# Patient Record
Sex: Female | Born: 1990 | Race: White | Hispanic: No | Marital: Single | Smoking: Never smoker
Health system: Southern US, Community
[De-identification: ages and names within clinical notes are randomized; demographics above are authoritative.]

## PROBLEM LIST (undated history)

## (undated) DIAGNOSIS — L210 Seborrhea capitis: Secondary | ICD-10-CM

## (undated) DIAGNOSIS — I10 Essential (primary) hypertension: Secondary | ICD-10-CM

## (undated) DIAGNOSIS — E663 Overweight: Secondary | ICD-10-CM

## (undated) DIAGNOSIS — F419 Anxiety disorder, unspecified: Secondary | ICD-10-CM

## (undated) DIAGNOSIS — E119 Type 2 diabetes mellitus without complications: Secondary | ICD-10-CM

## (undated) DIAGNOSIS — R7303 Prediabetes: Secondary | ICD-10-CM

## (undated) DIAGNOSIS — T8859XA Other complications of anesthesia, initial encounter: Secondary | ICD-10-CM

## (undated) DIAGNOSIS — M3313 Other dermatomyositis without myopathy: Secondary | ICD-10-CM

## (undated) DIAGNOSIS — F39 Unspecified mood [affective] disorder: Secondary | ICD-10-CM

## (undated) DIAGNOSIS — E039 Hypothyroidism, unspecified: Secondary | ICD-10-CM

## (undated) DIAGNOSIS — IMO0001 Reserved for inherently not codable concepts without codable children: Secondary | ICD-10-CM

## (undated) DIAGNOSIS — R030 Elevated blood-pressure reading, without diagnosis of hypertension: Secondary | ICD-10-CM

## (undated) DIAGNOSIS — Z309 Encounter for contraceptive management, unspecified: Secondary | ICD-10-CM

## (undated) DIAGNOSIS — F79 Unspecified intellectual disabilities: Secondary | ICD-10-CM

## (undated) DIAGNOSIS — Q211 Atrial septal defect, unspecified: Secondary | ICD-10-CM

## (undated) DIAGNOSIS — Q0700 Arnold-Chiari syndrome without spina bifida or hydrocephalus: Secondary | ICD-10-CM

## (undated) DIAGNOSIS — M3390 Dermatopolymyositis, unspecified, organ involvement unspecified: Secondary | ICD-10-CM

## (undated) DIAGNOSIS — R102 Pelvic and perineal pain unspecified side: Secondary | ICD-10-CM

## (undated) DIAGNOSIS — T4145XA Adverse effect of unspecified anesthetic, initial encounter: Secondary | ICD-10-CM

## (undated) HISTORY — DX: Unspecified intellectual disabilities: F79

## (undated) HISTORY — DX: Anxiety disorder, unspecified: F41.9

## (undated) HISTORY — DX: Seborrhea capitis: L21.0

## (undated) HISTORY — DX: Reserved for inherently not codable concepts without codable children: IMO0001

## (undated) HISTORY — DX: Other dermatomyositis without myopathy: M33.13

## (undated) HISTORY — DX: Atrial septal defect: Q21.1

## (undated) HISTORY — DX: Unspecified mood (affective) disorder: F39

## (undated) HISTORY — DX: Overweight: E66.3

## (undated) HISTORY — DX: Encounter for contraceptive management, unspecified: Z30.9

## (undated) HISTORY — DX: Hypothyroidism, unspecified: E03.9

## (undated) HISTORY — DX: Arnold-Chiari syndrome without spina bifida or hydrocephalus: Q07.00

## (undated) HISTORY — DX: Pelvic and perineal pain: R10.2

## (undated) HISTORY — DX: Essential (primary) hypertension: I10

## (undated) HISTORY — DX: Elevated blood-pressure reading, without diagnosis of hypertension: R03.0

## (undated) HISTORY — DX: Type 2 diabetes mellitus without complications: E11.9

## (undated) HISTORY — DX: Prediabetes: R73.03

## (undated) HISTORY — DX: Dermatopolymyositis, unspecified, organ involvement unspecified: M33.90

## (undated) HISTORY — DX: Atrial septal defect, unspecified: Q21.10

## (undated) HISTORY — DX: Pelvic and perineal pain unspecified side: R10.20

## (undated) HISTORY — PX: ASD REPAIR: SHX258

---

## 2009-04-21 ENCOUNTER — Inpatient Hospital Stay: Payer: Self-pay | Admitting: Psychiatry

## 2009-04-26 ENCOUNTER — Emergency Department: Payer: Self-pay | Admitting: Emergency Medicine

## 2009-05-04 ENCOUNTER — Emergency Department: Payer: Self-pay | Admitting: Emergency Medicine

## 2009-06-07 ENCOUNTER — Emergency Department: Payer: Self-pay | Admitting: Unknown Physician Specialty

## 2010-08-05 ENCOUNTER — Emergency Department: Payer: Self-pay | Admitting: Emergency Medicine

## 2010-08-28 ENCOUNTER — Emergency Department: Payer: Self-pay | Admitting: Emergency Medicine

## 2010-10-25 ENCOUNTER — Emergency Department: Payer: Self-pay | Admitting: Emergency Medicine

## 2010-12-14 ENCOUNTER — Inpatient Hospital Stay: Payer: Self-pay | Admitting: Unknown Physician Specialty

## 2010-12-23 ENCOUNTER — Emergency Department: Payer: Self-pay | Admitting: Emergency Medicine

## 2011-01-12 ENCOUNTER — Ambulatory Visit: Payer: Self-pay | Admitting: Internal Medicine

## 2011-02-07 ENCOUNTER — Emergency Department: Payer: Self-pay | Admitting: Emergency Medicine

## 2011-02-09 ENCOUNTER — Emergency Department: Payer: Self-pay | Admitting: Emergency Medicine

## 2011-02-14 ENCOUNTER — Emergency Department: Payer: Self-pay | Admitting: Emergency Medicine

## 2011-02-19 ENCOUNTER — Emergency Department: Payer: Self-pay | Admitting: *Deleted

## 2011-08-02 ENCOUNTER — Inpatient Hospital Stay: Payer: Self-pay | Admitting: Psychiatry

## 2011-08-02 LAB — DRUG SCREEN, URINE

## 2011-08-02 LAB — COMPREHENSIVE METABOLIC PANEL
Albumin: 3.2 g/dL — ABNORMAL LOW (ref 3.4–5.0)
Alkaline Phosphatase: 116 U/L (ref 50–136)
Anion Gap: 10 (ref 7–16)
BUN: 7 mg/dL (ref 7–18)
Bilirubin,Total: 0.2 mg/dL (ref 0.2–1.0)
Calcium, Total: 8.6 mg/dL (ref 8.5–10.1)
Chloride: 104 mmol/L (ref 98–107)
Co2: 25 mmol/L (ref 21–32)
Creatinine: 0.65 mg/dL (ref 0.60–1.30)
EGFR (African American): >60
EGFR (Non-African Amer.): >60
Glucose: 92 mg/dL (ref 65–99)
Osmolality: 275 (ref 275–301)
Potassium: 3.6 mmol/L (ref 3.5–5.1)
SGOT(AST): 18 U/L (ref 15–37)
SGPT (ALT): 24 U/L
Sodium: 139 mmol/L (ref 136–145)
Total Protein: 7.8 g/dL (ref 6.4–8.2)

## 2011-08-02 LAB — ACETAMINOPHEN LEVEL: Acetaminophen: <2 ug/mL

## 2011-08-02 LAB — CBC
HCT: 36 % (ref 35.0–47.0)
HGB: 11.9 g/dL — ABNORMAL LOW (ref 12.0–16.0)
MCH: 27.8 pg (ref 26.0–34.0)
MCHC: 33.1 g/dL (ref 32.0–36.0)
MCV: 84 fL (ref 80–100)
Platelet: 304 10*3/uL (ref 150–440)
RBC: 4.29 10*6/uL (ref 3.80–5.20)
RDW: 14 % (ref 11.5–14.5)
WBC: 8.9 10*3/uL (ref 3.6–11.0)

## 2011-08-02 LAB — ETHANOL
Ethanol %: <0.003 % (ref 0.000–0.080)
Ethanol: <3 mg/dL

## 2011-08-02 LAB — CARBAMAZEPINE LEVEL, TOTAL: Carbamazepine: 12.4 ug/mL (ref 4.0–12.0)

## 2011-08-02 LAB — SALICYLATE LEVEL: Salicylates, Serum: <1.7 mg/dL

## 2011-08-02 LAB — TSH: Thyroid Stimulating Horm: 0.46 u[IU]/mL

## 2011-08-03 LAB — LIPID PANEL
Cholesterol: 153 mg/dL (ref 0–200)
HDL Cholesterol: 42 mg/dL (ref 40–60)
Ldl Cholesterol, Calc: 89 mg/dL (ref 0–100)
Triglycerides: 108 mg/dL (ref 0–200)
VLDL Cholesterol, Calc: 22 mg/dL (ref 5–40)

## 2011-08-03 LAB — HEMOGLOBIN A1C: Hemoglobin A1C: 6.5 % — ABNORMAL HIGH (ref 4.2–6.3)

## 2011-08-03 LAB — FOLATE: Folic Acid: 17.2 ng/mL (ref 3.1–100.0)

## 2011-08-04 ENCOUNTER — Encounter: Payer: Self-pay | Admitting: Cardiovascular Disease

## 2011-08-04 ENCOUNTER — Encounter: Payer: Self-pay | Admitting: *Deleted

## 2011-08-04 DIAGNOSIS — I369 Nonrheumatic tricuspid valve disorder, unspecified: Secondary | ICD-10-CM

## 2011-08-04 LAB — URINALYSIS, COMPLETE
Bilirubin,UR: NEGATIVE
Glucose,UR: NEGATIVE mg/dL (ref 0–75)
Ketone: NEGATIVE
Nitrite: NEGATIVE
Ph: 7 (ref 4.5–8.0)
Protein: NEGATIVE
RBC,UR: 4 /HPF (ref 0–5)
Specific Gravity: 1.013 (ref 1.003–1.030)
Squamous Epithelial: 3
WBC UR: 12 /HPF (ref 0–5)

## 2011-08-05 LAB — T4, FREE: Free Thyroxine: 1.27 ng/dL (ref 0.76–1.46)

## 2011-08-06 LAB — BASIC METABOLIC PANEL
Anion Gap: 11 (ref 7–16)
BUN: 9 mg/dL (ref 7–18)
Calcium, Total: 8.6 mg/dL (ref 8.5–10.1)
Chloride: 95 mmol/L — ABNORMAL LOW (ref 98–107)
Co2: 27 mmol/L (ref 21–32)
Creatinine: 0.68 mg/dL (ref 0.60–1.30)
EGFR (African American): >60
EGFR (Non-African Amer.): >60
Glucose: 114 mg/dL — ABNORMAL HIGH (ref 65–99)
Osmolality: 266 (ref 275–301)
Potassium: 3.2 mmol/L — ABNORMAL LOW (ref 3.5–5.1)
Sodium: 133 mmol/L — ABNORMAL LOW (ref 136–145)

## 2011-08-07 LAB — PROTEIN, URINE, RANDOM: Protein, Random Urine: 12 mg/dL (ref 0–12)

## 2011-08-07 LAB — SODIUM, URINE, RANDOM: Sodium, Urine Random: 80 mmol/L (ref 20–110)

## 2011-08-08 ENCOUNTER — Ambulatory Visit: Payer: Self-pay | Admitting: Cardiovascular Disease

## 2011-08-16 LAB — DRUG SCREEN, URINE

## 2011-08-16 LAB — COMPREHENSIVE METABOLIC PANEL
Albumin: 3.4 g/dL (ref 3.4–5.0)
Alkaline Phosphatase: 107 U/L (ref 50–136)
Anion Gap: 8 (ref 7–16)
BUN: 10 mg/dL (ref 7–18)
Bilirubin,Total: 0.2 mg/dL (ref 0.2–1.0)
Calcium, Total: 8.8 mg/dL (ref 8.5–10.1)
Chloride: 101 mmol/L (ref 98–107)
Co2: 26 mmol/L (ref 21–32)
Creatinine: 0.89 mg/dL (ref 0.60–1.30)
EGFR (African American): >60
EGFR (Non-African Amer.): >60
Glucose: 115 mg/dL — ABNORMAL HIGH (ref 65–99)
Osmolality: 270 (ref 275–301)
Potassium: 3.7 mmol/L (ref 3.5–5.1)
SGOT(AST): 19 U/L (ref 15–37)
SGPT (ALT): 21 U/L
Sodium: 135 mmol/L — ABNORMAL LOW (ref 136–145)
Total Protein: 7.9 g/dL (ref 6.4–8.2)

## 2011-08-16 LAB — URINALYSIS, COMPLETE
Bilirubin,UR: NEGATIVE
Blood: NEGATIVE
Glucose,UR: NEGATIVE mg/dL (ref 0–75)
Hyaline Cast: 1
Ketone: NEGATIVE
Leukocyte Esterase: NEGATIVE
Nitrite: NEGATIVE
Ph: 6 (ref 4.5–8.0)
Protein: 30
RBC,UR: 8 /HPF (ref 0–5)
Specific Gravity: 1.016 (ref 1.003–1.030)
Squamous Epithelial: 1
WBC UR: 5 /HPF (ref 0–5)

## 2011-08-16 LAB — CBC
HCT: 35.2 % (ref 35.0–47.0)
HGB: 11.1 g/dL — ABNORMAL LOW (ref 12.0–16.0)
MCH: 26.6 pg (ref 26.0–34.0)
MCHC: 31.6 g/dL — ABNORMAL LOW (ref 32.0–36.0)
MCV: 84 fL (ref 80–100)
Platelet: 320 10*3/uL (ref 150–440)
RBC: 4.18 10*6/uL (ref 3.80–5.20)
RDW: 14.5 % (ref 11.5–14.5)
WBC: 10.1 10*3/uL (ref 3.6–11.0)

## 2011-08-16 LAB — SALICYLATE LEVEL: Salicylates, Serum: <1.7 mg/dL

## 2011-08-16 LAB — ETHANOL
Ethanol %: <0.003 % (ref 0.000–0.080)
Ethanol: <3 mg/dL

## 2011-08-16 LAB — ACETAMINOPHEN LEVEL: Acetaminophen: <2 ug/mL

## 2011-08-16 LAB — TSH: Thyroid Stimulating Horm: 4.4 u[IU]/mL

## 2011-08-16 LAB — PREGNANCY, URINE: Pregnancy Test, Urine: NEGATIVE m[IU]/mL

## 2011-08-17 ENCOUNTER — Inpatient Hospital Stay: Payer: Self-pay | Admitting: Psychiatry

## 2011-08-17 ENCOUNTER — Ambulatory Visit: Payer: Self-pay | Admitting: Cardiovascular Disease

## 2011-08-17 LAB — CARBAMAZEPINE LEVEL, TOTAL: Carbamazepine: 7.6 ug/mL (ref 4.0–12.0)

## 2011-09-01 ENCOUNTER — Encounter: Payer: Self-pay | Admitting: Cardiovascular Disease

## 2011-09-08 ENCOUNTER — Encounter: Payer: Self-pay | Admitting: Cardiovascular Disease

## 2011-09-14 ENCOUNTER — Ambulatory Visit: Payer: Medicaid Other | Admitting: Cardiovascular Disease

## 2011-09-19 ENCOUNTER — Ambulatory Visit (INDEPENDENT_AMBULATORY_CARE_PROVIDER_SITE_OTHER): Payer: Medicaid Other | Admitting: Cardiovascular Disease

## 2011-09-19 ENCOUNTER — Encounter: Payer: Self-pay | Admitting: Cardiovascular Disease

## 2011-09-19 VITALS — BP 90/60 | HR 76 | Ht 63.0 in | Wt 304.8 lb

## 2011-09-19 DIAGNOSIS — Q211 Atrial septal defect: Secondary | ICD-10-CM

## 2011-09-19 NOTE — Patient Instructions (Signed)
Your physician has requested that you have an echocardiogram. Echocardiography is a painless test that uses sound waves to create images of your heart. It provides your doctor with information about the size and shape of your heart and how well your heart's chambers and valves are working. This procedure takes approximately one hour. There are no restrictions for this procedure.  Follow up as needed 

## 2011-09-19 NOTE — Assessment & Plan Note (Signed)
The patient had a previous atrial septal defect which was corrected surgically at the age of 33. She currently has no cardiac symptoms. Her physical exam does not reveal any cardiac murmurs or extra sounds. Baseline ECG is within normal limits. I recommend an echocardiogram to evaluate for any other structural abnormalities as well as the integrity of the surgical repair of VSD. If the echocardiogram is unremarkable, no further cardiac testing or followup will be needed. The need for endocarditis prophylaxis is based on whether any foreign material was used to repair the defect. Hopefully, the echocardiogram will help [name] in determining that.

## 2011-09-19 NOTE — Progress Notes (Signed)
HPI  This is a 21 year old female who is referred by Dr. Allena Katz for further cardiac evaluation. She has a history of atrial septal defect which was corrected surgically at the age of 4 at [facility]. No cardiac events since then. 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 She denies any chest pain, dyspnea, orthopnea, palpitations, dizziness or syncope. She has not had any recent echocardiogram. She is not aware of any other congenital heart disease. Her previous cardiac records are not available.  Allergies  Allergen Reactions  . Augmentin (Amoxicillin-Pot Clavulanate)      Current Outpatient Prescriptions on File Prior to Visit  Medication Sig Dispense Refill  . acetaminophen (TYLENOL) 325 MG tablet Take 650 mg by mouth every 6 (six) hours as needed.      . carbamazepine (TEGRETOL) 100 MG chewable tablet Chew 100 mg by mouth 3 (three) times daily.      . fluticasone (FLONASE) 50 MCG/ACT nasal spray Place 2 sprays into the nose daily.      [name] gabapentin (NEURONTIN) 600 MG tablet Take 600 mg by mouth 3 (three) times daily.      . hydrochlorothiazide (HYDRODIURIL) 25 MG tablet Take 25 mg by mouth daily.      [name] lisinopril (PRINIVIL,ZESTRIL) 40 MG tablet Take 40 mg by mouth daily.      . Multiple Vitamin (MULTIVITAMIN) capsule Take 1 capsule by mouth daily.      . propranolol (INDERAL) 40 MG tablet Take 40 mg by mouth 2 (two) times daily.      . sertraline (ZOLOFT) 100 MG tablet Take 1 and 1/2 tablet daily for depression.         Past Medical History  Diagnosis Date  . Atrial septal defect   . Pelvic pain   . Elevated blood pressure   . Constipation   . Dandruff   . Hypothyroidism   . Contraception management   . Overweight   . Prediabetes   . Arnold-Chiari malformation   . Mental retardation   . Mood disorder   . Dermatomyositis     age 57     Past Surgical History  Procedure Date  . Asd repair     at age 65     History reviewed.  No pertinent family history.   History   Social History  . Marital Status: Single    Spouse Name: N/A    Number of Children: N/A  . Years of Education: N/A   Occupational History  . Not on file.   Social History Main Topics  . Smoking status: Never Smoker   . Smokeless tobacco: Not on file  . Alcohol Use: No  . Drug Use: No  . Sexually Active: Not on file   Other Topics Concern  . Not on file   Social History Narrative  . No narrative on file     ROS Constitutional: Negative for fever, chills, diaphoresis, activity change, appetite change and fatigue.  HENT: Negative for hearing loss, nosebleeds, congestion, sore throat, facial swelling, drooling, trouble swallowing, neck pain, voice change, sinus pressure and tinnitus.  Eyes: Negative for photophobia, pain, discharge and visual disturbance.  Respiratory: Negative for apnea, cough, chest tightness, shortness of breath and wheezing.  Cardiovascular: Negative for chest pain, palpitations and leg swelling.  Gastrointestinal: Negative for nausea, vomiting, abdominal pain, diarrhea, constipation, blood in stool and abdominal distention.  Genitourinary: Negative for dysuria, urgency, frequency, hematuria and decreased urine volume.  Musculoskeletal: Negative for myalgias, back pain, joint swelling, arthralgias and gait problem.  Skin: Negative for color change, pallor, rash and wound.  Neurological: Negative for dizziness, tremors, seizures, syncope, speech difficulty, weakness, light-headedness, numbness and headaches.  Psychiatric/Behavioral: Negative for suicidal ideas, hallucinations, behavioral problems and agitation. The patient is not nervous/anxious.     PHYSICAL EXAM   BP 90/60  Pulse 76  Ht 5\' 3"  (1.6 m)  Wt 304 lb 12 oz (138.234 kg)  BMI 53.98 kg/m2 Constitutional: She is oriented to person, place, and time. She appears well-developed and well-nourished. No distress.  HENT: No nasal discharge.  Head:  Normocephalic and atraumatic.  Eyes: Pupils are equal and round. Right eye exhibits no discharge. Left eye exhibits no discharge.  Neck: Normal range of motion. Neck supple. No JVD present. No thyromegaly present.  Cardiovascular: Normal rate, regular rhythm, normal heart sounds. Exam reveals no gallop and no friction rub. No murmur heard.  Pulmonary/Chest: Effort normal and breath sounds normal. No stridor. No respiratory distress. She has no wheezes. She has no rales. She exhibits no tenderness.  Abdominal: Soft. Bowel sounds are normal. She exhibits no distension. There is no tenderness. There is no rebound and no guarding.  Musculoskeletal: Normal range of motion. She exhibits no edema and no tenderness.  Neurological: She is alert and oriented to person, place, and time. Coordination normal.  Skin: Skin is warm and dry. No rash noted. She is not diaphoretic. No erythema. No pallor.  Psychiatric: She has a normal mood and affect. Her behavior is normal. Judgment and thought content normal.     EKG: Normal sinus rhythm with no significant ST or T wave changes. No atrial enlargement. Normal QT interval.   ASSESSMENT AND PLAN

## 2011-10-18 ENCOUNTER — Other Ambulatory Visit: Payer: Medicaid Other

## 2011-10-23 ENCOUNTER — Other Ambulatory Visit: Payer: Medicaid Other

## 2011-11-07 ENCOUNTER — Other Ambulatory Visit (INDEPENDENT_AMBULATORY_CARE_PROVIDER_SITE_OTHER): Payer: Medicaid Other

## 2011-11-07 ENCOUNTER — Other Ambulatory Visit: Payer: Self-pay

## 2011-11-07 DIAGNOSIS — Q211 Atrial septal defect: Secondary | ICD-10-CM

## 2012-04-08 ENCOUNTER — Emergency Department: Payer: Self-pay | Admitting: Emergency Medicine

## 2012-04-08 LAB — URINALYSIS, COMPLETE
Bilirubin,UR: NEGATIVE
Glucose,UR: NEGATIVE mg/dL (ref 0–75)
Ketone: NEGATIVE
Nitrite: NEGATIVE
Ph: 6 (ref 4.5–8.0)
Protein: NEGATIVE
RBC,UR: 8 /HPF (ref 0–5)
Specific Gravity: 1.021 (ref 1.003–1.030)
Squamous Epithelial: 2
WBC UR: 6 /HPF (ref 0–5)

## 2012-04-08 LAB — CBC
HCT: 31.7 % — ABNORMAL LOW (ref 35.0–47.0)
HGB: 11.5 g/dL — ABNORMAL LOW (ref 12.0–16.0)
MCH: 31.3 pg (ref 26.0–34.0)
MCHC: 36.4 g/dL — ABNORMAL HIGH (ref 32.0–36.0)
MCV: 86 fL (ref 80–100)
Platelet: 297 10*3/uL (ref 150–440)
RBC: 3.68 10*6/uL — ABNORMAL LOW (ref 3.80–5.20)
RDW: 14.3 % (ref 11.5–14.5)
WBC: 10.6 10*3/uL (ref 3.6–11.0)

## 2012-04-08 LAB — COMPREHENSIVE METABOLIC PANEL
Albumin: 3.5 g/dL (ref 3.4–5.0)
Alkaline Phosphatase: 121 U/L (ref 50–136)
Anion Gap: 7 (ref 7–16)
BUN: 17 mg/dL (ref 7–18)
Bilirubin,Total: 0.2 mg/dL (ref 0.2–1.0)
Calcium, Total: 9.2 mg/dL (ref 8.5–10.1)
Chloride: 103 mmol/L (ref 98–107)
Co2: 28 mmol/L (ref 21–32)
Creatinine: 1.03 mg/dL (ref 0.60–1.30)
EGFR (African American): >60
EGFR (Non-African Amer.): >60
Glucose: 107 mg/dL — ABNORMAL HIGH (ref 65–99)
Osmolality: 278 (ref 275–301)
Potassium: 3.8 mmol/L (ref 3.5–5.1)
SGOT(AST): 22 U/L (ref 15–37)
SGPT (ALT): 26 U/L (ref 12–78)
Sodium: 138 mmol/L (ref 136–145)
Total Protein: 8.1 g/dL (ref 6.4–8.2)

## 2012-04-08 LAB — DRUG SCREEN, URINE

## 2012-04-08 LAB — ETHANOL
Ethanol %: <0.003 % (ref 0.000–0.080)
Ethanol: <3 mg/dL

## 2012-04-08 LAB — PREGNANCY, URINE: Pregnancy Test, Urine: NEGATIVE m[IU]/mL

## 2012-04-08 LAB — TSH: Thyroid Stimulating Horm: 20.4 u[IU]/mL — ABNORMAL HIGH

## 2012-04-08 LAB — SALICYLATE LEVEL: Salicylates, Serum: <1.7 mg/dL

## 2012-04-08 LAB — ACETAMINOPHEN LEVEL: Acetaminophen: <2 ug/mL

## 2012-06-29 ENCOUNTER — Emergency Department: Payer: Self-pay | Admitting: Emergency Medicine

## 2012-09-06 ENCOUNTER — Emergency Department: Payer: Self-pay | Admitting: Emergency Medicine

## 2012-09-06 LAB — ETHANOL
Ethanol %: <0.003 % (ref 0.000–0.080)
Ethanol: <3 mg/dL

## 2012-09-06 LAB — CBC
HCT: 32.6 % — ABNORMAL LOW (ref 35.0–47.0)
HGB: 11.1 g/dL — ABNORMAL LOW (ref 12.0–16.0)
MCH: 28.9 pg (ref 26.0–34.0)
MCHC: 33.9 g/dL (ref 32.0–36.0)
MCV: 85 fL (ref 80–100)
Platelet: 358 10*3/uL (ref 150–440)
RBC: 3.83 10*6/uL (ref 3.80–5.20)
RDW: 14.1 % (ref 11.5–14.5)
WBC: 9.3 10*3/uL (ref 3.6–11.0)

## 2012-09-06 LAB — COMPREHENSIVE METABOLIC PANEL
Albumin: 3.7 g/dL (ref 3.4–5.0)
Alkaline Phosphatase: 116 U/L (ref 50–136)
Anion Gap: 5 — ABNORMAL LOW (ref 7–16)
BUN: 11 mg/dL (ref 7–18)
Bilirubin,Total: 0.2 mg/dL (ref 0.2–1.0)
Calcium, Total: 9.5 mg/dL (ref 8.5–10.1)
Chloride: 102 mmol/L (ref 98–107)
Co2: 29 mmol/L (ref 21–32)
Creatinine: 0.93 mg/dL (ref 0.60–1.30)
EGFR (African American): >60
EGFR (Non-African Amer.): >60
Glucose: 130 mg/dL — ABNORMAL HIGH (ref 65–99)
Osmolality: 273 (ref 275–301)
Potassium: 3.6 mmol/L (ref 3.5–5.1)
SGOT(AST): 26 U/L (ref 15–37)
SGPT (ALT): 27 U/L (ref 12–78)
Sodium: 136 mmol/L (ref 136–145)
Total Protein: 8.4 g/dL — ABNORMAL HIGH (ref 6.4–8.2)

## 2012-09-06 LAB — URINALYSIS, COMPLETE
Bilirubin,UR: NEGATIVE
Glucose,UR: NEGATIVE mg/dL (ref 0–75)
Ketone: NEGATIVE
Nitrite: NEGATIVE
Ph: 8 (ref 4.5–8.0)
Protein: NEGATIVE
RBC,UR: 7 /HPF (ref 0–5)
Specific Gravity: 1.011 (ref 1.003–1.030)
Squamous Epithelial: 4
WBC UR: 3 /HPF (ref 0–5)

## 2012-09-06 LAB — DRUG SCREEN, URINE

## 2012-09-06 LAB — TSH: Thyroid Stimulating Horm: 7.46 u[IU]/mL — ABNORMAL HIGH

## 2012-09-10 ENCOUNTER — Emergency Department: Payer: Self-pay | Admitting: Emergency Medicine

## 2012-09-10 LAB — COMPREHENSIVE METABOLIC PANEL
Albumin: 3.9 g/dL (ref 3.4–5.0)
Alkaline Phosphatase: 114 U/L (ref 50–136)
Anion Gap: 8 (ref 7–16)
BUN: 13 mg/dL (ref 7–18)
Bilirubin,Total: 0.4 mg/dL (ref 0.2–1.0)
Calcium, Total: 9.6 mg/dL (ref 8.5–10.1)
Chloride: 99 mmol/L (ref 98–107)
Co2: 28 mmol/L (ref 21–32)
Creatinine: 1.01 mg/dL (ref 0.60–1.30)
EGFR (African American): >60
EGFR (Non-African Amer.): >60
Glucose: 135 mg/dL — ABNORMAL HIGH (ref 65–99)
Osmolality: 272 (ref 275–301)
Potassium: 3.8 mmol/L (ref 3.5–5.1)
SGOT(AST): 35 U/L (ref 15–37)
SGPT (ALT): 42 U/L (ref 12–78)
Sodium: 135 mmol/L — ABNORMAL LOW (ref 136–145)
Total Protein: 8.8 g/dL — ABNORMAL HIGH (ref 6.4–8.2)

## 2012-09-10 LAB — CBC
HCT: 35.1 % (ref 35.0–47.0)
HGB: 11.6 g/dL — ABNORMAL LOW (ref 12.0–16.0)
MCH: 28 pg (ref 26.0–34.0)
MCHC: 33 g/dL (ref 32.0–36.0)
MCV: 85 fL (ref 80–100)
Platelet: 343 10*3/uL (ref 150–440)
RBC: 4.15 10*6/uL (ref 3.80–5.20)
RDW: 14 % (ref 11.5–14.5)
WBC: 11.7 10*3/uL — ABNORMAL HIGH (ref 3.6–11.0)

## 2012-09-10 LAB — URINALYSIS, COMPLETE
Bacteria: NONE SEEN
Bilirubin,UR: NEGATIVE
Blood: NEGATIVE
Glucose,UR: NEGATIVE mg/dL (ref 0–75)
Hyaline Cast: 9
Ketone: NEGATIVE
Nitrite: NEGATIVE
Ph: 5 (ref 4.5–8.0)
Protein: NEGATIVE
RBC,UR: 15 /HPF (ref 0–5)
Specific Gravity: 1.024 (ref 1.003–1.030)
Squamous Epithelial: 3
WBC UR: 6 /HPF (ref 0–5)

## 2012-09-10 LAB — DRUG SCREEN, URINE

## 2012-09-10 LAB — ETHANOL
Ethanol %: <0.003 % (ref 0.000–0.080)
Ethanol: <3 mg/dL

## 2012-09-10 LAB — CARBAMAZEPINE LEVEL, TOTAL: Carbamazepine: 7.7 ug/mL (ref 4.0–12.0)

## 2012-09-10 LAB — TSH: Thyroid Stimulating Horm: 17.3 u[IU]/mL — ABNORMAL HIGH

## 2012-09-15 ENCOUNTER — Emergency Department: Payer: Self-pay | Admitting: Emergency Medicine

## 2012-09-15 LAB — MAGNESIUM: Magnesium: 1.6 mg/dL — ABNORMAL LOW

## 2012-09-15 LAB — COMPREHENSIVE METABOLIC PANEL
Albumin: 3.6 g/dL (ref 3.4–5.0)
Alkaline Phosphatase: 106 U/L (ref 50–136)
Anion Gap: 7 (ref 7–16)
BUN: 7 mg/dL (ref 7–18)
Bilirubin,Total: 0.2 mg/dL (ref 0.2–1.0)
Calcium, Total: 9 mg/dL (ref 8.5–10.1)
Chloride: 101 mmol/L (ref 98–107)
Co2: 29 mmol/L (ref 21–32)
Creatinine: 0.97 mg/dL (ref 0.60–1.30)
EGFR (African American): >60
EGFR (Non-African Amer.): >60
Glucose: 176 mg/dL — ABNORMAL HIGH (ref 65–99)
Osmolality: 276 (ref 275–301)
Potassium: 2.9 mmol/L — ABNORMAL LOW (ref 3.5–5.1)
SGOT(AST): 20 U/L (ref 15–37)
SGPT (ALT): 31 U/L (ref 12–78)
Sodium: 137 mmol/L (ref 136–145)
Total Protein: 8 g/dL (ref 6.4–8.2)

## 2012-09-15 LAB — CBC
HCT: 33.2 % — ABNORMAL LOW (ref 35.0–47.0)
HGB: 11.2 g/dL — ABNORMAL LOW (ref 12.0–16.0)
MCH: 28.7 pg (ref 26.0–34.0)
MCHC: 33.9 g/dL (ref 32.0–36.0)
MCV: 85 fL (ref 80–100)
Platelet: 297 10*3/uL (ref 150–440)
RBC: 3.92 10*6/uL (ref 3.80–5.20)
RDW: 14 % (ref 11.5–14.5)
WBC: 7.6 10*3/uL (ref 3.6–11.0)

## 2012-09-15 LAB — ETHANOL
Ethanol %: <0.003 % (ref 0.000–0.080)
Ethanol: <3 mg/dL

## 2012-09-15 LAB — CARBAMAZEPINE LEVEL, TOTAL: Carbamazepine: 8.2 ug/mL (ref 4.0–12.0)

## 2012-09-15 LAB — TSH: Thyroid Stimulating Horm: 5.61 u[IU]/mL — ABNORMAL HIGH

## 2012-09-23 ENCOUNTER — Emergency Department: Payer: Self-pay | Admitting: Emergency Medicine

## 2012-09-23 LAB — COMPREHENSIVE METABOLIC PANEL
Albumin: 3.4 g/dL (ref 3.4–5.0)
Alkaline Phosphatase: 101 U/L (ref 50–136)
Anion Gap: 9 (ref 7–16)
BUN: 10 mg/dL (ref 7–18)
Bilirubin,Total: 0.2 mg/dL (ref 0.2–1.0)
Calcium, Total: 9.2 mg/dL (ref 8.5–10.1)
Chloride: 103 mmol/L (ref 98–107)
Co2: 24 mmol/L (ref 21–32)
Creatinine: 0.84 mg/dL (ref 0.60–1.30)
EGFR (African American): >60
EGFR (Non-African Amer.): >60
Glucose: 143 mg/dL — ABNORMAL HIGH (ref 65–99)
Osmolality: 273 (ref 275–301)
Potassium: 3.2 mmol/L — ABNORMAL LOW (ref 3.5–5.1)
SGOT(AST): 23 U/L (ref 15–37)
SGPT (ALT): 26 U/L (ref 12–78)
Sodium: 136 mmol/L (ref 136–145)
Total Protein: 8 g/dL (ref 6.4–8.2)

## 2012-09-23 LAB — ETHANOL
Ethanol %: <0.003 % (ref 0.000–0.080)
Ethanol: <3 mg/dL

## 2012-09-23 LAB — CBC
HCT: 33.8 % — ABNORMAL LOW (ref 35.0–47.0)
HGB: 11.2 g/dL — ABNORMAL LOW (ref 12.0–16.0)
MCH: 28 pg (ref 26.0–34.0)
MCHC: 33.2 g/dL (ref 32.0–36.0)
MCV: 84 fL (ref 80–100)
Platelet: 310 10*3/uL (ref 150–440)
RBC: 4 10*6/uL (ref 3.80–5.20)
RDW: 14.3 % (ref 11.5–14.5)
WBC: 9.4 10*3/uL (ref 3.6–11.0)

## 2012-09-23 LAB — TSH: Thyroid Stimulating Horm: 6.68 u[IU]/mL — ABNORMAL HIGH

## 2012-09-24 LAB — URINALYSIS, COMPLETE
Bacteria: NONE SEEN
Bilirubin,UR: NEGATIVE
Blood: NEGATIVE
Glucose,UR: NEGATIVE mg/dL (ref 0–75)
Ketone: NEGATIVE
Leukocyte Esterase: NEGATIVE
Nitrite: NEGATIVE
Ph: 6 (ref 4.5–8.0)
Protein: NEGATIVE
RBC,UR: 7 /HPF (ref 0–5)
Specific Gravity: 1.019 (ref 1.003–1.030)
Squamous Epithelial: 1
WBC UR: 2 /HPF (ref 0–5)

## 2012-09-24 LAB — DRUG SCREEN, URINE

## 2012-10-18 ENCOUNTER — Other Ambulatory Visit: Payer: Self-pay | Admitting: Physician Assistant

## 2012-10-18 LAB — CBC WITH DIFFERENTIAL/PLATELET
Basophil #: 0 10*3/uL (ref 0.0–0.1)
Basophil %: 0.3 %
Eosinophil #: 0.1 10*3/uL (ref 0.0–0.7)
Eosinophil %: 0.5 %
HCT: 32.9 % — ABNORMAL LOW (ref 35.0–47.0)
HGB: 11 g/dL — ABNORMAL LOW (ref 12.0–16.0)
Lymphocyte #: 1.5 10*3/uL (ref 1.0–3.6)
Lymphocyte %: 12 %
MCH: 28.1 pg (ref 26.0–34.0)
MCHC: 33.4 g/dL (ref 32.0–36.0)
MCV: 84 fL (ref 80–100)
Monocyte #: 0.7 x10 3/mm (ref 0.2–0.9)
Monocyte %: 5.2 %
Neutrophil #: 10.5 10*3/uL — ABNORMAL HIGH (ref 1.4–6.5)
Neutrophil %: 82 %
Platelet: 319 10*3/uL (ref 150–440)
RBC: 3.91 10*6/uL (ref 3.80–5.20)
RDW: 13.6 % (ref 11.5–14.5)
WBC: 12.8 10*3/uL — ABNORMAL HIGH (ref 3.6–11.0)

## 2012-10-18 LAB — COMPREHENSIVE METABOLIC PANEL
Albumin: 3.5 g/dL (ref 3.4–5.0)
Alkaline Phosphatase: 98 U/L (ref 50–136)
Anion Gap: 5 — ABNORMAL LOW (ref 7–16)
BUN: 14 mg/dL (ref 7–18)
Bilirubin,Total: 0.2 mg/dL (ref 0.2–1.0)
Calcium, Total: 9.4 mg/dL (ref 8.5–10.1)
Chloride: 105 mmol/L (ref 98–107)
Co2: 27 mmol/L (ref 21–32)
Creatinine: 1.33 mg/dL — ABNORMAL HIGH (ref 0.60–1.30)
EGFR (African American): >60
EGFR (Non-African Amer.): 56 — ABNORMAL LOW
Glucose: 133 mg/dL — ABNORMAL HIGH (ref 65–99)
Osmolality: 276 (ref 275–301)
Potassium: 3.9 mmol/L (ref 3.5–5.1)
SGOT(AST): 18 U/L (ref 15–37)
SGPT (ALT): 30 U/L (ref 12–78)
Sodium: 137 mmol/L (ref 136–145)
Total Protein: 8.1 g/dL (ref 6.4–8.2)

## 2012-10-18 LAB — CARBAMAZEPINE LEVEL, TOTAL: Carbamazepine: 7.2 ug/mL (ref 4.0–12.0)

## 2012-10-18 LAB — TSH: Thyroid Stimulating Horm: 1.74 u[IU]/mL

## 2012-10-18 LAB — HEMOGLOBIN A1C: Hemoglobin A1C: 6.5 % — ABNORMAL HIGH (ref 4.2–6.3)

## 2012-10-23 ENCOUNTER — Emergency Department: Payer: Self-pay | Admitting: Internal Medicine

## 2012-11-05 ENCOUNTER — Ambulatory Visit (INDEPENDENT_AMBULATORY_CARE_PROVIDER_SITE_OTHER): Payer: Medicaid Other | Admitting: Cardiovascular Disease

## 2012-11-05 ENCOUNTER — Encounter: Payer: Self-pay | Admitting: Cardiovascular Disease

## 2012-11-05 VITALS — BP 110/72 | HR 81 | Ht 63.0 in | Wt 311.8 lb

## 2012-11-05 DIAGNOSIS — R079 Chest pain, unspecified: Secondary | ICD-10-CM

## 2012-11-05 DIAGNOSIS — R42 Dizziness and giddiness: Secondary | ICD-10-CM

## 2012-11-05 DIAGNOSIS — R0602 Shortness of breath: Secondary | ICD-10-CM

## 2012-11-05 DIAGNOSIS — Q211 Atrial septal defect: Secondary | ICD-10-CM

## 2012-11-05 DIAGNOSIS — Q2111 Secundum atrial septal defect: Secondary | ICD-10-CM

## 2012-11-05 NOTE — Assessment & Plan Note (Signed)
Her chest pain has been continuous for a few weeks and is not exertional. The etiology of this is not entirely clear but I don't think it is cardiac in nature. Her cardiac exam is unremarkable. Recent ECG was reviewed. There was an isolated Q wave in 3 which can be a normal variant. There was nonspecific T wave changes in the inferior leads. Current ECG is normal. She had an echocardiogram done last year which was overall unremarkable. She has no significant risk factors for coronary artery disease. The utility of stress testing would be very low in this situation. Consider pulmonary evaluation. She did have some reproducible chest pain on physical exam and I think it might be reasonable to use short-term NSAIDs.

## 2012-11-05 NOTE — Progress Notes (Signed)
HPI  This is a 22 year old female who is here today for a followup visit and evaluation of chest pain. She has a history of atrial septal defect which was corrected surgically at the age of 4 at [facility]. No cardiac events since then. 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 She was seen last year by me and reported no cardiac symptoms. She underwent an echocardiogram which showed normal LV systolic function, mild mitral regurgitation and evidence of surgically corrected ASD without evidence of shunting. There was no evidence of pulmonary hypertension. She started having substernal chest tightness of her last few weeks which has been continuous since it started. It is happening at rest and does not worsen with physical activities. She had few episodes of dizziness and went to the emergency room for these complaints. EKG showed an isolated Q wave in lead 3 and nonspecific T wave changes in leads 3 and aVF. She denies any heartburn. She does complain of dyspnea. She was given an inhaler with no significant improvement in her symptoms.  Allergies  Allergen Reactions  . Augmentin (Amoxicillin-Pot Clavulanate)      Current Outpatient Prescriptions on File Prior to Visit  Medication Sig Dispense Refill  . acetaminophen (TYLENOL) 325 MG tablet Take 650 mg by mouth every 6 (six) hours as needed.      [name] alum & mag hydroxide-simeth (MYLANTA) 200-200-20 MG/5ML suspension Take by mouth every 4 (four) hours as needed.      . carbamazepine (TEGRETOL) 100 MG chewable tablet Chew 100 mg by mouth 3 (three) times daily.      . fluticasone (FLONASE) 50 MCG/ACT nasal spray Place 2 sprays into the nose daily.      [name] gabapentin (NEURONTIN) 600 MG tablet Take 300 mg by mouth 3 (three) times daily.       . hydrochlorothiazide (HYDRODIURIL) 25 MG tablet Take 25 mg by mouth daily.      [name] lisinopril (PRINIVIL,ZESTRIL) 40 MG tablet Take 40 mg by mouth daily.      [name] LORazepam  (ATIVAN) 2 MG tablet Take 2 mg by mouth every 6 (six) hours as needed.      . magnesium hydroxide (MILK OF MAGNESIA) 400 MG/5ML suspension Take 5 mLs by mouth daily as needed.      . Multiple Vitamin (MULTIVITAMIN) capsule Take 1 capsule by mouth daily.      . paliperidone (INVEGA) 6 MG 24 hr tablet Take 6 mg by mouth at bedtime.      . propranolol (INDERAL) 40 MG tablet Take 40 mg by mouth 2 (two) times daily.      . sertraline (ZOLOFT) 100 MG tablet Take 1 and 1/2 tablet daily for depression.       No current facility-administered medications on file prior to visit.     Past Medical History  Diagnosis Date  . Atrial septal defect   . Pelvic pain   . Elevated blood pressure   . Constipation   . Dandruff   . Hypothyroidism   . Contraception management   . Overweight(278.02)   . Prediabetes   . Arnold-Chiari malformation   . Mental retardation   . Mood disorder   . Dermatomyositis     age 12     Past Surgical History  Procedure Laterality Date  . Asd repair      at age 60     History reviewed. No pertinent family history.   History  Social History  . Marital Status: Single    Spouse Name: N/A    Number of Children: N/A  . Years of Education: N/A   Occupational History  . Not on file.   Social History Main Topics  . Smoking status: Never Smoker   . Smokeless tobacco: Not on file  . Alcohol Use: No  . Drug Use: No  . Sexually Active: Not on file   Other Topics Concern  . Not on file   Social History Narrative  . No narrative on file        PHYSICAL EXAM   BP 110/72  Pulse 81  Ht 5\' 3"  (1.6 m)  Wt 311 lb 12 oz (141.409 kg)  BMI 55.24 kg/m2 Constitutional: She is oriented to person, place, and time. She appears well-developed and well-nourished. No distress.  HENT: No nasal discharge.  Head: Normocephalic and atraumatic.  Eyes: Pupils are equal and round. Right eye exhibits no discharge. Left eye exhibits no discharge.  Neck: Normal range of  motion. Neck supple. No JVD present. No thyromegaly present.  Cardiovascular: Normal rate, regular rhythm, normal heart sounds. Exam reveals no gallop and no friction rub. No murmur heard.  Pulmonary/Chest: Effort normal and breath sounds normal. No stridor. No respiratory distress. She has no wheezes. She has no rales. She exhibits no tenderness.  Abdominal: Soft. Bowel sounds are normal. She exhibits no distension. There is no tenderness. There is no rebound and no guarding.  Musculoskeletal: Normal range of motion. She exhibits no edema and no tenderness.  Neurological: She is alert and oriented to person, place, and time. Coordination normal.  Skin: Skin is warm and dry. No rash noted. She is not diaphoretic. No erythema. No pallor.  Psychiatric: She has a normal mood and affect. Her behavior is normal. Judgment and thought content normal.     EKG: Normal sinus rhythm with no significant ST or T wave changes. No atrial enlargement. Normal QT interval.   ASSESSMENT AND PLAN

## 2012-11-05 NOTE — Patient Instructions (Addendum)
Continue same medications  Follow up in 1 year

## 2012-11-05 NOTE — Assessment & Plan Note (Signed)
Echocardiogram last year showed no residual defect and no significant abnormalities.

## 2013-07-10 ENCOUNTER — Emergency Department: Payer: Self-pay | Admitting: Emergency Medicine

## 2013-07-10 LAB — CBC
HCT: 35.4 % (ref 35.0–47.0)
HGB: 11.4 g/dL — ABNORMAL LOW (ref 12.0–16.0)
MCH: 28.3 pg (ref 26.0–34.0)
MCHC: 32.4 g/dL (ref 32.0–36.0)
MCV: 87 fL (ref 80–100)
Platelet: 279 10*3/uL (ref 150–440)
RBC: 4.05 10*6/uL (ref 3.80–5.20)
RDW: 14.2 % (ref 11.5–14.5)
WBC: 8.5 10*3/uL (ref 3.6–11.0)

## 2013-07-10 LAB — URINALYSIS, COMPLETE
Bacteria: NONE SEEN
Bilirubin,UR: NEGATIVE
Glucose,UR: NEGATIVE mg/dL (ref 0–75)
Ketone: NEGATIVE
Leukocyte Esterase: NEGATIVE
Nitrite: NEGATIVE
Ph: 5 (ref 4.5–8.0)
Protein: NEGATIVE
RBC,UR: 1 /HPF (ref 0–5)
Specific Gravity: 1.013 (ref 1.003–1.030)
Squamous Epithelial: 1
WBC UR: 2 /HPF (ref 0–5)

## 2013-07-10 LAB — COMPREHENSIVE METABOLIC PANEL
Albumin: 3.4 g/dL (ref 3.4–5.0)
Alkaline Phosphatase: 98 U/L
Anion Gap: 6 — ABNORMAL LOW (ref 7–16)
BUN: 8 mg/dL (ref 7–18)
Bilirubin,Total: 0.2 mg/dL (ref 0.2–1.0)
Calcium, Total: 8.9 mg/dL (ref 8.5–10.1)
Chloride: 102 mmol/L (ref 98–107)
Co2: 24 mmol/L (ref 21–32)
Creatinine: 0.65 mg/dL (ref 0.60–1.30)
EGFR (African American): >60
EGFR (Non-African Amer.): >60
Glucose: 102 mg/dL — ABNORMAL HIGH (ref 65–99)
Osmolality: 263 (ref 275–301)
Potassium: 3.6 mmol/L (ref 3.5–5.1)
SGOT(AST): 22 U/L (ref 15–37)
SGPT (ALT): 25 U/L (ref 12–78)
Sodium: 132 mmol/L — ABNORMAL LOW (ref 136–145)
Total Protein: 7.9 g/dL (ref 6.4–8.2)

## 2013-07-10 LAB — PREGNANCY, URINE: Pregnancy Test, Urine: NEGATIVE m[IU]/mL

## 2013-10-25 ENCOUNTER — Emergency Department: Payer: Self-pay | Admitting: Emergency Medicine

## 2013-10-25 LAB — BASIC METABOLIC PANEL
Anion Gap: 8 (ref 7–16)
BUN: 6 mg/dL — ABNORMAL LOW (ref 7–18)
Calcium, Total: 9.2 mg/dL (ref 8.5–10.1)
Chloride: 92 mmol/L — ABNORMAL LOW (ref 98–107)
Co2: 27 mmol/L (ref 21–32)
Creatinine: 0.82 mg/dL (ref 0.60–1.30)
EGFR (African American): >60
EGFR (Non-African Amer.): >60
Glucose: 103 mg/dL — ABNORMAL HIGH (ref 65–99)
Osmolality: 253 (ref 275–301)
Potassium: 3.5 mmol/L (ref 3.5–5.1)
Sodium: 127 mmol/L — ABNORMAL LOW (ref 136–145)

## 2013-10-25 LAB — TROPONIN I: Troponin-I: <0.02 ng/mL

## 2013-10-25 LAB — CBC
HCT: 34.9 % — ABNORMAL LOW (ref 35.0–47.0)
HGB: 11.6 g/dL — ABNORMAL LOW (ref 12.0–16.0)
MCH: 28.8 pg (ref 26.0–34.0)
MCHC: 33.3 g/dL (ref 32.0–36.0)
MCV: 87 fL (ref 80–100)
Platelet: 340 10*3/uL (ref 150–440)
RBC: 4.04 10*6/uL (ref 3.80–5.20)
RDW: 14.7 % — ABNORMAL HIGH (ref 11.5–14.5)
WBC: 9.5 10*3/uL (ref 3.6–11.0)

## 2013-11-23 ENCOUNTER — Emergency Department: Payer: Self-pay | Admitting: Emergency Medicine
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
## 2014-08-21 NOTE — Consult Note (Signed)
PATIENT NAME:  Dawn Lambert, Dawn Lambert MR#:  [id number] DATE OF BIRTH:  09-20-1990  DATE OF CONSULTATION:    REFERRING PHYSICIAN:  Doralee AlbinoLinda M. Ladona Ridgelaylor, MD CONSULTING PHYSICIAN:  Ardeen FillersUzma S. Garnetta BuddyFaheem, MD  REASON FOR CONSULTATION: "I want to go to a new group home."   HISTORY OF PRESENT ILLNESS: The patient is a 24 year old single Caucasian female who presented to the ED by the group home staff named Dawn Lambert. The patient was complaining of putting some shaving cream on her hand and putting her hand inside her rectum. She reported that she also tried to put her hair inside her vagina. She stated that she is trying to put the stuff inside her rectum and her vagina as she is trying to kill herself. She states that she is tired of living at the same group home and wants to go to another group home. Reported that she is bored and there is nothing to do over there.   During my interview, the patient stated that she is still feeling suicidal and she wants to put stuff  inside her rectum and her vagina, as somebody has told her that if she will do things like that, then she would bleed herself to death. The patient was recently evaluated in the ED and at that time, she was calm and was discharged home. Reported that she is not happy at her current group home, and she has been having issues there and she just wants to leave the place. The group home staff stated that Dawn Lambert came to them and told them that she is placing things inside her vagina including shaving cream, hair balls, and she has a UTI. They called the manager and Miss Dawn Lambert came and brought her to the ER. There are only 2 residents in the house, Dawn Lambert and an  older lady, and each lady has her own room. No one is sharing the room, but Dawn Lambert continues to feel frustrated. [facility] was called at 11:50 p.m. and when they evaluated the patient, they decided that she does not meet the criteria for inpatient admission. However, during my interview, the patient remains suicidal and  she stated that she is going to do things to harm herself. She was unable to contract for safety.   PAST PSYCHIATRIC HISTORY: The patient has a history of multiple hospitalizations in the past including at [facility], [facility] and [facility]. She used to go [facility] [place] Currently [facility] is [facility]. She also has history of self-injurious behavior in the past. She has been tried on numerous psychotropic medications in the past including Tegretol, Ativan Zoloft, Invega and Seroquel. Her new psychiatrist has gradually discontinued many of her psychotropic medications.   FAMILY PSYCHIATRIC HISTORY: None.   PAST MEDICAL HISTORY: 1.  Obesity.  2.  Diabetes.  3.  Open heart surgery at the age of 4 for ASD. 4.  Hypothyroidism. 5.  Chiari malformation.   ALLERGIES: No known drug allergies.   CURRENT MEDICATIONS:  1.  Tegretol 100 mg in the morning, 100 mg at noon and 200 mg at night.  2.  Neurontin 300 mg t.i.d.  3.  Hydrochlorothiazide 25 mg daily.  4.  Lisinopril 40 mg daily.  5.  Lorazepam 2 mg at bedtime.  6.  Invega ER 6 mg at bedtime.  7.  Propranolol 40 mg twice daily.  8.  Zyprexa 150 mg at bedtime.  9.  Zoloft 150 mg daily.   SOCIAL HISTORY:  The patient currently lives at the [facility]. Her mother, who is her guardian, lives in [place]. The patient failed numerous placements in the past, but her wish is still to go to another group home where her guardian lives.   REVIEW OF SYSTEMS:  CONSTITUTIONAL: The patient currently denies any fever or chills. No weight changes.  EYES: No double or blurred vision.  EARS, NOSE, THROAT: No hearing loss.  RESPIRATORY: No shortness of breath or cough.  CARDIOVASCULAR: No chest pain or orthopnea.  GASTROINTESTINAL: No abdominal pain, nausea, vomiting or diarrhea.  GENITOURINARY: No incontinence or frequency.  ENDOCRINE: No heat or cold intolerance.  LYMPHATIC: No anemia or  easy bruising.  INTEGUMENTARY: No acne or rash.  MUSCULOSKELETAL: No muscle or joint pain.  NEUROLOGIC: No tingling or weakness.   VITAL SIGNS: Temperature 98.2, pulse 81, respirations 18, blood pressure 121/69.   LABORATORY DATA: Glucose 143, BUN 10, creatinine 0.84, sodium 136, potassium 3.2, chloride 103 bicarbonate 24, anion gap 9, osmolality 273. Blood alcohol less than 3. Protein 8.0, albumin 3.4, bilirubin 0.2, alkaline phosphatase 101, AST 23, ALT 26. TSH 6.68. Urine drug screen is negative. WBC 9.4, RBC 4.0, hemoglobin 11.2, hematocrit 33.8, platelet count 310, MCV 84, MCH 28, MCHC 33.2, RDW 14.3.   MENTAL STATUS EXAMINATION: The patient is a short-statured female who was sitting in the bed. She was alert, oriented to time, place and person. She was pleasant and polite. She was marginally groomed with strong body odor. She maintained fair eye contact. Her speech was low in tone and volume. Mood was depressed. Affect was anxious. Thought process was tangential. Though content: She was unable to contract for safety and reported that she is going to harm herself by putting stuff inside her rectum and vagina. Her cognition and remains intact. She demonstrated poor insight and judgment.   SUICIDE RISK ASSESSMENT: The patient has a long history of depression, anxiety and mood instability, cognitive problems and limited coping skills, who comes to the hospital threatening her  roommate in the context of recent  medication changes. She is unable to contract for safety at this time.   DIAGNOSTIC IMPRESSION: AXIS I: Schizoaffective disorder, bipolar type.  AXIS II: Moderate mental retardation.  AXIS III: Diabetes, hypothyroidism, hypertension, open heart surgery at the age of 4, Chiari malformation.  AXIS IV: Severe mental illness, limited cognitive capacity, inadequate coping skills and family support, and conflict with roommate and legal issues.  AXIS V: Current Global Assessment of Functioning:  25.  TREATMENT PLAN: 1.  The patient will be admitted to the inpatient behavioral health unit for stabilization and safety.  2.  I will continue her medications as prescribed and will titrate the Seroquel to 100 mg at bedtime. She will be monitored by the treatment team and will have her medications adjusted.  Thank you for allowing me to participate in the care of this patient.   ____________________________ Ardeen Fillers. Garnetta Buddy, MD usf:jm D: 09/24/2012 16:38:57 ET T: 09/24/2012 17:23:48 ET JOB#: [id number]  cc: Ardeen Fillers. Garnetta Buddy, MD, <Dictator> Rhunette Croft MD ELECTRONICALLY SIGNED 09/26/2012 14:26

## 2014-08-23 NOTE — Consult Note (Signed)
Assessment/Plan:  Assessment/Plan:   Assessment 24 y/o female with schizoaffective disorder, has uncontrolled HTN, border line DM, and pedal edema, and hypothyroid.    Plan HTN: start HCTZ htpothyroid: TSH 0.46, decrease synthroid and check tsh in $ weeks, check free T4 in am. Border line DM: hgbaic 6.5 , monitor accuchecks and start metformin if needed.   Electronic Signatures: Dayvin Aber, Leana Roeawood S (MD)  (Signed 05-Apr-13 08:41)  Authored: Assessment/Plan   Last Updated: 05-Apr-13 08:41 by Starleen ArmsElgergawy, Sagan Wurzel S (MD)

## 2014-08-23 NOTE — Consult Note (Signed)
PATIENT NAME:  Dawn BlancoFRANCIS, Dawn MR#:  [id number] DATE OF BIRTH:  1990/08/08  DATE OF CONSULTATION:  08/04/2011  REFERRING PHYSICIAN:   CONSULTING PHYSICIAN:  Starleen Armsawood S. Donn Zanetti, MD  REASON FOR CONSULTATION: Hypertension and pedal edema.   HISTORY OF PRESENT ILLNESS: [name] is a 24 year old female from [facility] for the past three years. The patient has history of schizoaffective disorder as well as mild mental retardation, questionable history of Chiari malformation, borderline diabetes, history of open heart surgery at the age of [age], history of dermatomyositis, history of hypothyroidism, and morbid obesity. The patient [facility] and consult was called for uncontrolled blood pressure of the patient running systolic into 180's to 150's as well secondary to mild pedal edema. By reviewing the patient's history, she has never had history of hypertension but she runs hypertensive with initial reading of 185/91; repeat 152/90. The patient wasn't on any hypertension medication before. As well, the patient has hemoglobin A1c level of 6.1. She was diagnosed with borderline diabetes but she was never officially diagnosed with type II diabetes. As well, the patient reports she does have lower extremity edema even though it was minimally observed on the physical exam.   By reviewing the patient's labs as well, she has TSH level of 0.46 which is almost borderline low. The patient had thyroid studies done in 2010 with thyroid scan done which showed increased uptake and ultrasound showing left side goiter. The patient reports her medication has been recently adjusted for her hypothyroidism.   PAST MEDICAL HISTORY: 1. History of schizoaffective disorder. 2. Mild mental retardation. 3. Borderline diabetes. 4. Questionable Chiari malformation. 5. Open heart surgery at the age of 4. 6. Dermatomyositis. 7. Hypothyroidism. 8. Obesity. 9. No history  of any seizures.  FAMILY HISTORY: The patient denies any history of any mental illness or substance abuse in the family.  SOCIAL HISTORY: No smoking. No illicit drug abuse. The patient lives at a group home for the last three years.   CURRENT MEDICATIONS: 1. Tylenol 650 as needed every four hours. 2. Maalox as needed 30 mL every four hours. 3. Fluticasone nasal spray two sprays in nostrils daily. 4. Neurontin 600 mg p.o. t.i.d. 5. Synthroid 0.175 mg oral daily. 6. Ativan 2 mg oral b.i.d. 7. Magnesium oxide 30 mL as needed for constipation. 8. Multivitamin one capsule daily. 9. Sertraline 100 mg oral daily. 10. Seroquel 50 mg oral daily. 11. Sertraline 150 mg oral daily.  ALLERGIES: No known drug allergies.   REVIEW OF SYSTEMS: The patient denies any weight loss, fever or chills. EYES: Denies any blurry vision, eye pain, discharge. EARS, NOSE, AND THROAT: Denies any sore throat, tinnitus, runny nose. RESPIRATORY: Denies any shortness of breath, cough, hemoptysis. CARDIOVASCULAR: Denies any chest pain, paroxysmal nocturnal dyspnea, palpitations. Has complaint of mild pedal edema. GASTROINTESTINAL: Denies any nausea, vomiting, diarrhea, constipation. GENITOURINARY: Denies any hematuria, dysuria. MUSCULOSKELETAL: Denies any arthralgia, myalgia. SKIN: Denies any pruritus or sores. NEUROLOGIC: Denies any migraine, ataxia. ENDOCRINE: Denies any polyuria, polydipsia, heat or cold intolerance. PSYCHIATRIC: Has history of schizoaffective disorder.   PERTINENT LABS: Glucose 92, BUN 7, creatinine 0.65, sodium 139, folic acid 17.2, potassium 3.6, chloride 104, anion gap 10, cholesterol 153, triglycerides 108, hemoglobin A1c 6.5, total protein 7.8, albumin 3.2, bilirubin 0.2, alkaline phosphatase 116, AST 18, ALT 24, thyroid stimulating hormone 0.46; previous level 12.8 on 02/19/2011. White blood cell count 8.9, hemoglobin 11.9, hematocrit 36, platelets 304.    ASSESSMENT  AND PLAN:  1. Hypertension.  The patient is having uncontrolled hypertension. Will start patient on Hydrochlorothiazide. Will check 2-D echo. As well, will change the patient's diet to low sodium diet.  2. Hypothyroidism. The patient has previous study which shows increase in TSH which is indication for hypothyroidism but this is before three years. Level of last TSH is 0.46 which is borderline low so will need to decrease the patient's current dose. Will discontinue her 0.175 mg and will decrease it to 0.088 mg and will check free T4 level in a.m.  3. Borderline diabetes. The patient has hemoglobin A1c of 6.5 which is diagnostic of diabetes. Will start patient on Accu-Cheks before meals and if it is consistently elevated will start her on insulin sliding scale or p.o. hypoglycemic agent. 4. Lower extremity edema. On physical exam it appears to be not significant but will still order 2-D echo secondary to the patient's history of cardiac surgery.  ____________________________ Starleen Arms, MD [id number] D: 08/04/2011 08:34:10 ET T: 08/04/2011 08:49:44 ET JOB#: [id number]  cc: Starleen Arms, MD, <Dictator> Maxxon Schwanke Teena Irani MD ELECTRONICALLY SIGNED 08/12/2011 1:25

## 2014-08-23 NOTE — H&P (Signed)
PATIENT NAME:  Dawn Lambert, Dawn Lambert MR#:  [id number] DATE OF BIRTH:  10-Feb-1991  DATE OF ADMISSION:  08/17/2011  REFERRING PHYSICIAN: Lowella Fairy, MD  ADMITTING PHYSICIAN: Caryn Section, MD  REASON FOR ADMISSION: Homicidal thoughts.   IDENTIFYING INFORMATION: Dawn Lambert is a 24 year old single Caucasian female currently living in [facility] for the past three years. Her mother is her legal guardian. She has a diagnosis of schizoaffective disorder and mild to moderate mental retardation.   HISTORY OF PRESENT ILLNESS: Dawn Lambert is a 24 year old single Caucasian female with the prior diagnosis of schizoaffective disorder and mild to moderate mental retardation who was just discharged from [facility] inpatient psychiatry service on 08/07/2011 after an admission for similar circumstances. She presents again today with homicidal thoughts of wanting to kill her roommate. The patient had endorsed the same thoughts during her prior admission here earlier this month, but says that she had a more positive attitude towards her roommate prior to leaving and going back to the group home. She now says that she was told that her roommate liked her during her prior hospitalization, which she does not believe is true. The patient says she wants to kill her roommate by trying to jerk her down to the floor and then step on her heart and chest. The patient is adamantly refusing to go back to the group home. She is denying any current suicidal thoughts or psychotic symptoms. She is denying any auditory or visual hallucinations. No paranoid thoughts or delusions. The patient did follow up at the [facility] after discharge, on 08/08/2011, but says that there were no medication changes. The patient has had multiple prior admissions to inpatient psychiatry with similar circumstances and behavioral problems which quickly resolve after 2 to 3 days. She is denying feeling more depressed and  denying any suicidal thoughts. She denies any anhedonia, change in energy level, feelings of hopelessness, or frequent crying spells. She does express some desire to be closer to her mom who lives in [place] and is upset that she is down here in [place]. The patient says that she would like to go to a group home that is closer to her mom.   PAST PSYCHIATRIC HISTORY: The patient has had multiple prior inpatient psychiatric hospitalizations at [facility], [facility], and [facility]. She has been followed by the [facility] in [place] for the past several years. The patient does have a long history of self-injurious behavior including inserting objects into her vagina. She is currently on a number of psychotropic medications including Tegretol, Ativan, Zoloft, and Invega, as well as Seroquel. Tegretol level at the time of admission was 7.6, lower than prior admissions in the past, although she does report being compliant with the medication.   FAMILY PSYCHIATRIC HISTORY: The patient denies any history of any mental illness or substance use in the family.   SUBSTANCE ABUSE HISTORY: There is no history of any heavy alcohol use or illicit drug use including cocaine, cannabis, opiate, or stimulant use. Toxicology screen in the ER was negative. No tobacco use.   PAST MEDICAL HISTORY: She has a history of Chiari malformation, history of diabetes with last Hemoglobin A1c of 6.5, open heart surgery at the age of 4 for ASD, history of dermatomyositis, history of hypothyroidism, and obesity. She denies any history of any prior seizures.   OUTPATIENT MEDICATIONS:  1. Zoloft 150 mg p.o. daily.  2. Invega 6 mg extended release at bedtime.  3. Tegretol 300 mg p.o. twice daily. 4. Neurontin 600 mg p.o. three times daily as needed.  5. Seroquel 50 mg once daily at 5:00 p.m.  6. Flonase 50 mcg two sprays nasal once a day. 7. Levothyroxine 88 mcg p.o. daily.  8. Lisinopril 40 mg p.o. daily.   9. Multivitamin 1 tab daily. 10. Propranolol 40 mg p.o. twice a day.  ALLERGIES: No known drug allergies.   SOCIAL HISTORY: The patient currently lives in [facility] for the past of years. Her parents lives in [place] and are both married and living together. Her mother is her legal guardian. The patient has failed a number [place] [facility] [place] in [place]. She has a ninth-grade education and was in special education classes secondary to mental retardation. She is not currently in a relationship and denies being sexually active. Pregnancy test was negative.   LEGAL HISTORY: She denies any history of any arrests or incarcerations.   MENTAL STATUS EXAMINATION: Dawn Lambert is an obese 24 year old Caucasian female who was fully alert and oriented to time, place, and situation. Speech was regular rate and rhythm, fluent and coherent. Eye contact was poor. Mood was described as being "okay", but affect was blunted. She denied any current suicidal thoughts or psychotic symptoms, but did admit to homicidal thoughts with a plan to jerk her roommate to the floor and stomp on her chest. She denied any homicidal thoughts towards anyone else. Judgment and insight are poor. Attention and concentration are poor. The patient could do simple calculations, but could not do any serial sevens. She knew the current president as Obama and prior president as Danae OrleansBush. Abstraction was concrete. Cognition was grossly intact. Attention and concentration were fair. Recall was three out of three initially and two out of three after 5 minutes.   SUICIDE RISK ASSESSMENT: At this time Dawn Lambert is not at an elevated risk of harm to self, but due to impulsive behaviors may harm someone else. The patient's problems however are primarily behavioral. She denies any access to guns.  REVIEW OF SYSTEMS: CONSTITUTIONAL: During prior admission, the patient had reported some recent weight gain, but now  states that she has not gained any weight. She denies any weakness or fatigue. She denies any fever, chills, or night sweats. HEAD: She denies headaches or dizziness. EYES: She denies any diplopia or blurred vision. ENT: She denies any shortness of breath or cough. CARDIOVASCULAR: She denies chest pain or orthopnea. GASTROINTESTINAL: She denies any nausea, vomiting, or abdominal pain. GU: She denies incontinence or problems with frequency of urine. ENDOCRINE: She denies any heat or cold intolerance. LYMPHATIC: She denies any anemia or easy bruising. MUSCULOSKELETAL: She denies any muscle or joint pain. NEUROLOGICAL: She denies any tingling or weakness. PSYCHIATRIC: Please see history of present illness.   PHYSICAL EXAMINATION:   VITAL SIGNS: Blood pressure 157/86, heart rate 119, respirations 22, and temperature 99.6.   HEENT: Normocephalic, atraumatic. Pupils equal, round, and reactive to light and accommodation. Extraocular movements intact. Oral mucosa was moist. No lesions noted.   NECK: Supple. No cervical lymphadenopathy or thyromegaly present.   LUNGS: Clear to auscultation bilaterally. No crackles, rales, or rhonchi.   CARDIAC: S1 and S2 present. Regular rate and rhythm. No murmurs, rubs, or gallops.   ABDOMEN: Soft and moderately obese. Hypoactive bowel sounds present in all four quadrants. No tenderness noted. No masses noted.   EXTREMITIES: +1 pedal pulses bilaterally. No pedal  edema. No rashes or cyanosis.   NEUROLOGIC: Cranial nerves II through XII are grossly intact. Gait was normal and steady. Sensation intact.   DIAGNOSES:   AXIS I: Schizoaffective disorder.   AXIS II: Moderate mental retardation.   AXIS III: Diabetes, history of Chiari malformation, hypothyroidism, history of open heart surgery for ASD defect at age 20.  AXIS IV: Moderate - poor coping skills and long history of self-destructive behaviors.   AXIS V: GAF at present equals 30.   ASSESSMENT AND TREATMENT  RECOMMENDATIONS: Ms. Gaal is a 24 year old single Caucasian female with a history of schizoaffective disorder and behavioral problems that are more likely related to mental retardation. The patient is currently endorsing homicidal thoughts towards her roommate and is requesting a new group home placement. She is denying any suicidal thoughts or psychotic symptoms. 1. Schizoaffective disorder, depressed type: We will plan to increase Seroquel to 50 mg p.o. twice a day at  9:00 a.m. and 5:00 p.m. for mood stabilization and continue Tegretol 300 mg p.o. twice a day, Neurontin 600 mg p.o. three times daily, and Invega 6 mg p.o. nightly. The patient is also on Zoloft 150 mg p.o. daily for depression. Total cholesterol during prior admission this month was 153. Tegretol level was 7.6 in the emergency room, but prior Tegretol level had been 12.4 when the patient was on 300 mg twice daily.  2. Diabetes: Hemoglobin A1c was 6.5 during her last hospitalization. We will place on sliding scale insulin. Blood sugars have been fairly controlled during her most recent hospitalization.  3. Hypertension: The patient will continue on lisinopril 20 mg p.o. daily, hydrochlorothiazide 25 mg p.o. daily, and propranolol 40 mg p.o. twice a day. Cardiac echocardiogram during her last hospitalization did not show any abnormalities.  4. Disposition: WE Will need to confer with the patient's legal guardian, her mother, with regards to placement. The patient does not want to go back to the group home at this time. She will remain under IVC at this time secondary to homicidal thoughts. ____________________________ Doralee Albino. Maryruth Bun, MD akk:[id number] D: 08/17/2011 11:46:47 ET T: 08/17/2011 12:31:53 ET JOB#: [id number]  cc: Goodwin Kamphaus K. Maryruth Bun, MD, <Dictator>  Darliss Ridgel MD ELECTRONICALLY SIGNED 08/20/2011 12:58

## 2014-08-23 NOTE — H&P (Signed)
PATIENT NAME:  MONTZERRAT, BRUNELL MR#:  [id number] DATE OF BIRTH:  12/31/1990  DATE OF ADMISSION:  08/02/2011  REFERRING PHYSICIAN: Gaetano Net, DO  ADMITTING PHYSICIAN: Caryn Section, M.D.   REASON FOR ADMISSION: Homicidal thoughts.   IDENTIFYING INFORMATION: Dawn Lambert is a 24 year old single Caucasian female currently living in [facility] for the past three years. She does have a history of schizoaffective disorder as well as mild mental retardation.  HISTORY OF PRESENT ILLNESS: Dawn Lambert is a 24 year old single Caucasian female with a prior diagnosis of schizoaffective disorder and moderate mental retardation and multiple prior inpatient psychiatric hospitalizations who was brought to the emergency room under an involuntary commitment after she threatened to kill a 11 year old lady, also another resident and roommate of the patient, at the group home. The patient said "I am going to stomp on that lady with dementia to death".  The patient has been angry with her roommate because she says that her roommate is rude to her and calls her names. She also says that she has started to recently have to do chores for her roommate such as cleaning the bathtub. She has been wanting to hurt her over the past one month and says that today it became unbearable and she told the group home staff that she was going to kill her. She does also report some depressive symptoms that she said started when this roommate was unable to do any chores. She does endorse difficulty with some crying spells and feelings of hopelessness, but denies any anhedonia, change in energy level, or problems with insomnia. She has had increased appetite, but the patient is unsure how much weight she has gained. She did try to cut her left upper extremity with her fingernails this past Monday. No other recent self-injurious behaviors, although she does have a long history of self-injurious behaviors including inserting  objects into her vagina. Per collateral information from her mother, the patient did recently go back to her mother's house for the weekend, for her birthday, last week and then had to transition back to the group home, which her mother says is always difficult for her. In addition, today is the anniversary of her grandfather's death, which has always been very upsetting for her as well. She denies any suicidal thoughts or intent to harm herself at this time and denies any current active psychotic symptoms including auditory or visual hallucinations. No paranoid thoughts or delusions.   PAST PSYCHIATRIC HISTORY: The patient has had multiple prior inpatient psychiatric hospitalizations at [facility], [facility], [facility], and [facility]. She is followed by Dr. Angelica Chessman at the [facility] in [place] for the past several years. The patient has a long history of self-injurious behaviors including overdosing as well as inserting objects into her vagina. The patient also has a long history of manipulative behaviors threatening suicide in order to gain attention. She currently on a combination of Tegretol, Ativan, Zoloft, and Invega, as well as Seroquel.   FAMILY PSYCHIATRIC HISTORY: The patient denies any history of any mental illness or substance use in the family.   SUBSTANCE ABUSE HISTORY: The patient denies any history of any heavy alcohol use currently or in the past or any history of any illicit drug use such as cocaine, cannabis, opiate, or stimulant use. Toxicology screen in the emergency room as negative for all substances. Ethanol level was less than 3. No history of any tobacco use.   PAST MEDICAL HISTORY:  1. History of Chiari  malformation. 2. History of borderline diabetes. 3. History of open heart surgery at the age of 4. 4. History of dermatomyositis.  5. History of hypothyroidism.  6.   Obesity 7. No history of any prior seizures.   OUTPATIENT MEDICATIONS:  1. Zoloft 100 mg p.o.  daily. 2. Invega 6 mg p.o. nightly. 3. Tegretol 300 mg p.o. twice a day. 4. Gabapentin 600 mg p.o. three times daily. 5. Synthroid 0.175 mg p.o. daily. 6. Ativan 2 mg p.o. twice a day.  ALLERGIES: No known drug allergies.   SOCIAL HISTORY: The patient was born and raised in the [place] area by both her biological parents who are still married and living in that area. Her mother is her legal guardian. The patient has been living in the [facility] for the past three years. She has failed a number of group home placements in the [place] area in the past. She has a ninth grade education and was in special education classes secondary to mental retardation. The patient is currently on disability. She has never been married and has no children. She is not currently sexually active and denies being in a relationship.   LEGAL HISTORY: She denies any history of any arrests or incarcerations.   MENTAL STATUS EXAM: Dawn Lambert is a 24 year old morbidly obese Caucasian female who is wearing burgundy scrub pants and a lime green shirt. She was fully alert and oriented to time, place, and situation. Speech was regular rate and rhythm, fluent and coherent. Thought processes were linear, logical, and goal directed. Mood was described as being "okay", but affect was flat and the patient had poor eye contact. She was looking down throughout most of the interview. She did endorse some homicidal thoughts towards her roommate with a plan to "stomp on her". She denied any current active or passive suicidal thoughts or psychotic symptoms including auditory or visual hallucinations. Judgment and insight appeared to be poor. Attention and concentration were fair. Recall was three out of three initially and one out of three after 5 minutes. She could name the presidents backwards as Obama and then [place]ush. She could do simple calculations but had difficulty with serial sevens. Abstraction was concrete.   SUICIDE RISK  ASSESSMENT: At this time, Dawn Lambert remains at a low to moderate risk of harm to self and others. She does have a history of poor coping skills and self-destructive behaviors including inserting foreign objects in her body. In addition, she has a history of manipulating situations in order to gain attention. She denies having any access to guns.  REVIEW OF SYSTEMS: CONSTITUTIONAL: She has had some recent weight gain, although the patient does not  know exactly how many pounds she has gained. She denies any weakness or fatigue. She denies any fever, chills, or night sweats. HEAD: She denies any headaches or dizziness. EYES: She denies any diplopia or blurred vision. ENT: She denies any hearing loss or difficulty swallowing. RESPIRATORY: She denies any shortness of breath or cough. CARDIOVASCULAR: She denies chest pain or orthopnea. GASTROINTESTINAL: She denies any nausea, vomiting, or abdominal pain. She denies any change in bowel movements. GENITOURINARY: She denies incontinence or problems with frequency of urine. ENDOCRINE: She denies any heat or cold intolerance. LYMPHATIC: She denies any anemia or easy bruising. MUSCULOSKELETAL: She denies any muscle or joint pain. NEUROLOGICAL: She denies any tingling or weakness. PSYCHIATRIC: Please see history of present illness.   PHYSICAL EXAMINATION:   VITAL SIGNS: Blood pressure 137/90, heart  rate 81, respirations 18, and temperature 99.8.   HEENT: Normocephalic, atraumatic  Pupils equal, round, and reactive to light and accommodation. Extraocular movements intact. Oral mucosa was moist. No lesions noted.   NECK: Supple. No cervical lymphadenopathy or thyromegaly present.   LUNGS: Clear to auscultation bilaterally. No crackles, rales, or rhonchi.   CARDIAC: S1, S2, present. Regular rate and rhythm. No murmurs, rubs, or gallops.   ABDOMEN: Soft. Normoactive bowel sounds present in all four quadrants. No masses noted. No tenderness noted.   EXTREMITIES:  Patient had superficial scar on left  upper extremitiy;+2 pedal pulses bilaterally. No rashes, clubbing, or edema.   NEUROLOGIC: Cranial nerves II through XII are grossly intact. Gait was normal and steady but slow. No hypo or hyperreflexia noted.    LABS/STUDIES: Urinalysis is pending. Urine tox screen is negative for all substances. BMP, TSH, and LFTs are within normal limits. White blood cell count 8.9, hemoglobin 11.9, and platelet count 304. Acetaminophen and salicylate levels were unremarkable.   DIAGNOSES:   AXIS I: Schizoaffective disorder, depressed type.   AXIS II: Moderate mental retardation.  AXIS III: Borderline diabetes, history of Chiari malformation, hypothyroidism, and history of open heart surgery at the age of 49.   AXIS IV: Moderate to severe - poor coping skills and long history of self-destructive behaviors.   AXIS V: GAF at present equals 30.   ASSESSMENT AND TREATMENT RECOMMENDATIONS: Ms. Goatley is a 24 year old single Caucasian female with a history of schizoaffective disorder depressed type who presented to the emergency room after endorsing homicidal thoughts towards her roommate at the group home. She is denying any suicidal thoughts or psychotic symptoms. The patient does have a long history of behavioral problems and manipulates the situation in order to get attention. She has recently had a difficult transition coming back to the group home after spending a weekend with her parents in [place] for her birthday, on 07/28/2012. We will admit to inpatient psychiatry for safety and stabilization, for a brief hospitalization.  1. Schizoaffective disorder, depressed type: We will plan to continue outpatient psychotropic medications as is for now and hold off on any medication changes as the patient primarily struggles with poor coping skills. We will continue Invega 6 mg p.o. nightly, Tegretol 300 mg p.o. twice a day (Tegretol level pending), Zoloft 100 mg p.o. daily, and  Ativan 2 mg p.o. twice a day. We will check B12 and folate level and an EKG to rule out QTc prolongation. We will check lipid panel in the a.m. 2. Morbid obesity: We will place on a low-fat diet.  3. We will continue propranolol 20 mg p.o. twice a day. The does not have prior diagnosis of hypertension listed in prior records, but does have a history of open heart surgery as a child. We will need to check with the group home as to why the patient is on propranolol. It may be being used for akathisia. 4. Rule out urinary tract infection: The patient has a low-grade temperature of 99.8. We will check a urinalysis.  5. Disposition: The patient has a stable living situation at the group home. Her mother and legal guardian was contacted with regards to current admission and was in agreement. She is supportive of the patient returning to the [facility]. Psychotropic medication management follow-up appointment will be with the [facility] in [place]. She will remain under IVC at this time secondary to homicidal thoughts.  TIME SPENT: 80 minutes ____________________________ Doralee Albino. Maryruth Bun,  MD akk:slb D: 08/02/2011 16:32:30 ET T: 08/02/2011 17:02:13 ET JOB#: [id number]  cc: Aarti K. Maryruth Bun, MD, <Dictator> Darliss Ridgel MD ELECTRONICALLY SIGNED 08/02/2011 18:17

## 2016-01-11 ENCOUNTER — Other Ambulatory Visit
Admission: RE | Admit: 2016-01-11 | Discharge: 2016-01-11 | Disposition: A | Discharge status: Home / Self Care | Payer: Medicaid Other | Source: Ambulatory Visit | Attending: Pediatrics | Admitting: Pediatrics

## 2016-01-11 DIAGNOSIS — F3175 Bipolar disorder, in partial remission, most recent episode depressed: Secondary | ICD-10-CM | POA: Diagnosis present

## 2016-01-11 LAB — BASIC METABOLIC PANEL
Anion gap: 10 (ref 5–15)
BUN: 9 mg/dL (ref 6–20)
CO2: 23 mmol/L (ref 22–32)
Calcium: 9.3 mg/dL (ref 8.9–10.3)
Chloride: 101 mmol/L (ref 101–111)
Creatinine, Ser: 0.7 mg/dL (ref 0.44–1.00)
GFR calc Af Amer: >60 mL/min (ref >60)
GFR calc non Af Amer: >60 mL/min (ref >60)
Glucose, Bld: 155 mg/dL — ABNORMAL HIGH (ref 65–99)
Potassium: 3.6 mmol/L (ref 3.5–5.1)
Sodium: 134 mmol/L — ABNORMAL LOW (ref 135–145)

## 2016-01-11 LAB — CBC
HCT: 35 % (ref 35.0–47.0)
Hemoglobin: 12.3 g/dL (ref 12.0–16.0)
MCH: 29.6 pg (ref 26.0–34.0)
MCHC: 35 g/dL (ref 32.0–36.0)
MCV: 84.7 fL (ref 80.0–100.0)
Platelets: 250 10*3/uL (ref 150–440)
RBC: 4.14 MIL/uL (ref 3.80–5.20)
RDW: 13.5 % (ref 11.5–14.5)
WBC: 8.9 10*3/uL (ref 3.6–11.0)

## 2016-01-11 LAB — TSH: TSH: 7.522 u[IU]/mL — ABNORMAL HIGH (ref 0.350–4.500)

## 2016-01-11 LAB — LITHIUM LEVEL: Lithium Lvl: 0.19 mmol/L — ABNORMAL LOW (ref 0.60–1.20)

## 2016-11-07 ENCOUNTER — Ambulatory Visit: Payer: Medicaid Other | Attending: Family Medicine

## 2016-11-07 VITALS — BP 125/67 | HR 80

## 2016-11-07 DIAGNOSIS — M546 Pain in thoracic spine: Secondary | ICD-10-CM | POA: Diagnosis present

## 2016-11-07 DIAGNOSIS — M79671 Pain in right foot: Secondary | ICD-10-CM | POA: Insufficient documentation

## 2016-11-07 DIAGNOSIS — M79672 Pain in left foot: Secondary | ICD-10-CM | POA: Diagnosis present

## 2016-11-07 DIAGNOSIS — M545 Low back pain: Secondary | ICD-10-CM | POA: Insufficient documentation

## 2016-11-07 NOTE — Patient Instructions (Addendum)
  Scapular Retraction: Rowing (Eccentric) - Arms - 45 Degrees (Resistance Band)   Hold end of band in each hand. Pull back until elbows are even with trunk. Hold for 5 seconds. Use ____green____ resistance band. _10__ reps per set, _3__ sets per day. Copyright  VHI. All rights reserved.         Stand up from a chair while keeping your feet, knees and thighs in neutral position resisting the green band  Repeat 10 times   Perform 3 sets daily.       Pt education on proper prone to S/L to sit transfer to promote ability to get into and out of her bed with less back pain. Pt demonstrated and verbalized understanding.

## 2016-11-07 NOTE — Therapy (Signed)
[facility] PH[facility] SPORTS MEDICINE [address] [place], [place], [address] Phone: [phone]   Fax:  [phone]  Physical Therapy Evaluation  Patient Details  Name: Dawn Lambert MRN: [id number] Date of Birth: [birth year] Referring Provider: Tonia GhentAbby Bender, MD  Encounter Date: 11/07/2016      PT End of Session - 11/07/16 0803    Visit Number 1   Number of Visits 1   Date for PT Re-Evaluation 11/07/16   Authorization Type 1   Authorization Time Period of 1 Medicaid   PT Start Time 0805   PT Stop Time 0903   PT Time Calculation (min) 58 min   Activity Tolerance Patient tolerated treatment well   Behavior During Therapy [facility] for tasks assessed/performed      Past Medical History:  Diagnosis Date  . Anxiety   . Arnold-Chiari malformation (HCC)   . Atrial septal defect   . Constipation   . Contraception management   . Dandruff   . Dermatomyositis (HCC)    age 26  . Diabetes mellitus without complication (HCC)   . Elevated blood pressure   . Hypertension   . Hypothyroidism   . Mental retardation   . Mood disorder (HCC)   . Overweight(278.02)   . Pelvic pain   . Prediabetes     Past Surgical History:  Procedure Laterality Date  . ASD REPAIR     at age 26    Vitals:   11/07/16 0816  BP: 125/67  Pulse: 80         Subjective Assessment - 11/07/16 0817    Subjective Back: 2/10 currently (6/10 at worst last week getting up from lying down); R foot 3/10 currently, 7/10 at worst for the past 2 weeks   Pertinent History Pt states having back pain and plantar fasciitis. Per pt Dr. Hessie DienerBender told her that she has a pulled muscle in her back. Pt might have slept the wrong way or did something to her back. Pt was given muscle relaxers which helped at times. Back pain began around April 2018, unknown method of injury.  Has not had imaging for her back or foot.  Pt also states feeling pain in her R foot since around June 2018, unknown  method of injury.    Patient Stated Goals Learn some exercises for back and foot.   Currently in Pain? Yes   Aggravating Factors  foot pain: swimming, walking;   back pain: supine <> sit, sitting, lying down on her back or side.    Pain Relieving Factors Back: muscle relaxers, warm water.  R foot: ankle DF            [facility] PT Assessment - 11/07/16 0827      Assessment   Medical Diagnosis Back pain, thoracic region, R   Referring Provider Tonia GhentAbby Bender, MD   Onset Date/Surgical Date --  07/2016 for back pain; 09/2016 for foot pain   Prior Therapy No PT for current condition.  Had PT for dermatomyositis when she was 26 years old old which helped sometimes.      Precautions   Precaution Comments no known precautions     Restrictions   Other Position/Activity Restrictions no known restrictions     Balance Screen   Has the patient fallen in the past 6 months No   Has the patient had a decrease in activity level because of a fear of falling?  No  pt states fear of falling   Is the  patient reluctant to leave their home because of a fear of falling?  No  pt states fear of falling     Home Environment   Additional Comments Pt lives in a group home ([facility]). Assistted living facitlity. No stairs to enter, no stairs inside.      Prior Function   Vocation Unemployed   Vocation Requirements PLOF: better able to walk, swim, get into and out of her bed without back or foot pain   Leisure swim     Observation/Other Assessments   Observations prone to sit transfer: pt goes performed back extension and went to the quadruped position.   pt states sleeping in prone positon     Posture/Postural Control   Posture Comments protracted neck, backward lean, L shoulder more anterior to R, R trunk rotation, bilaterally pronated feet: Pt sleeping position: prone, facing the L, with L hip flexion, L cervical rotation     AROM   Lumbar Flexion Full with R mid back pain  reproduction of symptoms    Lumbar Extension limited with upper lumbar pain (spinal area; different pain)   Lumbar - Right Side Bend limited with R low back pain  reproduction of symptoms   Lumbar - Left Side Bend limited with upper lumbar pain (spinal area; different pain)   Lumbar - Right Rotation WFL   Lumbar - Left Rotation [facility]     Strength   Right Hip Flexion 3+/5   Right Hip Extension 3/5   Right Hip ABduction 4/5   Left Hip Flexion 3+/5   Left Hip Extension 3/5   Left Hip ABduction 4/5   Right Knee Flexion 4/5   Right Knee Extension 5/5   Left Knee Flexion 4/5   Left Knee Extension 5/5            Objective measurements completed on examination: See above findings.   No PT for current condition. Had PT for dermatomyositis when she was 26 years old which helped sometimes    There-ex  Directed pt with proper prone to R and L S/L to sit transfer 1x each way. Less back pain compared to her normal way of getting out of bed  Pt demonstrated and verbalized understanding on technique.   Standing green T-band rows 10x5 seconds    Sit <> stand from low mat table resting green band around thighs, emphasis on femoral control 10x. Improved bilateral plantar arch height.    Reviewed HEP. Pt demonstrated and verbalized undrstanding.   Improved exercise technique, movement at target joints, use of target muscles after mod verbal, visual, tactile cues.    No complain of increased pain with scapular retraction and sit <> stand exercise. Slight back discomfort with proper prone to S/L to sit transfer but less pain than her normal way of getting out of her bed.           PT Education - 11/07/16 1019    Education provided Yes   Education Details ther-ex, HEP   Person(s) Educated Patient;Caregiver(s)   Methods Explanation;Demonstration;Tactile cues;Verbal cues;Handout   Comprehension Verbalized understanding;Returned demonstration          PT Short Term Goals - 11/07/16 1016      PT SHORT  TERM GOAL #1   Title Pt will be independent with her HEP to promote ability to ambulate, get into and out of her bed, perform functional tasks with less back and foot pain.    Baseline HEP provided, pt demonstrated and verbalized understanding.  Time 1   Period Days   Status Achieved                   Plan - 11/07/16 1002    Clinical Impression Statement Patient is a 26 year old female who came to physical therapy secondary to back and and bilateral foot pain R > L. She also presents poor posture, bilateral hip weakness, decreased femoral control, bilateral foot pronation, improper body mechanics, and difficulty performing functional tasks such as walking and getting into and out of her bed. Patient will benefit from skilled physical therapy to address the aforementioned deficits.  A home exercise program was provided secondary to [facility] insurance only allowing for 1 visit.    History and Personal Factors relevant to plan of care: transportation; Medicaid insurance only allowing for 1 visit   Clinical Presentation Evolving   Clinical Presentation due to: per pt, her symptoms are worse   Clinical Decision Making Low   Rehab Potential Fair   Clinical Impairments Affecting Rehab Potential pain, weakness   PT Frequency One time visit  due to Medicaid limitations   PT Treatment/Interventions Patient/family education;Therapeutic activities;Therapeutic exercise   PT Next Visit Plan Continue with HEP due to Medicaid insurance limitations   Consulted and Agree with Plan of Care Patient;Family member/caregiver   Family Member Lexicographer (caregiver from [facility])      Patient will benefit from skilled therapeutic intervention in order to improve the following deficits and impairments:  Pain, Improper body mechanics, Postural dysfunction, Difficulty walking, Decreased strength  Visit Diagnosis: Pain in thoracic spine - Plan: PT plan of care cert/re-cert  Bilateral low back  pain, unspecified chronicity, with sciatica presence unspecified - Plan: PT plan of care cert/re-cert  Pain in right foot - Plan: PT plan of care cert/re-cert  Pain in left foot - Plan: PT plan of care cert/re-cert     Problem List Patient Active Problem List   Diagnosis Date Noted  . Chest pain 11/05/2012  . Atrial septal defect 09/19/2011   Thank you for your referral.  Loralyn Freshwater PT, DPT   11/07/2016, 6:28 PM  [place] [facility] PHYSICAL AND SPORTS MEDICINE [address], [name], [address] Phone: [phone]   Fax:  [phone]  Name: Dawn Lambert MRN: [id number] Date of Birth: [birth year]

## 2016-11-10 ENCOUNTER — Emergency Department
Admission: EM | Admit: 2016-11-10 | Discharge: 2016-11-10 | Disposition: A | Discharge status: Home / Self Care | Payer: Medicaid Other | Attending: Emergency Medicine | Admitting: Emergency Medicine

## 2016-11-10 ENCOUNTER — Encounter: Payer: Self-pay | Admitting: Emergency Medicine

## 2016-11-10 DIAGNOSIS — E039 Hypothyroidism, unspecified: Secondary | ICD-10-CM | POA: Insufficient documentation

## 2016-11-10 DIAGNOSIS — I10 Essential (primary) hypertension: Secondary | ICD-10-CM | POA: Insufficient documentation

## 2016-11-10 DIAGNOSIS — E119 Type 2 diabetes mellitus without complications: Secondary | ICD-10-CM | POA: Insufficient documentation

## 2016-11-10 DIAGNOSIS — L239 Allergic contact dermatitis, unspecified cause: Secondary | ICD-10-CM | POA: Insufficient documentation

## 2016-11-10 DIAGNOSIS — R0602 Shortness of breath: Secondary | ICD-10-CM | POA: Diagnosis not present

## 2016-11-10 DIAGNOSIS — Z79899 Other long term (current) drug therapy: Secondary | ICD-10-CM | POA: Diagnosis not present

## 2016-11-10 DIAGNOSIS — F79 Unspecified intellectual disabilities: Secondary | ICD-10-CM | POA: Diagnosis not present

## 2016-11-10 DIAGNOSIS — Z7984 Long term (current) use of oral hypoglycemic drugs: Secondary | ICD-10-CM | POA: Insufficient documentation

## 2016-11-10 DIAGNOSIS — R21 Rash and other nonspecific skin eruption: Secondary | ICD-10-CM | POA: Diagnosis present

## 2016-11-10 MED ORDER — DESONIDE 0.05 % EX CREA: TOPICAL_CREAM | CUTANEOUS | 1 refills | Status: AC

## 2016-11-10 MED ORDER — DESONIDE 0.05 % EX CREA: TOPICAL_CREAM | CUTANEOUS | 1 refills | Status: DC

## 2016-11-10 MED ORDER — SULFAMETHOXAZOLE-TRIMETHOPRIM 800-160 MG PO TABS: 1.0000 | ORAL_TABLET | Freq: Two times a day (BID) | ORAL | 1 refills | Status: DC

## 2016-11-10 MED ORDER — DIPHENHYDRAMINE HCL 50 MG/ML IJ SOLN
50.0000 mg | Freq: Once | INTRAMUSCULAR | Status: AC
  Administered 2016-11-10: 50 mg via INTRAMUSCULAR
  Filled 2016-11-10: qty 1

## 2016-11-10 NOTE — ED Notes (Signed)
Attempt was made to contact the legal guardian with new number [phone].

## 2016-11-10 NOTE — ED Notes (Signed)
Attempt made to contact legal guardian and voicemail was left.

## 2016-11-10 NOTE — ED Triage Notes (Signed)
Pt to ed with c/o difficulty breathing last night and today,  Pt states today sob worse with deep breath, denies chest pain, denies sore throat, denies fever.  Pt reports she tried to call PMD at [facility] but they did not have an appt today.  Pt skin warm and dry, speaks in clear dull sentences, appears in no acute distress, no respiratory distress noted.  Pt alert and oriented.  No cough noted.  VSS.

## 2016-11-10 NOTE — ED Notes (Signed)
Pt here with caregiver and does have a legal guardian in which this RN attempted to notify but unable to reach at this time.

## 2016-11-10 NOTE — ED Provider Notes (Signed)
[facility] Emergency Department Provider Note   ____________________________________________   I have reviewed the triage vital signs and the nursing notes.   HISTORY  Chief Complaint Shortness of Breath    HPI Dawn Lambert is a 26 y.o. female presents to emergency department with erythematous and your care Rash along bilateral upper extremities that began yesterday. Sometime after the rash developed patient noted onset of shortness of breath that worsened with exertion. Patient denies changes in lotions, soap, detergent, new medication, new vitamins or interaction with chemicals to her knowledge. Patient lives in a group home and as a part of her day she interacts with other housemates. She feels she may have come into contact with something when interacting with other housemates. Patient denies fever, chills, headache, vision changes, chest pain, chest tightness, abdominal pain, nausea and vomiting.  Past Medical History:  Diagnosis Date  . Anxiety   . Arnold-Chiari malformation (HCC)   . Atrial septal defect   . Constipation   . Contraception management   . Dandruff   . Dermatomyositis (HCC)    age 89  . Diabetes mellitus without complication (HCC)   . Elevated blood pressure   . Hypertension   . Hypothyroidism   . Mental retardation   . Mood disorder (HCC)   . Overweight(278.02)   . Pelvic pain   . Prediabetes     Patient Active Problem List   Diagnosis Date Noted  . Chest pain 11/05/2012  . Atrial septal defect 09/19/2011    Past Surgical History:  Procedure Laterality Date  . ASD REPAIR     at age 54    Prior to Admission medications   Medication Sig Start Date End Date Taking? Authorizing Provider  acetaminophen (TYLENOL) 325 MG tablet Take 650 mg by mouth every 6 (six) hours as needed.    [provider]  albuterol (PROAIR HFA) 108 (90 BASE) MCG/ACT inhaler Inhale 2 puffs into the lungs 4 (four) times daily.     [provider]  alum & mag hydroxide-simeth (MYLANTA) 200-200-20 MG/5ML suspension Take by mouth every 4 (four) hours as needed.    [provider]  atorvastatin (LIPITOR) 10 MG tablet Take 10 mg by mouth daily.    [provider]  benzonatate (TESSALON) 200 MG capsule Take 200 mg by mouth 3 (three) times daily as needed for cough.    [provider]  carbamazepine (TEGRETOL) 100 MG chewable tablet Chew 100 mg by mouth 3 (three) times daily.    [provider]  Cholecalciferol (VITAMIN D PO) Take by mouth.    [provider]  clonazePAM (KLONOPIN) 1 MG tablet Take 1 mg by mouth 2 (two) times daily.    [provider]  cyclobenzaprine (FLEXERIL) 10 MG tablet Take 10 mg by mouth.    [provider]  desonide (DESOWEN) 0.05 % cream Apply to affected area 2 times daily as needed. 11/10/16 11/17/16  Mayeli Bornhorst M, PA-C  fluconazole (DIFLUCAN) 150 MG tablet Take 150 mg by mouth daily.    [provider]  fluticasone (FLONASE) 50 MCG/ACT nasal spray Place 2 sprays into the nose daily.    [provider]  gabapentin (NEURONTIN) 600 MG tablet Take 300 mg by mouth 3 (three) times daily.     [provider]  hydrochlorothiazide (HYDRODIURIL) 25 MG tablet Take 25 mg by mouth daily.    [provider]  hydrocortisone 2.5 % cream Apply 1 application topically as needed.  [provider]  ibuprofen (ADVIL,MOTRIN) 600 MG tablet Take 600 mg by mouth 3 (three) times daily.    [provider]  levothyroxine (SYNTHROID, LEVOTHROID) 137 MCG tablet Take 137 mcg by mouth daily before breakfast.    [provider]  lisinopril (PRINIVIL,ZESTRIL) 40 MG tablet Take 40 mg by mouth daily.    [provider]  LITHIUM CARBONATE PO Take by mouth.    [provider]  LORazepam (ATIVAN) 2 MG tablet Take 2 mg by mouth every 6 (six) hours as needed.    [provider]    magnesium hydroxide (MILK OF MAGNESIA) 400 MG/5ML suspension Take 5 mLs by mouth daily as needed.    [provider]  METFORMIN HCL PO Take by mouth.    [provider]  mometasone (ELOCON) 0.1 % cream Apply 1 application topically daily.    [provider]  Multiple Vitamin (MULTIVITAMIN) capsule Take 1 capsule by mouth daily.    [provider]  mupirocin ointment (BACTROBAN) 2 % Place 1 application into the nose 2 (two) times daily.    [provider]  nystatin cream (MYCOSTATIN) Apply 1 application topically as needed for dry skin.    [provider]  ondansetron (ZOFRAN) 4 MG tablet Take 4 mg by mouth every 8 (eight) hours as needed for nausea or vomiting.    [provider]  paliperidone (INVEGA) 6 MG 24 hr tablet Take 6 mg by mouth at bedtime.    [provider]  Paliperidone Palmitate (INVEGA TRINZA) 819 MG/2.625ML SUSP Inject into the muscle.    [provider]  propranolol (INDERAL) 40 MG tablet Take 40 mg by mouth 2 (two) times daily.    [provider]  pseudoephedrine (SUDOGEST) 30 MG tablet Take 30 mg by mouth every 4 (four) hours as needed for congestion.    [provider]  Pseudoephedrine HCl (SUDOGEST PO) Take by mouth.    [provider]  QUEtiapine (SEROQUEL) 200 MG tablet Take 200 mg by mouth at bedtime.    [provider]  sertraline (ZOLOFT) 100 MG tablet Take 1 and 1/2 tablet daily for depression.    [provider]  sulfamethoxazole-trimethoprim (BACTRIM DS,SEPTRA DS) 800-160 MG tablet Take 1 tablet by mouth 2 (two) times daily. 11/10/16   Tiajuana Leppanen M, PA-C    Allergies Augmentin [amoxicillin-pot clavulanate]  History reviewed. No pertinent family history.  Social History Social History  Substance Use Topics  . Smoking status: Never Smoker  . Smokeless tobacco: Never Used  . Alcohol use No    Review of Systems Constitutional:  Negative for fever/chills Eyes: No visual changes. ENT:  Negative for sore throat and for difficulty swallowing Cardiovascular: Denies chest pain. Respiratory: Positive shortness of breath. Gastrointestinal: No abdominal pain.  No nausea, vomiting, diarrhea. Musculoskeletal: Negative for generalized body aches. Skin: Positive for rash along bilateral upper extremities. Neurological: Negative for headaches.  Negative focal weakness or numbness. ____________________________________________   PHYSICAL EXAM:  VITAL SIGNS: ED Triage Vitals  Enc Vitals Group     BP 11/10/16 1022 136/89     Pulse Rate 11/10/16 1022 71     Resp 11/10/16 1022 16     Temp 11/10/16 1022 97.7 F (36.5 C)     Temp Source 11/10/16 1022 Oral     SpO2 11/10/16 1022 100 %     Weight 11/10/16 1022 (!) 311 lb (141.1 kg)     Height --  Head Circumference --      Peak Flow --      Pain Score 11/10/16 1021 0     Pain Loc --      Pain Edu? --      Excl. in GC? --     Constitutional: Alert and oriented. Well appearing and in no acute distress.  Head: Normocephalic and atraumatic. Eyes: Conjunctivae are normal. PERRL. Nose: No congestion/rhinorrhea Mouth/Throat: Mucous membranes are moist. Oropharynx clear, non-erythematous nonedematous. Neck: Supple.  Hematological/Lymphatic/Immunological: No cervical lymphadenopathy. Cardiovascular: Normal rate, regular rhythm. Normal distal pulses. Respiratory: Normal respiratory effort. No wheezes/rales/rhonchi. Lungs CTAB with no W/R/R. Gastrointestinal: Soft and nontender. Musculoskeletal: Nontender with normal range of motion in all extremities. Neurologic: Normal speech and language, at patient's neurological baseline..  Skin:  Skin is warm, dry and intact. Circumscribed, raised lesions that are erythematous with urticaria along bilateral upper extremities majority noted along the posterior and axillary region. No drainage, non-vesicular in nature. Psychiatric: Mood  and affect are normal.  ____________________________________________   LABS (all labs ordered are listed, but only abnormal results are displayed)  Labs Reviewed - No data to display ____________________________________________  EKG None ____________________________________________  RADIOLOGY None ____________________________________________   PROCEDURES  Procedure(s) performed: no    Critical Care performed: no ____________________________________________   INITIAL IMPRESSION / ASSESSMENT AND PLAN / ED COURSE  Pertinent labs & imaging results that were available during my care of the patient were reviewed by me and considered in my medical decision making (see chart for details).  Patient presents to emergency department with urticarial lesions along bilateral upper extremities with associated shortness of breath that began 1 day ago.[name] History and physical exam findings are consistent with likely contact dermatitis from an unknown substance. Patient has history of skin infection and cellulitis with treatment for MRSA in the past. Current rash/lesion does not appear infectious however will empirically treat based on history and current living environment, group home. Patient noted minor relief of itching following diphenhydramine 50 mg IM. Patient will be prescribed a course of Bactrim DS for 10 days, 2 times per day, desonide 0.05% cream to be applied as needed until symptoms and she will continue over-the-counter Benadryl as needed.. Patient advised to follow up with PCP as needed or return to the emergency department if symptoms return or worsen. Patient informed of clinical course, understand medical decision-making process, and agree with plan.     ____________________________________________   FINAL CLINICAL IMPRESSION(S) / ED DIAGNOSES  Final diagnoses:  Allergic contact dermatitis, unspecified trigger  Shortness of breath       NEW MEDICATIONS STARTED DURING  THIS VISIT:  Discharge Medication List as of 11/10/2016 12:21 PM    START taking these medications   Details  sulfamethoxazole-trimethoprim (BACTRIM DS,SEPTRA DS) 800-160 MG tablet Take 1 tablet by mouth 2 (two) times daily., Starting Fri 11/10/2016, Print         Note:  This document was prepared using Dragon voice recognition software and may include unintentional dictation errors.    Clois Comber, PA-C 11/10/16 1446    Phineas Semen, MD 11/10/16 [phone]

## 2016-11-10 NOTE — ED Notes (Signed)
Patient's triage information entered was not entered by this RN. Entered by Inetta Fermoina, RN under this sign in by mistake.

## 2016-11-10 NOTE — Discharge Instructions (Signed)
Take Benadryl (diphenhydramine) as needed, as directed until symptoms resolve.

## 2016-11-10 NOTE — ED Notes (Signed)
Unsuccessful attempt was made to contact Dawn Lambert from group home at [phone] and [phone]. Patient's legal guardian, Dawn Lambert, called back and gave permission to see and treat the patient to this Clinical research associatewriter and 2nd RN [facility].

## 2017-07-19 ENCOUNTER — Encounter: Payer: Self-pay | Admitting: *Deleted

## 2017-08-14 ENCOUNTER — Encounter: Payer: Self-pay | Admitting: General Surgery

## 2017-08-14 ENCOUNTER — Telehealth: Payer: Self-pay

## 2017-08-14 ENCOUNTER — Ambulatory Visit (INDEPENDENT_AMBULATORY_CARE_PROVIDER_SITE_OTHER): Payer: Medicaid Other | Admitting: General Surgery

## 2017-08-14 VITALS — BP 130/74 | HR 70 | Resp 16 | Ht 60.0 in | Wt 276.0 lb

## 2017-08-14 DIAGNOSIS — K429 Umbilical hernia without obstruction or gangrene: Secondary | ICD-10-CM | POA: Diagnosis not present

## 2017-08-14 NOTE — Telephone Encounter (Signed)
Message left for the patient's mother, Dawn Lambert to call back about scheduling her daughter's surgery.

## 2017-08-14 NOTE — Progress Notes (Signed)
Patient ID: Dawn Lambert, female   DOB: Feb 17, 1991, 27 y.o.   MRN: [id number]  Chief Complaint  Patient presents with  . Other    HPI Dawn Lambert is a 27 y.o. female here today for a evalaution of a umbilical hernia. Patient noticed this area about a month. No change in size some pain with activity.  Moves bowels daily. Patient noticed some drainage for umbilical area.  Caregiver is present at visit.  HPI  Past Medical History:  Diagnosis Date  . Anxiety   . Arnold-Chiari malformation (HCC)   . Atrial septal defect   . Constipation   . Contraception management   . Dandruff   . Dermatomyositis (HCC)    age 24  . Diabetes mellitus without complication (HCC)   . Elevated blood pressure   . Hypertension   . Hypothyroidism   . Mental retardation   . Mood disorder (HCC)   . Overweight(278.02)   . Pelvic pain   . Prediabetes     Past Surgical History:  Procedure Laterality Date  . ASD REPAIR     at age 52    History reviewed. No pertinent family history.  Social History Social History   Tobacco Use  . Smoking status: Never Smoker  . Smokeless tobacco: Never Used  Substance Use Topics  . Alcohol use: No  . Drug use: No    Allergies  Allergen Reactions  . Augmentin [Amoxicillin-Pot Clavulanate]     Current Outpatient Medications  Medication Sig Dispense Refill  . acetaminophen (TYLENOL) 325 MG tablet Take 650 mg by mouth every 6 (six) hours as needed.    [name] albuterol (PROAIR HFA) 108 (90 BASE) MCG/ACT inhaler Inhale 2 puffs into the lungs 4 (four) times daily.    [name] alum & mag hydroxide-simeth (MYLANTA) 200-200-20 MG/5ML suspension Take by mouth every 4 (four) hours as needed.    [name] atorvastatin (LIPITOR) 10 MG tablet Take 10 mg by mouth daily.    . benzonatate (TESSALON) 200 MG capsule Take 200 mg by mouth 3 (three) times daily as needed for cough.    . Cholecalciferol (VITAMIN D PO) Take by mouth.    . clonazePAM (KLONOPIN) 1 MG tablet Take 1 mg by mouth 2  (two) times daily.    . fluconazole (DIFLUCAN) 150 MG tablet Take 150 mg by mouth daily.    . fluticasone (FLONASE) 50 MCG/ACT nasal spray Place 2 sprays into the nose daily.    . hydrochlorothiazide (HYDRODIURIL) 25 MG tablet Take 25 mg by mouth daily.    [name] ibuprofen (ADVIL,MOTRIN) 600 MG tablet Take 600 mg by mouth 3 (three) times daily.    [name] levothyroxine (SYNTHROID, LEVOTHROID) 137 MCG tablet Take 137 mcg by mouth daily before breakfast.    . lisinopril (PRINIVIL,ZESTRIL) 40 MG tablet Take 40 mg by mouth daily.    [name] LITHIUM CARBONATE PO Take by mouth.    [name] LORazepam (ATIVAN) 2 MG tablet Take 2 mg by mouth every 6 (six) hours as needed.    . magnesium hydroxide (MILK OF MAGNESIA) 400 MG/5ML suspension Take 5 mLs by mouth daily as needed.    [name] METFORMIN HCL PO Take 500 mg by mouth.     . mometasone (ELOCON) 0.1 % cream Apply 1 application topically daily.    . Multiple Vitamin (MULTIVITAMIN) capsule Take 1 capsule by mouth daily.    . mupirocin ointment (BACTROBAN) 2 % Place 1 application into the nose 2 (two) times daily.    [name] nystatin  cream (MYCOSTATIN) Apply 1 application topically as needed for dry skin.    . paliperidone (INVEGA) 6 MG 24 hr tablet Take 6 mg by mouth at bedtime.    . propranolol (INDERAL) 40 MG tablet Take 40 mg by mouth 2 (two) times daily.    . Pseudoephedrine HCl (SUDOGEST PO) Take by mouth.    . QUEtiapine (SEROQUEL) 200 MG tablet Take 200 mg by mouth at bedtime.    . sertraline (ZOLOFT) 100 MG tablet Take 1 and 1/2 tablet daily for depression.     No current facility-administered medications for this visit.     Review of Systems Review of Systems  Constitutional: Negative.   Respiratory: Negative.   Cardiovascular: Negative.     Blood pressure 130/74, pulse 70, resp. rate 16, height 5' (1.524 m), weight 276 lb (125.2 kg).  Physical Exam Physical Exam  Constitutional: She is oriented to person, place, and time. She appears well-developed and  well-nourished.  Eyes: No scleral icterus.  Neck: Neck supple.  Cardiovascular: Normal rate and regular rhythm.  Pulmonary/Chest: Effort normal and breath sounds normal.  Abdominal: Soft. Normal appearance and bowel sounds are normal. There is no tenderness. A hernia is present. Ventral: small umbilical hernia.    Lymphadenopathy:    She has no cervical adenopathy.  Neurological: She is alert and oriented to person, place, and time.  Skin: Skin is warm and dry.    Data Reviewed PCP notes of July 17, 2017. No recent laboratory studies.  Assessment    Symptomatic umbilical hernia with skin ulceration likely secondary to cutaneous trauma.    Plan  Hernia precautions and incarceration were discussed with the patient. If they develop symptoms of an incarcerated hernia, they were encouraged to seek prompt medical attention.  I have recommended repair of the hernia on an outpatient basis in the near future. The risk of infection was reviewed.  Unlikely role for mesh repair based on clinical exam.  No swimming for two weeks.  HPI, Physical Exam, Assessment and Plan have been scribed under the direction and in the presence of Donnalee CurryJeffrey Chitara Clonch, MD.  Ples SpecterJessica Qualls, CMA  I have completed the exam and reviewed the above documentation for accuracy and completeness.  I agree with the above.  Museum/gallery conserv[employer] has been used and any errors in dictation or transcription are unintentional.  Donnalee CurryJeffrey Fedra Lanter, M.D., F.A.C.S.  Merrily PewJeffrey W Kemp Gomes 08/15/2017, 3:42 PM

## 2017-08-14 NOTE — Telephone Encounter (Signed)
Spoke with patient's mother, Cristela BlueRenee Derksen, about scheduling her surgery. The patient is scheduled for [facility]17[facility]. She will pre admit at the hospital. I have informed her mother and the Group home staff where she resides of the surgery date and instructions. I will call back with information on the pre admit date and time once this is scheduled.

## 2017-08-14 NOTE — Patient Instructions (Signed)
Umbilical Hernia, Adult A hernia is a bulge of tissue that pushes through an opening between muscles. An umbilical hernia happens in the abdomen, near the belly button (umbilicus). The hernia may contain tissues from the small intestine, large intestine, or fatty tissue covering the intestines (omentum). Umbilical hernias in adults tend to get worse over time, and they require surgical treatment. There are several types of umbilical hernias. You may have:  A hernia located just above or below the umbilicus (indirect hernia). This is the most common type of umbilical hernia in adults.  A hernia that forms through an opening formed by the umbilicus (direct hernia).  A hernia that comes and goes (reducible hernia). A reducible hernia may be visible only when you strain, lift something heavy, or cough. This type of hernia can be pushed back into the abdomen (reduced).  A hernia that traps abdominal tissue inside the hernia (incarcerated hernia). This type of hernia cannot be reduced.  A hernia that cuts off blood flow to the tissues inside the hernia (strangulated hernia). The tissues can start to die if this happens. This type of hernia requires emergency treatment.  What are the causes? An umbilical hernia happens when tissue inside the abdomen presses on a weak area of the abdominal muscles. What increases the risk? You may have a greater risk of this condition if you:  Are obese.  Have had several pregnancies.  Have a buildup of fluid inside your abdomen (ascites).  Have had surgery that weakens the abdominal muscles.  What are the signs or symptoms? The main symptom of this condition is a painless bulge at or near the belly button. A reducible hernia may be visible only when you strain, lift something heavy, or cough. Other symptoms may include:  Dull pain.  A feeling of pressure.  Symptoms of a strangulated hernia may include:  Pain that gets increasingly worse.  Nausea and  vomiting.  Pain when pressing on the hernia.  Skin over the hernia becoming red or purple.  Constipation.  Blood in the stool.  How is this diagnosed? This condition may be diagnosed based on:  A physical exam. You may be asked to cough or strain while standing. These actions increase the pressure inside your abdomen and force the hernia through the opening in your muscles. Your health care provider may try to reduce the hernia by pressing on it.  Your symptoms and medical history.  How is this treated? Surgery is the only treatment for an umbilical hernia. Surgery for a strangulated hernia is done as soon as possible. If you have a small hernia that is not incarcerated, you may need to lose weight before having surgery. Follow these instructions at home:  Lose weight, if told by your health care provider.  Do not try to push the hernia back in.  Watch your hernia for any changes in color or size. Tell your health care provider if any changes occur.  You may need to avoid activities that increase pressure on your hernia.  Do not lift anything that is heavier than 10 lb (4.5 kg) until your health care provider says that this is safe.  Take over-the-counter and prescription medicines only as told by your health care provider.  Keep all follow-up visits as told by your health care provider. This is important. Contact a health care provider if:  Your hernia gets larger.  Your hernia becomes painful. Get help right away if:  You develop sudden, severe pain near the   area of your hernia.  You have pain as well as nausea or vomiting.  You have pain and the skin over your hernia changes color.  You develop a fever. This information is not intended to replace advice given to you by your health care provider. Make sure you discuss any questions you have with your health care provider. Document Released: 09/17/2015 Document Revised: 12/19/2015 Document Reviewed:  09/17/2015 Elsevier Interactive Patient Education  2018 [employer]vier Inc.  

## 2017-08-15 DIAGNOSIS — K429 Umbilical hernia without obstruction or gangrene: Secondary | ICD-10-CM | POA: Insufficient documentation

## 2017-08-20 ENCOUNTER — Telehealth: Payer: Self-pay | Admitting: *Deleted

## 2017-08-20 NOTE — Telephone Encounter (Signed)
Patient's mom, Cristela BlueRenee Barba and Claris CheMargaret at the Group Home have been notified of patient's Pre-Admission appointment date and time.   They are aware to call the office back if they have further questions.

## 2017-09-10 ENCOUNTER — Other Ambulatory Visit: Payer: Self-pay

## 2017-09-10 ENCOUNTER — Encounter
Admission: RE | Admit: 2017-09-10 | Discharge: 2017-09-10 | Disposition: A | Discharge status: Home / Self Care | Payer: Medicaid Other | Source: Ambulatory Visit | Attending: General Surgery | Admitting: General Surgery

## 2017-09-10 DIAGNOSIS — Z01810 Encounter for preprocedural cardiovascular examination: Secondary | ICD-10-CM | POA: Diagnosis not present

## 2017-09-10 DIAGNOSIS — E119 Type 2 diabetes mellitus without complications: Secondary | ICD-10-CM | POA: Insufficient documentation

## 2017-09-10 DIAGNOSIS — I10 Essential (primary) hypertension: Secondary | ICD-10-CM | POA: Insufficient documentation

## 2017-09-10 DIAGNOSIS — R9431 Abnormal electrocardiogram [ECG] [EKG]: Secondary | ICD-10-CM | POA: Diagnosis not present

## 2017-09-10 DIAGNOSIS — Z01812 Encounter for preprocedural laboratory examination: Secondary | ICD-10-CM | POA: Insufficient documentation

## 2017-09-10 HISTORY — DX: Other complications of anesthesia, initial encounter: T88.59XA

## 2017-09-10 HISTORY — DX: Adverse effect of unspecified anesthetic, initial encounter: T41.45XA

## 2017-09-10 LAB — CBC
HCT: 37.3 % (ref 35.0–47.0)
Hemoglobin: 12.5 g/dL (ref 12.0–16.0)
MCH: 28.4 pg (ref 26.0–34.0)
MCHC: 33.6 g/dL (ref 32.0–36.0)
MCV: 84.7 fL (ref 80.0–100.0)
Platelets: 293 10*3/uL (ref 150–440)
RBC: 4.4 MIL/uL (ref 3.80–5.20)
RDW: 14 % (ref 11.5–14.5)
WBC: 9.3 10*3/uL (ref 3.6–11.0)

## 2017-09-10 LAB — BASIC METABOLIC PANEL
Anion gap: 8 (ref 5–15)
BUN: 10 mg/dL (ref 6–20)
CO2: 25 mmol/L (ref 22–32)
Calcium: 9.3 mg/dL (ref 8.9–10.3)
Chloride: 103 mmol/L (ref 101–111)
Creatinine, Ser: 0.64 mg/dL (ref 0.44–1.00)
GFR calc Af Amer: >60 mL/min (ref >60)
GFR calc non Af Amer: >60 mL/min (ref >60)
Glucose, Bld: 186 mg/dL — ABNORMAL HIGH (ref 65–99)
Potassium: 3.8 mmol/L (ref 3.5–5.1)
Sodium: 136 mmol/L (ref 135–145)

## 2017-09-10 NOTE — Patient Instructions (Signed)
  Your procedure is scheduled on: Monday Sep 17, 2017 Report to Same Day Surgery 2nd floor medical mall (Medical Mall Entrance-take elevator on left to 2nd floor.  Check in with surgery information desk.) To find out your arrival time please call [phone] between 1PM - 3PM on Friday Sep 14, 2017  Remember: Instructions that are not followed completely may result in serious medical risk, up to and including death, or upon the discretion of your surgeon and anesthesiologist your surgery may need to be rescheduled.    _x___ 1. Do not eat food after midnight the night before your procedure. You may drink clear liquids up to 2 hours before you are scheduled to arrive at the hospital for your procedure.  Do not drink clear liquids within 2 hours of your scheduled arrival to the hospital.  Clear liquids include  --Water or Apple juice without pulp  --Clear carbohydrate beverage such as Gatorade  --Black Coffee or Clear Tea (No milk, no creamers, do not add anything to the coffee or tea   No gum chewing or hard candies.      __x__ 2. No Alcohol for 24 hours before or after surgery.   __x__3. No Smoking or e-cigarettes for 24 prior to surgery.  Do not use any chewable tobacco products for at least 6 hour prior to surgery   ____  4. Bring all medications with you on the day of surgery if instructed.    __x__ 5. Notify your doctor if there is any change in your medical condition     (cold, fever, infections).   __x__6. On the morning of surgery brush your teeth with toothpaste and water.  You may rinse your mouth with mouth wash if you wish.  Do not swallow any toothpaste or mouthwash.   Do not wear jewelry, make-up, hairpins, clips or nail polish.  Do not wear lotions, powders, deodorant, or perfumes.   Do not shave 48 hours prior to surgery.   Do not bring valuables to the hospital.    [facility] is not responsible for any belongings or valuables.               Contacts, dentures or  bridgework may not be worn into surgery.  Leave your suitcase in the car. After surgery it may be brought to your room.  For patients admitted to the hospital, discharge time is determined by your treatment team.  Please read over the following fact sheets that you were given:   [facility] Preparing for Surgery and or MRSA Information   _x___ Take anti-hypertensive listed below, cardiac, seizure, asthma, anti-reflux and psychiatric medicines. These include:  1. Sertraline/Zoloft  2. Levothyroxine/Synthroid  3. Tegretol  4. Propranolol/Inderal  5. Clonazepam/Klonopin  6. Gabapentin/Neurontin  _x___ Use CHG Soap or sage wipes as directed on instruction sheet   _x___ Stop Metformin 2 days prior to surgery.    _x___ Follow recommendations from Cardiologist, Pulmonologist or PCP regarding stopping Aspirin, Coumadin, Plavix ,Eliquis, Effient, or Pradaxa, and Pletal.  _x___Stop Anti-inflammatories such as Advil, Aleve, Ibuprofen, Motrin, Naproxen, Naprosyn, Goodies powders or aspirin products. OK to take Tylenol and Celebrex.   _x___ Stop supplements until after surgery.  But may continue Vitamin D, Vitamin B, and multivitamin.

## 2017-09-17 ENCOUNTER — Ambulatory Visit
Admission: RE | Admit: 2017-09-17 | Discharge: 2017-09-17 | Disposition: A | Discharge status: Home / Self Care | Payer: Medicaid Other | Source: Ambulatory Visit | Attending: General Surgery | Admitting: General Surgery

## 2017-09-17 ENCOUNTER — Encounter
Admission: RE | Disposition: A | Discharge status: Home / Self Care | Payer: Self-pay | Source: Ambulatory Visit | Attending: General Surgery

## 2017-09-17 ENCOUNTER — Ambulatory Visit: Payer: Medicaid Other | Admitting: Anesthesiology

## 2017-09-17 ENCOUNTER — Encounter: Payer: Self-pay | Admitting: *Deleted

## 2017-09-17 ENCOUNTER — Telehealth: Payer: Self-pay

## 2017-09-17 DIAGNOSIS — Z6841 Body Mass Index (BMI) 40.0 and over, adult: Secondary | ICD-10-CM | POA: Diagnosis not present

## 2017-09-17 DIAGNOSIS — Z79899 Other long term (current) drug therapy: Secondary | ICD-10-CM | POA: Insufficient documentation

## 2017-09-17 DIAGNOSIS — Z7984 Long term (current) use of oral hypoglycemic drugs: Secondary | ICD-10-CM | POA: Diagnosis not present

## 2017-09-17 DIAGNOSIS — E119 Type 2 diabetes mellitus without complications: Secondary | ICD-10-CM | POA: Diagnosis not present

## 2017-09-17 DIAGNOSIS — Z880 Allergy status to penicillin: Secondary | ICD-10-CM | POA: Diagnosis not present

## 2017-09-17 DIAGNOSIS — Z79890 Hormone replacement therapy (postmenopausal): Secondary | ICD-10-CM | POA: Diagnosis not present

## 2017-09-17 DIAGNOSIS — K439 Ventral hernia without obstruction or gangrene: Secondary | ICD-10-CM

## 2017-09-17 DIAGNOSIS — E669 Obesity, unspecified: Secondary | ICD-10-CM | POA: Diagnosis not present

## 2017-09-17 DIAGNOSIS — Z793 Long term (current) use of hormonal contraceptives: Secondary | ICD-10-CM | POA: Diagnosis not present

## 2017-09-17 DIAGNOSIS — K429 Umbilical hernia without obstruction or gangrene: Secondary | ICD-10-CM

## 2017-09-17 DIAGNOSIS — F419 Anxiety disorder, unspecified: Secondary | ICD-10-CM | POA: Diagnosis not present

## 2017-09-17 DIAGNOSIS — E039 Hypothyroidism, unspecified: Secondary | ICD-10-CM | POA: Diagnosis not present

## 2017-09-17 DIAGNOSIS — I10 Essential (primary) hypertension: Secondary | ICD-10-CM | POA: Insufficient documentation

## 2017-09-17 DIAGNOSIS — Z7951 Long term (current) use of inhaled steroids: Secondary | ICD-10-CM | POA: Insufficient documentation

## 2017-09-17 HISTORY — PX: UMBILICAL HERNIA REPAIR: SHX196

## 2017-09-17 LAB — GLUCOSE, CAPILLARY
Glucose-Capillary: 144 mg/dL — ABNORMAL HIGH (ref 65–99)
Glucose-Capillary: 146 mg/dL — ABNORMAL HIGH (ref 65–99)

## 2017-09-17 LAB — POCT PREGNANCY, URINE: Preg Test, Ur: NEGATIVE

## 2017-09-17 SURGERY — REPAIR, HERNIA, UMBILICAL, ADULT
Anesthesia: General | Wound class: Clean

## 2017-09-17 MED ORDER — ROCURONIUM BROMIDE 50 MG/5ML IV SOLN
INTRAVENOUS | Status: AC
  Filled 2017-09-17: qty 1

## 2017-09-17 MED ORDER — DEXAMETHASONE SODIUM PHOSPHATE 10 MG/ML IJ SOLN
INTRAMUSCULAR | Status: DC | PRN
  Administered 2017-09-17: 10 mg via INTRAVENOUS

## 2017-09-17 MED ORDER — FENTANYL CITRATE (PF) 250 MCG/5ML IJ SOLN
INTRAMUSCULAR | Status: AC
  Filled 2017-09-17: qty 5

## 2017-09-17 MED ORDER — ACETAMINOPHEN 10 MG/ML IV SOLN
INTRAVENOUS | Status: AC
  Filled 2017-09-17: qty 100

## 2017-09-17 MED ORDER — HYDROMORPHONE HCL 1 MG/ML IJ SOLN
INTRAMUSCULAR | Status: AC
  Filled 2017-09-17: qty 1

## 2017-09-17 MED ORDER — FENTANYL CITRATE (PF) 100 MCG/2ML IJ SOLN
INTRAMUSCULAR | Status: DC | PRN
  Administered 2017-09-17: 100 ug via INTRAVENOUS

## 2017-09-17 MED ORDER — PROPOFOL 10 MG/ML IV BOLUS
INTRAVENOUS | Status: AC
  Filled 2017-09-17: qty 20

## 2017-09-17 MED ORDER — ONDANSETRON HCL 4 MG/2ML IJ SOLN
INTRAMUSCULAR | Status: DC | PRN
  Administered 2017-09-17: 4 mg via INTRAVENOUS

## 2017-09-17 MED ORDER — FAMOTIDINE 20 MG PO TABS
20.0000 mg | ORAL_TABLET | Freq: Once | ORAL | Status: AC
  Administered 2017-09-17: 20 mg via ORAL

## 2017-09-17 MED ORDER — PHENYLEPHRINE HCL 10 MG/ML IJ SOLN
INTRAMUSCULAR | Status: DC | PRN
  Administered 2017-09-17 (×4): 100 ug via INTRAVENOUS

## 2017-09-17 MED ORDER — FAMOTIDINE 20 MG PO TABS
ORAL_TABLET | ORAL | Status: AC
  Administered 2017-09-17: 20 mg via ORAL
  Filled 2017-09-17: qty 1

## 2017-09-17 MED ORDER — MIDAZOLAM HCL 2 MG/2ML IJ SOLN
INTRAMUSCULAR | Status: AC
  Filled 2017-09-17: qty 2

## 2017-09-17 MED ORDER — ONDANSETRON HCL 4 MG/2ML IJ SOLN
INTRAMUSCULAR | Status: AC
  Filled 2017-09-17: qty 2

## 2017-09-17 MED ORDER — FENTANYL CITRATE (PF) 100 MCG/2ML IJ SOLN: 25.0000 ug | INTRAMUSCULAR | Status: DC | PRN

## 2017-09-17 MED ORDER — HYDROMORPHONE HCL 1 MG/ML IJ SOLN
INTRAMUSCULAR | Status: DC | PRN
  Administered 2017-09-17: .2 mg via INTRAVENOUS

## 2017-09-17 MED ORDER — LIDOCAINE HCL (PF) 2 % IJ SOLN
INTRAMUSCULAR | Status: AC
  Filled 2017-09-17: qty 10

## 2017-09-17 MED ORDER — ONDANSETRON HCL 4 MG/2ML IJ SOLN
4.0000 mg | Freq: Once | INTRAMUSCULAR | Status: DC | PRN
Start: 2017-09-17 — End: 2017-09-17

## 2017-09-17 MED ORDER — DEXTROSE 5 % IV SOLN
3.0000 g | INTRAVENOUS | Status: DC
  Filled 2017-09-17: qty 3000

## 2017-09-17 MED ORDER — ACETAMINOPHEN 10 MG/ML IV SOLN
INTRAVENOUS | Status: DC | PRN
  Administered 2017-09-17: 1000 mg via INTRAVENOUS

## 2017-09-17 MED ORDER — ROCURONIUM BROMIDE 100 MG/10ML IV SOLN
INTRAVENOUS | Status: DC | PRN
  Administered 2017-09-17: 5 mg via INTRAVENOUS
  Administered 2017-09-17: 20 mg via INTRAVENOUS
  Administered 2017-09-17: 45 mg via INTRAVENOUS

## 2017-09-17 MED ORDER — HYDROCODONE-ACETAMINOPHEN 5-325 MG PO TABS: 1.0000 | ORAL_TABLET | ORAL | 0 refills | Status: DC | PRN

## 2017-09-17 MED ORDER — DEXAMETHASONE SODIUM PHOSPHATE 10 MG/ML IJ SOLN
INTRAMUSCULAR | Status: AC
  Filled 2017-09-17: qty 1

## 2017-09-17 MED ORDER — SODIUM CHLORIDE 0.9 % IV SOLN
INTRAVENOUS | Status: DC
  Administered 2017-09-17: 07:00:00 via INTRAVENOUS

## 2017-09-17 MED ORDER — SUCCINYLCHOLINE CHLORIDE 20 MG/ML IJ SOLN
INTRAMUSCULAR | Status: AC
  Filled 2017-09-17: qty 1

## 2017-09-17 MED ORDER — PROPOFOL 10 MG/ML IV BOLUS
INTRAVENOUS | Status: DC | PRN
  Administered 2017-09-17: 200 mg via INTRAVENOUS

## 2017-09-17 MED ORDER — SUGAMMADEX SODIUM 500 MG/5ML IV SOLN
INTRAVENOUS | Status: DC | PRN
  Administered 2017-09-17: 200 mg via INTRAVENOUS

## 2017-09-17 MED ORDER — MIDAZOLAM HCL 2 MG/2ML IJ SOLN
INTRAMUSCULAR | Status: DC | PRN
  Administered 2017-09-17 (×2): 1 mg via INTRAVENOUS

## 2017-09-17 MED ORDER — KETOROLAC TROMETHAMINE 30 MG/ML IJ SOLN
INTRAMUSCULAR | Status: DC | PRN
  Administered 2017-09-17: 30 mg via INTRAVENOUS

## 2017-09-17 MED ORDER — LACTATED RINGERS IV SOLN
INTRAVENOUS | Status: DC | PRN
  Administered 2017-09-17: 08:00:00 via INTRAVENOUS

## 2017-09-17 MED ORDER — BUPIVACAINE HCL (PF) 0.5 % IJ SOLN
INTRAMUSCULAR | Status: AC
Start: 2017-09-17 — End: ?
  Filled 2017-09-17: qty 30

## 2017-09-17 MED ORDER — LIDOCAINE HCL (CARDIAC) PF 100 MG/5ML IV SOSY
PREFILLED_SYRINGE | INTRAVENOUS | Status: DC | PRN
  Administered 2017-09-17: 100 mg via INTRAVENOUS

## 2017-09-17 MED ORDER — DEXMEDETOMIDINE HCL IN NACL 80 MCG/20ML IV SOLN
INTRAVENOUS | Status: AC
  Filled 2017-09-17: qty 20

## 2017-09-17 MED ORDER — ROCURONIUM BROMIDE 50 MG/5ML IV SOLN
INTRAVENOUS | Status: AC
Start: 2017-09-17 — End: ?
  Filled 2017-09-17: qty 1

## 2017-09-17 MED ORDER — BUPIVACAINE HCL 0.5 % IJ SOLN
INTRAMUSCULAR | Status: DC | PRN
  Administered 2017-09-17: 30 mL

## 2017-09-17 SURGICAL SUPPLY — 34 items
BLADE SURG 15 STRL SS SAFETY (BLADE) ×3 IMPLANT
CANISTER SUCT 1200ML W/VALVE (MISCELLANEOUS) ×3 IMPLANT
CHLORAPREP W/TINT 26ML (MISCELLANEOUS) ×3 IMPLANT
CLOSURE WOUND 1/2 X4 (GAUZE/BANDAGES/DRESSINGS) ×1
DRAPE LAPAROTOMY 100X77 ABD (DRAPES) ×3 IMPLANT
DRSG OPSITE POSTOP 4X6 (GAUZE/BANDAGES/DRESSINGS) ×3 IMPLANT
DRSG TEGADERM 4X4.75 (GAUZE/BANDAGES/DRESSINGS) ×3 IMPLANT
DRSG TELFA 4X3 1S NADH ST (GAUZE/BANDAGES/DRESSINGS) ×3 IMPLANT
ELECT REM PT RETURN 9FT ADLT (ELECTROSURGICAL) ×3
ELECTRODE REM PT RTRN 9FT ADLT (ELECTROSURGICAL) ×1 IMPLANT
GLOVE BIO SURGEON STRL SZ7.5 (GLOVE) ×3 IMPLANT
GLOVE INDICATOR 8.0 STRL GRN (GLOVE) ×3 IMPLANT
GOWN STRL REUS W/ TWL LRG LVL3 (GOWN DISPOSABLE) ×2 IMPLANT
GOWN STRL REUS W/TWL LRG LVL3 (GOWN DISPOSABLE) ×4
KIT TURNOVER KIT A (KITS) ×3 IMPLANT
LABEL OR SOLS (LABEL) ×3 IMPLANT
MESH VENTRALEX ST 8CM LRG (Mesh General) ×3 IMPLANT
NEEDLE HYPO 22GX1.5 SAFETY (NEEDLE) ×6 IMPLANT
NEEDLE HYPO 25X1 1.5 SAFETY (NEEDLE) ×3 IMPLANT
NS IRRIG 500ML POUR BTL (IV SOLUTION) ×3 IMPLANT
PACK BASIN MINOR ARMC (MISCELLANEOUS) ×3 IMPLANT
RETRACTOR RING XSMALL (MISCELLANEOUS) ×1 IMPLANT
RTRCTR WOUND ALEXIS 13CM XS SH (MISCELLANEOUS) ×3
STRIP CLOSURE SKIN 1/2X4 (GAUZE/BANDAGES/DRESSINGS) ×2 IMPLANT
SUT PROLENE 0 CT 1 30 (SUTURE) ×3 IMPLANT
SUT SURGILON 0 BLK (SUTURE) ×6 IMPLANT
SUT VIC AB 3-0 54X BRD REEL (SUTURE) ×2 IMPLANT
SUT VIC AB 3-0 BRD 54 (SUTURE) ×4
SUT VIC AB 3-0 SH 27 (SUTURE) ×2
SUT VIC AB 3-0 SH 27X BRD (SUTURE) ×1 IMPLANT
SUT VIC AB 4-0 FS2 27 (SUTURE) ×3 IMPLANT
SWABSTK COMLB BENZOIN TINCTURE (MISCELLANEOUS) ×3 IMPLANT
SYR 10ML LL (SYRINGE) ×3 IMPLANT
SYR 3ML LL SCALE MARK (SYRINGE) ×3 IMPLANT

## 2017-09-17 NOTE — Anesthesia Procedure Notes (Signed)
Procedure Name: Intubation Date/Time: 09/17/2017 7:43 AM Performed by: Justus Memory, CRNA Pre-anesthesia Checklist: Patient identified, Patient being monitored, Timeout performed, Emergency Drugs available and Suction available Patient Re-evaluated:Patient Re-evaluated prior to induction Oxygen Delivery Method: Circle system utilized Preoxygenation: Pre-oxygenation with 100% oxygen Induction Type: IV induction Ventilation: Mask ventilation without difficulty Laryngoscope Size: Mac and 3 Grade View: Grade I Tube type: Oral Tube size: 7.0 mm Number of attempts: 1 Airway Equipment and Method: Stylet Placement Confirmation: ETT inserted through vocal cords under direct vision,  positive ETCO2 and breath sounds checked- equal and bilateral Secured at: 21 cm Tube secured with: Tape Dental Injury: Teeth and Oropharynx as per pre-operative assessment

## 2017-09-17 NOTE — H&P (Signed)
Dawn Lambert [id number] [birth year]     HPI:  27 y.o woman with an umbilical hernia. For elective repair.   Medications Prior to Admission  Medication Sig Dispense Refill Last Dose  . acetaminophen (TYLENOL) 325 MG tablet Take 650 mg by mouth every 6 (six) hours as needed for moderate pain.    Past Month at Unknown time  . Asenapine Maleate (SAPHRIS) 10 MG SUBL Place 10 mg under the tongue 2 (two) times daily.   09/16/2017 at Unknown time  . atorvastatin (LIPITOR) 10 MG tablet Take 10 mg by mouth daily at 6 PM.    09/16/2017 at Unknown time  . carbamazepine (TEGRETOL) 200 MG tablet Take 400 mg by mouth 2 (two) times daily.   09/17/2017 at Unknown time  . cholecalciferol (VITAMIN D) 1000 units tablet Take 1,000 Units by mouth daily.    Taking  . clonazePAM (KLONOPIN) 1 MG tablet Take 1 mg by mouth 2 (two) times daily.   09/17/2017 at Unknown time  . clotrimazole (LOTRIMIN) 1 % cream Apply 1 application topically 2 (two) times daily.   Past Month at Unknown time  . Desoximetasone (TOPICORT) 0.25 % LIQD Apply 1 spray topically See admin instructions. Apply 1 squirt to affected area on skin daily until clear, the as needed for flares. Avoid using on face, groin and axilia.   Past Month at Unknown time  . fluconazole (DIFLUCAN) 150 MG tablet Take 150 mg by mouth See admin instructions. Take  by mouth once, take  again if yeast symptoms return after taking prescribed clindamycin antibiotic   Taking  . Fluocinolone Acetonide Scalp (DERMA-SMOOTHE/FS SCALP) 0.01 % OIL Apply 1-5 application topically See admin instructions. Apply to scalp every week for psoriasis but may increase up to 5 days per week as needed.   Past Week at Unknown time  . fluticasone (FLONASE) 50 MCG/ACT nasal spray Place 2 sprays into the nose daily as needed for allergies.    Past Month at Unknown time  . gabapentin (NEURONTIN) 600 MG tablet Take 600 mg by mouth 3 (three) times daily.   09/17/2017 at Unknown time  .  levothyroxine (SYNTHROID, LEVOTHROID) 175 MCG tablet Take 175 mcg by mouth daily before breakfast.   09/17/2017 at Unknown time  . lisinopril (PRINIVIL,ZESTRIL) 20 MG tablet Take 20 mg by mouth daily.    09/16/2017 at Unknown time  . lithium carbonate 300 MG capsule Take 600 mg by mouth at bedtime.    09/16/2017 at Unknown time  . LORazepam (ATIVAN) 2 MG tablet Take 1 mg by mouth daily as needed for anxiety.    Past Month at Unknown time  . magnesium hydroxide (MILK OF MAGNESIA) 400 MG/5ML suspension Take 30 mLs by mouth at bedtime as needed for moderate constipation.    Past Month at Unknown time  . medroxyPROGESTERone (DEPO-PROVERA) 150 MG/ML injection Inject 150 mg into the muscle See admin instructions. "Every 12 weeks"     . metFORMIN (GLUCOPHAGE) 500 MG tablet Take 500 mg by mouth daily.    09/15/2017  . mometasone (ELOCON) 0.1 % cream Apply 1 application topically daily as needed (Itching of the ears).    Past Week at Unknown time  . Multiple Vitamin (THEREMS PO) Take 1 tablet by mouth daily.   09/16/2017 at Unknown time  . propranolol (INDERAL) 40 MG tablet Take 40 mg by mouth 2 (two) times daily.   09/17/2017 at Unknown time  . QUEtiapine (SEROQUEL XR) 300 MG 24 hr tablet Take 300  mg by mouth at bedtime.   09/16/2017 at Unknown time  . sertraline (ZOLOFT) 100 MG tablet Take 100 mg by mouth daily.    09/17/2017 at Unknown time  . Paliperidone Palmitate ER (INVEGA TRINZA) 819 MG/2.625ML SUSY Inject 819 mg into the muscle every 3 (three) months.      Allergies  Allergen Reactions  . Augmentin [Amoxicillin-Pot Clavulanate] Other (See Comments)    Has patient had a PCN reaction causing immediate rash, facial/tongue/throat swelling, SOB or lightheadedness with hypotension: ______ Has patient had a PCN reaction causing severe rash involving mucus membranes or skin necrosis: _______ Has patient had a PCN reaction that required hospitalization: _______ Has patient had a PCN reaction occurring within the  last 10 years:_______ If all of the above answers are "NO", then may proceed with Cephalosporin use.    Past Medical History:  Diagnosis Date  . Anxiety   . Arnold-Chiari malformation (HCC)   . Atrial septal defect   . Complication of anesthesia    when pt was 11, woke up during surgery (muscle biopsy)  . Constipation   . Contraception management   . Dandruff   . Dermatomyositis (HCC)    age 84  . Diabetes mellitus without complication (HCC)   . Elevated blood pressure   . Hypertension   . Hypothyroidism   . Mental retardation   . Mood disorder (HCC)   . Overweight(278.02)   . Pelvic pain   . Prediabetes    Past Surgical History:  Procedure Laterality Date  . ASD REPAIR     at age 28   Social History   Socioeconomic History  . Marital status: Single    Spouse name: Not on file  . Number of children: Not on file  . Years of education: Not on file  . Highest education level: Not on file  Occupational History  . Not on file  Social Needs  . Financial resource strain: Not on file  . Food insecurity:    Worry: Not on file    Inability: Not on file  . Transportation needs:    Medical: Not on file    Non-medical: Not on file  Tobacco Use  . Smoking status: Never Smoker  . Smokeless tobacco: Never Used  Substance and Sexual Activity  . Alcohol use: No  . Drug use: No  . Sexual activity: Not on file  Lifestyle  . Physical activity:    Days per week: Not on file    Minutes per session: Not on file  . Stress: Not on file  Relationships  . Social connections:    Talks on phone: Not on file    Gets together: Not on file    Attends religious service: Not on file    Active member of club or organization: Not on file    Attends meetings of clubs or organizations: Not on file    Relationship status: Not on file  . Intimate partner violence:    Fear of current or ex partner: Not on file    Emotionally abused: Not on file    Physically abused: Not on file    Forced  sexual activity: Not on file  Other Topics Concern  . Not on file  Social History Narrative  . Not on file   Social History   Social History Narrative  . Not on file     ROS: Negative.     PE: HEENT: Negative. Lungs: Clear. Cardio: RR.  Assessment/Plan:  Proceed with  planned umbilcal hernia repair.   Merrily Pew [facility] 09/17/2017

## 2017-09-17 NOTE — Progress Notes (Signed)
Dr. Byrnett into see 

## 2017-09-17 NOTE — Anesthesia Post-op Follow-up Note (Signed)
Anesthesia QCDR form completed.        

## 2017-09-17 NOTE — Op Note (Signed)
Preoperative diagnosis: Umbilical hernia.  Postoperative diagnosis: Multiple ventral defect.  Operative procedure: Repair of ventral hernia with VentralLex ST 8cm mesh.  Operating Surgeon: Donnalee Curry, MD.  Anesthesia: General endotracheal, Marcaine 0.5%, plain, 30 cc.  Estimated blood loss: Less than 5 cc.  Clinical note: This 27 year old woman has developed a symptomatic umbilical hernia.  Clinically there is a 4-5 cm bulge with a suggested 1 cm fascial defect at rest.  She is admitted for elective repair.  She received Kefzol prior to the procedure.  SCD stockings for DVT prevention.  Operative note: After the induction of general endotracheal anesthesia the abdomen was cleansed with ChloraPrep and draped.  Marcaine was infiltrated for postoperative analgesia.  An infraumbilical incision was made carried down through skin subtendinous tissue with hemostasis achieved by electrocautery.  The umbilical skin was elevated off the underlying hernia sac.  This did measure about 5 cm in diameter coming down to a near 3 cm size fascial defect, significantly larger than noted on an office exam.  The sac was transected at the fascial level.  The undersurface of the fascia was cleared and multiple additional small 3-4 mm defects were noted superiorly.  These were all cleared in the undersurface of the fascia exposed with no additional epigastric defects appreciated.  An 8 cm Ventralex was placed intraperitoneally and transfixed with trans-fascial sutures of 0 Surgilon at the 12, 3, 6 and 9:00 positions.  The mesh was smoothly abutting the overlying fascia.  The midline defect was then closed transversely with interrupted 0 Surgilon sutures that included a bite of the mesh to minimize migration.  These were placed under direct vision and then tied sequentially at the end.  The generous layer of adipose tissue was closed in 2 layers with a running 3-0 Vicryl suture with the first layer adherent to the  underlying fascia.  The skin was closed with a running 4-0 Vicryl subcuticular suture.  A small ulceration, significantly improved from that noted at the time of office visit, was treated with bacitracin ointment followed by Telfa.  An overlying honeycomb dressing was placed.  The patient tolerated the procedure well and was taken to the recovery room in stable condition.

## 2017-09-17 NOTE — Transfer of Care (Signed)
Immediate Anesthesia Transfer of Care Note  Patient: Dawn Lambert  Procedure(s) Performed: HERNIA REPAIR UMBILICAL ADULT (N/A )  Patient Location: PACU  Anesthesia Type:General  Level of Consciousness: sedated  Airway & Oxygen Therapy: Patient Spontanous Breathing and Patient connected to face mask oxygen  Post-op Assessment: Report given to RN and Post -op Vital signs reviewed and stable  Post vital signs: Reviewed and stable  Last Vitals:  Vitals Value Taken Time  BP 106/58 09/17/2017  8:54 AM  Temp    Pulse 67 09/17/2017  9:07 AM  Resp 24 09/17/2017  9:07 AM  SpO2 100 % 09/17/2017  9:07 AM  Vitals shown include unvalidated device data.  Last Pain:  Vitals:   09/17/17 0906  PainSc: 0-No pain         Complications: No apparent anesthesia complications

## 2017-09-17 NOTE — Telephone Encounter (Signed)
Margaret with the patient's group home called answering service to report that the prescription for pain medication prescribed for the patient post surgery today did not go through to the pharmacy. I let her know that someone would need to come by the office today once Dr Lemar Livings is in to pick up a paper prescription. She is amendable to this.

## 2017-09-17 NOTE — Anesthesia Postprocedure Evaluation (Signed)
Anesthesia Post Note  Patient: Dawn Lambert  Procedure(s) Performed: HERNIA REPAIR UMBILICAL ADULT (N/A )  Patient location during evaluation: PACU Anesthesia Type: General Level of consciousness: awake and alert Pain management: pain level controlled Vital Signs Assessment: post-procedure vital signs reviewed and stable Respiratory status: spontaneous breathing and respiratory function stable Cardiovascular status: stable Anesthetic complications: no     Last Vitals:  Vitals:   09/17/17 0924 09/17/17 0936  BP: (!) 110/57 113/68  Pulse: 70 69  Resp: 19 16  Temp:  (!) 35.9 C  SpO2: 95% 98%    Last Pain:  Vitals:   09/17/17 0936  TempSrc: Temporal  PainSc: 0-No pain                 KEPHART,WILLIAM K

## 2017-09-17 NOTE — Anesthesia Postprocedure Evaluation (Signed)
Anesthesia Post Note  Patient: Dawn Lambert  Procedure(s) Performed: HERNIA REPAIR UMBILICAL ADULT (N/A )  Anesthesia Type: General     Last Vitals:  Vitals:   09/17/17 0854 09/17/17 0906  BP: (!) 106/58   Pulse: 71 65  Resp: 16 20  Temp: 36.5 C   SpO2: 98% 100%    Last Pain:  Vitals:   09/17/17 0906  PainSc: 0-No pain                 Arnette Norris

## 2017-09-17 NOTE — Anesthesia Preprocedure Evaluation (Signed)
Anesthesia Evaluation  Patient identified by MRN, date of birth, ID band Patient awake    Reviewed: NPO status , Patient's Chart, lab work & pertinent test results  History of Anesthesia Complications Negative for: history of anesthetic complications  Airway Mallampati: III       Dental   Pulmonary neg sleep apnea, neg COPD,           Cardiovascular hypertension, Pt. on medications (-) Past MI and (-) CHF (-) dysrhythmias (-) Valvular Problems/Murmurs  S/p ASD repair at 27 yo   Neuro/Psych neg Seizures Anxiety    GI/Hepatic Neg liver ROS, neg GERD  ,  Endo/Other  diabetes, Type 2, Oral Hypoglycemic AgentsHypothyroidism   Renal/GU negative Renal ROS     Musculoskeletal   Abdominal   Peds  Hematology   Anesthesia Other Findings   Reproductive/Obstetrics                             Anesthesia Physical Anesthesia Plan  ASA: III  Anesthesia Plan: General   Post-op Pain Management:    Induction: Intravenous  PONV Risk Score and Plan: 3 and Ondansetron, Dexamethasone and Midazolam  Airway Management Planned: Oral ETT  Additional Equipment:   Intra-op Plan:   Post-operative Plan:   Informed Consent: I have reviewed the patients History and Physical, chart, labs and discussed the procedure including the risks, benefits and alternatives for the proposed anesthesia with the patient or authorized representative who has indicated his/her understanding and acceptance.     Plan Discussed with:   Anesthesia Plan Comments:         Anesthesia Quick Evaluation

## 2017-09-20 ENCOUNTER — Encounter: Payer: Self-pay | Admitting: General Surgery

## 2017-09-23 ENCOUNTER — Emergency Department: Payer: Medicaid Other

## 2017-09-23 ENCOUNTER — Emergency Department
Admission: EM | Admit: 2017-09-23 | Discharge: 2017-09-23 | Disposition: A | Discharge status: Home / Self Care | Payer: Medicaid Other | Attending: Emergency Medicine | Admitting: Emergency Medicine

## 2017-09-23 ENCOUNTER — Encounter: Payer: Self-pay | Admitting: Emergency Medicine

## 2017-09-23 ENCOUNTER — Other Ambulatory Visit: Payer: Self-pay

## 2017-09-23 DIAGNOSIS — E119 Type 2 diabetes mellitus without complications: Secondary | ICD-10-CM | POA: Insufficient documentation

## 2017-09-23 DIAGNOSIS — G8918 Other acute postprocedural pain: Secondary | ICD-10-CM | POA: Diagnosis present

## 2017-09-23 DIAGNOSIS — F79 Unspecified intellectual disabilities: Secondary | ICD-10-CM | POA: Diagnosis not present

## 2017-09-23 DIAGNOSIS — E039 Hypothyroidism, unspecified: Secondary | ICD-10-CM | POA: Diagnosis not present

## 2017-09-23 DIAGNOSIS — I10 Essential (primary) hypertension: Secondary | ICD-10-CM | POA: Insufficient documentation

## 2017-09-23 LAB — CBC
HCT: 34.1 % — ABNORMAL LOW (ref 35.0–47.0)
Hemoglobin: 11.5 g/dL — ABNORMAL LOW (ref 12.0–16.0)
MCH: 28.9 pg (ref 26.0–34.0)
MCHC: 33.8 g/dL (ref 32.0–36.0)
MCV: 85.5 fL (ref 80.0–100.0)
Platelets: 294 10*3/uL (ref 150–440)
RBC: 3.99 MIL/uL (ref 3.80–5.20)
RDW: 14.2 % (ref 11.5–14.5)
WBC: 9.7 10*3/uL (ref 3.6–11.0)

## 2017-09-23 LAB — COMPREHENSIVE METABOLIC PANEL
ALT: 17 U/L (ref 14–54)
AST: 20 U/L (ref 15–41)
Albumin: 3.6 g/dL (ref 3.5–5.0)
Alkaline Phosphatase: 81 U/L (ref 38–126)
Anion gap: 6 (ref 5–15)
BUN: 8 mg/dL (ref 6–20)
CO2: 25 mmol/L (ref 22–32)
Calcium: 8.8 mg/dL — ABNORMAL LOW (ref 8.9–10.3)
Chloride: 106 mmol/L (ref 101–111)
Creatinine, Ser: 0.54 mg/dL (ref 0.44–1.00)
GFR calc Af Amer: >60 mL/min (ref >60)
GFR calc non Af Amer: >60 mL/min (ref >60)
Glucose, Bld: 120 mg/dL — ABNORMAL HIGH (ref 65–99)
Potassium: 3.9 mmol/L (ref 3.5–5.1)
Sodium: 137 mmol/L (ref 135–145)
Total Bilirubin: 0.3 mg/dL (ref 0.3–1.2)
Total Protein: 7.4 g/dL (ref 6.5–8.1)

## 2017-09-23 LAB — POC URINE PREG, ED: Preg Test, Ur: NEGATIVE

## 2017-09-23 MED ORDER — OXYCODONE-ACETAMINOPHEN 5-325 MG PO TABS
2.0000 | ORAL_TABLET | Freq: Once | ORAL | Status: AC
  Administered 2017-09-23: 2 via ORAL
  Filled 2017-09-23: qty 2

## 2017-09-23 NOTE — ED Triage Notes (Signed)
Pt reports she had hernia surgery this past Monday reports pain to surgical area increases when she walks, reports has been taking Tylenol. Pt lives in a group.

## 2017-09-23 NOTE — ED Notes (Signed)
Pt states had hernia surgery on Monday, since then the group home has been making her lift heavy jugs and walking. Pt also c/o pain with walking. Pt with well healing surgical incision distal to her umbilicus. Pt states has been picking at site due to pain. Pt is alert and oriented at baseline.

## 2017-09-23 NOTE — ED Notes (Signed)
First Nurse Note: Pt states that she had surgery Monday for hernia repair. Pt states that she was picking at incision and messing with it, also states that she has been doing more than she should. Pt feels like the incision is swollen and irritated.

## 2017-09-23 NOTE — ED Notes (Signed)
Discussed with Dr. Fanny Bien pt's presentation verbal order received and acknowledged

## 2017-09-23 NOTE — ED Provider Notes (Signed)
[facility] Emergency Department Provider Note       Time seen: ----------------------------------------- 10:37 AM on 09/23/2017 -----------------------------------------   I have reviewed the triage vital signs and the nursing notes.  HISTORY   Chief Complaint Post-op Problem    HPI Dawn Lambert is a 27 y.o. female with a history of anxiety, ASD, dermatomyositis, diabetes, hypertension, mental retardation who presents to the ED for abdominal pain.  Patient reports she had a surgery on Monday for hernia repair, this was a periumbilical hernia.  Patient states she was picking at the incision more than she should have, she was also forced at her group home to do some heavy lifting despite being advised not to do any heavy lifting.  Pain she states increases when she walks.  Past Medical History:  Diagnosis Date  . Anxiety   . Arnold-Chiari malformation (HCC)   . Atrial septal defect   . Complication of anesthesia    when pt was 11, woke up during surgery (muscle biopsy)  . Constipation   . Contraception management   . Dandruff   . Dermatomyositis (HCC)    age 30  . Diabetes mellitus without complication (HCC)   . Elevated blood pressure   . Hypertension   . Hypothyroidism   . Mental retardation   . Mood disorder (HCC)   . Overweight(278.02)   . Pelvic pain   . Prediabetes     Patient Active Problem List   Diagnosis Date Noted  . Umbilical hernia without obstruction and without gangrene 08/15/2017  . Chest pain 11/05/2012  . Atrial septal defect 09/19/2011    Past Surgical History:  Procedure Laterality Date  . ASD REPAIR     at age 84  . UMBILICAL HERNIA REPAIR N/A 09/17/2017   Procedure: HERNIA REPAIR UMBILICAL ADULT;  Surgeon: Earline Mayotte, MD;  Location: [facility] ORS;  Service: General;  Laterality: N/A;    Allergies Augmentin [amoxicillin-pot clavulanate]  Social History Social History   Tobacco Use  . Smoking status:  Never Smoker  . Smokeless tobacco: Never Used  Substance Use Topics  . Alcohol use: No  . Drug use: No   Review of Systems Constitutional: Negative for fever. Cardiovascular: Negative for chest pain. Respiratory: Negative for shortness of breath. Gastrointestinal: Positive for abdominal pain Musculoskeletal: Negative for back pain. Skin: Positive for skin incision around the umbilicus Neurological: Negative for headaches, focal weakness or numbness.  All systems negative/normal/unremarkable except as stated in the HPI  ____________________________________________   PHYSICAL EXAM:  VITAL SIGNS: ED Triage Vitals  Enc Vitals Group     BP 09/23/17 1017 99/83     Pulse Rate 09/23/17 1017 77     Resp 09/23/17 1017 16     Temp 09/23/17 1017 98.8 F (37.1 C)     Temp Source 09/23/17 1017 Oral     SpO2 09/23/17 1017 98 %     Weight 09/23/17 1025 275 lb (124.7 kg)     Height 09/23/17 1025  (1.575 m)     Head Circumference --      Peak Flow --      Pain Score 09/23/17 1024 9     Pain Loc --      Pain Edu? --      Excl. in GC? --    Constitutional: Alert and oriented. Well appearing and in no distress. Eyes: Conjunctivae are normal. Normal extraocular movements. Cardiovascular: Normal rate, regular rhythm. No murmurs, rubs, or gallops. Respiratory: Normal  respiratory effort without tachypnea nor retractions. Breath sounds are clear and equal bilaterally. No wheezes/rales/rhonchi. Gastrointestinal: Nonfocal tenderness, postoperative changes with a crescent-shaped incision made over the inferior aspect of the umbilicus.  There is some ecchymosis on the abdominal wall, no abscess or cellulitis formation.  No severe or focal tenderness is noted Musculoskeletal: Nontender with normal range of motion in extremities. No lower extremity tenderness nor edema. Neurologic:  Normal speech and language. No gross focal neurologic deficits are appreciated.  Skin:  Skin is warm, dry and  intact. No rash noted. Psychiatric: Mood and affect are normal. Speech and behavior are normal.  ____________________________________________  ED COURSE:  As part of my medical decision making, I reviewed the following data within the electronic MEDICAL RECORD NUMBER History obtained from family if available, nursing notes, old chart and ekg, as well as notes from prior ED visits. Patient presented for abdominal pain, we will assess with labs and imaging as indicated at this time.   Procedures ____________________________________________   LABS (pertinent positives/negatives)  Labs Reviewed  CBC - Abnormal; Notable for the following components:      Result Value   Hemoglobin 11.5 (*)    HCT 34.1 (*)    All other components within normal limits  COMPREHENSIVE METABOLIC PANEL - Abnormal; Notable for the following components:   Glucose, Bld 120 (*)    Calcium 8.8 (*)    All other components within normal limits  POC URINE PREG, ED    RADIOLOGY  Abdomen 2 view Is unremarkable ____________________________________________  DIFFERENTIAL DIAGNOSIS   Postoperative pain, adhesions, hematoma, postoperative infection  FINAL ASSESSMENT AND PLAN  Abdominal pain, postoperative pain   Plan: The patient had presented for abdominal pain with a recent hernia repair. Patient's labs are unremarkable. Patient's imaging are also unremarkable.  She is advised to have no heavy eating.  She is cleared for outpatient follow-up.   Ulice Dash, MD   Note: This note was generated in part or whole with voice recognition software. Voice recognition is usually quite accurate but there are transcription errors that can and very often do occur. I apologize for any typographical errors that were not detected and corrected.     Emily Filbert, MD 09/23/17 [phone]

## 2017-09-23 NOTE — ED Notes (Signed)
RN able to contact group home. They report they will be here in 15 minutes to take pt home.

## 2017-09-23 NOTE — ED Notes (Signed)
Group home member arrived to take pt home. Pt ambulatory and in NAD. Discharge papers and home care explained.

## 2017-09-23 NOTE — ED Notes (Signed)
RN called group home to find group home leaders that are no longer at bedside. No one located in the lobby and no one in the room. No answer on number that pt gave to RN and a mailbox was not set up. RN will call mother of pt for further guidance.

## 2017-09-25 ENCOUNTER — Encounter: Payer: Self-pay | Admitting: General Surgery

## 2017-09-25 ENCOUNTER — Ambulatory Visit (INDEPENDENT_AMBULATORY_CARE_PROVIDER_SITE_OTHER): Payer: Medicaid Other | Admitting: General Surgery

## 2017-09-25 VITALS — BP 108/70 | HR 77 | Resp 14 | Ht 60.0 in | Wt 282.0 lb

## 2017-09-25 DIAGNOSIS — K429 Umbilical hernia without obstruction or gangrene: Secondary | ICD-10-CM

## 2017-09-25 NOTE — Patient Instructions (Addendum)
No heavy lifting for the next two weeks No more than 10 pounds. Return in three weeks.  Patient has been instructed to make use of Neosporin ointment to the area after daily showers.

## 2017-09-25 NOTE — Progress Notes (Signed)
Patient ID: Dawn Lambert, female   DOB: [birth year], 27 y.o.   MRN: [id number]  Chief Complaint  Patient presents with  . Routine Post Op    HPI Dawn Lambert is a 27 y.o. female here today for her post op umbilical hernia repair done on 09/17/2017. Patient states she the area is very tender. Patient was seen in the ER on 09/23/2017 for pain. She states the picked up a gallon of milk.  HPI  Past Medical History:  Diagnosis Date  . Anxiety   . Arnold-Chiari malformation (HCC)   . Atrial septal defect   . Complication of anesthesia    when pt was 11, woke up during surgery (muscle biopsy)  . Constipation   . Contraception management   . Dandruff   . Dermatomyositis (HCC)    age 13  . Diabetes mellitus without complication (HCC)   . Elevated blood pressure   . Hypertension   . Hypothyroidism   . Mental retardation   . Mood disorder (HCC)   . Overweight(278.02)   . Pelvic pain   . Prediabetes     Past Surgical History:  Procedure Laterality Date  . ASD REPAIR     at age 52  . UMBILICAL HERNIA REPAIR N/A 09/17/2017   Procedure: HERNIA REPAIR UMBILICAL ADULT;  Surgeon: Earline Mayotte, MD;  Location: [facility] ORS;  Service: General;  Laterality: N/A;    No family history on file.  Social History Social History   Tobacco Use  . Smoking status: Never Smoker  . Smokeless tobacco: Never Used  Substance Use Topics  . Alcohol use: No  . Drug use: No    Allergies  Allergen Reactions  . Augmentin [Amoxicillin-Pot Clavulanate] Other (See Comments)    Has patient had a PCN reaction causing immediate rash, facial/tongue/throat swelling, SOB or lightheadedness with hypotension: ______ Has patient had a PCN reaction causing severe rash involving mucus membranes or skin necrosis: _______ Has patient had a PCN reaction that required hospitalization: _______ Has patient had a PCN reaction occurring within the last 10 years:_______ If all of the above answers are "NO", then may  proceed with Cephalosporin use.     Current Outpatient Medications  Medication Sig Dispense Refill  . acetaminophen (TYLENOL) 325 MG tablet Take 650 mg by mouth every 6 (six) hours as needed for moderate pain.     . Asenapine Maleate (SAPHRIS) 10 MG SUBL Place 10 mg under the tongue 2 (two) times daily.    [name] atorvastatin (LIPITOR) 10 MG tablet Take 10 mg by mouth daily at 6 PM.     . carbamazepine (TEGRETOL) 200 MG tablet Take 400 mg by mouth 2 (two) times daily.    . cholecalciferol (VITAMIN D) 1000 units tablet Take 1,000 Units by mouth daily.     . clonazePAM (KLONOPIN) 1 MG tablet Take 1 mg by mouth 2 (two) times daily.    . clotrimazole (LOTRIMIN) 1 % cream Apply 1 application topically 2 (two) times daily.    . fluconazole (DIFLUCAN) 150 MG tablet Take 150 mg by mouth See admin instructions. Take  by mouth once, take  again if yeast symptoms return after taking prescribed clindamycin antibiotic    . Fluocinolone Acetonide Scalp (DERMA-SMOOTHE/FS SCALP) 0.01 % OIL Apply 1-5 application topically See admin instructions. Apply to scalp every week for psoriasis but may increase up to 5 days per week as needed.    . fluticasone (FLONASE) 50 MCG/ACT nasal spray Place 2 sprays into  the nose daily as needed for allergies.     [name] gabapentin (NEURONTIN) 600 MG tablet Take 600 mg by mouth 3 (three) times daily.    [name] HYDROcodone-acetaminophen (NORCO/VICODIN) 5-325 MG tablet Take 1 tablet by mouth every 4 (four) hours as needed for moderate pain. 15 tablet 0  . levothyroxine (SYNTHROID, LEVOTHROID) 175 MCG tablet Take 175 mcg by mouth daily before breakfast.    . lisinopril (PRINIVIL,ZESTRIL) 20 MG tablet Take 20 mg by mouth daily.     [name] lithium carbonate 300 MG capsule Take 600 mg by mouth at bedtime.     [name] LORazepam (ATIVAN) 1 MG tablet Take 1 mg by mouth daily as needed for anxiety.     . magnesium hydroxide (MILK OF MAGNESIA) 400 MG/5ML suspension Take 30 mLs by mouth at bedtime as needed  for moderate constipation.     . medroxyPROGESTERone (DEPO-PROVERA) 150 MG/ML injection Inject 150 mg into the muscle See admin instructions. "Every 12 weeks"    . metFORMIN (GLUCOPHAGE) 500 MG tablet Take 500 mg by mouth daily.     . mometasone (ELOCON) 0.1 % cream Apply 1 application topically daily as needed (Itching of the ears).     . Multiple Vitamin (THEREMS PO) Take 1 tablet by mouth daily.    . Paliperidone Palmitate ER (INVEGA TRINZA) 819 MG/2.625ML SUSY Inject 819 mg into the muscle every 3 (three) months.    . propranolol (INDERAL) 40 MG tablet Take 40 mg by mouth 2 (two) times daily.    . QUEtiapine (SEROQUEL XR) 300 MG 24 hr tablet Take 300 mg by mouth at bedtime.    . sertraline (ZOLOFT) 100 MG tablet Take 100 mg by mouth daily.      No current facility-administered medications for this visit.     Review of Systems Review of Systems  Constitutional: Negative.   Respiratory: Negative.   Cardiovascular: Negative.     Blood pressure 108/70, pulse 77, resp. rate 14, height 5' (1.524 m), weight 282 lb (127.9 kg).  Physical Exam Physical Exam  Constitutional: She is oriented to person, place, and time. She appears well-developed and well-nourished.  Abdominal:    Neurological: She is alert and oriented to person, place, and time.  Skin: Skin is warm and dry.    Data Reviewed Emergency department record.  Assessment    Umbilical hernia with mesh repair.  Superficial trauma by patient manipulation.    Plan  No heavy lifting for the next two weeks. Return in three weeks.  Patient has been instructed to make use of Neosporin ointment to the area after daily showers.  HPI, Physical Exam, Assessment and Plan have been scribed under the direction and in the presence of Donnalee Curry, MD.  Ples Specter, CMA   I have completed the exam and reviewed the above documentation for accuracy and completeness.  I agree with the above.  Museum/gallery conservator has been used  and any errors in dictation or transcription are unintentional.  Donnalee Curry, M.D., F.A.C.S.  Dawn Lambert 09/25/2017, 10:01 AM

## 2017-10-05 ENCOUNTER — Encounter: Payer: Self-pay | Admitting: Emergency Medicine

## 2017-10-05 ENCOUNTER — Other Ambulatory Visit: Payer: Self-pay

## 2017-10-05 ENCOUNTER — Emergency Department
Admission: EM | Admit: 2017-10-05 | Discharge: 2017-10-07 | Disposition: A | Discharge status: Home / Self Care | Payer: Medicaid Other | Attending: Emergency Medicine | Admitting: Emergency Medicine

## 2017-10-05 DIAGNOSIS — E119 Type 2 diabetes mellitus without complications: Secondary | ICD-10-CM | POA: Diagnosis not present

## 2017-10-05 DIAGNOSIS — E039 Hypothyroidism, unspecified: Secondary | ICD-10-CM | POA: Diagnosis not present

## 2017-10-05 DIAGNOSIS — Z79899 Other long term (current) drug therapy: Secondary | ICD-10-CM | POA: Diagnosis not present

## 2017-10-05 DIAGNOSIS — F329 Major depressive disorder, single episode, unspecified: Secondary | ICD-10-CM

## 2017-10-05 DIAGNOSIS — I10 Essential (primary) hypertension: Secondary | ICD-10-CM | POA: Insufficient documentation

## 2017-10-05 DIAGNOSIS — F79 Unspecified intellectual disabilities: Secondary | ICD-10-CM | POA: Diagnosis not present

## 2017-10-05 DIAGNOSIS — F32A Depression, unspecified: Secondary | ICD-10-CM

## 2017-10-05 DIAGNOSIS — Z7984 Long term (current) use of oral hypoglycemic drugs: Secondary | ICD-10-CM | POA: Insufficient documentation

## 2017-10-05 LAB — COMPREHENSIVE METABOLIC PANEL
ALT: 19 U/L (ref 14–54)
AST: 19 U/L (ref 15–41)
Albumin: 4 g/dL (ref 3.5–5.0)
Alkaline Phosphatase: 88 U/L (ref 38–126)
Anion gap: 11 (ref 5–15)
BUN: 10 mg/dL (ref 6–20)
CO2: 26 mmol/L (ref 22–32)
Calcium: 9.1 mg/dL (ref 8.9–10.3)
Chloride: 100 mmol/L — ABNORMAL LOW (ref 101–111)
Creatinine, Ser: 0.66 mg/dL (ref 0.44–1.00)
GFR calc Af Amer: >60 mL/min (ref >60)
GFR calc non Af Amer: >60 mL/min (ref >60)
Glucose, Bld: 137 mg/dL — ABNORMAL HIGH (ref 65–99)
Potassium: 3.9 mmol/L (ref 3.5–5.1)
Sodium: 137 mmol/L (ref 135–145)
Total Bilirubin: 0.2 mg/dL — ABNORMAL LOW (ref 0.3–1.2)
Total Protein: 7.9 g/dL (ref 6.5–8.1)

## 2017-10-05 LAB — CBC
HCT: 37.3 % (ref 35.0–47.0)
Hemoglobin: 12.4 g/dL (ref 12.0–16.0)
MCH: 28.5 pg (ref 26.0–34.0)
MCHC: 33.2 g/dL (ref 32.0–36.0)
MCV: 86 fL (ref 80.0–100.0)
Platelets: 309 10*3/uL (ref 150–440)
RBC: 4.33 MIL/uL (ref 3.80–5.20)
RDW: 14.5 % (ref 11.5–14.5)
WBC: 11.7 10*3/uL — ABNORMAL HIGH (ref 3.6–11.0)

## 2017-10-05 LAB — POCT PREGNANCY, URINE
Preg Test, Ur: NEGATIVE
Preg Test, Ur: NEGATIVE

## 2017-10-05 LAB — SALICYLATE LEVEL: Salicylate Lvl: <7 mg/dL (ref 2.8–30.0)

## 2017-10-05 LAB — ETHANOL: Alcohol, Ethyl (B): <10 mg/dL (ref <10)

## 2017-10-05 LAB — ACETAMINOPHEN LEVEL: Acetaminophen (Tylenol), Serum: <10 ug/mL — ABNORMAL LOW (ref 10–30)

## 2017-10-05 LAB — URINE DRUG SCREEN, QUALITATIVE (ARMC ONLY)
Amphetamines, Ur Screen: NOT DETECTED
Barbiturates, Ur Screen: NOT DETECTED
Benzodiazepine, Ur Scrn: POSITIVE — AB
Cannabinoid 50 Ng, Ur ~~LOC~~: NOT DETECTED
Cocaine Metabolite,Ur ~~LOC~~: NOT DETECTED
MDMA (Ecstasy)Ur Screen: NOT DETECTED
Methadone Scn, Ur: NOT DETECTED
Opiate, Ur Screen: NOT DETECTED
Phencyclidine (PCP) Ur S: NOT DETECTED
Tricyclic, Ur Screen: POSITIVE — AB

## 2017-10-05 NOTE — ED Notes (Signed)
[facility]  [facility]

## 2017-10-05 NOTE — ED Notes (Signed)
Patient received PM snack. 

## 2017-10-05 NOTE — ED Notes (Addendum)
Pt. States "I don't to go back to group home".  Pt. Got upset today and broke some property at group home, patient also went outside and exposed herself.  Pt. Calm and cooperative at this time.  Pt. States she has been at group home for 7 years.  Pt. States "I am not able to do the things I was before and they talk back at me".  Pt. States "I am suicidal if I have to go back to same group home."

## 2017-10-05 NOTE — ED Notes (Signed)
Pt's mother Dawn Lambert called and notified of patient's arrival to ED and that patient is currently under IVC and patient's password [id number].

## 2017-10-05 NOTE — ED Triage Notes (Signed)
Pt presents to ED via [facility] under IVC. Pt states "I'm going to do everything I can to leave my group home, I don't care if it end me up in a state hospital at this point". Pt admits to "destroying the house and going to the side of the road and pulling [her] pants down so everyone can see it, [her] private parts". Pt also admits plan to hurt herself by "inserting things into [her] bottom and pulling open [her] stitches from surgery". Pt had hernia repair 5/20, was seen on 5/26, pt with well healing surgical site to umbilical area. Pt is from [facility] group home, pt states she wants to leave her group home because they "aren't good to [her], they ignore [her], and when they do they raise their voice".

## 2017-10-05 NOTE — ED Notes (Signed)
1 pair white slippers, 1 black shirt, 1 white sports bra, 1 pair black underwear, 1 pair black pants, 1 pink fitbit, 1 juice candy,  1 yellow metal ring with light purple stone, 1 yellow metal ring with dark purple stone surrounded by clear stones, 1 yellow metal ring with dark purple stone, 1 yellow metal ring with red stone surrounded by clear stones.

## 2017-10-05 NOTE — ED Notes (Signed)
[facility]. Finished [facility], [facility]

## 2017-10-05 NOTE — ED Provider Notes (Addendum)
[facility] Emergency Department Provider Note  ____________________________________________   I have reviewed the triage vital signs and the nursing notes. Where available I have reviewed prior notes and, if possible and indicated, outside hospital notes.    HISTORY  Chief Complaint Psychiatric Evaluation    HPI Dawn Lambert is a 27 y.o. female  Who presents here under IVC paperwork for acting strangely at the group home.  Has a history of mental retardation, obesity, mood disorder.  Patient's making some unusual threats.  She does not like her living arrangements because she feels people are nicer there and so she has elected to expose the genitalia at them and also threatening but not actually follow through on the plan to place items in her vagina.  She also will pick at her sutures until they open she states.  She states she has not actually put anything in her vagina.  She is just dissatisfied with the place she stays and is making threats to harm herself if she goes back.     Past Medical History:  Diagnosis Date  . Anxiety   . Arnold-Chiari malformation (HCC)   . Atrial septal defect   . Complication of anesthesia    when pt was 11, woke up during surgery (muscle biopsy)  . Constipation   . Contraception management   . Dandruff   . Dermatomyositis (HCC)    age 3  . Diabetes mellitus without complication (HCC)   . Elevated blood pressure   . Hypertension   . Hypothyroidism   . Mental retardation   . Mood disorder (HCC)   . Overweight(278.02)   . Pelvic pain   . Prediabetes     Patient Active Problem List   Diagnosis Date Noted  . Umbilical hernia without obstruction and without gangrene 08/15/2017  . Chest pain 11/05/2012  . Atrial septal defect 09/19/2011    Past Surgical History:  Procedure Laterality Date  . ASD REPAIR     at age 31  . UMBILICAL HERNIA REPAIR N/A 09/17/2017   Procedure: HERNIA REPAIR UMBILICAL ADULT;   Surgeon: Earline Mayotte, MD;  Location: [facility] ORS;  Service: General;  Laterality: N/A;    Prior to Admission medications   Medication Sig Start Date End Date Taking? Authorizing Provider  acetaminophen (TYLENOL) 325 MG tablet Take 650 mg by mouth every 6 (six) hours as needed for moderate pain.     [provider]  Asenapine Maleate (SAPHRIS) 10 MG SUBL Place 10 mg under the tongue 2 (two) times daily.    [provider]  atorvastatin (LIPITOR) 10 MG tablet Take 10 mg by mouth daily at 6 PM.     [provider]  carbamazepine (TEGRETOL) 200 MG tablet Take 400 mg by mouth 2 (two) times daily.    [provider]  cholecalciferol (VITAMIN D) 1000 units tablet Take 1,000 Units by mouth daily.     [provider]  clonazePAM (KLONOPIN) 1 MG tablet Take 1 mg by mouth 2 (two) times daily.    [provider]  clotrimazole (LOTRIMIN) 1 % cream Apply 1 application topically 2 (two) times daily.    [provider]  fluconazole (DIFLUCAN) 150 MG tablet Take 150 mg by mouth See admin instructions. Take 150mg  by mouth once, take 150mg  again if yeast symptoms return after taking prescribed clindamycin antibiotic    [provider]  Fluocinolone Acetonide Scalp (DERMA-SMOOTHE/FS SCALP) 0.01 % OIL Apply 1-5 application topically See admin instructions.  Apply to scalp every week for psoriasis but may increase up to 5 days per week as needed.    [provider]  fluticasone (FLONASE) 50 MCG/ACT nasal spray Place 2 sprays into the nose daily as needed for allergies.     [provider]  gabapentin (NEURONTIN) 600 MG tablet Take 600 mg by mouth 3 (three) times daily.    [provider]  HYDROcodone-acetaminophen (NORCO/VICODIN) 5-325 MG tablet Take 1 tablet by mouth every 4 (four) hours as needed for moderate pain. 09/17/17 09/17/18  [name], [name], MD  levothyroxine (SYNTHROID, LEVOTHROID) 175 MCG tablet Take 175  mcg by mouth daily before breakfast.    [provider]  lisinopril (PRINIVIL,ZESTRIL) 20 MG tablet Take 20 mg by mouth daily.     [provider]  lithium carbonate 300 MG capsule Take 600 mg by mouth at bedtime.     [provider]  LORazepam (ATIVAN) 1 MG tablet Take 1 mg by mouth daily as needed for anxiety.     [provider]  magnesium hydroxide (MILK OF MAGNESIA) 400 MG/5ML suspension Take 30 mLs by mouth at bedtime as needed for moderate constipation.     [provider]  medroxyPROGESTERone (DEPO-PROVERA) 150 MG/ML injection Inject 150 mg into the muscle See admin instructions. "Every 12 weeks"    [provider]  metFORMIN (GLUCOPHAGE) 500 MG tablet Take 500 mg by mouth daily.     [provider]  mometasone (ELOCON) 0.1 % cream Apply 1 application topically daily as needed (Itching of the ears).     [provider]  Multiple Vitamin (THEREMS PO) Take 1 tablet by mouth daily.    [provider]  Paliperidone Palmitate ER (INVEGA TRINZA) 819 MG/2.625ML SUSY Inject 819 mg into the muscle every 3 (three) months.    [provider]  propranolol (INDERAL) 40 MG tablet Take 40 mg by mouth 2 (two) times daily.    [provider]  QUEtiapine (SEROQUEL XR) 300 MG 24 hr tablet Take 300 mg by mouth at bedtime.    [provider]  sertraline (ZOLOFT) 100 MG tablet Take 100 mg by mouth daily.     [provider]    Allergies Augmentin [amoxicillin-pot clavulanate]  History reviewed. No pertinent family history.  Social History Social History   Tobacco Use  . Smoking status: Never Smoker  . Smokeless tobacco: Never Used  Substance Use Topics  . Alcohol use: No  . Drug use: No    Review of Systems Constitutional: No fever/chills Eyes: No visual changes. ENT: No sore throat. No stiff neck no neck pain Cardiovascular: Denies chest pain. Respiratory: Denies shortness of  breath. Gastrointestinal:   no vomiting.  No diarrhea.  No constipation. Genitourinary: Negative for dysuria. Musculoskeletal: Negative lower extremity swelling Skin: Negative for rash. Neurological: Negative for severe headaches, focal weakness or numbness.   ____________________________________________   PHYSICAL EXAM:  VITAL SIGNS: ED Triage Vitals [10/05/17 1847]  Enc Vitals Group     BP      Pulse      Resp      Temp      Temp src      SpO2      Weight 278 lb (126.1 kg)     Height 5\' 2"  (1.575 m)     Head Circumference      Peak Flow      Pain Score 0     Pain Loc  Pain Edu?      Excl. in GC?     Constitutional: Alert and oriented. Well appearing and in no acute distress. Eyes: Conjunctivae are normal Head: Atraumatic HEENT: No congestion/rhinnorhea. Mucous membranes are moist.  Oropharynx non-erythematous Neck:   Nontender with no meningismus, no masses, no stridor Cardiovascular: Normal rate, regular rhythm. Grossly normal heart sounds.  Good peripheral circulation. Respiratory: Normal respiratory effort.  No retractions. Lungs CTAB. Abdominal: Soft and nontender. No distention. No guarding no rebound surgical site is clean dry and intact and nontender with good healing Back:  There is no focal tenderness or step off.  there is no midline tenderness there are no lesions noted. there is no CVA tenderness  Musculoskeletal: No lower extremity tenderness, no upper extremity tenderness. No joint effusions, no DVT signs strong distal pulses no edema Neurologic:  Normal speech and language. No gross focal neurologic deficits are appreciated.  Skin:  Skin is warm, dry and intact. No rash noted. Psychiatric: Mood and affect are little bit bizarre with strange threats to do strange things to herself ____________________________________________   LABS (all labs ordered are listed, but only abnormal results are displayed)  Labs Reviewed  COMPREHENSIVE METABOLIC  PANEL - Abnormal; Notable for the following components:      Result Value   Chloride 100 (*)    Glucose, Bld 137 (*)    Total Bilirubin 0.2 (*)    All other components within normal limits  ACETAMINOPHEN LEVEL - Abnormal; Notable for the following components:   Acetaminophen (Tylenol), Serum <10 (*)    All other components within normal limits  CBC - Abnormal; Notable for the following components:   WBC 11.7 (*)    All other components within normal limits  URINE DRUG SCREEN, QUALITATIVE ([facility] ONLY) - Abnormal; Notable for the following components:   Tricyclic, Ur Screen POSITIVE (*)    Benzodiazepine, Ur Scrn POSITIVE (*)    All other components within normal limits  ETHANOL  SALICYLATE LEVEL  POC URINE PREG, ED  POCT PREGNANCY, URINE    Pertinent labs  results that were available during my care of the patient were reviewed by me and considered in my medical decision making (see chart for details). ____________________________________________  EKG  I personally interpreted any EKGs ordered by me or triage ____________________________________________  RADIOLOGY  Pertinent labs & imaging results that were available during my care of the patient were reviewed by me and considered in my medical decision making (see chart for details). If possible, patient and/or family made aware of any abnormal findings.  No results found. ____________________________________________    PROCEDURES  Procedure(s) performed: None  Procedures  Critical Care performed: None  ____________________________________________   INITIAL IMPRESSION / ASSESSMENT AND PLAN / ED COURSE  Pertinent labs & imaging results that were available during my care of the patient were reviewed by me and considered in my medical decision making (see chart for details).  Patient here essentially holding herself on assistance to harm herself if she does not get the living arrangement but she prefers.  Patient  comes under IVC for bizarre behavior, she has threatened to put things in her vagina but she is adamant that she has not actually done so and she does not want me to look.  She does like me in the alliance where that she has not actually placed anything in her vagina.  Given this and the fact that she refuses pelvic exam at this time, we will take her word  for it.  Patient does not have any medical complaints.  We will have psychiatry see her.  ----------------------------------------- 10:45 PM on 10/05/2017 -----------------------------------------  Signed out at the end of my shift    ____________________________________________   FINAL CLINICAL IMPRESSION(S) / ED DIAGNOSES  Final diagnoses:  None      This chart was dictated using voice recognition software.  Despite best efforts to proofread,  errors can occur which can change meaning.      Jeanmarie Plant, MD 10/05/17 2004    Jeanmarie Plant, MD 10/05/17 2245

## 2017-10-06 MED ORDER — MAGNESIUM HYDROXIDE 400 MG/5ML PO SUSP
30.0000 mL | Freq: Every evening | ORAL | Status: DC | PRN
Start: 2017-10-06 — End: 2017-10-07
  Filled 2017-10-06: qty 30

## 2017-10-06 MED ORDER — QUETIAPINE FUMARATE ER 300 MG PO TB24
300.0000 mg | ORAL_TABLET | Freq: Every day | ORAL | Status: DC
  Administered 2017-10-06: 300 mg via ORAL
  Filled 2017-10-06 (×2): qty 1

## 2017-10-06 MED ORDER — ACETAMINOPHEN 325 MG PO TABS
650.0000 mg | ORAL_TABLET | Freq: Four times a day (QID) | ORAL | Status: DC | PRN
  Administered 2017-10-06: 650 mg via ORAL
  Filled 2017-10-06: qty 2

## 2017-10-06 MED ORDER — LISINOPRIL 20 MG PO TABS
20.0000 mg | ORAL_TABLET | Freq: Every day | ORAL | Status: DC
  Administered 2017-10-06 – 2017-10-07 (×2): 20 mg via ORAL
  Filled 2017-10-06: qty 2
  Filled 2017-10-06: qty 1

## 2017-10-06 MED ORDER — LITHIUM CARBONATE 300 MG PO CAPS
600.0000 mg | ORAL_CAPSULE | Freq: Every day | ORAL | Status: DC
  Administered 2017-10-06: 600 mg via ORAL
  Filled 2017-10-06 (×2): qty 2

## 2017-10-06 MED ORDER — CLONAZEPAM 1 MG PO TABS
1.0000 mg | ORAL_TABLET | Freq: Two times a day (BID) | ORAL | Status: DC
  Administered 2017-10-06 – 2017-10-07 (×3): 1 mg via ORAL
  Filled 2017-10-06: qty 2
  Filled 2017-10-06: qty 1
  Filled 2017-10-06: qty 2

## 2017-10-06 MED ORDER — HYDROCODONE-ACETAMINOPHEN 5-325 MG PO TABS: 1.0000 | ORAL_TABLET | ORAL | Status: DC | PRN

## 2017-10-06 MED ORDER — ASENAPINE MALEATE 5 MG SL SUBL
10.0000 mg | SUBLINGUAL_TABLET | Freq: Two times a day (BID) | SUBLINGUAL | Status: DC
  Administered 2017-10-06 (×2): 10 mg via SUBLINGUAL
  Filled 2017-10-06 (×3): qty 2

## 2017-10-06 MED ORDER — METFORMIN HCL 500 MG PO TABS
500.0000 mg | ORAL_TABLET | Freq: Every day | ORAL | Status: DC
  Administered 2017-10-06 – 2017-10-07 (×2): 500 mg via ORAL
  Filled 2017-10-06 (×2): qty 1

## 2017-10-06 MED ORDER — VITAMIN D 1000 UNITS PO TABS
1000.0000 [IU] | ORAL_TABLET | Freq: Every day | ORAL | Status: DC
  Administered 2017-10-06 – 2017-10-07 (×2): 1000 [IU] via ORAL
  Filled 2017-10-06 (×2): qty 1

## 2017-10-06 MED ORDER — CARBAMAZEPINE 200 MG PO TABS
400.0000 mg | ORAL_TABLET | Freq: Two times a day (BID) | ORAL | Status: DC
  Administered 2017-10-06 – 2017-10-07 (×3): 400 mg via ORAL
  Filled 2017-10-06 (×3): qty 2

## 2017-10-06 MED ORDER — FLUTICASONE PROPIONATE 50 MCG/ACT NA SUSP
2.0000 | Freq: Every day | NASAL | Status: DC | PRN
Start: 2017-10-06 — End: 2017-10-07
  Filled 2017-10-06: qty 16

## 2017-10-06 MED ORDER — ATORVASTATIN CALCIUM 10 MG PO TABS
10.0000 mg | ORAL_TABLET | Freq: Every day | ORAL | Status: DC
  Administered 2017-10-06: 10 mg via ORAL
  Filled 2017-10-06: qty 1

## 2017-10-06 MED ORDER — PROPRANOLOL HCL 10 MG PO TABS
40.0000 mg | ORAL_TABLET | Freq: Two times a day (BID) | ORAL | Status: DC
  Administered 2017-10-06 – 2017-10-07 (×3): 40 mg via ORAL
  Filled 2017-10-06: qty 4
  Filled 2017-10-06 (×2): qty 2

## 2017-10-06 MED ORDER — IBUPROFEN 600 MG PO TABS: 600.0000 mg | ORAL_TABLET | Freq: Once | ORAL | Status: DC

## 2017-10-06 MED ORDER — LEVOTHYROXINE SODIUM 75 MCG PO TABS
175.0000 ug | ORAL_TABLET | Freq: Every day | ORAL | Status: DC
  Administered 2017-10-06 – 2017-10-07 (×2): 175 ug via ORAL
  Filled 2017-10-06: qty 1
  Filled 2017-10-06: qty 4
  Filled 2017-10-06: qty 1

## 2017-10-06 MED ORDER — SERTRALINE HCL 100 MG PO TABS
100.0000 mg | ORAL_TABLET | Freq: Every day | ORAL | Status: DC
  Administered 2017-10-06 – 2017-10-07 (×2): 100 mg via ORAL
  Filled 2017-10-06: qty 1
  Filled 2017-10-06: qty 2

## 2017-10-06 MED ORDER — LORAZEPAM 1 MG PO TABS
1.0000 mg | ORAL_TABLET | Freq: Every day | ORAL | Status: DC | PRN
Start: 2017-10-06 — End: 2017-10-07

## 2017-10-06 NOTE — BH Assessment (Signed)
Pt referral information for psychiatric admission faxed to:  [facility]    [address] [place] [address] Phone: [phone] Fax: [phone] [facility]   [address] [place] [address] Phone: [phone] Fax: [phone] [facility]    [address] [place] [address] Phone: [phone] Fax: [phone] [facility]    [address] [place] [address] Phone: [phone] Fax: [phone] [facility] [address] [place] [address] Phone: [phone] Fax: [phone] [facility] [address] [place], [place] [address] Phone: [phone] Fax: [phone]

## 2017-10-06 NOTE — ED Notes (Signed)
ivc 

## 2017-10-06 NOTE — ED Notes (Signed)
Patient resting quietly in room. No noted distress or abnormal behaviors noted. Will continue 15 minute checks and observation by security camera for safety. 

## 2017-10-06 NOTE — ED Notes (Signed)
Pt. Up using bathroom, pt. Returned to bed with steady gait. 

## 2017-10-06 NOTE — ED Notes (Signed)
Pt stated she would like to return to her group home. "I could use my big cup and eat whatever I wanted." Maintained on 15 minute checks and observation by security camera for safety.

## 2017-10-06 NOTE — ED Provider Notes (Signed)
-----------------------------------------   7:07 AM on 10/06/2017 -----------------------------------------   Blood pressure 120/67, pulse 83, height 1.575 m (5\' 2" ), weight 126.1 kg (278 lb).  The patient had no acute events since last update.  Calm and cooperative at this time.  Disposition is pending Psychiatry/Behavioral Medicine team recommendations.     Loleta RoseForbach, Lauralie Blacksher, MD 10/06/17 [phone]

## 2017-10-06 NOTE — ED Notes (Signed)
Pt. Resting in 20 hallway bed, pt. Is calm and cooperative at this time.

## 2017-10-06 NOTE — ED Notes (Signed)
Voluntary 
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
## 2017-10-07 NOTE — ED Notes (Signed)
Pt discharged back to [facility]. Discharge paperwork reviewed with group home staff member. VS stable. Morning medications administered. All belongings returned to patient. Pt denies SI.

## 2017-10-07 NOTE — ED Notes (Signed)
Patient is speaking with [facility]. MD  Dr. Clabe SealPurnell.

## 2017-10-07 NOTE — ED Provider Notes (Signed)
-----------------------------------------   4:48 AM on 10/07/2017 -----------------------------------------   Blood pressure (!) 148/77, pulse 79, height 5\' 2"  (1.575 m), weight 126.1 kg (278 lb), SpO2 100 %.  The patient had no acute events since last update.  Calm and cooperative at this time.  We are repeating a telemetry psychiatry consultation is patient now states she wishes to go back to her group home.     Minna AntisPaduchowski, Jaire Pinkham, MD 10/07/17 [phone]

## 2017-10-07 NOTE — ED Notes (Signed)
RN spoke with patient's guardian, Cristela BlueRenee Beil (mother).  Guardian accepting of discharging patient back to group home.

## 2017-10-07 NOTE — ED Notes (Signed)
ED BHU PLACEMENT JUSTIFICATION Is the patient under IVC or is there intent for IVC: Yes.   Is the patient medically cleared: Yes.   Is there vacancy in the ED BHU: Yes.   Is the population mix appropriate for patient: Yes.   Is the patient awaiting placement in inpatient or outpatient setting: Yes.   Has the patient had a psychiatric consult: Yes.   Survey of unit performed for contraband, proper placement and condition of furniture, tampering with fixtures in bathroom, shower, and each patient room: Yes.   APPEARANCE/BEHAVIOR cooperative and adequate rapport can be established NEURO ASSESSMENT Orientation: time, place and person Hallucinations: Yes.  Auditory Hallucinations Speech: Normal Gait: normal RESPIRATORY ASSESSMENT Normal expansion.  Clear to auscultation.  No rales, rhonchi, or wheezing. CARDIOVASCULAR ASSESSMENT regular rate and rhythm, S1, S2 normal, no murmur, click, rub or gallop GASTROINTESTINAL ASSESSMENT soft, nontender, BS WNL, no r/g EXTREMITIES normal strength, tone, and muscle mass PLAN OF CARE Provide calm/safe environment. Vital signs assessed twice daily. ED BHU Assessment once each 12-hour shift. Collaborate with intake RN daily or as condition indicates. Assure the ED provider has rounded once each shift. Provide and encourage hygiene. Provide redirection as needed. Assess for escalating behavior; address immediately and inform ED provider.  Assess family dynamic and appropriateness for visitation as needed: Yes.   Educate the patient/family about BHU procedures/visitation: Yes.

## 2017-10-07 NOTE — Discharge Instructions (Addendum)
You have been seen in the emergency department for a  psychiatric concern. You have been evaluated both medically as well as psychiatrically. Please follow-up with your outpatient resources provided. Return to the emergency department for any worsening symptoms, or any thoughts of hurting yourself or anyone else so that we may attempt to help you. 

## 2017-10-07 NOTE — ED Notes (Signed)
[facility] called stated they would be arriving shortly to pick up patient.

## 2017-10-12 ENCOUNTER — Other Ambulatory Visit: Payer: Self-pay

## 2017-10-12 ENCOUNTER — Emergency Department
Admission: EM | Admit: 2017-10-12 | Discharge: 2017-10-12 | Disposition: A | Discharge status: Home / Self Care | Payer: Medicaid Other | Attending: Student in an Organized Health Care Education/Training Program | Admitting: Student in an Organized Health Care Education/Training Program

## 2017-10-12 ENCOUNTER — Encounter: Payer: Self-pay | Admitting: Emergency Medicine

## 2017-10-12 DIAGNOSIS — F329 Major depressive disorder, single episode, unspecified: Secondary | ICD-10-CM | POA: Diagnosis not present

## 2017-10-12 DIAGNOSIS — F4325 Adjustment disorder with mixed disturbance of emotions and conduct: Secondary | ICD-10-CM

## 2017-10-12 DIAGNOSIS — R45851 Suicidal ideations: Secondary | ICD-10-CM | POA: Diagnosis not present

## 2017-10-12 DIAGNOSIS — E039 Hypothyroidism, unspecified: Secondary | ICD-10-CM | POA: Diagnosis not present

## 2017-10-12 DIAGNOSIS — Z008 Encounter for other general examination: Secondary | ICD-10-CM | POA: Insufficient documentation

## 2017-10-12 DIAGNOSIS — E119 Type 2 diabetes mellitus without complications: Secondary | ICD-10-CM | POA: Diagnosis not present

## 2017-10-12 DIAGNOSIS — I10 Essential (primary) hypertension: Secondary | ICD-10-CM | POA: Insufficient documentation

## 2017-10-12 DIAGNOSIS — R454 Irritability and anger: Secondary | ICD-10-CM | POA: Diagnosis present

## 2017-10-12 DIAGNOSIS — F79 Unspecified intellectual disabilities: Secondary | ICD-10-CM

## 2017-10-12 LAB — CBC
HCT: 36.5 % (ref 35.0–47.0)
Hemoglobin: 12.2 g/dL (ref 12.0–16.0)
MCH: 28.8 pg (ref 26.0–34.0)
MCHC: 33.3 g/dL (ref 32.0–36.0)
MCV: 86.4 fL (ref 80.0–100.0)
Platelets: 257 10*3/uL (ref 150–440)
RBC: 4.23 MIL/uL (ref 3.80–5.20)
RDW: 14.3 % (ref 11.5–14.5)
WBC: 10.5 10*3/uL (ref 3.6–11.0)

## 2017-10-12 LAB — COMPREHENSIVE METABOLIC PANEL
ALT: 23 U/L (ref 14–54)
AST: 25 U/L (ref 15–41)
Albumin: 3.8 g/dL (ref 3.5–5.0)
Alkaline Phosphatase: 77 U/L (ref 38–126)
Anion gap: 8 (ref 5–15)
BUN: 9 mg/dL (ref 6–20)
CO2: 24 mmol/L (ref 22–32)
Calcium: 9 mg/dL (ref 8.9–10.3)
Chloride: 107 mmol/L (ref 101–111)
Creatinine, Ser: 0.77 mg/dL (ref 0.44–1.00)
GFR calc Af Amer: >60 mL/min (ref >60)
GFR calc non Af Amer: >60 mL/min (ref >60)
Glucose, Bld: 140 mg/dL — ABNORMAL HIGH (ref 65–99)
Potassium: 3.7 mmol/L (ref 3.5–5.1)
Sodium: 139 mmol/L (ref 135–145)
Total Bilirubin: 0.3 mg/dL (ref 0.3–1.2)
Total Protein: 7.6 g/dL (ref 6.5–8.1)

## 2017-10-12 LAB — ACETAMINOPHEN LEVEL: Acetaminophen (Tylenol), Serum: <10 ug/mL — ABNORMAL LOW (ref 10–30)

## 2017-10-12 LAB — SALICYLATE LEVEL: Salicylate Lvl: <7 mg/dL (ref 2.8–30.0)

## 2017-10-12 LAB — ETHANOL: Alcohol, Ethyl (B): <10 mg/dL (ref <10)

## 2017-10-12 NOTE — ED Notes (Signed)
Group home rep "Mac" arrived to transport pt back to group home, pt calm cooperative, no aggression noted, no distress.

## 2017-10-12 NOTE — ED Notes (Signed)
Pt refused VS. Pt stated she will not return to the group home. Maintained on 15 minute checks and observation by security camera for safety.

## 2017-10-12 NOTE — Consult Note (Signed)
[facility] Consult   Reason for Consult: Consult for 27 year old woman with a history of intellectual disability Referring Physician: Quentin Cornwall Patient Identification: Dawn Lambert MRN:  [id number] Principal Diagnosis: Adjustment disorder with mixed disturbance of emotions and conduct Diagnosis:   Patient Active Problem List   Diagnosis Date Noted  . Adjustment disorder with mixed disturbance of emotions and conduct [F43.25] 10/12/2017  . Intellectual disability [F79] 10/12/2017  . Umbilical hernia without obstruction and without gangrene [K42.9] 08/15/2017  . Chest pain [R07.9] 11/05/2012  . Atrial septal defect [Q21.1] 09/19/2011    Total Time spent with patient: 1 hour  Subjective:   Dawn Lambert is a 27 y.o. female patient admitted with "I had a disagreement at my group home".  HPI: Patient interviewed.  Chart reviewed.  27 year old woman with intellectual disability.  She tells me that she had a disagreement at her group home.  Normally she likes to go around to local restaurants in businesses and collect coupons from them and then distribute them to other living facilities.  Apparently her group home told her that she was not allowed to do this for the next 60 days as part of some behavioral plan.  Patient admits that she had been aware of this.  She is very frustrated about it however and wants me to write a note telling the group home that they are not to do that.  Patient denies being depressed.  Denies suicidal or homicidal thoughts.  Denies psychotic symptoms.  Denies any recent changes to any of her medical issues.  When we came to talking about how she did not need hospitalizations the patient said that if we did send her back to her group home that she would either insert something into her rectum or cut herself on the chest.  I asked if she had ever done such things in the past and she admitted that she had not.  Patient has no wish to actually die or to  hurt her self.  As she herself says "I am famous for getting my way"  Medical history: Obese.  Intellectual disability.  Multiple other congenital problems.  Substance abuse history: No alcohol or drug abuse  Social history: Has been living in the same group home for many years.  Admits that she gets along fine with them most of the time.   Past Psychiatric History: No previous psychiatric hospitalization.  No previous suicide attempts no history of violence.  Patient clearly has intellectual disability.  She says that she has been manipulative in the past to try to get her way and that that is what she is doing this time as well.  Risk to Self: Suicidal Ideation: Yes-Currently Present Suicidal Intent: No Is patient at risk for suicide?: No Suicidal Plan?: No Access to Means: No Specify Access to Suicidal Means: None reported What has been your use of drugs/alcohol within the last 12 months?: None reported How many times?: 0 Other Self Harm Risks: None reported Triggers for Past Attempts: Other (Comment) Intentional Self Injurious Behavior: None Comment - Self Injurious Behavior: None reported Risk to Others: Homicidal Ideation: No Thoughts of Harm to Others: No Current Homicidal Intent: No Current Homicidal Plan: No Access to Homicidal Means: No Identified Victim: None reported History of harm to others?: No Assessment of Violence: None Noted Violent Behavior Description: None reported Does patient have access to weapons?: No Criminal Charges Pending?: No Does patient have a court date: No Prior Inpatient Therapy: Prior Inpatient Therapy:  Yes Prior Therapy Dates: Various Prior Therapy [facility]Provider(s): [facility] Reason for Treatment: Depression Prior Outpatient Therapy: Prior Outpatient Therapy: Yes Prior Therapy Dates: current Prior Therapy Facilty/Provider(s): [facility] Reason for Treatment: Depression Does patient have an ACCT team?: No Does  patient have Intensive In-House Services?  : No Does patient have [facility] services? : No Does patient have P4CC services?: No  Past Medical History:  Past Medical History:  Diagnosis Date  . Anxiety   . Arnold-Chiari malformation ([place])   . Atrial septal defect   . Complication of anesthesia    when pt was 11, woke up during surgery (muscle biopsy)  . Constipation   . Contraception management   . Dandruff   . 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 Diabetes mellitus without complication ([place])   . Elevated blood pressure   . Hypertension   . Hypothyroidism   . Mental retardation   . Mood disorder ([place])   . Overweight(278.02)   . Pelvic pain   . Prediabetes     Past Surgical History:  Procedure Laterality Date  . ASD REPAIR     at age 72  . UMBILICAL HERNIA REPAIR N/A 09/17/2017   Procedure: HERNIA REPAIR UMBILICAL ADULT;  Surgeon: Robert Bellow, MD;  Location: [facility] ORS;  Service: General;  Laterality: N/A;   Family History: No family history on file. Family Psychiatric  History: Unknown Social History:  Social History   Substance and Sexual Activity  Alcohol Use No     Social History   Substance and Sexual Activity  Drug Use No    Social History   Socioeconomic History  . Marital status: Single    Spouse name: Not on file  . Number of children: Not on file  . Years of education: Not on file  . Highest education level: Not on file  Occupational History  . Not on file  Social Needs  . Financial resource strain: Not on file  . Food insecurity:    Worry: Not on file    Inability: Not on file  . Transportation needs:    Medical: Not on file    Non-medical: Not on file  Tobacco Use  . Smoking status: Never Smoker  . Smokeless tobacco: Never Used  Substance and Sexual Activity  . Alcohol use: No  . Drug use: No  . Sexual activity: Not on file  Lifestyle  . Physical activity:    Days per week: Not on file    Minutes per session: Not on file  . Stress:  Not on file  Relationships  . Social connections:    Talks on phone: Not on file    Gets together: Not on file    Attends religious service: Not on file    Active member of club or organization: Not on file    Attends meetings of clubs or organizations: Not on file    Relationship status: Not on file  Other Topics Concern  . Not on file  Social History Narrative  . Not on file   Additional Social History:    Allergies:   Allergies  Allergen Reactions  . Augmentin [Amoxicillin-Pot Clavulanate] Other (See Comments)    Has patient had a PCN reaction causing immediate rash, facial/tongue/throat swelling, SOB or lightheadedness with hypotension: ______ Has patient had a PCN reaction causing severe rash involving mucus membranes or skin necrosis: _______ Has patient had a PCN reaction that required hospitalization: _______ Has patient had a PCN  reaction occurring within the last 10 years:_______ If all of the above answers are "NO", then may proceed with Cephalosporin use.     Labs:  Results for orders placed or performed during the hospital encounter of 10/12/17 (from the past 48 hour(s))  Comprehensive metabolic panel     Status: Abnormal   Collection Time: 10/12/17  4:18 PM  Result Value Ref Range   Sodium 139 135 - 145 mmol/L   Potassium 3.7 3.5 - 5.1 mmol/L   Chloride 107 101 - 111 mmol/L   CO2 24 22 - 32 mmol/L   Glucose, Bld 140 (H) 65 - 99 mg/dL   BUN 9 6 - 20 mg/dL   Creatinine, Ser 0.77 0.44 - 1.00 mg/dL   Calcium 9.0 8.9 - 10.3 mg/dL   Total Protein 7.6 6.5 - 8.1 g/dL   Albumin 3.8 3.5 - 5.0 g/dL   AST 25 15 - 41 U/L   ALT 23 14 - 54 U/L   Alkaline Phosphatase 77 38 - 126 U/L   Total Bilirubin 0.3 0.3 - 1.2 mg/dL   GFR calc non Af Amer >60 >60 mL/min   GFR calc Af Amer >60 >60 mL/min    Comment: (NOTE) The eGFR has been calculated using the CKD EPI equation. This calculation has not been validated in all clinical situations. eGFR's persistently <60 mL/min  signify possible Chronic Kidney Disease.    Anion gap 8 5 - 15    Comment: Performed at [facility], [address] [place], [place] [address]  Ethanol     Status: None   Collection Time: 10/12/17  4:18 PM  Result Value Ref Range   Alcohol, Ethyl (B) <10 <10 mg/dL    Comment: (NOTE) Lowest detectable limit for serum alcohol is 10 mg/dL. For medical purposes only. Performed at [facility], [address] [place], [place] [address]   Salicylate level     Status: None   Collection Time: 10/12/17  4:18 PM  Result Value Ref Range   Salicylate Lvl <8.8 2.8 - 30.0 mg/dL    Comment: Performed at [facility], [address] [place], [place] [address]  Acetaminophen level     Status: Abnormal   Collection Time: 10/12/17  4:18 PM  Result Value Ref Range   Acetaminophen (Tylenol), Serum <10 (L) 10 - 30 ug/mL    Comment: (NOTE) Therapeutic concentrations vary significantly. A range of 10-30 ug/mL  may be an effective concentration for many patients. However, some  are best treated at concentrations outside of this range. Acetaminophen concentrations >150 ug/mL at 4 hours after ingestion  and >50 ug/mL at 12 hours after ingestion are often associated with  toxic reactions. Performed at [facility], [address] [place], [place] [address]   cbc     Status: None   Collection Time: 10/12/17  4:18 PM  Result Value Ref Range   WBC 10.5 3.6 - 11.0 K/uL   RBC 4.23 3.80 - 5.20 MIL/uL   Hemoglobin 12.2 12.0 - 16.0 g/dL   HCT 36.5 35.0 - 47.0 %   MCV 86.4 80.0 - 100.0 fL   MCH 28.8 26.0 - 34.0 pg   MCHC 33.3 32.0 - 36.0 g/dL   RDW 14.3 11.5 - 14.5 %   Platelets 257 150 - 440 K/uL    Comment: Performed at [facility], [address] [place], [place] [address]    No current facility-administered medications for this encounter.    Current  Outpatient Medications  Medication Sig Dispense Refill  . acetaminophen (TYLENOL) 325 MG  tablet Take 650 mg by mouth every 6 (six) hours as needed for moderate pain.     . Asenapine Maleate (SAPHRIS) 10 MG SUBL Place 10 mg under the tongue 2 (two) times daily.    [name] atorvastatin (LIPITOR) 10 MG tablet Take 10 mg by mouth daily at 6 PM.     . carbamazepine (TEGRETOL) 200 MG tablet Take 400 mg by mouth 2 (two) times daily.    . cholecalciferol (VITAMIN D) 1000 units tablet Take 1,000 Units by mouth daily.     . clonazePAM (KLONOPIN) 1 MG tablet Take 1 mg by mouth 2 (two) times daily.    . clotrimazole (LOTRIMIN) 1 % cream Apply 1 application topically 2 (two) times daily.    . fluconazole (DIFLUCAN) 150 MG tablet Take 150 mg by mouth See admin instructions. Take 123m by mouth once, take 1586magain if yeast symptoms return after taking prescribed clindamycin antibiotic    . Fluocinolone Acetonide Scalp (DERMA-SMOOTHE/FS SCALP) 0.01 % OIL Apply 1-5 application topically See admin instructions. Apply to scalp every week for psoriasis but may increase up to 5 days per week as needed.    . fluticasone (FLONASE) 50 MCG/ACT nasal spray Place 2 sprays into the nose daily as needed for allergies.     . Marland Kitchenabapentin (NEURONTIN) 600 MG tablet Take 600 mg by mouth 3 (three) times daily.    . Marland KitchenYDROcodone-acetaminophen (NORCO/VICODIN) 5-325 MG tablet Take 1 tablet by mouth every 4 (four) hours as needed for moderate pain. 15 tablet 0  . levothyroxine (SYNTHROID, LEVOTHROID) 175 MCG tablet Take 175 mcg by mouth daily before breakfast.    . lisinopril (PRINIVIL,ZESTRIL) 20 MG tablet Take 20 mg by mouth daily.     . Marland Kitchenithium carbonate 300 MG capsule Take 600 mg by mouth at bedtime.     . Marland KitchenORazepam (ATIVAN) 1 MG tablet Take 1 mg by mouth daily as needed for anxiety.     . magnesium hydroxide (MILK OF MAGNESIA) 400 MG/5ML suspension Take 30 mLs by mouth at bedtime as needed for moderate constipation.     . medroxyPROGESTERone (DEPO-PROVERA) 150 MG/ML injection Inject 150 mg into the muscle See admin  instructions. "Every 12 weeks"    . metFORMIN (GLUCOPHAGE) 500 MG tablet Take 500 mg by mouth daily.     . mometasone (ELOCON) 0.1 % cream Apply 1 application topically daily as needed (Itching of the ears).     . Multiple Vitamin (THEREMS PO) Take 1 tablet by mouth daily.    . Paliperidone Palmitate ER (INVEGA TRINZA) 819 MG/2.625ML SUSY Inject 819 mg into the muscle every 3 (three) months.    . propranolol (INDERAL) 40 MG tablet Take 40 mg by mouth 2 (two) times daily.    . QUEtiapine (SEROQUEL XR) 300 MG 24 hr tablet Take 300 mg by mouth at bedtime.    . sertraline (ZOLOFT) 100 MG tablet Take 100 mg by mouth daily.       Musculoskeletal: Strength & Muscle Tone: within normal limits Gait & Station: normal Patient leans: N/A  Psychiatric Specialty Exam: Physical Exam  Nursing note and vitals reviewed. Constitutional: She appears well-developed and well-nourished.  HENT:  Head: Normocephalic and atraumatic.  Eyes: Pupils are equal, round, and reactive to light. Conjunctivae are normal.  Neck: Normal range of motion.  Cardiovascular: Normal heart sounds.  Respiratory: Effort normal. No respiratory distress.  GI: Soft.  Musculoskeletal: Normal  range of motion.  Neurological: She is alert.  Skin: Skin is warm and dry.  Psychiatric: Her affect is blunt. Her speech is delayed. She is slowed. Thought content is not paranoid. Cognition and memory are impaired. She expresses impulsivity. She expresses no homicidal and no suicidal ideation.    Review of Systems  Constitutional: Negative.   HENT: Negative.   Eyes: Negative.   Respiratory: Negative.   Cardiovascular: Negative.   Gastrointestinal: Negative.   Musculoskeletal: Negative.   Skin: Negative.   Neurological: Negative.   Psychiatric/Behavioral: Negative.     Blood pressure (!) 152/87, pulse 76, temperature 99.4 F (37.4 C), temperature source Oral, resp. rate 16, weight 126.1 kg (278 lb), SpO2 98 %.Body mass index is 50.85  kg/m.  General Appearance: Casual  Eye Contact:  Fair  Speech:  Clear and Coherent  Volume:  Decreased  Mood:  Euthymic  Affect:  Constricted  Thought Process:  Goal Directed  Orientation:  Full (Time, Place, and Person)  Thought Content:  Logical  Suicidal Thoughts:  No  Homicidal Thoughts:  No  Memory:  Immediate;   Fair Recent;   Fair Remote;   Fair  Judgement:  Impaired  Insight:  Shallow  Psychomotor Activity:  Decreased  Concentration:  Concentration: Fair  Recall:  [employer] of Knowledge:  Fair  Language:  Fair  Akathisia:  No  Handed:  Right  AIMS (if indicated):     Assets:  Desire for Improvement Housing Resilience  ADL's:  Intact  Cognition:  Impaired,  Mild  Sleep:        Treatment Plan Summary: Plan The patient herself makes it clear that all of her threats and behaviors are attempts to manipulate the group home into letting her get her way.  She has no symptoms of major depression.  She is not psychotic.  She has no wish to die.  Patient does not meet commitment criteria and does not require inpatient hospitalization.  I strongly encourage the patient not to follow through on any self-harm behavior as it is not going to get her what she wants and is just going to make her feel worse.  She understands this.  Case reviewed with emergency room physician and TTS.  Patient can be discharged back to her group home no new prescriptions.  Disposition: No evidence of imminent risk to self or others at present.   Patient does not meet criteria for psychiatric inpatient admission. Supportive therapy provided about ongoing stressors.  Alethia Berthold, MD 10/12/2017 6:13 PM

## 2017-10-12 NOTE — ED Triage Notes (Signed)
Pt to ED via [facility] from group home. Pt is currently voluntary at this time. Pt states that "if I have to go back to my group home, I will hurt myself, and I have a plan of how I would do it". Pt states that "if they send me back to [facility] that I am going to insert things into my bottom and if that does not kill me then I am going to cut myself with a knife or something sharp". Pt contracts for safety in triage, stating that she will not harm herself or others while she is here and that "the only way I will hurt myself is if I go back to [address]  Pt is calm and cooperative in triage.

## 2017-10-12 NOTE — BH Assessment (Addendum)
Assessment Note  Dawn Lambert is an 27 y.o. female. 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 Patient reports she really doesn't want to harm herself she just said it because she was upset with group home staff. Patient reports previous inpatient hospitalizatios at [facility] and [facility] due to to depression. Patient denies any previous suicide attempts. Patient denies HI, AVH. Patient states she receives medication management at [facility] with Dr. Romeo Apple. Patient denies any illicit drug and alcohol use.   Patient doesn't currently have any involvement in the legal system.    Patient reports he legal guardian is her mother Dawn Leriche [phone])  Patient presented oriented x 4, cooperative with a pleasant affect during assessment.  Per group home staff member Dawn Lambert at [facility] in [place], [name] states patient became angry at the doctor of the group home because her phone privileges were taken away and wandered off, then the police were call. Dawn Lambert stated patient acts out if she doesn't get way.    Diagnosis: Depression  Past Medical History:  Past Medical History:  Diagnosis Date  . Anxiety   . Arnold-Chiari malformation (HCC)   . Atrial septal defect   . Complication of anesthesia    when pt was 11, woke up during surgery (muscle biopsy)  . Constipation   . Contraception management   . Dandruff   . Dermatomyositis (HCC)    age 31  . Diabetes mellitus without complication (HCC)   . Elevated blood pressure   . Hypertension   . Hypothyroidism   . Mental retardation   . Mood disorder (HCC)   . Overweight(278.02)   . Pelvic pain   . Prediabetes     Past Surgical History:  Procedure Laterality Date  . ASD REPAIR     at age 33  . UMBILICAL HERNIA REPAIR N/A  09/17/2017   Procedure: HERNIA REPAIR UMBILICAL ADULT;  Surgeon: Earline Mayotte, MD;  Location: [facility] ORS;  Service: General;  Laterality: N/A;    Family History: No family history on file.  Social History:  reports that she has never smoked. She has never used smokeless tobacco. She reports that she does not drink alcohol or use drugs.  Additional Social History:  Alcohol / Drug Use Pain Medications: SEE PTA  Prescriptions: SEE PTA  Over the Counter: SEE PTA  History of alcohol / drug use?: No history of alcohol / drug abuse Longest period of sobriety (when/how long): None reported  CIWA: CIWA-Ar BP: (!) 152/87 Pulse Rate: 76 COWS:    Allergies:  Allergies  Allergen Reactions  . Augmentin [Amoxicillin-Pot Clavulanate] Other (See Comments)    Has patient had a PCN reaction causing immediate rash, facial/tongue/throat swelling, SOB or lightheadedness with hypotension: ______ Has patient had a PCN reaction causing severe rash involving mucus membranes or skin necrosis: _______ Has patient had a PCN reaction that required hospitalization: _______ Has patient had a PCN reaction occurring within the last 10 years:_______ If all of the above answers are "NO", then may proceed with Cephalosporin use.     Home Medications:  (Not in a hospital admission)  OB/GYN Status:  No LMP recorded. Patient has had an injection.  General Assessment Data Assessment unable to be completed: (Assessment completed) Location of Assessment: [facility]  Assessment: In system Is this a Tele or Face-to-Face Assessment?: Face-to-Face Is this an Initial Assessment or a Re-assessment for this encounter?: Initial Assessment Marital status: Single Maiden name: N/A Is patient pregnant?: No Pregnancy Status: No Living Arrangements: Group Home([facility]) Can pt return to current living arrangement?: Yes Admission Status: Voluntary Is patient capable of signing voluntary admission?: Yes Referral  Source: Self/Family/Friend Insurance type: Medicaid  Medical Screening Exam [facility] ONLY) Medical Exam completed: Yes  Crisis Care Plan Living Arrangements: Group Home([facility]) Legal Guardian: Mother(Dawn [name] [phone])) Name of Psychiatrist: [facility]. Romeo Lambert ([facility] Name of Therapist: None reported  Education Status Is patient currently in school?: No Current Grade: N/A Highest grade of school patient has completed: 9th grade Name of school: N/A Contact person: N/A IEP information if applicable: N/A Is the patient employed, unemployed or receiving disability?: Receiving disability income  Risk to self with the past 6 months Suicidal Ideation: Yes-Currently Present Has patient been a risk to self within the past 6 months prior to admission? : Yes Suicidal Intent: No Has patient had any suicidal intent within the past 6 months prior to admission? : No Is patient at risk for suicide?: No Suicidal Plan?: No Has patient had any suicidal plan within the past 6 months prior to admission? : No Access to Means: No Specify Access to Suicidal Means: None reported What has been your use of drugs/alcohol within the last 12 months?: None reported Previous Attempts/Gestures: No How many times?: 0 Other Self Harm Risks: None reported Triggers for Past Attempts: Other (Comment) Intentional Self Injurious Behavior: None Comment - Self Injurious Behavior: None reported Family Suicide History: No Recent stressful life event(s): Conflict (Comment)(conflict with group home staff) Persecutory voices/beliefs?: No Depression: Yes Depression Symptoms: Feeling angry/irritable Substance abuse history and/or treatment for substance abuse?: No Suicide prevention information given to non-admitted patients: Not applicable  Risk to Others within the past 6 months Homicidal Ideation: No Does patient have any lifetime risk of violence toward others beyond the six months  prior to admission? : No Thoughts of Harm to Others: No Current Homicidal Intent: No Current Homicidal Plan: No Access to Homicidal Means: No Identified Victim: None reported History of harm to others?: No Assessment of Violence: None Noted Violent Behavior Description: None reported Does patient have access to weapons?: No Criminal Charges Pending?: No Does patient have a court date: No Is patient on probation?: No  Psychosis Hallucinations: None noted Delusions: None noted  Mental Status Report Appearance/Hygiene: Unremarkable Eye Contact: Good Motor Activity: Unremarkable Speech: Logical/coherent Level of Consciousness: Alert Mood: Sullen Affect: Sullen Anxiety Level: Minimal Thought Processes: Circumstantial Judgement: Impaired Orientation: Person, Place, Time, Situation, Appropriate for developmental age Obsessive Compulsive Thoughts/Behaviors: None  Cognitive Functioning Concentration: Normal Memory: Recent Intact, Remote Intact Is patient IDD: Yes Level of Function: Moderate Is patient DD?: Yes I IQ score available?: No Insight: Fair Impulse Control: Fair Appetite: Good Have you had any weight changes? : Loss Amount of the weight change? (lbs): 42 lbs Sleep: Increased Total Hours of Sleep: 10 Vegetative Symptoms: None  ADLScreening [facility]) Patient's cognitive ability adequate to safely complete daily activities?: Yes Patient able to express need for assistance with ADLs?: Yes Independently performs ADLs?: Yes (appropriate for developmental age)  Prior Inpatient Therapy Prior Inpatient Therapy: Yes Prior Therapy Dates: Various Prior Therapy [facility]ilty[facility]Provider[facility] Reason for Treatment: Depression  Prior Outpatient Therapy Prior Outpatient Therapy: Yes Prior Therapy Dates: current  Prior Therapy Facilty/Provider(s): [facility] Reason for Treatment: Depression Does patient have an ACCT team?: No Does  patient have Intensive In-House Services?  : No Does patient have [facility] services? : No Does patient have P4CC services?: No  ADL Screening (condition at time of admission) Patient's cognitive ability adequate to safely complete daily activities?: Yes Is the patient deaf or have difficulty hearing?: No Does the patient have difficulty seeing, even when wearing glasses/contacts?: No Does the patient have difficulty concentrating, remembering, or making decisions?: Yes Patient able to express need for assistance with ADLs?: Yes Does the patient have difficulty dressing or bathing?: No Independently performs ADLs?: Yes (appropriate for developmental age) Does the patient have difficulty walking or climbing stairs?: No Weakness of Legs: None  Home Assistive Devices/Equipment Home Assistive Devices/Equipment: None  Therapy Consults (therapy consults require a physician order) PT Evaluation Needed: No OT Evalulation Needed: No SLP Evaluation Needed: No Abuse/Neglect Assessment (Assessment to be complete while patient is alone) Abuse/Neglect Assessment Can Be Completed: Yes Physical Abuse: Denies Verbal Abuse: Yes, past (Comment) Sexual Abuse: Denies Exploitation of patient/patient's resources: Denies Self-Neglect: Denies Values / Beliefs Cultural Requests During Hospitalization: None Spiritual Requests During Hospitalization: None Consults Spiritual Care Consult Needed: No Social Work Consult Needed: No            Disposition:  Disposition Initial Assessment Completed for this Encounter: Yes Patient referred to: Other (Comment)(Outpatient Provider- [facility])  On Site Evaluation by:   Reviewed with Physician:    Galen Manila, LPC, LCAS-A 10/12/2017 5:38 PM

## 2017-10-12 NOTE — ED Notes (Signed)
Pt dressed out by this RN and Erskine SquibbJane, Charity fundraiserN. Pt belongings listed below  1 pair of black flip flops with pink straps, 1 pair of grey underwear 1 multicolor dress 1 black bra 4 rings- 1 gold with red stone and clear stones, 1 gold with purple stone, 1 gold with purple stone and clear stones, 1 light pink with clear stones 1 fitness watch with purple band.   All items placed in pt belongings bag and labeled with pt sticker.

## 2017-10-12 NOTE — ED Notes (Signed)
Pt aware of need for urine specimen at this time. Understands we need specimen.  Given second warm blanket.

## 2017-10-12 NOTE — ED Provider Notes (Signed)
[facility] Emergency Department Provider Note    First MD Initiated Contact with Patient 10/12/17 1630     (approximate)  I have reviewed the triage vital signs and the nursing notes.   HISTORY  Chief Complaint Psychiatric Evaluation    HPI Dawn Lambert is a 27 y.o. female with history of mood disorder and MR living in a group home presents to the ER stating she got upset with her group home today because she likes to go around with a "tax ID number asking for gifts and kind of ".  She was told by the group home director that she was no longer allowed to do so she got very upset saying that she wanted to hurt herself.  States that she has to go back to the group home she will hurt herself.  States that she would insert things in her bottom and if that does not more than she would use a knife or something sharp to cut her stomach.  States that she does not have any plan to hurt herself now as she is no longer at the group home.    Past Medical History:  Diagnosis Date  . Anxiety   . Arnold-Chiari malformation (HCC)   . Atrial septal defect   . Complication of anesthesia    when pt was 11, woke up during surgery (muscle biopsy)  . Constipation   . Contraception management   . Dandruff   . Dermatomyositis (HCC)    age 89  . Diabetes mellitus without complication (HCC)   . Elevated blood pressure   . Hypertension   . Hypothyroidism   . Mental retardation   . Mood disorder (HCC)   . Overweight(278.02)   . Pelvic pain   . Prediabetes    No family history on file. Past Surgical History:  Procedure Laterality Date  . ASD REPAIR     at age 89  . UMBILICAL HERNIA REPAIR N/A 09/17/2017   Procedure: HERNIA REPAIR UMBILICAL ADULT;  Surgeon: [name], [name], MD;  Location: [facility] ORS;  Service: General;  Laterality: N/A;   Patient Active Problem List   Diagnosis Date Noted  . Umbilical hernia without obstruction and without gangrene 08/15/2017  .  Chest pain 11/05/2012  . Atrial septal defect 09/19/2011      Prior to Admission medications   Medication Sig Start Date End Date Taking? Authorizing Provider  acetaminophen (TYLENOL) 325 MG tablet Take 650 mg by mouth every 6 (six) hours as needed for moderate pain.     [provider]  Asenapine Maleate (SAPHRIS) 10 MG SUBL Place 10 mg under the tongue 2 (two) times daily.    [provider]  atorvastatin (LIPITOR) 10 MG tablet Take 10 mg by mouth daily at 6 PM.     [provider]  carbamazepine (TEGRETOL) 200 MG tablet Take 400 mg by mouth 2 (two) times daily.    [provider]  cholecalciferol (VITAMIN D) 1000 units tablet Take 1,000 Units by mouth daily.     [provider]  clonazePAM (KLONOPIN) 1 MG tablet Take 1 mg by mouth 2 (two) times daily.    [provider]  clotrimazole (LOTRIMIN) 1 % cream Apply 1 application topically 2 (two) times daily.    [provider]  fluconazole (DIFLUCAN) 150 MG tablet Take 150 mg by mouth See admin instructions. Take 150mg  by mouth once, take 150mg  again if yeast symptoms return after taking prescribed clindamycin antibiotic  [provider]  Fluocinolone Acetonide Scalp (DERMA-SMOOTHE/FS SCALP) 0.01 % OIL Apply 1-5 application topically See admin instructions. Apply to scalp every week for psoriasis but may increase up to 5 days per week as needed.    [provider]  fluticasone (FLONASE) 50 MCG/ACT nasal spray Place 2 sprays into the nose daily as needed for allergies.     [provider]  gabapentin (NEURONTIN) 600 MG tablet Take 600 mg by mouth 3 (three) times daily.    [provider]  HYDROcodone-acetaminophen (NORCO/VICODIN) 5-325 MG tablet Take 1 tablet by mouth every 4 (four) hours as needed for moderate pain. 09/17/17 09/17/18  Earline Mayotte, MD  levothyroxine (SYNTHROID, LEVOTHROID) 175 MCG tablet Take 175 mcg by mouth daily before  breakfast.    [provider]  lisinopril (PRINIVIL,ZESTRIL) 20 MG tablet Take 20 mg by mouth daily.     [provider]  lithium carbonate 300 MG capsule Take 600 mg by mouth at bedtime.     [provider]  LORazepam (ATIVAN) 1 MG tablet Take 1 mg by mouth daily as needed for anxiety.     [provider]  magnesium hydroxide (MILK OF MAGNESIA) 400 MG/5ML suspension Take 30 mLs by mouth at bedtime as needed for moderate constipation.     [provider]  medroxyPROGESTERone (DEPO-PROVERA) 150 MG/ML injection Inject 150 mg into the muscle See admin instructions. "Every 12 weeks"    [provider]  metFORMIN (GLUCOPHAGE) 500 MG tablet Take 500 mg by mouth daily.     [provider]  mometasone (ELOCON) 0.1 % cream Apply 1 application topically daily as needed (Itching of the ears).     [provider]  Multiple Vitamin (THEREMS PO) Take 1 tablet by mouth daily.    [provider]  Paliperidone Palmitate ER (INVEGA TRINZA) 819 MG/2.625ML SUSY Inject 819 mg into the muscle every 3 (three) months.    [provider]  propranolol (INDERAL) 40 MG tablet Take 40 mg by mouth 2 (two) times daily.    [provider]  QUEtiapine (SEROQUEL XR) 300 MG 24 hr tablet Take 300 mg by mouth at bedtime.    [provider]  sertraline (ZOLOFT) 100 MG tablet Take 100 mg by mouth daily.     [provider]    Allergies Augmentin [amoxicillin-pot clavulanate]    Social History Social History   Tobacco Use  . Smoking status: Never Smoker  . Smokeless tobacco: Never Used  Substance Use Topics  . Alcohol use: No  . Drug use: No    Review of Systems Patient denies headaches, rhinorrhea, blurry vision, numbness, shortness of breath, chest pain, edema, cough, abdominal pain, nausea, vomiting, diarrhea, dysuria, fevers, rashes or hallucinations unless otherwise stated above in  HPI. ____________________________________________   PHYSICAL EXAM:  VITAL SIGNS: Vitals:   10/12/17 1604  BP: (!) 152/87  Pulse: 76  Resp: 16  Temp: 99.4 F (37.4 C)  SpO2: 98%    Constitutional: Alert and oriented.  Eyes: Conjunctivae are normal.  Head: Atraumatic. Nose: No congestion/rhinnorhea. Mouth/Throat: Mucous membranes are moist.   Neck: No stridor. Painless ROM.  Cardiovascular: Normal rate, regular rhythm. Grossly normal heart sounds.  Good peripheral circulation. Respiratory: Normal respiratory effort.  No retractions. Lungs CTAB. Gastrointestinal: Soft and nontender. No distention. No abdominal bruits. No CVA tenderness. Genitourinary:  Musculoskeletal: No lower extremity tenderness nor edema.  No joint effusions. Neurologic:  Normal speech and language. No gross focal neurologic  deficits are appreciated. No facial droop Skin:  Skin is warm, dry and intact. No rash noted. Psychiatric: MR, appropriate speech  ____________________________________________   LABS (all labs ordered are listed, but only abnormal results are displayed)  Results for orders placed or performed during the hospital encounter of 10/12/17 (from the past 24 hour(s))  cbc     Status: None   Collection Time: 10/12/17  4:18 PM  Result Value Ref Range   WBC 10.5 3.6 - 11.0 K/uL   RBC 4.23 3.80 - 5.20 MIL/uL   Hemoglobin 12.2 12.0 - 16.0 g/dL   HCT 81.1 91.4 - 78.2 %   MCV 86.4 80.0 - 100.0 fL   MCH 28.8 26.0 - 34.0 pg   MCHC 33.3 32.0 - 36.0 g/dL   RDW 95.6 21.3 - 08.6 %   Platelets 257 150 - 440 K/uL   ______________________________________________________________  RADIOLOGY   ____________________________________________   PROCEDURES  Procedure(s) performed:  Procedures    Critical Care performed: no ____________________________________________   INITIAL IMPRESSION / ASSESSMENT AND PLAN / ED COURSE  Pertinent labs & imaging results that were available during my care  of the patient were reviewed by me and considered in my medical decision making (see chart for details).   DDX: Psychosis, delirium, medication effect, noncompliance, polysubstance abuse, Si, Hi, depression   Khadijatou DEVANIE GALANTI is a 27 y.o. who presents to the ED with for evaluation of circumstantial SI.  Patient has psych history of MR and mood disorder.  Laboratory testing was ordered to evaluation for underlying electrolyte derangement or signs of underlying organic pathology to explain today's presentation.  Based on history and physical and laboratory evaluation, it appears that the patient's presentation is 2/2 underlying psychiatric disorder and will require further evaluation by psychiatry.  This however seems primarily behavioral and circumstantial.   Disposition pending psychiatric evaluation.       As part of my medical decision making, I reviewed the following data within the electronic MEDICAL RECORD NUMBER Nursing notes reviewed and incorporated, Labs reviewed, notes from prior ED visits.  ____________________________________________   FINAL CLINICAL IMPRESSION(S) / ED DIAGNOSES  Final diagnoses:  Suicidal ideation      NEW MEDICATIONS STARTED DURING THIS VISIT:  New Prescriptions   No medications on file     Note:  This document was prepared using Dragon voice recognition software and may include unintentional dictation errors.    [facility]y, MD 10/12/17 [phone]

## 2017-10-16 ENCOUNTER — Ambulatory Visit (INDEPENDENT_AMBULATORY_CARE_PROVIDER_SITE_OTHER): Payer: Medicaid Other | Admitting: General Surgery

## 2017-10-16 ENCOUNTER — Encounter: Payer: Self-pay | Admitting: General Surgery

## 2017-10-16 VITALS — BP 116/72 | HR 62 | Resp 12 | Ht 62.0 in | Wt 283.0 lb

## 2017-10-16 DIAGNOSIS — K429 Umbilical hernia without obstruction or gangrene: Secondary | ICD-10-CM

## 2017-10-16 NOTE — Progress Notes (Signed)
Patient ID: Dawn Lambert, female   DOB: December 15, 1990, 27 y.o.   MRN: [id number]  Chief Complaint  Patient presents with  . Routine Post Op    HPI Dawn Lambert is a 27 y.o. female.  Here for postoperative visit, umbilical hernia repair on 09-17-17. She states she is doing well. She has not used the neosporin she has quit picking the area, which has helped. She is here with the group home caregiver, Ms [name].  She is having leg and feet pain seeing a neurologist for possible neuropathy. The group home had a over night sudden death of a staff member.  HPI  Past Medical History:  Diagnosis Date  . Anxiety   . Arnold-Chiari malformation (HCC)   . Atrial septal defect   . Complication of anesthesia    when pt was 11, woke up during surgery (muscle biopsy)  . Constipation   . Contraception management   . Dandruff   . Dermatomyositis (HCC)    age 16  . Diabetes mellitus without complication (HCC)   . Elevated blood pressure   . Hypertension   . Hypothyroidism   . Mental retardation   . Mood disorder (HCC)   . Overweight(278.02)   . Pelvic pain   . Prediabetes     Past Surgical History:  Procedure Laterality Date  . ASD REPAIR     at age 53  . UMBILICAL HERNIA REPAIR N/A 09/17/2017   Procedure: HERNIA REPAIR UMBILICAL ADULT;  Surgeon: Earline Mayotte, MD;  Location: [facility] ORS;  Service: General;  Laterality: N/A;    No family history on file.  Social History Social History   Tobacco Use  . Smoking status: Never Smoker  . Smokeless tobacco: Never Used  Substance Use Topics  . Alcohol use: No  . Drug use: No    Allergies  Allergen Reactions  . Augmentin [Amoxicillin-Pot Clavulanate] Other (See Comments)    Has patient had a PCN reaction causing immediate rash, facial/tongue/throat swelling, SOB or lightheadedness with hypotension: ______ Has patient had a PCN reaction causing severe rash involving mucus membranes or skin necrosis: _______ Has patient had a PCN  reaction that required hospitalization: _______ Has patient had a PCN reaction occurring within the last 10 years:_______ If all of the above answers are "NO", then may proceed with Cephalosporin use.     Current Outpatient Medications  Medication Sig Dispense Refill  . acetaminophen (TYLENOL) 325 MG tablet Take 650 mg by mouth every 6 (six) hours as needed for moderate pain.     . Asenapine Maleate (SAPHRIS) 10 MG SUBL Place 10 mg under the tongue 2 (two) times daily.    [name] atorvastatin (LIPITOR) 10 MG tablet Take 10 mg by mouth daily at 6 PM.     . carbamazepine (TEGRETOL) 200 MG tablet Take 400 mg by mouth 2 (two) times daily.    . cholecalciferol (VITAMIN D) 1000 units tablet Take 1,000 Units by mouth daily.     . clonazePAM (KLONOPIN) 1 MG tablet Take 1 mg by mouth 2 (two) times daily.    . clotrimazole (LOTRIMIN) 1 % cream Apply 1 application topically 2 (two) times daily.    . fluconazole (DIFLUCAN) 150 MG tablet Take 150 mg by mouth See admin instructions. Take 150mg  by mouth once, take 150mg  again if yeast symptoms return after taking prescribed clindamycin antibiotic    . Fluocinolone Acetonide Scalp (DERMA-SMOOTHE/FS SCALP) 0.01 % OIL Apply 1-5 application topically See admin instructions. Apply to scalp  every week for psoriasis but may increase up to 5 days per week as needed.    . fluticasone (FLONASE) 50 MCG/ACT nasal spray Place 2 sprays into the nose daily as needed for allergies.     [name]. gabapentin (NEURONTIN) 600 MG tablet Take 600 mg by mouth 3 (three) times daily.    [name]. levothyroxine (SYNTHROID, LEVOTHROID) 175 MCG tablet Take 175 mcg by mouth daily before breakfast.    . lisinopril (PRINIVIL,ZESTRIL) 20 MG tablet Take 20 mg by mouth daily.     [name]. lithium carbonate 300 MG capsule Take 600 mg by mouth at bedtime.     [name]. LORazepam (ATIVAN) 1 MG tablet Take 1 mg by mouth daily as needed for anxiety.     . magnesium hydroxide (MILK OF MAGNESIA) 400 MG/5ML suspension Take 30 mLs by  mouth at bedtime as needed for moderate constipation.     . medroxyPROGESTERone (DEPO-PROVERA) 150 MG/ML injection Inject 150 mg into the muscle See admin instructions. "Every 12 weeks"    . metFORMIN (GLUCOPHAGE) 500 MG tablet Take 500 mg by mouth daily.     . mometasone (ELOCON) 0.1 % cream Apply 1 application topically daily as needed (Itching of the ears).     . Multiple Vitamin (THEREMS PO) Take 1 tablet by mouth daily.    . Paliperidone Palmitate ER (INVEGA TRINZA) 819 MG/2.625ML SUSY Inject 819 mg into the muscle every 3 (three) months.    . propranolol (INDERAL) 40 MG tablet Take 40 mg by mouth 2 (two) times daily.    . QUEtiapine (SEROQUEL XR) 300 MG 24 hr tablet Take 300 mg by mouth at bedtime.    . sertraline (ZOLOFT) 100 MG tablet Take 100 mg by mouth daily.      No current facility-administered medications for this visit.     Review of Systems Review of Systems  Constitutional: Negative.   Respiratory: Negative.   Cardiovascular: Negative.     Blood pressure 116/72, pulse 62, resp. rate 12, height 5\' 2"  (1.575 m), weight 283 lb (128.4 kg), SpO2 99 %.  Physical Exam Physical Exam  Constitutional: She is oriented to person, place, and time. She appears well-developed and well-nourished.  Abdominal: Normal appearance. No hernia.    Neurological: She is alert and oriented to person, place, and time.  Skin: Skin is warm and dry.  Psychiatric: Her behavior is normal.      Assessment    Doing well after umbilical hernia repair with prosthetic mesh.    Plan    Proper lifting techniques reviewed. May resume normal activities as tolerated. Follow up as needed.     HPI, Physical Exam, Assessment and Plan have been scribed under the direction and in the presence of Earline MayotteJeffrey W. Tam Delisle, MD. Dorathy DaftMarsha Hatch, RN  I have completed the exam and reviewed the above documentation for accuracy and completeness.  I agree with the above.  [employer]useum[employer]gallery [employer] has been used and any  errors in dictation or transcription are unintentional.  Dawn CurryJeffrey Chea Malan, M.D., F.A.C.S.  Merrily PewJeffrey W Zamire Whitehurst 10/16/2017, 9:44 AM

## 2017-10-16 NOTE — Patient Instructions (Signed)
The patient is aware to call back for any questions or concerns.  

## 2017-11-03 ENCOUNTER — Emergency Department
Admission: EM | Admit: 2017-11-03 | Discharge: 2017-11-04 | Disposition: A | Discharge status: Home / Self Care | Payer: Medicaid Other | Attending: Emergency Medicine | Admitting: Emergency Medicine

## 2017-11-03 ENCOUNTER — Encounter: Payer: Self-pay | Admitting: Emergency Medicine

## 2017-11-03 ENCOUNTER — Other Ambulatory Visit: Payer: Self-pay

## 2017-11-03 DIAGNOSIS — E039 Hypothyroidism, unspecified: Secondary | ICD-10-CM | POA: Insufficient documentation

## 2017-11-03 DIAGNOSIS — I10 Essential (primary) hypertension: Secondary | ICD-10-CM | POA: Insufficient documentation

## 2017-11-03 DIAGNOSIS — F39 Unspecified mood [affective] disorder: Secondary | ICD-10-CM | POA: Diagnosis not present

## 2017-11-03 DIAGNOSIS — F29 Unspecified psychosis not due to a substance or known physiological condition: Secondary | ICD-10-CM | POA: Diagnosis present

## 2017-11-03 DIAGNOSIS — E119 Type 2 diabetes mellitus without complications: Secondary | ICD-10-CM | POA: Diagnosis not present

## 2017-11-03 DIAGNOSIS — R45851 Suicidal ideations: Secondary | ICD-10-CM | POA: Diagnosis not present

## 2017-11-03 LAB — COMPREHENSIVE METABOLIC PANEL
ALT: 26 U/L (ref 0–44)
AST: 26 U/L (ref 15–41)
Albumin: 3.8 g/dL (ref 3.5–5.0)
Alkaline Phosphatase: 81 U/L (ref 38–126)
Anion gap: 10 (ref 5–15)
BUN: 9 mg/dL (ref 6–20)
CO2: 27 mmol/L (ref 22–32)
Calcium: 9.3 mg/dL (ref 8.9–10.3)
Chloride: 98 mmol/L (ref 98–111)
Creatinine, Ser: 0.61 mg/dL (ref 0.44–1.00)
GFR calc Af Amer: >60 mL/min (ref >60)
GFR calc non Af Amer: >60 mL/min (ref >60)
Glucose, Bld: 157 mg/dL — ABNORMAL HIGH (ref 70–99)
Potassium: 3.7 mmol/L (ref 3.5–5.1)
Sodium: 135 mmol/L (ref 135–145)
Total Bilirubin: 0.7 mg/dL (ref 0.3–1.2)
Total Protein: 7.8 g/dL (ref 6.5–8.1)

## 2017-11-03 LAB — CBC
HCT: 34.4 % — ABNORMAL LOW (ref 35.0–47.0)
Hemoglobin: 11.7 g/dL — ABNORMAL LOW (ref 12.0–16.0)
MCH: 29.1 pg (ref 26.0–34.0)
MCHC: 33.9 g/dL (ref 32.0–36.0)
MCV: 85.8 fL (ref 80.0–100.0)
Platelets: 298 10*3/uL (ref 150–440)
RBC: 4.01 MIL/uL (ref 3.80–5.20)
RDW: 14.2 % (ref 11.5–14.5)
WBC: 12 10*3/uL — ABNORMAL HIGH (ref 3.6–11.0)

## 2017-11-03 LAB — URINE DRUG SCREEN, QUALITATIVE (ARMC ONLY)
Amphetamines, Ur Screen: NOT DETECTED
Benzodiazepine, Ur Scrn: NOT DETECTED
Cannabinoid 50 Ng, Ur ~~LOC~~: NOT DETECTED
Cocaine Metabolite,Ur ~~LOC~~: NOT DETECTED
MDMA (Ecstasy)Ur Screen: NOT DETECTED
Methadone Scn, Ur: NOT DETECTED
Opiate, Ur Screen: NOT DETECTED
Phencyclidine (PCP) Ur S: NOT DETECTED
Tricyclic, Ur Screen: NOT DETECTED

## 2017-11-03 LAB — ACETAMINOPHEN LEVEL: Acetaminophen (Tylenol), Serum: <10 ug/mL — ABNORMAL LOW (ref 10–30)

## 2017-11-03 LAB — ETHANOL: Alcohol, Ethyl (B): <10 mg/dL (ref <10)

## 2017-11-03 LAB — SALICYLATE LEVEL: Salicylate Lvl: <7 mg/dL (ref 2.8–30.0)

## 2017-11-03 LAB — POCT PREGNANCY, URINE: Preg Test, Ur: NEGATIVE

## 2017-11-03 MED ORDER — GABAPENTIN 600 MG PO TABS
600.0000 mg | ORAL_TABLET | Freq: Three times a day (TID) | ORAL | Status: DC
  Administered 2017-11-03 – 2017-11-04 (×2): 600 mg via ORAL
  Filled 2017-11-03 (×2): qty 1

## 2017-11-03 MED ORDER — LEVOTHYROXINE SODIUM 75 MCG PO TABS
175.0000 ug | ORAL_TABLET | Freq: Every day | ORAL | Status: DC
  Administered 2017-11-04: 175 ug via ORAL
  Filled 2017-11-03: qty 1

## 2017-11-03 MED ORDER — CARBAMAZEPINE 200 MG PO TABS
400.0000 mg | ORAL_TABLET | Freq: Two times a day (BID) | ORAL | Status: DC
  Administered 2017-11-03 – 2017-11-04 (×2): 400 mg via ORAL
  Filled 2017-11-03 (×2): qty 2

## 2017-11-03 MED ORDER — LORAZEPAM 2 MG PO TABS: 2.0000 mg | ORAL_TABLET | Freq: Once | ORAL | Status: DC

## 2017-11-03 MED ORDER — ASENAPINE MALEATE 5 MG SL SUBL
10.0000 mg | SUBLINGUAL_TABLET | Freq: Two times a day (BID) | SUBLINGUAL | Status: DC
  Administered 2017-11-03 – 2017-11-04 (×2): 10 mg via SUBLINGUAL
  Filled 2017-11-03 (×5): qty 2

## 2017-11-03 MED ORDER — ATORVASTATIN CALCIUM 10 MG PO TABS: 10.0000 mg | ORAL_TABLET | Freq: Every day | ORAL | Status: DC

## 2017-11-03 MED ORDER — QUETIAPINE FUMARATE ER 300 MG PO TB24
300.0000 mg | ORAL_TABLET | Freq: Every day | ORAL | Status: DC
  Administered 2017-11-03: 300 mg via ORAL
  Filled 2017-11-03 (×2): qty 1

## 2017-11-03 MED ORDER — ACETAMINOPHEN 325 MG PO TABS: 650.0000 mg | ORAL_TABLET | Freq: Four times a day (QID) | ORAL | Status: DC | PRN

## 2017-11-03 MED ORDER — SERTRALINE HCL 100 MG PO TABS
100.0000 mg | ORAL_TABLET | Freq: Every day | ORAL | Status: DC
  Administered 2017-11-04: 100 mg via ORAL
  Filled 2017-11-03: qty 1

## 2017-11-03 MED ORDER — LORAZEPAM 2 MG/ML IJ SOLN: 2.0000 mg | Freq: Once | INTRAMUSCULAR | Status: DC

## 2017-11-03 MED ORDER — METFORMIN HCL 500 MG PO TABS
500.0000 mg | ORAL_TABLET | Freq: Every day | ORAL | Status: DC
  Administered 2017-11-04: 500 mg via ORAL
  Filled 2017-11-03: qty 1

## 2017-11-03 MED ORDER — LISINOPRIL 20 MG PO TABS
20.0000 mg | ORAL_TABLET | Freq: Every day | ORAL | Status: DC
  Administered 2017-11-04: 20 mg via ORAL
  Filled 2017-11-03: qty 1

## 2017-11-03 MED ORDER — MAGNESIUM HYDROXIDE 400 MG/5ML PO SUSP
30.0000 mL | Freq: Every evening | ORAL | Status: DC | PRN
  Filled 2017-11-03: qty 30

## 2017-11-03 MED ORDER — LITHIUM CARBONATE 300 MG PO CAPS
600.0000 mg | ORAL_CAPSULE | Freq: Every day | ORAL | Status: DC
  Administered 2017-11-03: 600 mg via ORAL
  Filled 2017-11-03: qty 2

## 2017-11-03 MED ORDER — LORAZEPAM 2 MG/ML IJ SOLN
INTRAMUSCULAR | Status: AC
  Filled 2017-11-03: qty 1

## 2017-11-03 MED ORDER — CLONAZEPAM 1 MG PO TABS
1.0000 mg | ORAL_TABLET | Freq: Two times a day (BID) | ORAL | Status: DC
  Administered 2017-11-03 – 2017-11-04 (×2): 1 mg via ORAL
  Filled 2017-11-03: qty 1
  Filled 2017-11-03: qty 2

## 2017-11-03 MED ORDER — PROPRANOLOL HCL 10 MG PO TABS
40.0000 mg | ORAL_TABLET | Freq: Two times a day (BID) | ORAL | Status: DC
  Administered 2017-11-03 – 2017-11-04 (×2): 40 mg via ORAL
  Filled 2017-11-03: qty 4
  Filled 2017-11-03: qty 2

## 2017-11-03 MED ORDER — LORAZEPAM 2 MG/ML IJ SOLN
2.0000 mg | Freq: Once | INTRAMUSCULAR | Status: AC
  Administered 2017-11-03: 2 mg via INTRAMUSCULAR

## 2017-11-03 MED ORDER — LORAZEPAM 1 MG PO TABS: 1.0000 mg | ORAL_TABLET | Freq: Every day | ORAL | Status: DC | PRN

## 2017-11-03 MED ORDER — LORAZEPAM 1 MG PO TABS
ORAL_TABLET | ORAL | Status: AC
  Filled 2017-11-03: qty 2

## 2017-11-03 NOTE — ED Notes (Signed)
Called [facility] @ [phone] Left message on machine.

## 2017-11-03 NOTE — ED Provider Notes (Signed)
[facility] Emergency Department Provider Note       Time seen: ----------------------------------------- 6:46 PM on 11/03/2017 -----------------------------------------   I have reviewed the triage vital signs and the nursing notes.  HISTORY   Chief Complaint Aggressive Behavior    HPI Dawn Lambert is a 27 y.o. female with a history of anxiety, diabetes, hypertension, hypothyroidism, mental retardation, mood disorder who presents to the ED for wanting to hurt herself.  Patient states if she does not go to a new group home she is going to kill herself.  She states she is been there for 7 years, she had a plan to take a knife and cut herself.  She denies any current suicidal or homicidal ideation but feels angry when she is at the group home.  IVC paperwork states she was standing on the street corner naked stating she would harm herself if she had to go back to the group home.  Past Medical History:  Diagnosis Date  . Anxiety   . Arnold-Chiari malformation (HCC)   . Atrial septal defect   . Complication of anesthesia    when pt was 11, woke up during surgery (muscle biopsy)  . Constipation   . Contraception management   . Dandruff   . Dermatomyositis (HCC)    age 5  . Diabetes mellitus without complication (HCC)   . Elevated blood pressure   . Hypertension   . Hypothyroidism   . Mental retardation   . Mood disorder (HCC)   . Overweight(278.02)   . Pelvic pain   . Prediabetes     Patient Active Problem List   Diagnosis Date Noted  . 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 Intellectual disability 10/12/2017  . Umbilical hernia without obstruction and without gangrene 08/15/2017  . Chest pain 11/05/2012  . Atrial septal defect 09/19/2011    Past Surgical History:  Procedure Laterality Date  . ASD REPAIR     at age 81  . UMBILICAL HERNIA REPAIR N/A 09/17/2017   Procedure: HERNIA REPAIR UMBILICAL  ADULT;  Surgeon: Earline Mayotte, MD;  Location: [facility] ORS;  Service: General;  Laterality: N/A;    Allergies Augmentin [amoxicillin-pot clavulanate]  Social History Social History   Tobacco Use  . Smoking status: Never Smoker  . Smokeless tobacco: Never Used  Substance Use Topics  . Alcohol use: No  . Drug use: No   Review of Systems Constitutional: Negative for fever. Cardiovascular: Negative for chest pain. Respiratory: Negative for shortness of breath. Gastrointestinal: Negative for abdominal pain, vomiting and diarrhea. Musculoskeletal: Negative for back pain. Skin: Negative for rash. Neurological: Negative for headaches, focal weakness or numbness. Psychiatric: Positive for suicidal ideation   All systems negative/normal/unremarkable except as stated in the HPI  ____________________________________________   PHYSICAL EXAM:  VITAL SIGNS: ED Triage Vitals  Enc Vitals Group     BP 11/03/17 1830 (!) 144/88     Pulse Rate 11/03/17 1830 84     Resp 11/03/17 1830 14     Temp 11/03/17 1830 98.1 F (36.7 C)     Temp Source 11/03/17 1830 Oral     SpO2 11/03/17 1830 99 %     Weight 11/03/17 1827 283 lb (128.4 kg)     Height 11/03/17 1827 5\' 2"  (1.575 m)     Head Circumference --      Peak Flow --      Pain Score 11/03/17 1827 0     Pain  Loc --      Pain Edu? --      Excl. in GC? --    Constitutional: Alert and oriented.  No distress Eyes: Conjunctivae are normal. Normal extraocular movements. ENT   Head: Normocephalic and atraumatic.   Nose: No congestion/rhinnorhea.   Mouth/Throat: Mucous membranes are moist.   Neck: No stridor. Cardiovascular: Normal rate, regular rhythm. No murmurs, rubs, or gallops. Respiratory: Normal respiratory effort without tachypnea nor retractions. Breath sounds are clear and equal bilaterally. No wheezes/rales/rhonchi. Gastrointestinal: Soft and nontender. Normal bowel sounds Musculoskeletal: Nontender with normal  range of motion in extremities. No lower extremity tenderness nor edema. Neurologic:  Normal speech and language. No gross focal neurologic deficits are appreciated.  Skin:  Skin is warm, dry and intact. No rash noted. Psychiatric: Mood and affect are normal.  ____________________________________________  ED COURSE:  As part of my medical decision making, I reviewed the following data within the electronic MEDICAL RECORD NUMBER History obtained from family if available, nursing notes, old chart and ekg, as well as notes from prior ED visits. Patient presented for possible suicidal ideation although she states if she could go to a new group home she would not be suicidal anymore.  This does not appear to meet commitment criteria.   Procedures ____________________________________________   LABS (pertinent positives/negatives)  Labs Reviewed  COMPREHENSIVE METABOLIC PANEL - Abnormal; Notable for the following components:      Result Value   Glucose, Bld 157 (*)    All other components within normal limits  CBC - Abnormal; Notable for the following components:   WBC 12.0 (*)    Hemoglobin 11.7 (*)    HCT 34.4 (*)    All other components within normal limits  ETHANOL  SALICYLATE LEVEL  ACETAMINOPHEN LEVEL  URINE DRUG SCREEN, QUALITATIVE ([facility] ONLY)  POC URINE PREG, ED  POCT PREGNANCY, URINE  ____________________________________________  DIFFERENTIAL DIAGNOSIS   Mental retardation, mood disorder, medication noncompliance  FINAL ASSESSMENT AND PLAN  Mood disorder, mental retardation  Plan: The patient had presented for making threats to harm herself because she does not like her living environment. Patient's labs are unremarkable.  Does not meet commitment criteria.  She is cleared for outpatient follow-up with her act team   Ulice DashJohnathan E Williams, MD   Note: This note was generated in part or whole with voice recognition software. Voice recognition is usually quite accurate but  there are transcription errors that can and very often do occur. I apologize for any typographical errors that were not detected and corrected.     Emily FilbertWilliams, [name], MD 11/03/17 Nicholos [id number]

## 2017-11-03 NOTE — ED Notes (Signed)
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
## 2017-11-03 NOTE — ED Notes (Signed)
Called Rennee Croll(legal guardian and mother) @ [phone] Left message on machine to call hospital.

## 2017-11-03 NOTE — ED Notes (Signed)
Called Dawn Lambert(Legal guardian and mother of patient) @ [phone].  Stated she was 2 hours away but would contact [facility] to arrange pick-up of patient.

## 2017-11-03 NOTE — ED Notes (Signed)
She verbalizes that she has a [employer] coordinator from [employer]  - her name is Arnoldo HookerMarilyn Moore

## 2017-11-03 NOTE — ED Notes (Signed)
Pt. Leaving with group home staff, Guardian made aware.

## 2017-11-03 NOTE — ED Triage Notes (Signed)
Arrives with [employer] for evaluation.  Patient states she had a disagreement with one her morning staff, who is back tonight. Patient states "I made a statement that I would hurt myself or kill myself, or hurt them or kill them - unless I was moved to a different group home, the hospital, or jail".  Patient denies current SI/ HI but states feels angry when at the group home.  [facility] is where patient lives.  Patient is calm and cooperative at this time.

## 2017-11-03 NOTE — ED Notes (Signed)
Called Dawn Lambert(mother and guardian of patient) let her know patient did not want to go back to group home.

## 2017-11-04 NOTE — ED Provider Notes (Signed)
Patient has been cleared to return to her group home.   Dawn Lambert, [name], MD 11/04/17 [phone]

## 2017-11-04 NOTE — ED Notes (Addendum)
Pt will be discharged back to group home today. Dr. Elio ForgetJames Graham left his personal cell phone number if any issues arise [phone].   Maintained on 15 minute checks and observation by security camera for safety.

## 2017-11-04 NOTE — Clinical Social Work Note (Signed)
Clinical Social Work Assessment  Patient Details  Name: Dawn Lambert MRN: [id number] Date of Birth: Dec 02, 1990  Date of referral:  11/04/17               Reason for consult:  Discharge Planning                Permission sought to share information with:  Family Supports, Guardian, Magazine features editoracility Contact Representative Permission granted to share information::  Yes, Verbal Permission Granted  Name::     Dawn Lambert [phone] Dr Elio ForgetJames Graham   Agency::  [facility] [id number]  Relationship::     Contact Information:     Housing/Transportation Living arrangements for the past 2 months:  Group Home Source of Information:  Medical Team, Patient, Parent Patient Interpreter Needed:  None Criminal Activity/Legal Involvement Pertinent to Current Situation/Hospitalization:  No - Comment as needed Significant Relationships:  Merchandiser, retailCommunity Support, Mental Health Provider, Friend, Parents Lives with:  Facility Resident Do you feel safe going back to the place where you live?  Yes Need for family participation in patient care:  Yes (Comment)  Care giving concerns: Mother/guardian wants her to return to Group Home ASAP   Social Worker assessment / plan: LCSW introduced myself to patient guardian Dawn Lambert and explained I would like to do an assessment and Fl2 on patient as she is having placement issues at her group home. The guardian stated that SHE is to only return to current group home and would call the Director of [facility] to make this happen. Patients guardian reported that patient will act out when she does not get her way but reports she has never seriously hurt anyone but does verbalize it often.  Guardian reports there is a diagnosis of MR below 70 ODD Bipolar and other diagnoses but couldn't recall them all. It was explained that patient appears to be fine in hospital and does not meet inpatient criteria and is discharged back to group home. Guardian agrees and will  contact group home to ensure her daughter returns there. [facility] Cheree DittoGraham and he is going to meet with patient and advocate to make positive changes to accommodate patient. She will go to Crisis unit Group home for 14 days.  Employment status:  Disabled (Comment on whether or not currently receiving Disability)(MR below 70) Insurance information:  [place] Recommend[place]:    Information / Referral to community resources:     Patient/Family's Response to care: Daughter to return to [facility]  Patient/Family's Understanding of and Emotional Response to Diagnosis, Current Treatment, and Prognosis: Guardian agrees that her daughter is being behavioral not pyschotic and should return to group home.  Emotional Assessment Appearance:  Appears stated age Attitude/Demeanor/Rapport:  Attention Seeking, Engaged Affect (typically observed):  Apprehensive Orientation:  Oriented to Self, Oriented to Place, Oriented to  Time Alcohol / Substance use:  Not Applicable Psych involvement (Current and /or in the community):  Yes (Comment)(UNknown)  Discharge Needs  Concerns to be addressed:  No discharge needs identified Readmission within the last 30 days:  No Current discharge risk:  None Barriers to Discharge:  No Barriers Identified   Cheron SchaumannBandi, Anson Peddie M, LCSW 11/04/2017, 9:42 AM

## 2017-11-04 NOTE — Progress Notes (Signed)
LCSW met with Dr Phillip Heal who facilitated completing and new ISP with his resident who was agreeable to return to group home. In the goal setting patient agreed not to act out with staff.  LCSW observed this interaction and Dr Phillip Heal will arrange for patient to be picked up and returned to group home.  No further SW needs.  [employer] LCSW [phone]

## 2017-11-04 NOTE — ED Notes (Signed)
Patient discharged back to group home (guardian consent given) via group home director.  Pt denies SI/HI. VS stable. All belongings returned to patient.

## 2017-11-04 NOTE — ED Notes (Signed)
Pt up to use restroom with no assistance needed.

## 2017-11-04 NOTE — Progress Notes (Signed)
LCSW consulted with EDP and ED RN and informed them the Group Home director Dr Dawn Lambert will be meeting with patient at 11:30 and will negotiate a contract with patient and she will return to group home today. This worker will be notified and partake in his intervention with patient. He is hopefull with advocacy and behavioral modification strategies he will have a positive outcome with patient. He indicated that patient will return to a crisis group home where she will be housed for up to 14 days and supported. Guardian Dawn Lambert is in agreeable with this plan.  Meeting scheduled for 11:30-12:30   Delta Air LinesClaudine Tyriq Moragne LCSW [phone]

## 2017-11-04 NOTE — Progress Notes (Signed)
LCSW called and spoke to patients Guardian Dawn Lambert [phone] and she did not want to participate in assessment or Fl2 as her daughter is to return to Group Home.This worker informed guardian as per discussion with Dr [name] the patient does not meet the criteria to remain in hospital.  Guardian endorsed that her daughter is behavioral and will go to extreme lengths to get what she would like ( learned behavior) she reports that she will not carry out threats of harm but she would verbalize them. Guardian will contact group home and they are to pick her up and return her to the group home. Patient has in community supports and her medications can be monitored by Dr Iris PertHariot Burns.  LCSW will complete assessment and is awaiting a call back from Group [facility] [phone]

## 2017-11-04 NOTE — ED Notes (Addendum)
The group home director, Dr. Jolyn NapGraham Jones, along with LCSW are speaking with the patient at this time.   Maintained on 15 minute checks and observation by security camera for safety.
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
## 2017-11-04 NOTE — ED Notes (Signed)
Pt calm and cooperative. Pt is requesting to speak with LCSW. RN explained the SW would be here to speak with her later today. Pt accepting.   Maintained on 15 minute checks and observation by security camera for safety.

## 2017-11-15 ENCOUNTER — Ambulatory Visit
Admission: RE | Admit: 2017-11-15 | Discharge: 2017-11-15 | Disposition: A | Discharge status: Home / Self Care | Payer: Medicaid Other | Source: Ambulatory Visit | Attending: Internal Medicine | Admitting: Internal Medicine

## 2017-11-15 ENCOUNTER — Other Ambulatory Visit: Payer: Self-pay | Admitting: Internal Medicine

## 2017-11-15 DIAGNOSIS — M5144 Schmorl's nodes, thoracic region: Secondary | ICD-10-CM | POA: Diagnosis not present

## 2017-11-15 DIAGNOSIS — M545 Low back pain: Secondary | ICD-10-CM

## 2017-12-05 ENCOUNTER — Ambulatory Visit: Payer: Medicaid Other | Attending: Internal Medicine | Admitting: Physical Therapy

## 2017-12-05 ENCOUNTER — Encounter: Payer: Self-pay | Admitting: Physical Therapy

## 2017-12-05 DIAGNOSIS — M5440 Lumbago with sciatica, unspecified side: Secondary | ICD-10-CM | POA: Insufficient documentation

## 2017-12-05 DIAGNOSIS — R2689 Other abnormalities of gait and mobility: Secondary | ICD-10-CM | POA: Insufficient documentation

## 2017-12-05 DIAGNOSIS — M6281 Muscle weakness (generalized): Secondary | ICD-10-CM | POA: Insufficient documentation

## 2017-12-05 DIAGNOSIS — M62830 Muscle spasm of back: Secondary | ICD-10-CM | POA: Insufficient documentation

## 2017-12-05 DIAGNOSIS — M546 Pain in thoracic spine: Secondary | ICD-10-CM | POA: Insufficient documentation

## 2017-12-05 DIAGNOSIS — M545 Low back pain: Secondary | ICD-10-CM | POA: Insufficient documentation

## 2017-12-05 DIAGNOSIS — M79672 Pain in left foot: Secondary | ICD-10-CM | POA: Diagnosis not present

## 2017-12-05 DIAGNOSIS — M79671 Pain in right foot: Secondary | ICD-10-CM | POA: Diagnosis not present

## 2017-12-05 NOTE — Therapy (Signed)
[facility]ICINE [address] [name], [address] Phone: [phone]   Fax:  [phone]  Physical Therapy Evaluation  Patient Details  Name: Dawn Lambert MRN: [id number] Date of Birth: 05/11/1990 Referring Provider: Fulton Mole MD   Encounter Date: 12/05/2017  PT End of Session - 12/05/17 1018    Visit Number  1    Number of Visits  10    Date for PT Re-Evaluation  01/30/18    Authorization Type  Mcaid    PT Start Time  1000    PT Stop Time  1058    PT Time Calculation (min)  58 min    Activity Tolerance  Patient tolerated treatment well    Behavior During Therapy  [facility] for tasks assessed/performed       Past Medical History:  Diagnosis Date  . Anxiety   . Arnold-Chiari malformation (HCC)   . Atrial septal defect   . Complication of anesthesia    when pt was 11, woke up during surgery (muscle biopsy)  . Constipation   . Contraception management   . Dandruff   . Dermatomyositis (HCC)    age 36  . Diabetes mellitus without complication (HCC)   . Elevated blood pressure   . Hypertension   . Hypothyroidism   . Mental retardation   . Mood disorder (HCC)   . Overweight(278.02)   . Pelvic pain   . Prediabetes     Past Surgical History:  Procedure Laterality Date  . ASD REPAIR     at age 27  . UMBILICAL HERNIA REPAIR N/A 09/17/2017   Procedure: HERNIA REPAIR UMBILICAL ADULT;  Surgeon: Earline Mayotte, MD;  Location: [facility] ORS;  Service: General;  Laterality: N/A;    There were no vitals filed for this visit.   Subjective Assessment - 12/05/17 1008    Subjective  Patient reports that her pain today is in L sided mid back pain. She states that b/l legs and hips bother her on and off as well.     Patient is accompained by:  -- caregiver    Pertinent History  Patient complains of generalized pain. States her main complaint is her mid back and b/l legs. Reports her pain radiates to b/l  posterior calves in multiple positions, unable to identify what activities are the most aggravating. Case worker states that the patient complains a lot with prolonged walking, including leg cramps. States her back started in June 2019, reports a hernia repair 09/17/17. Since onset her pain has not worsened.  Medication (Tylenol) allievates symptoms. Patient also reports a decrease in physical activity since onset of pain.     Limitations  Walking;Sitting;Standing    Patient Stated Goals  Patient would like to return to previous activities (walking, swimming)    Currently in Pain?  Yes    Pain Score  4  worst: 6 best: 0 with rest     Pain Location  Generalized lower back, left back today    Pain Orientation  Left;Posterior;Lower;Right    Pain Descriptors / Indicators  Other (Comment) "hurts"    Pain Type  Chronic pain    Pain Radiating Towards  b/l posterior legs    Pain Onset  More than a month ago    Pain Frequency  Intermittent    Aggravating Factors   depends on day and activity; reports all positions will aggravate symptoms    Pain Relieving Factors  medication, rest  [facility] PT Assessment - 12/05/17 1107      Assessment   Medical Diagnosis  low back pain    Referring Provider  Fulton Mole MD    Onset Date/Surgical Date  09/29/17    Prior Therapy  yes 2018 for back pain      Precautions   Precautions  None      Home Environment   Additional Comments  Pt lives in a group home ([facility]). Assistted living facitlity. No stairs to enter, no stairs inside.       Prior Function   Vocation  On disability    Leisure  music, walking, exercise, swimming      Cognition   Overall Cognitive Status  Within Functional Limits for tasks assessed      Posture/Postural Control   Posture Comments  slouched posture sittting and standing, increased lumbar lordosis      ROM / Strength   AROM / PROM / Strength  AROM;Strength       Patient reports aggravation of pain with  lumbar flexion and extension, worse with extension. Lateral flexion did not change patient's pain.  AROM: Limited lumbar flexion (80%), extension (25%) Lateral flexion WFLs without increased symptoms   Flexibility:  HS tight b/l (mild) complaints of back pain Hip flexor and IT band on L: WFLs, complaints of R hip pain with flexion Hip flexor and IT band on R: WFLs Patient reports pain with PA mobilizations to lumbar spine, PSIS Palpation: patient TTP of lumbar/thoracic paraspinals with mild tightness around L4-L5  Special tests:  Slump test: mildly mildly b/l SLR b/l: mildly positive on R Crossed straight leg raise: negative bilaterally FABER ROM is good, reports of hip pain in hip ER  Patient reports no pain when lying in prone  Strength: Hip flexion: pain with L hip flexion; unable to assess either hip accurately due to patient not following command consistently Knee extension: bilateral WFL no pain reported Knee flexion: bilateral WFL no pain reported Ankle DF: WFL bilateral  Hip abduction: Right grossly WFL; Left LE 3-/5  Hip extension: bilateral grossly WFL with reported pain with right hip   Therapeutic Exercises: sidelying clamshells b/l 1x10 with verbal/tactile cues to address hip ER weakness  Instructed in prone lying 2-3x a day with verbal cues,  and then static prone on elbows x 2 min for pain management & improved lumbar mobility.   Sitting isometric hip adduction with ball squeezes 1x10 of b/l adductors and glutes to address muscle weakness and to encourage core activation/strengthening.  Patient educated about proper body mechanics with handout, demonstrate and verbal explanation, patient verbalized understanding.    Objective measurements completed on examination: See above findings.         PT Education - 12/05/17 1115    Education provided  Yes    Education Details  POC: body mechanics; HEP    Person(s) Educated  Patient;Caregiver(s)    Methods   Explanation;Demonstration;Verbal cues;Handout    Comprehension  Verbalized understanding;Returned demonstration;Verbal cues required;Need further instruction       PT Short Term Goals - 12/05/17 1330      PT SHORT TERM GOAL #1   Title  Patient will demonstrate proper posture, body mechanics for bed mobility, daily functional tasks to assist with pain control in back and LE's    Baseline  Patient requires guidance, instruction and has limited knowledge of appropriate posture, body mechanics for daily activities    Status  New    Target  Date  12/26/17      PT SHORT TERM GOAL #2   Title  Patient will demonstrate improved self perceived impairment with daily tasks with decreased pain in back and LE's as indicated by MODI improvement by 12% or more    Baseline  to be assessed next session    Status  New    Target Date  01/02/18        PT Long Term Goals - 12/05/17 1337      PT LONG TERM GOAL #1   Title  Patient will demonstrate improved self perceived impairment with daily tasks with decreased pain in back and LE's as indicated by MODI improvement to less than 20%    Baseline  to be assessed next session    Status  New    Target Date  01/30/18      PT LONG TERM GOAL #2   Title  Patient will report max pain level of 3/10 in back and LE's with aggravating activities to allow improved function with daily tasks     Baseline  max pain level 6/10 with aggravating activities    Status  New    Target Date  01/30/18      PT LONG TERM GOAL #3   Title  Patient will be independent with home program for proper body mechanics, exercises for strength and flexibility to allow transition to self management when discharged from pysical therpay    Baseline  patient has limited to no knowledge of appropriate exercises or progression without cuing, instruction     Status  New    Target Date  01/30/18             Plan - 12/05/17 1127    Clinical Impression Statement  Patient is 27 yo female  that has chief concern of generalized pain, specifically low back pain, bilateral LE pain and bilateral hip pain. Patient reports that her back pain started after a hernia repair in May 2019. The patient presents with postural abnormalities, decreased and painful lumbar ROM, decreased LE strength and endurance as well as difficulties with functional activities such as walking, sitting, and physical activities. The patient would benefit from further skilled physical therapy intervention to address these deficits including ability to perform functional tasks.      History and Personal Factors relevant to plan of care:  Patient complains of generalized pain. States her main complaint is her mid back and b/l legs. Reports her pain radiates to b/l posterior calves in multiple positions, unable to identify what activities are the most aggravating. Case worker states that the patient complains a lot with prolonged walking, including leg cramps. States her back started in June 2019, reports a hernia repair 09/17/17. Since onset her pain has not worsened.  Medication (Tylenol) allievates symptoms. Patient also reports a decrease in physical activity since onset of pain.     Clinical Presentation  Stable    Clinical Presentation due to:  no change in symptoms from initial onset    Clinical Decision Making  Low    Rehab Potential  Good    Clinical Impairments Affecting Rehab Potential  HTN; DM; previous history of back pain; multiple co morbidities    PT Frequency  Other (comment) 1-2x a week    PT Duration  8 weeks    PT Treatment/Interventions  Patient/family education;Therapeutic activities;Therapeutic exercise;Aquatic Therapy;Cryotherapy;Electrical Stimulation;Iontophoresis 4mg /ml Dexamethasone;Moist Heat;Traction;Ultrasound;Balance training;Functional mobility training;Gait training;Neuromuscular re-education;Dry needling;Passive range of motion;Manual techniques;Energy conservation    PT Next Visit  Plan  FOTO,  MODI,  progress exercises, stretches     PT Home Exercise Plan  body mechanics, clamshells, adduction/glute isometrics with ball, prone lying, prone on elbows    Consulted and Agree with Plan of Care  Patient;Family member/caregiver    Family Member Consulted  Caregiver       Patient will benefit from skilled therapeutic intervention in order to improve the following deficits and impairments:  Pain, Improper body mechanics, Postural dysfunction, Difficulty walking, Decreased strength, Hypomobility, Decreased endurance, Decreased range of motion, Impaired flexibility, Impaired perceived functional ability, Decreased activity tolerance  Visit Diagnosis: Low back pain with sciatica, sciatica laterality unspecified, unspecified back pain laterality, unspecified chronicity - Plan: PT plan of care cert/re-cert  Muscle weakness (generalized) - Plan: PT plan of care cert/re-cert  Other abnormalities of gait and mobility - Plan: PT plan of care cert/re-cert     Problem List Patient Active Problem List   Diagnosis Date Noted  . 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 Intellectual disability 10/12/2017  . Umbilical hernia without obstruction and without gangrene 08/15/2017  . Chest pain 11/05/2012  . Atrial septal defect 09/19/2011    Beacher May PT 12/05/2017, 1:49 PM  [facility] [facility] PHYSICAL AND SPORTS MEDICINE [address] [name], [address] Phone: [phone]   Fax:  [phone]  Name: Dawn Lambert MRN: [id number] Date of Birth: Mar 31, 1991

## 2017-12-12 ENCOUNTER — Encounter: Payer: Self-pay | Admitting: Physical Therapy

## 2017-12-12 ENCOUNTER — Ambulatory Visit: Payer: Medicaid Other | Admitting: Physical Therapy

## 2017-12-12 DIAGNOSIS — M6281 Muscle weakness (generalized): Secondary | ICD-10-CM

## 2017-12-12 DIAGNOSIS — M62830 Muscle spasm of back: Secondary | ICD-10-CM

## 2017-12-12 DIAGNOSIS — R2689 Other abnormalities of gait and mobility: Secondary | ICD-10-CM

## 2017-12-12 DIAGNOSIS — M5440 Lumbago with sciatica, unspecified side: Secondary | ICD-10-CM

## 2017-12-12 NOTE — Therapy (Signed)
[facility] [place] [facility]IC[facility] [address] [place], [place], [address] Phone: [phone]   Fax:  [phone]  Physical Therapy Treatment  Patient Details  Name: Dawn Lambert MRN: [id number] Date of Birth: 1990/11/09 Referring Provider: Fulton MoleBurns, Harriett MD   Encounter Date: 12/12/2017  PT End of Session - 12/12/17 1338    Visit Number  2    Number of Visits  10    Date for PT Re-Evaluation  01/30/18    Authorization Type  1/3 [employer] (8/14 -9/13)    PT Start Time  1147    PT Stop Time  1230    PT Time Calculation (min)  43 min    Activity Tolerance  Patient tolerated treatment well    Behavior During Therapy  [facility] for tasks assessed/performed       Past Medical History:  Diagnosis Date  . Anxiety   . Arnold-Chiari malformation (HCC)   . Atrial septal defect   . Complication of anesthesia    when pt was 11, woke up during surgery (muscle biopsy)  . Constipation   . Contraception management   . Dandruff   . Dermatomyositis (HCC)    age 89  . Diabetes mellitus without complication (HCC)   . Elevated blood pressure   . Hypertension   . Hypothyroidism   . Mental retardation   . Mood disorder (HCC)   . Overweight(278.02)   . Pelvic pain   . Prediabetes     Past Surgical History:  Procedure Laterality Date  . ASD REPAIR     at age 89  . UMBILICAL HERNIA REPAIR N/A 09/17/2017   Procedure: HERNIA REPAIR UMBILICAL ADULT;  Surgeon: [name], [name], MD;  Location: [facility] ORS;  Service: General;  Laterality: N/A;    Subjective Assessment - 12/12/17 1149    Subjective  Patient reports that she has been doing her exercises at home, except for the hip adduction exercise since she does not have a ball. States her pain is 2/10 at the start of session.     Pertinent History  Patient complains of generalized pain. States her main complaint is her mid back and b/l legs. Reports her pain radiates to b/l posterior calves in multiple  positions, unable to identify what activities are the most aggravating. Case worker states that the patient complains a lot with prolonged walking, including leg cramps. States her back started in June 2019, reports a hernia repair 09/17/17. Since onset her pain has not worsened.  Medication (Tylenol) allievates symptoms. Patient also reports a decrease in physical activity since onset of pain.     Limitations  Walking;Sitting;Standing    Patient Stated Goals  Patient would like to return to previous activities (walking, swimming)    Currently in Pain?  Yes    Pain Score  2     Pain Location  Generalized   back, posterior thighs   Pain Orientation  Lower;Posterior;Right;Left    Pain Descriptors / Indicators  Other (Comment)   "hurts"   Pain Type  Chronic pain    Pain Radiating Towards  b/l posterior legs    Pain Onset  More than a month ago    Pain Frequency  Intermittent       Objective:  Palpation: mild spasms lumbar spine paraspinal muscles b/l with hypersensitivity   Treatment:  Manual Therapy:  Prone: STM to b/l posterior hip/back musculature (glutes, lumbar fascia, lumbar paraspinals) to improve soft tissue integrity x 10mins  Therapeutic  Exercises:  prone lying with cues for breathing/relaxation, no change in leg symptoms noted. Prop on elbows 2 reps x1 min ea Goal: centralization of symptoms side lying hip flexor stretch with PT x 20 seconds, 3 reps  supine: hamstring stretch right/left LE 3 x 20 seconds with PT bridging 2 x 10 with VC & tactile cues for TrA contraction and proper technique  side lying clamshells 2 x 10 each LE with decreased strength noted on left LE side lying right performed left hip abduction isometric with assistance of therapist for correct position/alignment 5 second hold x 5 reps  sitting at edge of treatment table hip adduction with pillow/towel between knees 2 x 10 reps with VC for correct technique  instructed in proper log rolliing for  transfers in/out of bed; patient performed 3x with VC for proper sequencing  Patient response to treatment: patient demonstrated improved technique with exercises with moderate tactile and VC for correct alignment. Patient with decreased pain from   2/10 to  1/10. Patient with decreased spasms by 25% following STM   PT Education - 12/12/17 1338    Education provided  Yes    Education Details  body mechanics, HEP exercises    Person(s) Educated  Patient;Caregiver(s)    Methods  Explanation;Demonstration;Verbal cues    Comprehension  Verbalized understanding;Returned demonstration;Verbal cues required;Tactile cues required       PT Short Term Goals - 12/05/17 1330      PT SHORT TERM GOAL #1   Title  Patient will demonstrate proper posture, body mechanics for bed mobility, daily functional tasks to assist with pain control in back and LE's    Baseline  Patient requires guidance, instruction and has limited knowledge of appropriate posture, body mechanics for daily activities    Status  New    Target Date  12/26/17      PT SHORT TERM GOAL #2   Title  Patient will demonstrate improved self perceived impairment with daily tasks with decreased pain in back and LE's as indicated by MODI improvement by 12% or more    Baseline  to be assessed next session    Status  New    Target Date  01/02/18        PT Long Term Goals - 12/05/17 1337      PT LONG TERM GOAL #1   Title  Patient will demonstrate improved self perceived impairment with daily tasks with decreased pain in back and LE's as indicated by MODI improvement to less than 20%    Baseline  to be assessed next session    Status  New    Target Date  01/30/18      PT LONG TERM GOAL #2   Title  Patient will report max pain level of 3/10 in back and LE's with aggravating activities to allow improved function with daily tasks     Baseline  max pain level 6/10 with aggravating activities    Status  New    Target Date  01/30/18      PT  LONG TERM GOAL #3   Title  Patient will be independent with home program for proper body mechanics, exercises for strength and flexibility to allow transition to self management when discharged from pysical therpay    Baseline  patient has limited to no knowledge of appropriate exercises or progression without cuing, instruction     Status  New    Target Date  01/30/18  Plan - 12/12/17 1340    Clinical Impression Statement  Patient demonstrates limitations in tissue elasticity/integrity of low back and b/ hips, improvement noted after STM and stretching with PT. Patient reports that her pain improved to 1/10 post session. PT and patient spent time reviewing body mechanics as well as HEP to ensure proper form, verbal/tactile cues needed throughout session. The patient would benefit from further skilled PT intervention to continue to progress towards goals.     Rehab Potential  Good    Clinical Impairments Affecting Rehab Potential  HTN; DM; previous history of back pain; multiple co morbidities    PT Frequency  Other (comment)   1-2x a week   PT Duration  8 weeks    PT Treatment/Interventions  Patient/family education;Therapeutic activities;Therapeutic exercise;Aquatic Therapy;Cryotherapy;Electrical Stimulation;Iontophoresis 4mg /ml Dexamethasone;Moist Heat;Traction;Ultrasound;Balance training;Functional mobility training;Gait training;Neuromuscular re-education;Dry needling;Passive range of motion;Manual techniques;Energy conservation    PT Next Visit Plan  FOTO, MODI,  progress exercises, stretches     PT Home Exercise Plan  body mechanics, clamshells, adduction/glute isometrics with ball, prone lying, prone on elbows    Consulted and Agree with Plan of Care  Patient;Family member/caregiver    Family Member Consulted  Caregiver       Patient will benefit from skilled therapeutic intervention in order to improve the following deficits and impairments:  Pain, Improper body  mechanics, Postural dysfunction, Difficulty walking, Decreased strength, Hypomobility, Decreased endurance, Decreased range of motion, Impaired flexibility, Impaired perceived functional ability, Decreased activity tolerance  Visit Diagnosis: Low back pain with sciatica, sciatica laterality unspecified, unspecified back pain laterality, unspecified chronicity  Muscle weakness (generalized)  Other abnormalities of gait and mobility  Muscle spasm of back     Problem List Patient Active Problem List   Diagnosis Date Noted  . 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 Intellectual disability 10/12/2017  . Umbilical hernia without obstruction and without gangrene 08/15/2017  . Chest pain 11/05/2012  . Atrial septal defect 09/19/2011    Beacher MayBrooks, Marie PT 12/12/2017, 3:09 PM  [place] [facility] [address] [place], [place], [address] Phone: [phone]   Fax:  [phone]  Name: Dawn Lambert MRN: [id number] Date of Birth: [birth year]

## 2017-12-17 ENCOUNTER — Ambulatory Visit: Payer: Medicaid Other

## 2017-12-19 ENCOUNTER — Ambulatory Visit: Payer: Medicaid Other

## 2017-12-23 ENCOUNTER — Emergency Department
Admission: EM | Admit: 2017-12-23 | Discharge: 2017-12-23 | Disposition: A | Discharge status: Home / Self Care | Payer: Medicaid Other | Attending: Emergency Medicine | Admitting: Emergency Medicine

## 2017-12-23 ENCOUNTER — Encounter: Payer: Self-pay | Admitting: Emergency Medicine

## 2017-12-23 ENCOUNTER — Other Ambulatory Visit: Payer: Self-pay

## 2017-12-23 DIAGNOSIS — R45850 Homicidal ideations: Secondary | ICD-10-CM | POA: Diagnosis not present

## 2017-12-23 DIAGNOSIS — Z7984 Long term (current) use of oral hypoglycemic drugs: Secondary | ICD-10-CM | POA: Insufficient documentation

## 2017-12-23 DIAGNOSIS — Z79899 Other long term (current) drug therapy: Secondary | ICD-10-CM | POA: Insufficient documentation

## 2017-12-23 DIAGNOSIS — I10 Essential (primary) hypertension: Secondary | ICD-10-CM | POA: Diagnosis not present

## 2017-12-23 DIAGNOSIS — Z9189 Other specified personal risk factors, not elsewhere classified: Secondary | ICD-10-CM

## 2017-12-23 DIAGNOSIS — F39 Unspecified mood [affective] disorder: Secondary | ICD-10-CM | POA: Diagnosis not present

## 2017-12-23 DIAGNOSIS — E119 Type 2 diabetes mellitus without complications: Secondary | ICD-10-CM | POA: Diagnosis not present

## 2017-12-23 DIAGNOSIS — E039 Hypothyroidism, unspecified: Secondary | ICD-10-CM | POA: Insufficient documentation

## 2017-12-23 DIAGNOSIS — Z046 Encounter for general psychiatric examination, requested by authority: Secondary | ICD-10-CM | POA: Diagnosis present

## 2017-12-23 LAB — COMPREHENSIVE METABOLIC PANEL
ALT: 21 U/L (ref 0–44)
AST: 26 U/L (ref 15–41)
Albumin: 3.9 g/dL (ref 3.5–5.0)
Alkaline Phosphatase: 79 U/L (ref 38–126)
Anion gap: 8 (ref 5–15)
BUN: 10 mg/dL (ref 6–20)
CO2: 24 mmol/L (ref 22–32)
Calcium: 8.9 mg/dL (ref 8.9–10.3)
Chloride: 107 mmol/L (ref 98–111)
Creatinine, Ser: 0.66 mg/dL (ref 0.44–1.00)
GFR calc Af Amer: >60 mL/min (ref >60)
GFR calc non Af Amer: >60 mL/min (ref >60)
Glucose, Bld: 161 mg/dL — ABNORMAL HIGH (ref 70–99)
Potassium: 3.7 mmol/L (ref 3.5–5.1)
Sodium: 139 mmol/L (ref 135–145)
Total Bilirubin: 0.1 mg/dL — ABNORMAL LOW (ref 0.3–1.2)
Total Protein: 7.8 g/dL (ref 6.5–8.1)

## 2017-12-23 LAB — SALICYLATE LEVEL: Salicylate Lvl: <7 mg/dL (ref 2.8–30.0)

## 2017-12-23 LAB — ETHANOL: Alcohol, Ethyl (B): <10 mg/dL (ref <10)

## 2017-12-23 LAB — CBC
HCT: 37.1 % (ref 35.0–47.0)
Hemoglobin: 12.3 g/dL (ref 12.0–16.0)
MCH: 28.7 pg (ref 26.0–34.0)
MCHC: 33.2 g/dL (ref 32.0–36.0)
MCV: 86.6 fL (ref 80.0–100.0)
Platelets: 337 10*3/uL (ref 150–440)
RBC: 4.28 MIL/uL (ref 3.80–5.20)
RDW: 13.7 % (ref 11.5–14.5)
WBC: 13.2 10*3/uL — ABNORMAL HIGH (ref 3.6–11.0)

## 2017-12-23 LAB — ACETAMINOPHEN LEVEL: Acetaminophen (Tylenol), Serum: <10 ug/mL — ABNORMAL LOW (ref 10–30)

## 2017-12-23 MED ORDER — ACETAMINOPHEN 325 MG PO TABS: 650.0000 mg | ORAL_TABLET | Freq: Four times a day (QID) | ORAL | 0 refills | Status: AC | PRN

## 2017-12-23 NOTE — ED Notes (Signed)
Attempted to get in contact with [facility] to inform that patient has been discharged. No answer, and no way to leave a voice message, mailbox full.
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
## 2017-12-23 NOTE — ED Notes (Signed)
Patient talking to SOC 

## 2017-12-23 NOTE — ED Provider Notes (Signed)
[facility].  Patient is clinically stable.  Not felt to be a danger to self or others.  No SI or Hi.  No indication for inpatient psychiatric admission at this time.  Appropriate for continued outpatient therapy.    Willy Eddyobinson, Mamoudou Mulvehill, MD 12/23/17 [phone]

## 2017-12-23 NOTE — Discharge Instructions (Addendum)
FOLLOW UP WITH pcp

## 2017-12-23 NOTE — ED Notes (Signed)
1 pair of tennis shoes, 1 dress, 1 pair underwear, 1 bra.

## 2017-12-23 NOTE — ED Notes (Signed)
Spoke with patient mother legal guardian Cristela BlueRenee Butrum and let her know patient will be discharged to group home
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
## 2017-12-23 NOTE — ED Notes (Signed)
Spoke with Dawn Lambert from [facility] and she stated that staff is on their way to get her

## 2017-12-24 ENCOUNTER — Ambulatory Visit: Payer: Medicaid Other

## 2017-12-24 DIAGNOSIS — M5440 Lumbago with sciatica, unspecified side: Secondary | ICD-10-CM | POA: Diagnosis not present

## 2017-12-24 DIAGNOSIS — M62830 Muscle spasm of back: Secondary | ICD-10-CM

## 2017-12-24 DIAGNOSIS — M6281 Muscle weakness (generalized): Secondary | ICD-10-CM

## 2017-12-24 NOTE — Patient Instructions (Signed)
Access Code: [email or web address]  URL: [email or web address]  Date: 12/24/2017  Prepared by: Olga Coasteriana Jiayi Lengacher   Exercises  . Supine Lower Trunk Rotation - 10 reps - 3 sets - 2x daily - 7x weekly  . Prone Press Up - 10 reps - 2 sets - 2x daily - 7x weekly  . Lying Prone - 2x daily - 7x weekly  . Supine Bridge - 10 reps - 2 sets - 1x daily - 7x weekly  . Clamshell - 10 reps - 2 sets - 1x daily - 7x weekly  . Hooklying Clamshell with Resistance - 10 reps - 2 sets - 1x daily - 7x weekly  . Hooklying Transversus Abdominis Palpation - 10 reps - 2 sets - 1x daily - 7x weekly  . Sit to Stand - 10 reps - 2 sets - 1x daily - 7x weekly

## 2017-12-24 NOTE — Therapy (Signed)
[facility] PHYSICAL AND SPORTS MEDICINE [address] [place], [place], [address] Phone: [phone]   Fax:  [phone]  Physical Therapy Treatment  Patient Details  Name: Dawn Lambert MRN: [id number] Date of Birth: [birth year] Referring Provider: Fulton MoleBurns, Harriett MD   Encounter Date: 12/24/2017  PT End of Session - 12/24/17 0919    Visit Number  3    Number of Visits  10    Date for PT Re-Evaluation  01/30/18    Authorization Type  2/3 [employer] (8/14 -9/13)    PT Start Time  0800    PT Stop Time  0845    PT Time Calculation (min)  45 min    Activity Tolerance  Patient tolerated treatment well    Behavior During Therapy  [facility] for tasks assessed/performed       Past Medical History:  Diagnosis Date  . Anxiety   . Arnold-Chiari malformation (HCC)   . Atrial septal defect   . Complication of anesthesia    when pt was 11, woke up during surgery (muscle biopsy)  . Constipation   . Contraception management   . Dandruff   . Dermatomyositis (HCC)    age 89  . Diabetes mellitus without complication (HCC)   . Elevated blood pressure   . Hypertension   . Hypothyroidism   . Mental retardation   . Mood disorder (HCC)   . Overweight(278.02)   . Pelvic pain   . Prediabetes     Past Surgical History:  Procedure Laterality Date  . ASD REPAIR     at age 89  . UMBILICAL HERNIA REPAIR N/A 09/17/2017   Procedure: HERNIA REPAIR UMBILICAL ADULT;  Surgeon: [name], [name], MD;  Location: [facility] ORS;  Service: General;  Laterality: N/A;    There were no vitals filed for this visit.  Subjective Assessment - 12/24/17 0802    Subjective  Patient reports that she's been laying around the last couple of days. 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     Patient is accompained by:  --   caregiver   Pertinent History  --    Limitations  Walking;Sitting;Standing    Patient Stated Goals  Patient would  like to return to previous activities (walking, swimming)    Pain Score  5     Pain Location  Back    Pain Orientation  Lower    Pain Descriptors / Indicators  Aching;Tender    Pain Type  Chronic pain    Pain Radiating Towards  b/l    Pain Onset  More than a month ago       Objective:  Patient able to demonstrate proper body mechanics with supine to sit transfer, and sit to stand transfer Palpation: mild spasms lumbar spine paraspinal muscles b/l with hypersensitivity    Treatment:  Manual Therapy: 15mins Prone: STM to b/l posterior hip/back musculature (glutes, lumbar fascia, lumbar paraspinals) to improve soft tissue integrity x 15mins   Therapeutic Exercises: Performed with PT multimodal cues, goal; strengthen core/hip muscles, improve mobility  Lower trunk rotation 7x5sec holds with tactile cues throughout exercises for tissue elasticity/relaxation Supine hip abduction with RTB 2x10 with pause at end range, tactile/verbal cues for proper technique TRA activation in supine for core strengthening x15 multimodal cues for proper core activation Walking program added to HEP, instructed to perform daily 10-2915mins Sit to stand with Tra activation with demonstration from PT  2x10, intermittent verbal cues for core activation Seated HS stretch with demonstration 2x20sec, visual cues Standing hip flexor stretch 2x20sec with demonstration, tactile cues, 1 UE supported Bridges with verbal cues 2x10 clamshells with verbal cues 2x10 b/l  Patient response to treatment: patient demonstrated improved technique with exercises with significant tactile and VC for correct alignment. Patient with decreased pain at end of session.     PT Education - 12/24/17 0916    Education provided  Yes    Education Details  HEP , body mechanics    Person(s) Educated  Patient;Caregiver(s)    Methods  Explanation;Demonstration;Handout;Verbal cues;Tactile cues    Comprehension  Verbalized understanding;Returned  demonstration       PT Short Term Goals - 12/05/17 1330      PT SHORT TERM GOAL #1   Title  Patient will demonstrate proper posture, body mechanics for bed mobility, daily functional tasks to assist with pain control in back and LE's    Baseline  Patient requires guidance, instruction and has limited knowledge of appropriate posture, body mechanics for daily activities    Status  New    Target Date  12/26/17      PT SHORT TERM GOAL #2   Title  Patient will demonstrate improved self perceived impairment with daily tasks with decreased pain in back and LE's as indicated by MODI improvement by 12% or more    Baseline  to be assessed next session    Status  New    Target Date  01/02/18        PT Long Term Goals - 12/05/17 1337      PT LONG TERM GOAL #1   Title  Patient will demonstrate improved self perceived impairment with daily tasks with decreased pain in back and LE's as indicated by MODI improvement to less than 20%    Baseline  to be assessed next session    Status  New    Target Date  01/30/18      PT LONG TERM GOAL #2   Title  Patient will report max pain level of 3/10 in back and LE's with aggravating activities to allow improved function with daily tasks     Baseline  max pain level 6/10 with aggravating activities    Status  New    Target Date  01/30/18      PT LONG TERM GOAL #3   Title  Patient will be independent with home program for proper body mechanics, exercises for strength and flexibility to allow transition to self management when discharged from pysical therpay    Baseline  patient has limited to no knowledge of appropriate exercises or progression without cuing, instruction     Status  New    Target Date  01/30/18            Plan - 12/24/17 0916    Clinical Impression Statement  Patient reported decrease in pain at end of session. Importance of HEP compliance discussed, and HEP updated. Patient needed multimodal cues for new exercises to perform  properly.  Patient still demonstrates limitations in soft tissue and decrease core/hip strength and would benefit from further skilled PT to continue to address deficits.     Rehab Potential  Good    Clinical Impairments Affecting Rehab Potential  HTN; DM; previous history of back pain; multiple co morbidities    PT Frequency  --   1-2x a week   PT Duration  8 weeks    PT Treatment/Interventions  Patient/family education;Therapeutic activities;Therapeutic exercise;Aquatic Therapy;Cryotherapy;Electrical Stimulation;Iontophoresis 4mg /ml Dexamethasone;Moist Heat;Traction;Ultrasound;Balance training;Functional mobility training;Gait training;Neuromuscular re-education;Dry needling;Passive range of motion;Manual techniques;Energy conservation    PT Next Visit Plan  FOTO, MODI,  progress exercises, stretches     PT Home Exercise Plan  body mechanics, clamshells, adduction/glute isometrics with ball, prone lying, prone on elbows    Consulted and Agree with Plan of Care  Patient;Family member/caregiver    Family Member Consulted  Caregiver       Patient will benefit from skilled therapeutic intervention in order to improve the following deficits and impairments:  Pain, Improper body mechanics, Postural dysfunction, Difficulty walking, Decreased strength, Hypomobility, Decreased endurance, Decreased range of motion, Impaired flexibility, Impaired perceived functional ability, Decreased activity tolerance  Visit Diagnosis: Low back pain with sciatica, sciatica laterality unspecified, unspecified back pain laterality, unspecified chronicity  Muscle weakness (generalized)  Muscle spasm of back     Problem List Patient Active Problem List   Diagnosis Date Noted  . 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 Intellectual disability 10/12/2017  . Umbilical hernia without obstruction and without gangrene 08/15/2017  . Chest pain 11/05/2012  . Atrial septal defect  09/19/2011    Olga Coaster PT, DPT 9:51 AM,12/24/17 [phone]  [facility] PHYSICAL AND SPORTS MEDICINE [address] [name], [address] Phone: [phone]   Fax:  [phone]  Name: Dawn Lambert MRN: [id number] Date of Birth: [birth year]

## 2017-12-26 ENCOUNTER — Ambulatory Visit: Payer: Medicaid Other

## 2017-12-26 DIAGNOSIS — M5440 Lumbago with sciatica, unspecified side: Secondary | ICD-10-CM | POA: Diagnosis not present

## 2017-12-26 DIAGNOSIS — M6281 Muscle weakness (generalized): Secondary | ICD-10-CM

## 2017-12-26 DIAGNOSIS — M545 Low back pain: Secondary | ICD-10-CM

## 2017-12-26 DIAGNOSIS — R2689 Other abnormalities of gait and mobility: Secondary | ICD-10-CM

## 2017-12-26 NOTE — Therapy (Signed)
[facility] [address] [place], [place], [address] Phone: [phone]   Fax:  [phone]  Physical Therapy Treatment  Patient Details  Name: Dawn Lambert MRN: [id number] Date of Birth: 01/08/1991 Referring Provider: Fulton MoleBurns, Harriett MD   Encounter Date: 12/26/2017  PT End of Session - 12/26/17 0946    Visit Number  4    Number of Visits  10    Date for PT Re-Evaluation  01/30/18    Authorization Type  3/3 mcaid    PT Start Time  0930    PT Stop Time  1015    PT Time Calculation (min)  45 min    Activity Tolerance  Patient tolerated treatment well    Behavior During Therapy  [facility] for tasks assessed/performed       Past Medical History:  Diagnosis Date  . Anxiety   . Arnold-Chiari malformation (HCC)   . Atrial septal defect   . Complication of anesthesia    when pt was 11, woke up during surgery (muscle biopsy)  . Constipation   . Contraception management   . Dandruff   . 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 Diabetes mellitus without complication (HCC)   . Elevated blood pressure   . Hypertension   . Hypothyroidism   . Mental retardation   . Mood disorder (HCC)   . Overweight(278.02)   . Pelvic pain   . Prediabetes     Past Surgical History:  Procedure Laterality Date  . ASD REPAIR     at age 89  . UMBILICAL HERNIA REPAIR N/A 09/17/2017   Procedure: HERNIA REPAIR UMBILICAL ADULT;  Surgeon: [name], [name], MD;  Location: [facility] ORS;  Service: General;  Laterality: N/A;    There were no vitals filed for this visit.  Subjective Assessment - 12/26/17 0935    Subjective  Patient reports that she has been walking more since her last visit. Complains of increased pain after PT last session the next day. States she was only able to dance with her arms and not with her legs.     Patient is accompained by:  --   caregiver   Pertinent History  Patient complains of generalized pain. States her main  complaint is her mid back and b/l legs. Reports her pain radiates to b/l posterior calves in multiple positions, unable to identify what activities are the most aggravating. Case worker states that the patient complains a lot with prolonged walking, including leg cramps. States her back started in June 2019, reports a hernia repair 09/17/17. Since onset her pain has not worsened.  Medication (Tylenol) allievates symptoms. Patient also reports a decrease in physical activity since onset of pain.     Limitations  Walking;Sitting;Standing    Patient Stated Goals  Patient would like to return to previous activities (walking, swimming)    Currently in Pain?  Yes    Pain Score  4    reports 6-7 with walking   Pain Location  Generalized   back, legs   Pain Orientation  Lower    Pain Descriptors / Indicators  Aching    Pain Radiating Towards  b/l     Pain Onset  More than a month ago    Aggravating Factors   depends on day and activity; reports all positions will aggravate symptoms    Pain Relieving Factors  medication, rest    Effect of Pain on Daily Activities  impedes patient's  ability to perform activities       Objective: Lumbar flexion: 80% Extension: 50% SB: 70% b/l Rotation: 70% b/l. Complaints of pain with extension.  MMT of LE (limited due to patient unable to follow commands consistently) Hip flexion  R:4-/5 L:3+/5 Hip abduction: 3/5 on LLE, 4/5 on RLE Knee extension: 4-/5 B/L Knee flexion R:4/5 L:4-/5   Therapeutic exercise: Patient performed with instruction, verbal cues, tactile cues of therapist: goal: HS strech b/l seated with verbal cues/demo from PT x20sec ea Standing hip flexor stretch with verbal/demo from PT x20sec ea Standing calf stretch with verbal cues/demo from PT x20sec ea Supine LTR with cues to stay in painfree range 5x5sec hold with verbal cues TrA activation in supine with tactile/verbal cues 10x3" Supine hip abduction with RTB with verbal cues x10  Patient  response to treatment: patient demonstrated improved technique with exercises with repeated demonstration and VC. Importance of HEP compliance and proper form reiterated to patient to avoid overworking muscles.        PT Education - 12/26/17 1152    Education provided  Yes    Education Details  HEP, exercises    Person(s) Educated  Patient    Methods  Explanation;Demonstration;Tactile cues;Verbal cues;Handout    Comprehension  Verbalized understanding;Returned demonstration       PT Short Term Goals - 12/26/17 0941      PT SHORT TERM GOAL #1   Title  Patient will demonstrate proper posture, body mechanics for bed mobility, daily functional tasks to assist with pain control in back and LE's    Baseline  Patient needs further guidance, instruction and has limited knowledge of appropriate posture, body mechanics for daily activities    Time  2    Period  Weeks    Status  On-going    Target Date  01/09/18      PT SHORT TERM GOAL #2   Title  Patient will demonstrate improved self perceived impairment with daily tasks with decreased pain in back and LE's as indicated by MODI improvement by 12% or more    Baseline  patient reports that it is getting worse, her pain is worsening with walking. MODI 30%    Time  4    Period  Weeks    Status  On-going    Target Date  01/23/18        PT Long Term Goals - 12/26/17 0943      PT LONG TERM GOAL #1   Title  Patient will demonstrate improved self perceived impairment with daily tasks with decreased pain in back and LE's as indicated by MODI improvement to less than 20%    Baseline  MODI 30%    Time  5    Period  Weeks    Status  On-going    Target Date  01/30/18      PT LONG TERM GOAL #2   Title  Patient will report max pain level of 3/10 in back and LE's with aggravating activities to allow improved function with daily tasks     Baseline  max pain level 6-7/10 with aggravating activities    Time  5    Period  Weeks    Status   On-going    Target Date  01/30/18      PT LONG TERM GOAL #3   Title  Patient will be independent with home program for proper body mechanics, exercises for strength and flexibility to allow transition to self management when discharged from  pysical therpay    Baseline  Final HEP not completed, patient needs further education    Time  5    Period  Weeks    Status  On-going    Target Date  01/30/18      PT LONG TERM GOAL #4   Title  Patient will demonstrate proper body mechanics with mobility and lifting to prevent reinjury.    Time  4    Period  Weeks    Status  New    Target Date  01/23/18            Plan - 12/26/17 0945    Clinical Impression Statement  Patient reports that overall she feels her pain has increased since starting therapy. Does admit that it may be due to muscle soreness, and has attempted some exercises besides HEP. Upon assessment patient demonstrates little change in overall objective measures such as lumbar ROM, LE strength, ability to perform functional activities like standing, walking. Patient has only had 3 treatment PT visits. The patient would benefit from further skilled PT address these limitations to improve functional abilities, return to PLOF. as well as pain relief. Current HEP reviewed, demonstrated, and updated to address patients complaints of increased pain, and educated about importance of HEP compliance to avoid overworking.    History and Personal Factors relevant to plan of care:  Patient complains of generalized pain. States her main complaint is her mid back and b/l legs. Reports hre pain radiates to b/l posterior calves in multiple positions. Unable to identify what activities are the most aggravating. Case worker states that the patient complains a lot with prolonged walking, including leg cramps. States her back started in June 2019, reports a hernia repair 09/17/17. Since onset her pain has not worsened. Medication (Tylenol) allievates symptoms.  Patient also reports a decrease physical activity since onset of pain.     Clinical Presentation  Stable    Clinical Presentation due to:  no change in symptoms from initial onset    Clinical Decision Making  Low    Rehab Potential  Good    Clinical Impairments Affecting Rehab Potential  HTN; DM; previous history of back pain; multiple co morbidities    PT Frequency  2x / week    PT Duration  4 weeks    PT Treatment/Interventions  Patient/family education;Therapeutic activities;Therapeutic exercise;Aquatic Therapy;Cryotherapy;Electrical Stimulation;Iontophoresis 4mg /ml Dexamethasone;Moist Heat;Traction;Ultrasound;Balance training;Functional mobility training;Gait training;Neuromuscular re-education;Dry needling;Passive range of motion;Manual techniques;Energy conservation    PT Next Visit Plan  FOTO, MODI,  progress exercises, stretches     PT Home Exercise Plan  body mechanics, clamshells, adduction/glute isometrics with ball, prone lying, prone on elbows    Consulted and Agree with Plan of Care  Patient;Family member/caregiver    Family Member Consulted  Caregiver       Patient will benefit from skilled therapeutic intervention in order to improve the following deficits and impairments:  Pain, Improper body mechanics, Postural dysfunction, Difficulty walking, Decreased strength, Hypomobility, Decreased endurance, Decreased range of motion, Impaired flexibility, Impaired perceived functional ability, Decreased activity tolerance  Visit Diagnosis: Low back pain with sciatica, sciatica laterality unspecified, unspecified back pain laterality, unspecified chronicity  Muscle weakness (generalized)  Other abnormalities of gait and mobility  Bilateral low back pain, unspecified chronicity, with sciatica presence unspecified     Problem List Patient Active Problem List   Diagnosis Date Noted  . 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 Intellectual  disability 10/12/2017  .  Umbilical hernia without obstruction and without gangrene 08/15/2017  . Chest pain 11/05/2012  . Atrial septal defect 09/19/2011    Olga Coaster PT, DPT 12:14 PM,12/26/17 [phone]  [facility] PHYSICAL AND SPORTS MEDICINE [address] [name], [address] Phone: [phone]   Fax:  [phone]  Name: Dawn Lambert MRN: [id number] Date of Birth: 17-Oct-1990

## 2017-12-26 NOTE — Patient Instructions (Signed)
HEP updated, including hip flexor stretch, HS stretch, calf stretch b/l, removal of bridges and clamshells due to increased pain reported.

## 2018-01-15 ENCOUNTER — Ambulatory Visit: Payer: Medicaid Other | Attending: Internal Medicine

## 2018-01-15 DIAGNOSIS — M6281 Muscle weakness (generalized): Secondary | ICD-10-CM | POA: Insufficient documentation

## 2018-01-15 DIAGNOSIS — M62830 Muscle spasm of back: Secondary | ICD-10-CM | POA: Insufficient documentation

## 2018-01-15 DIAGNOSIS — M5440 Lumbago with sciatica, unspecified side: Secondary | ICD-10-CM | POA: Insufficient documentation

## 2018-01-15 DIAGNOSIS — M545 Low back pain: Secondary | ICD-10-CM | POA: Insufficient documentation

## 2018-01-15 DIAGNOSIS — R2689 Other abnormalities of gait and mobility: Secondary | ICD-10-CM | POA: Insufficient documentation

## 2018-01-21 ENCOUNTER — Telehealth: Payer: Self-pay

## 2018-01-21 ENCOUNTER — Ambulatory Visit: Payer: Medicaid Other

## 2018-01-21 NOTE — Telephone Encounter (Signed)
PT left a message for patient about two no show appointments. Informed that if the patient did not attend the next appointment she would be discharged from physical therapy.

## 2018-01-23 ENCOUNTER — Ambulatory Visit: Payer: Medicaid Other

## 2018-01-23 DIAGNOSIS — R2689 Other abnormalities of gait and mobility: Secondary | ICD-10-CM | POA: Diagnosis present

## 2018-01-23 DIAGNOSIS — M5440 Lumbago with sciatica, unspecified side: Secondary | ICD-10-CM | POA: Diagnosis present

## 2018-01-23 DIAGNOSIS — M545 Low back pain: Secondary | ICD-10-CM | POA: Diagnosis present

## 2018-01-23 DIAGNOSIS — M62830 Muscle spasm of back: Secondary | ICD-10-CM | POA: Diagnosis present

## 2018-01-23 DIAGNOSIS — M6281 Muscle weakness (generalized): Secondary | ICD-10-CM

## 2018-01-23 NOTE — Therapy (Signed)
[place] [facility] [address], [name], [address] Phone: [phone]   Fax:  [phone]  Physical Therapy Treatment  Patient Details  Name: Dawn Lambert MRN: [id number] Date of Birth: 07-18-1990 Referring Provider: Fulton Mole   Encounter Date: 01/23/2018  PT End of Session - 01/23/18 1025    Visit Number  5    Number of Visits  10    Date for PT Re-Evaluation  02/11/18    PT Start Time  1027    PT Stop Time  1112    PT Time Calculation (min)  45 min    Activity Tolerance  Patient tolerated treatment well    Behavior During Therapy  [facility] for tasks assessed/performed       Past Medical History:  Diagnosis Date  . Anxiety   . Arnold-Chiari malformation (HCC)   . Atrial septal defect   . Complication of anesthesia    when pt was 11, woke up during surgery (muscle biopsy)  . Constipation   . Contraception management   . Dandruff   . Dermatomyositis (HCC)    age 27  . Diabetes mellitus without complication (HCC)   . Elevated blood pressure   . Hypertension   . Hypothyroidism   . Mental retardation   . Mood disorder (HCC)   . Overweight(278.02)   . Pelvic pain   . Prediabetes     Past Surgical History:  Procedure Laterality Date  . ASD REPAIR     at age 1  . UMBILICAL HERNIA REPAIR N/A 09/17/2017   Procedure: HERNIA REPAIR UMBILICAL ADULT;  Surgeon: Earline Mayotte, MD;  Location: [facility] ORS;  Service: General;  Laterality: N/A;    There were no vitals filed for this visit.  Subjective Assessment - 01/23/18 1119    Subjective  Patient reports that her pain is high today and is located more in the central of her back. States her very low back does not hurt.    Currently in Pain?  Yes    Pain Score  7     Pain Location  Back    Pain Orientation  Mid;Medial    Pain Descriptors / Indicators  Tightness    Pain Onset  More than a month ago       Objective: hypomobility of T8-L2,  hypertonic thoracic/lumbar paraspinals and TTP.  Therapeutic exercise:Patient performed with instruction, verbal cues, tactile cues of therapist: goal: improve ROM, mobility, decrease muscle tension  Supine Lower Trunk Rotation - 10 reps x5s  Prone Press Up - 10 reps Lying Prone x63mins during STM Hooklying Transversus Abdominis Palpation - 15 x 3s Sidelying Thoracic Rotation with Open Book - 6 reps - 1 sets - 5 hold b/l Seated Hamstring Stretch with Chair - 3 reps - 1 sets - 30 hold  Supine Piriformis Stretch with Foot on Ground - 10 reps - 3 sets - 30 hold  Seated thoracic extension x15 with demo from PT  PT and patient reviewed body mechanics for proper lifting and bending x to prevent reinjury. Further instruction needed. HEP updated and reviewed with patient as well. Patient taught about self mobilization of soft tissue at home with tennis ball.  Manual therapy: STM to b/l thoracic and lumbar parspinals with deep techniques and myofascial release, CPA grade III/IV to T8-L2 30sec bouts x 2 each level, improvement in mobility noted as well as improvement reported by patient.   Patient response to treatment: patient demonstrated  improved technique with exercises with repeated demonstration and VC. Patient reports decrease in pain at end of session from start.    PT Education - 01/23/18 1119    Education provided  Yes    Education Details  body mechanics, exercise technique/form, HEP    Person(s) Educated  Patient    Methods  Explanation    Comprehension  Verbalized understanding;Returned demonstration;Need further instruction       PT Short Term Goals - 01/23/18 1120      PT SHORT TERM GOAL #1   Title  Patient will demonstrate proper posture, body mechanics for bed mobility, daily functional tasks to assist with pain control in back and LE's    Baseline  Patient needs further guidance, instruction and has limited knowledge of appropriate posture, body mechanics for  daily activities    Time  2    Period  Weeks    Status  On-going    Target Date  02/07/18      PT SHORT TERM GOAL #2   Title  Patient will demonstrate improved self perceived impairment with daily tasks with decreased pain in back and LE's as indicated by MODI improvement by 12% or more    Baseline  patient reports that it is getting worse, her pain is worsening with walking. MODI 30%    Time  2    Period  Weeks    Status  On-going    Target Date  02/07/18        PT Long Term Goals - 01/23/18 1122      PT LONG TERM GOAL #1   Title  Patient will demonstrate improved self perceived impairment with daily tasks with decreased pain in back and LE's as indicated by MODI improvement to less than 20%    Baseline  MODI 30%. Patient has not attended several PT visits, goals extended    Time  2    Period  Weeks    Status  On-going    Target Date  02/07/18      PT LONG TERM GOAL #2   Title  Patient will report max pain level of 3/10 in back and LE's with aggravating activities to allow improved function with daily tasks     Baseline  max pain level 6-7/10 with aggravating activities : Patient has not attended several PT visits, goals extended    Time  2    Period  Weeks    Status  On-going    Target Date  02/07/18      PT LONG TERM GOAL #3   Title  Patient will be independent with home program for proper body mechanics, exercises for strength and flexibility to allow transition to self management when discharged from pysical therpay    Baseline  Final HEP not completed, patient needs further education    Time  2    Period  Weeks    Status  On-going    Target Date  02/07/18      PT LONG TERM GOAL #4   Title  Patient will demonstrate proper body mechanics with mobility and lifting to prevent reinjury.    Baseline  Patient needs further instruction and practice to ensure carry over into daily activities.    Time  2    Period  Weeks    Status  On-going    Target Date  02/07/18             Plan - 01/23/18 1124    Clinical Impression  Statement  Patient reported decrease in pain after manual therapy, and had min to no complaints with body mechanics education. Patient needs extensive cueing for pacing of activities and proper form for body mechanics and would benefit from further education to ensure carry over to daily activities.     PT Treatment/Interventions  Patient/family education;Therapeutic activities;Therapeutic exercise;Aquatic Therapy;Cryotherapy;Electrical Stimulation;Iontophoresis 4mg /ml Dexamethasone;Moist Heat;Traction;Ultrasound;Balance training;Functional mobility training;Gait training;Neuromuscular re-education;Dry needling;Passive range of motion;Manual techniques;Energy conservation    PT Home Exercise Plan  body mechanics, LE strength, thoracic/lumbar mobility    Consulted and Agree with Plan of Care  Patient       Patient will benefit from skilled therapeutic intervention in order to improve the following deficits and impairments:  Pain, Improper body mechanics, Postural dysfunction, Difficulty walking, Decreased strength, Hypomobility, Decreased endurance, Decreased range of motion, Impaired flexibility, Impaired perceived functional ability, Decreased activity tolerance  Visit Diagnosis: Low back pain with sciatica, sciatica laterality unspecified, unspecified back pain laterality, unspecified chronicity  Muscle weakness (generalized)  Other abnormalities of gait and mobility  Bilateral low back pain, unspecified chronicity, with sciatica presence unspecified  Muscle spasm of back     Problem List Patient Active Problem List   Diagnosis Date Noted  . 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 Intellectual disability 10/12/2017  . Umbilical hernia without obstruction and without gangrene 08/15/2017  . Chest pain 11/05/2012  . Atrial septal defect 09/19/2011   Olga Coaster PT, DPT 11:27  AM,01/23/18 [phone]  [place] [facility] PH[facility] AND SPORTS MEDICINE [address] [name], [address] Phone: [phone]   Fax:  [phone]  Name: Dawn Lambert MRN: [id number] Date of Birth: 23-Oct-1990

## 2018-01-23 NOTE — Patient Instructions (Signed)
Access Code: [email or web address]  URL: [email or web address]  Date: 01/23/2018  Prepared by: Olga Coaster   Exercises  Supine Lower Trunk Rotation - 10 reps - 3 sets - 2x daily - 7x weekly  Prone Press Up - 10 reps - 2 sets - 2x daily - 7x weekly  Lying Prone - 1x daily - 7x weekly  Hooklying Transversus Abdominis Palpation - 10 reps - 2 sets - 1x daily - 7x weekly  Sidelying Thoracic Rotation with Open Book - 5 reps - 1 sets - 5 hold - 1x daily - 7x weekly  Seated Hamstring Stretch with Chair - 3 reps - 1 sets - 30 hold - 1x daily - 7x weekly  Supine Piriformis Stretch with Foot on Ground - 10 reps - 3 sets - 30 hold - 1x daily - 7x weekly  Supine Bridge - 10 reps - 1 sets - 1x daily - 3x weekly  Hooklying Clamshell with Resistance - 10 reps - 1 sets - 1x daily - 3x weekly

## 2018-01-28 ENCOUNTER — Ambulatory Visit: Payer: Medicaid Other

## 2018-01-28 DIAGNOSIS — M545 Low back pain: Secondary | ICD-10-CM

## 2018-01-28 DIAGNOSIS — M5440 Lumbago with sciatica, unspecified side: Secondary | ICD-10-CM | POA: Diagnosis not present

## 2018-01-28 DIAGNOSIS — M6281 Muscle weakness (generalized): Secondary | ICD-10-CM

## 2018-01-28 DIAGNOSIS — R2689 Other abnormalities of gait and mobility: Secondary | ICD-10-CM

## 2018-01-28 DIAGNOSIS — M62830 Muscle spasm of back: Secondary | ICD-10-CM

## 2018-01-28 NOTE — Therapy (Signed)
[facility] [address] [name], [address] Phone: [phone]   Fax:  [phone]  Physical Therapy Treatment  Patient Details  Name: Dawn Lambert MRN: [id number] Date of Birth: April 24, 1991 Referring Provider (PT): Fulton Mole   Encounter Date: 01/28/2018  PT End of Session - 01/28/18 1028    Visit Number  6    Number of Visits  10    Date for PT Re-Evaluation  02/11/18    Authorization Type  2/8 medicaid    PT Start Time  1031    PT Stop Time  1107    PT Time Calculation (min)  36 min    Activity Tolerance  Patient tolerated treatment well    Behavior During Therapy  [facility] for tasks assessed/performed       Past Medical History:  Diagnosis Date  . Anxiety   . Arnold-Chiari malformation (HCC)   . Atrial septal defect   . Complication of anesthesia    when pt was 11, woke up during surgery (muscle biopsy)  . Constipation   . Contraception management   . Dandruff   . Dermatomyositis (HCC)    age 27  . Diabetes mellitus without complication (HCC)   . Elevated blood pressure   . Hypertension   . Hypothyroidism   . Mental retardation   . Mood disorder (HCC)   . Overweight(278.02)   . Pelvic pain   . Prediabetes     Past Surgical History:  Procedure Laterality Date  . ASD REPAIR     at age 27  . UMBILICAL HERNIA REPAIR N/A 09/17/2017   Procedure: HERNIA REPAIR UMBILICAL ADULT;  Surgeon: Earline Mayotte, MD;  Location: [facility] ORS;  Service: General;  Laterality: N/A;    There were no vitals filed for this visit.  Subjective Assessment - 01/28/18 1056    Subjective  Patient reports that she does not have any back pain today, just a HA. Has not had any back pain since last PT session. Intermittent b/l LE pain over the weekend reported by patient.     Pertinent History  Patient complains of generalized pain. States her main complaint is her mid back and b/l legs. Reports her pain radiates  to b/l posterior calves in multiple positions, unable to identify what activities are the most aggravating. Case worker states that the patient complains a lot with prolonged walking, including leg cramps. States her back started in June 2019, reports a hernia repair 09/17/17. Since onset her pain has not worsened.  Medication (Tylenol) allievates symptoms. Patient also reports a decrease in physical activity since onset of pain.     Currently in Pain?  No/denies        Therapeutic exercise:Patient performed with instruction, verbal cues, tactile cues of therapist: goal: improve ROM, mobility, decrease muscle tension  Supine Lower Trunk Rotation - 10 reps x5s  Prone Press Up - 2 x 10 reps Hooklying Transversus Abdominis Palpation - 15 x 3s Sidelying Thoracic Rotation with Open Book - 10 reps - 1 sets - 3 hold b/l Seated Hamstring Stretch with Chair - 3 reps - 1 sets - 30 hold  Supine Piriformis Stretch with Foot on Ground - 10 reps - 3 sets - 30 hold  Seated thoracic extension x15 with demo from PT Bridges x10 verbal cues Clamshells x10 b/l verbal/tactile cues  PT and patient reviewed body mechanics for proper lifting and bending x to prevent reinjury. Lifted small yellow  cone, empty box, box with 5# all x10, lifted box with 10# x5 all from floor level. No complaints of pain during activity.  Manual therapy: x3mins STM to L glute, deep and superficial techniques utilized. improvement in tenderness and tissue integrity by 50%   Patient response to treatment:patient demonstrated improved technique with exercises with repeated demonstration and VC. Less cues needed for proper body mechanics while bending/lifting, though patient would still benefit for continued instruction.      PT Education - 01/28/18 1027    Education provided  Yes    Education Details  Estate manager/land agent, exercise technique/form    Person(s) Educated  Patient    Methods  Explanation    Comprehension   Verbalized understanding;Returned demonstration       PT Short Term Goals - 01/23/18 1120      PT SHORT TERM GOAL #1   Title  Patient will demonstrate proper posture, body mechanics for bed mobility, daily functional tasks to assist with pain control in back and LE's    Baseline  Patient needs further guidance, instruction and has limited knowledge of appropriate posture, body mechanics for daily activities    Time  2    Period  Weeks    Status  On-going    Target Date  02/07/18      PT SHORT TERM GOAL #2   Title  Patient will demonstrate improved self perceived impairment with daily tasks with decreased pain in back and LE's as indicated by MODI improvement by 12% or more    Baseline  patient reports that it is getting worse, her pain is worsening with walking. MODI 30%    Time  2    Period  Weeks    Status  On-going    Target Date  02/07/18        PT Long Term Goals - 01/23/18 1122      PT LONG TERM GOAL #1   Title  Patient will demonstrate improved self perceived impairment with daily tasks with decreased pain in back and LE's as indicated by MODI improvement to less than 20%    Baseline  MODI 30%. Patient has not attended several PT visits, goals extended    Time  2    Period  Weeks    Status  On-going    Target Date  02/07/18      PT LONG TERM GOAL #2   Title  Patient will report max pain level of 3/10 in back and LE's with aggravating activities to allow improved function with daily tasks     Baseline  max pain level 6-7/10 with aggravating activities : Patient has not attended several PT visits, goals extended    Time  2    Period  Weeks    Status  On-going    Target Date  02/07/18      PT LONG TERM GOAL #3   Title  Patient will be independent with home program for proper body mechanics, exercises for strength and flexibility to allow transition to self management when discharged from pysical therpay    Baseline  Final HEP not completed, patient needs further  education    Time  2    Period  Weeks    Status  On-going    Target Date  02/07/18      PT LONG TERM GOAL #4   Title  Patient will demonstrate proper body mechanics with mobility and lifting to prevent reinjury.    Baseline  Patient needs  further instruction and practice to ensure carry over into daily activities.    Time  2    Period  Weeks    Status  On-going    Target Date  02/07/18            Plan - 01/28/18 1056    Clinical Impression Statement  Patient had no complaints of pain during session with stretches of strengthening exercises. Body mechanics/technique for bending/lifting improved from last session, less verbal cues needed, though would benefit from further instruction.    PT Frequency  2x / week    PT Duration  4 weeks    PT Treatment/Interventions  Patient/family education;Therapeutic activities;Therapeutic exercise;Aquatic Therapy;Cryotherapy;Electrical Stimulation;Iontophoresis 4mg /ml Dexamethasone;Moist Heat;Traction;Ultrasound;Balance training;Functional mobility training;Gait training;Neuromuscular re-education;Dry needling;Passive range of motion;Manual techniques;Energy conservation    PT Home Exercise Plan  body mechanics, LE strength, thoracic/lumbar mobility    Consulted and Agree with Plan of Care  Patient       Patient will benefit from skilled therapeutic intervention in order to improve the following deficits and impairments:  Pain, Improper body mechanics, Postural dysfunction, Difficulty walking, Decreased strength, Hypomobility, Decreased endurance, Decreased range of motion, Impaired flexibility, Impaired perceived functional ability, Decreased activity tolerance  Visit Diagnosis: Low back pain with sciatica, sciatica laterality unspecified, unspecified back pain laterality, unspecified chronicity  Muscle weakness (generalized)  Other abnormalities of gait and mobility  Bilateral low back pain, unspecified chronicity, with sciatica presence  unspecified  Muscle spasm of back     Problem List Patient Active Problem List   Diagnosis Date Noted  . 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 Intellectual disability 10/12/2017  . Umbilical hernia without obstruction and without gangrene 08/15/2017  . Chest pain 11/05/2012  . Atrial septal defect 09/19/2011    Olga Coaster PT, DPT 11:10 AM,01/28/18 [phone]  [facility] PHYSICAL AND SPORTS MEDICINE [address], [name], [address] Phone: [phone]   Fax:  [phone]  Name: Dawn Lambert MRN: [id number] Date of Birth: 03-16-1991

## 2018-01-30 ENCOUNTER — Ambulatory Visit: Payer: Medicaid Other | Attending: Internal Medicine

## 2018-01-30 DIAGNOSIS — R2689 Other abnormalities of gait and mobility: Secondary | ICD-10-CM | POA: Diagnosis present

## 2018-01-30 DIAGNOSIS — M6281 Muscle weakness (generalized): Secondary | ICD-10-CM | POA: Insufficient documentation

## 2018-01-30 DIAGNOSIS — M546 Pain in thoracic spine: Secondary | ICD-10-CM | POA: Diagnosis present

## 2018-01-30 DIAGNOSIS — M62830 Muscle spasm of back: Secondary | ICD-10-CM | POA: Diagnosis present

## 2018-01-30 DIAGNOSIS — M5440 Lumbago with sciatica, unspecified side: Secondary | ICD-10-CM | POA: Insufficient documentation

## 2018-01-30 NOTE — Therapy (Signed)
[place] [facility] [address], [name], [address] Phone: [phone]   Fax:  [phone]  Physical Therapy Treatment  Patient Details  Name: Dawn Lambert MRN: [id number] Date of Birth: 1991/04/22 Referring Provider (PT): Fulton Mole   Encounter Date: 01/30/2018  PT End of Session - 01/30/18 1111    Visit Number  7    Number of Visits  10    Date for PT Re-Evaluation  02/11/18    Authorization Type  3/8 medicaid    PT Start Time  1105    PT Stop Time  1145    PT Time Calculation (min)  40 min    Activity Tolerance  Patient tolerated treatment well    Behavior During Therapy  [facility] for tasks assessed/performed       Past Medical History:  Diagnosis Date  . Anxiety   . Arnold-Chiari malformation (HCC)   . Atrial septal defect   . Complication of anesthesia    when pt was 11, woke up during surgery (muscle biopsy)  . Constipation   . Contraception management   . Dandruff   . Dermatomyositis (HCC)    age 27  . Diabetes mellitus without complication (HCC)   . Elevated blood pressure   . Hypertension   . Hypothyroidism   . Mental retardation   . Mood disorder (HCC)   . Overweight(278.02)   . Pelvic pain   . Prediabetes     Past Surgical History:  Procedure Laterality Date  . ASD REPAIR     at age 5  . UMBILICAL HERNIA REPAIR N/A 09/17/2017   Procedure: HERNIA REPAIR UMBILICAL ADULT;  Surgeon: Earline Mayotte, MD;  Location: [facility] ORS;  Service: General;  Laterality: N/A;    There were no vitals filed for this visit.  Subjective Assessment - 01/30/18 1105    Subjective  Patient reports that she is a little sore today. She rates her back pain as a 5/10. She denies a headache. She continues to experience bilateral LE pain and pain with walking    Pertinent History  Patient complains of generalized pain. States her main complaint is her mid back and b/l legs. Reports her pain radiates to b/l  posterior calves in multiple positions, unable to identify what activities are the most aggravating. Case worker states that the patient complains a lot with prolonged walking, including leg cramps. States her back started in June 2019, reports a hernia repair 09/17/17. Since onset her pain has not worsened.  Medication (Tylenol) allievates symptoms. Patient also reports a decrease in physical activity since onset of pain.     Currently in Pain?  Yes    Pain Score  5     Pain Location  Back    Pain Orientation  Mid    Pain Descriptors / Indicators  Sore    Pain Type  Chronic pain    Pain Onset  More than a month ago    Pain Frequency  Intermittent          TREATMENT  Ther-ex  NuStep L2 x 5 minutes for warm-up during history; Supine Lower Trunk Rotation 2 x 10; Attempted bridges but discontinued secondaryt o increase in back pain Hooklying marches x 15 each; Hooklying isometric clams with belt 3s hold 2 x 15; Hooklying isometric adductor ball squeeze 3s hold 2 x 15; Supine Piriformis Stretch with Foot on Ground 30s hold x 2 each; L HS stretch x 30s; Sidelying  Thoracic Rotation with Open Book x 10 bilateral; Sidelying SLR hip abduction 2 x 15 bilateral;   Pt educated throughout session about proper posture and technique with exercises. Improved exercise technique, movement at target joints, use of target muscles after min to mod verbal, visual, tactile cues.     Pt continues to progress with strengthening during this session. No reported increase in pain with exercises. Pt encouraged to continue HEP. She will benefit from continued therapy to improve pain and function.                       PT Short Term Goals - 01/23/18 1120      PT SHORT TERM GOAL #1   Title  Patient will demonstrate proper posture, body mechanics for bed mobility, daily functional tasks to assist with pain control in back and LE's    Baseline  Patient needs further guidance, instruction and  has limited knowledge of appropriate posture, body mechanics for daily activities    Time  2    Period  Weeks    Status  On-going    Target Date  02/07/18      PT SHORT TERM GOAL #2   Title  Patient will demonstrate improved self perceived impairment with daily tasks with decreased pain in back and LE's as indicated by MODI improvement by 12% or more    Baseline  patient reports that it is getting worse, her pain is worsening with walking. MODI 30%    Time  2    Period  Weeks    Status  On-going    Target Date  02/07/18        PT Long Term Goals - 01/23/18 1122      PT LONG TERM GOAL #1   Title  Patient will demonstrate improved self perceived impairment with daily tasks with decreased pain in back and LE's as indicated by MODI improvement to less than 20%    Baseline  MODI 30%. Patient has not attended several PT visits, goals extended    Time  2    Period  Weeks    Status  On-going    Target Date  02/07/18      PT LONG TERM GOAL #2   Title  Patient will report max pain level of 3/10 in back and LE's with aggravating activities to allow improved function with daily tasks     Baseline  max pain level 6-7/10 with aggravating activities : Patient has not attended several PT visits, goals extended    Time  2    Period  Weeks    Status  On-going    Target Date  02/07/18      PT LONG TERM GOAL #3   Title  Patient will be independent with home program for proper body mechanics, exercises for strength and flexibility to allow transition to self management when discharged from pysical therpay    Baseline  Final HEP not completed, patient needs further education    Time  2    Period  Weeks    Status  On-going    Target Date  02/07/18      PT LONG TERM GOAL #4   Title  Patient will demonstrate proper body mechanics with mobility and lifting to prevent reinjury.    Baseline  Patient needs further instruction and practice to ensure carry over into daily activities.    Time  2     Period  Weeks  Status  On-going    Target Date  02/07/18            Plan - 01/30/18 1112    Clinical Impression Statement   Pt continues to progress with strengthening during this session. No reported increase in pain with exercises. Pt encouraged to continue HEP. She will benefit from continued therapy to improve pain and function.     PT Frequency  2x / week    PT Duration  4 weeks    PT Treatment/Interventions  Patient/family education;Therapeutic activities;Therapeutic exercise;Aquatic Therapy;Cryotherapy;Electrical Stimulation;Iontophoresis 4mg /ml Dexamethasone;Moist Heat;Traction;Ultrasound;Balance training;Functional mobility training;Gait training;Neuromuscular re-education;Dry needling;Passive range of motion;Manual techniques;Energy conservation    PT Home Exercise Plan  body mechanics, LE strength, thoracic/lumbar mobility    Consulted and Agree with Plan of Care  Patient       Patient will benefit from skilled therapeutic intervention in order to improve the following deficits and impairments:  Pain, Improper body mechanics, Postural dysfunction, Difficulty walking, Decreased strength, Hypomobility, Decreased endurance, Decreased range of motion, Impaired flexibility, Impaired perceived functional ability, Decreased activity tolerance  Visit Diagnosis: Low back pain with sciatica, sciatica laterality unspecified, unspecified back pain laterality, unspecified chronicity  Muscle weakness (generalized)     Problem List Patient Active Problem List   Diagnosis Date Noted  . 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 Intellectual disability 10/12/2017  . Umbilical hernia without obstruction and without gangrene 08/15/2017  . Chest pain 11/05/2012  . Atrial septal defect 09/19/2011   Sharalyn Ink Huprich PT, DPT, GCS  Huprich,Jason 01/31/2018, 11:45 AM  [place] [facility] PHYSICAL AND SPORTS MEDICINE [address] [name], [address] Phone: [phone]   Fax:  [phone]  Name: Dawn Lambert MRN: [id number] Date of Birth: [birth year]

## 2018-02-05 ENCOUNTER — Ambulatory Visit: Payer: Medicaid Other

## 2018-02-05 DIAGNOSIS — M546 Pain in thoracic spine: Secondary | ICD-10-CM

## 2018-02-05 DIAGNOSIS — R2689 Other abnormalities of gait and mobility: Secondary | ICD-10-CM

## 2018-02-05 DIAGNOSIS — M5440 Lumbago with sciatica, unspecified side: Secondary | ICD-10-CM | POA: Diagnosis not present

## 2018-02-05 DIAGNOSIS — M62830 Muscle spasm of back: Secondary | ICD-10-CM

## 2018-02-05 DIAGNOSIS — M6281 Muscle weakness (generalized): Secondary | ICD-10-CM

## 2018-02-05 NOTE — Therapy (Signed)
[place] [facility] [address] [name], [address] Phone: [phone]   Fax:  [phone]  Physical Therapy Treatment  Patient Details  Name: Dawn Lambert MRN: [id number] Date of Birth: 25-Sep-1990 Referring Provider (PT): Fulton Mole   Encounter Date: 02/05/2018  PT End of Session - 02/05/18 1038    Visit Number  8    Date for PT Re-Evaluation  02/11/18    Authorization Type  4/8    PT Start Time  1034    PT Stop Time  1115    PT Time Calculation (min)  41 min    Activity Tolerance  Patient tolerated treatment well    Behavior During Therapy  [facility] for tasks assessed/performed       Past Medical History:  Diagnosis Date  . Anxiety   . Arnold-Chiari malformation (HCC)   . Atrial septal defect   . Complication of anesthesia    when pt was 11, woke up during surgery (muscle biopsy)  . Constipation   . Contraception management   . Dandruff   . Dermatomyositis (HCC)    age 27  . Diabetes mellitus without complication (HCC)   . Elevated blood pressure   . Hypertension   . Hypothyroidism   . Mental retardation   . Mood disorder (HCC)   . Overweight(278.02)   . Pelvic pain   . Prediabetes     Past Surgical History:  Procedure Laterality Date  . ASD REPAIR     at age 27  . UMBILICAL HERNIA REPAIR N/A 09/17/2017   Procedure: HERNIA REPAIR UMBILICAL ADULT;  Surgeon: Earline Mayotte, MD;  Location: [facility] ORS;  Service: General;  Laterality: N/A;    There were no vitals filed for this visit.  Subjective Assessment - 02/05/18 1036    Subjective  Patient reports her pain is 4/10 today. States that she does have some soreness when waking up.     Currently in Pain?  Yes    Pain Score  4     Pain Location  Back    Pain Orientation  Mid    Pain Descriptors / Indicators  Sore    Pain Type  Chronic pain    Pain Onset  More than a month ago         TREATMENT  Ther-ex   Supine Lower Trunk  Rotation 2 x 10; Hooklying marches x 15 each; Hooklying isometric clams with belt 3s hold 2 x 15; Hooklying isometric adductor ball squeeze 3s hold 2 x 15; Supine Piriformis Stretch with Foot on Ground 30s hold x 3 each; seated HS stretch 3x30s; Sidelying Thoracic Rotation with Open Book x 10 bilateral with verbal/tactile cues  Sidelying SLR hip abduction x 15 bilateral verbal/tactile cues;  Practice lifting techniques with 5# in box, x10, intermittent verbal cues for knee flexion. Much improvement from previous sessions.   x3mins of manual therapy: Thoracic spine mobilizations grade III-IV (t5-T12), 1 cavitation noted   Pt response to session: Patient needed verbal cueing for pacing of activities/form for proper technique, improvement noted with repetition. Reports her back pain is 1/10 at end of session.      PT Education - 02/05/18 1037    Education provided  Yes    Education Details  therex technique/form     Person(s) Educated  Patient    Methods  Explanation    Comprehension  Verbalized understanding;Returned demonstration       PT Short Term Goals -  02/05/18 1213      PT SHORT TERM GOAL #1   Title  Patient will demonstrate proper posture, body mechanics for bed mobility, daily functional tasks to assist with pain control in back and LE's    Baseline  demonstrated body mechanics and bed mobility WFLs and with proper techniques    Status  Achieved    Target Date  02/05/18        PT Long Term Goals - 01/23/18 1122      PT LONG TERM GOAL #1   Title  Patient will demonstrate improved self perceived impairment with daily tasks with decreased pain in back and LE's as indicated by MODI improvement to less than 20%    Baseline  MODI 30%. Patient has not attended several PT visits, goals extended    Time  2    Period  Weeks    Status  On-going    Target Date  02/07/18      PT LONG TERM GOAL #2   Title  Patient will report max pain level of 3/10 in back and LE's  with aggravating activities to allow improved function with daily tasks     Baseline  max pain level 6-7/10 with aggravating activities : Patient has not attended several PT visits, goals extended    Time  2    Period  Weeks    Status  On-going    Target Date  02/07/18      PT LONG TERM GOAL #3   Title  Patient will be independent with home program for proper body mechanics, exercises for strength and flexibility to allow transition to self management when discharged from pysical therpay    Baseline  Final HEP not completed, patient needs further education    Time  2    Period  Weeks    Status  On-going    Target Date  02/07/18      PT LONG TERM GOAL #4   Title  Patient will demonstrate proper body mechanics with mobility and lifting to prevent reinjury.    Baseline  Patient needs further instruction and practice to ensure carry over into daily activities.    Time  2    Period  Weeks    Status  On-going    Target Date  02/07/18            Plan - 02/05/18 1213    Clinical Impression Statement  Patient demonstrated improved body mechanics with lifting and bending with minimal to no verbal cues. Patient reported pain improved to 1/10 at end of session. Overall patient is progressing towards goals.     PT Treatment/Interventions  Patient/family education;Therapeutic activities;Therapeutic exercise;Aquatic Therapy;Cryotherapy;Electrical Stimulation;Iontophoresis 4mg /ml Dexamethasone;Moist Heat;Traction;Ultrasound;Balance training;Functional mobility training;Gait training;Neuromuscular re-education;Dry needling;Passive range of motion;Manual techniques;Energy conservation    Consulted and Agree with Plan of Care  Patient       Patient will benefit from skilled therapeutic intervention in order to improve the following deficits and impairments:  Pain, Improper body mechanics, Postural dysfunction, Difficulty walking, Decreased strength, Hypomobility, Decreased endurance, Decreased  range of motion, Impaired flexibility, Impaired perceived functional ability, Decreased activity tolerance  Visit Diagnosis: Low back pain with sciatica, sciatica laterality unspecified, unspecified back pain laterality, unspecified chronicity  Muscle weakness (generalized)  Other abnormalities of gait and mobility  Muscle spasm of back  Pain in thoracic spine     Problem List Patient Active Problem List   Diagnosis Date Noted  . Adjustment disorder with mixed disturbance of emotions  and conduct 10/12/2017  . Intellectual disability 10/12/2017  . Umbilical hernia without obstruction and without gangrene 08/15/2017  . Chest pain 11/05/2012  . Atrial septal defect 09/19/2011    Olga Coaster PT, DPT 12:17 PM,02/05/18 [phone]  [place] [facility] PHYSICAL AND SPORTS MEDICINE [address], [name], [address] Phone: [phone]   Fax:  [phone]  Name: Dawn Lambert MRN: [id number] Date of Birth: 1991/03/26

## 2018-02-07 ENCOUNTER — Ambulatory Visit: Payer: Medicaid Other

## 2018-02-07 DIAGNOSIS — M5440 Lumbago with sciatica, unspecified side: Secondary | ICD-10-CM | POA: Diagnosis not present

## 2018-02-07 DIAGNOSIS — M546 Pain in thoracic spine: Secondary | ICD-10-CM

## 2018-02-07 DIAGNOSIS — M6281 Muscle weakness (generalized): Secondary | ICD-10-CM

## 2018-02-07 DIAGNOSIS — R2689 Other abnormalities of gait and mobility: Secondary | ICD-10-CM

## 2018-02-07 DIAGNOSIS — M62830 Muscle spasm of back: Secondary | ICD-10-CM

## 2018-02-07 NOTE — Patient Instructions (Signed)
Access Code: [email or web address]  URL: [email or web address]  Date: 02/07/2018  Prepared by: Olga Coaster   Exercises  Standing Quadriceps Stretch - 3 reps - 1 sets - 30 hold - 1x daily - 7x weekly  Gastroc Stretch on Wall - 3 reps - 1 sets - 30 hold - 1x daily - 7x weekly  Seated Hamstring Stretch with Chair - 3 reps - 1 sets - 30 hold - 1x daily - 7x weekly  Supine Piriformis Stretch with Foot on Ground - 10 reps - 3 sets - 30 hold - 1x daily - 7x weekly  Sidelying Thoracic Rotation with Open Book - 5 reps - 1 sets - 5 hold - 1x daily - 7x weekly  Supine Lower Trunk Rotation - 10 reps - 3 sets - 2x daily - 7x weekly  Prone Press Up - 10 reps - 2 sets - 2x daily - 7x weekly  Supine Bridge - 10 reps - 1 sets - 1x daily - 3x weekly  Hooklying Clamshell with Resistance - 10 reps - 1 sets - 1x daily - 3x weekly  Supine March - 10 reps - 2 sets - 1x daily - 3x weekly  Hip Extension with Leg Straight - 10 reps - 3 sets - 1x daily - 3x weekly

## 2018-02-07 NOTE — Therapy (Signed)
[place] [facility] AND SPORTS MEDICINE [address] [place], [address] Phone: [phone]   Fax:  [phone]  Physical Therapy Treatment and Discharge Note  Patient attended physical therapy from 12/05/2017-02/07/2018 for a total of 9 visits. Most long term goals not met, however patient reports improvement in functional activities, decrease in overall pain, and ability to self manage condition with HEP.  Patient Details  Name: Dawn Lambert MRN: [id number] Date of Birth: 03/13/1991 Referring Provider (PT): Kingsley Spittle   Encounter Date: 02/07/2018  PT End of Session - 02/07/18 0953    Visit Number  8    Number of Visits  10    Date for PT Re-Evaluation  02/11/18    Authorization Type  5/8    PT Start Time  0949    PT Stop Time  1027    PT Time Calculation (min)  38 min    Activity Tolerance  Patient tolerated treatment well    Behavior During Therapy  [facility] for tasks assessed/performed       Past Medical History:  Diagnosis Date  . Anxiety   . Arnold-Chiari malformation ([place])   . Atrial septal defect   . Complication of anesthesia    when pt was 11, woke up during surgery (muscle biopsy)  . Constipation   . Contraception management   . Dandruff   . Dermatomyositis ([place])    age 27  . Diabetes mellitus without complication ([place])   . Elevated blood pressure   . Hypertension   . Hypothyroidism   . Mental retardation   . Mood disorder ([place])   . Overweight(278.02)   . Pelvic pain   . Prediabetes     Past Surgical History:  Procedure Laterality Date  . ASD REPAIR     at age 27  . UMBILICAL HERNIA REPAIR N/A 09/17/2017   Procedure: HERNIA REPAIR UMBILICAL ADULT;  Surgeon: Robert Bellow, MD;  Location: [facility] ORS;  Service: General;  Laterality: N/A;    There were no vitals filed for this visit.  Subjective Assessment - 02/07/18 0950    Subjective  Patient reports that she feels good today. States she does not  really have any pain. After her last session she states she felt "pretty good".    Patient is accompained by:  --   caregiver   Limitations  Walking    Patient Stated Goals  Patient would like to return to previous activities (walking, swimming)    Currently in Pain?  Yes    Pain Score  1     Pain Location  Back    Pain Orientation  Mid    Pain Descriptors / Indicators  Tender    Pain Type  Chronic pain    Pain Onset  More than a month ago    Aggravating Factors   walking, standing    Pain Relieving Factors  exercises, stretches, rest         [facility] PT Assessment - 02/07/18 0001      Posture/Postural Control   Posture Comments  Patient still demonstrates  slouched posture, able to self correct/more awareness during activities per patient      ROM / Strength   AROM / PROM / Strength  AROM;Strength      AROM   Overall AROM   Within functional limits for tasks performed    AROM Assessment Site  Lumbar;Hip    Lumbar Flexion  190%    Lumbar Extension  80%    Lumbar - Right Side Bend  100%    Lumbar - Left Side Bend  100%    Lumbar - Right Rotation  100%    Lumbar - Left Rotation  100%      Strength   Overall Strength  Within functional limits for tasks performed    Overall Strength Comments  b/l hip extension 4-/5, b/l hip abduction 4/5, no complaints of pain, all else [facility]      Flexibility   Soft Tissue Assessment /Muscle Length  yes    Hamstrings  mild b/l    Quadriceps  mild b/;    Piriformis  mild on L      Palpation   Palpation comment  Patient not tender to palpation at this time      Bed Mobility   Bed Mobility  --   improved posture/techniques for bed mobility       Treatment: PT and patient reviewed HEP, demonstrated all exercises and stretches, compliance importance discussed. Patient verbalized understanding and demonstrated proper technique/form with minimal verbal cues. Patient also given written instructions for proper lifting mechanics.  Standing  gastroc stretch x15s b/l Standing hip flexor stretch x 15s b/l Seated HS stretch b/l x20s  Supine LTR x10 b/l Supine piriformis stretch b/ lx15s Supine bridges x10 Supine marching x10 S/l thoracic rotation stretch 5x5s b/l hooklying hip abduction with GTB x10 Prone hip extension 2x5 b/l Prone press ups x10    PT Education - 02/07/18 0951    Education provided  Yes    Education Details  therex technique/form    Person(s) Educated  Patient    Methods  Explanation    Comprehension  Verbalized understanding;Returned demonstration       PT Short Term Goals - 02/05/18 1213      PT SHORT TERM GOAL #1   Title  Patient will demonstrate proper posture, body mechanics for bed mobility, daily functional tasks to assist with pain control in back and LE's    Baseline  demonstrated body mechanics and bed mobility WFLs and with proper techniques    Status  Achieved    Target Date  02/05/18        PT Long Term Goals - 02/07/18 0952      PT LONG TERM GOAL #1   Title  Patient will demonstrate improved self perceived impairment with daily tasks with decreased pain in back and LE's as indicated by MODI improvement to less than 20%    Baseline  mODI: 26%    Status  Not Met      PT LONG TERM GOAL #2   Title  Patient will report max pain level of 3/10 in back and LE's with aggravating activities to allow improved function with daily tasks     Baseline  Patient reports max pain levels while walking is 4/10.    Status  Not Met      PT LONG TERM GOAL #3   Title  Patient will be independent with home program for proper body mechanics, exercises for strength and flexibility to allow transition to self management when discharged from pysical therpay    Baseline  Final HEP not completed, but patient reports performing HEP 1x a day.     Status  Achieved      PT LONG TERM GOAL #4   Title  Patient will demonstrate proper body mechanics with mobility and lifting to prevent reinjury.    Baseline   demonstrates proper form/technique with  body mechanics    Status  Achieved    Target Date  02/07/18            Plan - 02/07/18 1030    Clinical Impression Statement  At this time patient reports overall improvement in back pain, leg pain. States that when her pain increases, she uses her stretches and exercises to decrease her pain. Also the patient states that she is more aware of her posture and knows that it causes some of her pain, and adjusts her posture to fix her pain as well. PT and patient reviewed body mechanics last session with patient demonstrating appropriate bending/lifting body mechanics to prevent reinjury with minimal verbal cues from PT. Upon assessment patient demonstrates lumbar and hip ROM and strength WFLs, with mild deficits in b/l hip strength, and no TTP. At this time patient agreeable to discharge with HEP to self manage condition at home.    PT Treatment/Interventions  Patient/family education;Therapeutic activities;Therapeutic exercise;Aquatic Therapy;Cryotherapy;Electrical Stimulation;Iontophoresis 50m/ml Dexamethasone;Moist Heat;Traction;Ultrasound;Balance training;Functional mobility training;Gait training;Neuromuscular re-education;Dry needling;Passive range of motion;Manual techniques;Energy conservation    Consulted and Agree with Plan of Care  Patient    Family Member Consulted  caregiver       Patient will benefit from skilled therapeutic intervention in order to improve the following deficits and impairments:  Pain, Decreased strength, Postural dysfunction  Visit Diagnosis: Low back pain with sciatica, sciatica laterality unspecified, unspecified back pain laterality, unspecified chronicity  Muscle weakness (generalized)  Other abnormalities of gait and mobility  Muscle spasm of back  Pain in thoracic spine     Problem List Patient Active Problem List   Diagnosis Date Noted  . Adjustment disorder with mixed disturbance of emotions and  conduct 10/12/2017  . Intellectual disability 10/12/2017  . Umbilical hernia without obstruction and without gangrene 08/15/2017  . Chest pain 11/05/2012  . Atrial septal defect 09/19/2011    DLieutenant DiegoPT, DPT 10:40 AM,02/07/18 [place] [address] [place] [address]Phone: [phone]  Fax:  [phone] Name: LHARVEST DEISTMRN: [id number] of Birth: [birth year]

## 2018-03-02 ENCOUNTER — Emergency Department
Admission: EM | Admit: 2018-03-02 | Discharge: 2018-03-03 | Disposition: A | Discharge status: Home / Self Care | Payer: Medicaid Other | Attending: Emergency Medicine | Admitting: Emergency Medicine

## 2018-03-02 ENCOUNTER — Other Ambulatory Visit: Payer: Self-pay

## 2018-03-02 DIAGNOSIS — F39 Unspecified mood [affective] disorder: Secondary | ICD-10-CM | POA: Insufficient documentation

## 2018-03-02 DIAGNOSIS — I10 Essential (primary) hypertension: Secondary | ICD-10-CM | POA: Diagnosis not present

## 2018-03-02 DIAGNOSIS — R4689 Other symptoms and signs involving appearance and behavior: Secondary | ICD-10-CM | POA: Diagnosis present

## 2018-03-02 DIAGNOSIS — E039 Hypothyroidism, unspecified: Secondary | ICD-10-CM | POA: Diagnosis not present

## 2018-03-02 DIAGNOSIS — F329 Major depressive disorder, single episode, unspecified: Secondary | ICD-10-CM | POA: Diagnosis not present

## 2018-03-02 DIAGNOSIS — E119 Type 2 diabetes mellitus without complications: Secondary | ICD-10-CM | POA: Diagnosis not present

## 2018-03-02 LAB — URINE DRUG SCREEN, QUALITATIVE (ARMC ONLY)
Amphetamines, Ur Screen: NOT DETECTED
Barbiturates, Ur Screen: NOT DETECTED
Benzodiazepine, Ur Scrn: POSITIVE — AB
Cannabinoid 50 Ng, Ur ~~LOC~~: NOT DETECTED
Cocaine Metabolite,Ur ~~LOC~~: NOT DETECTED
MDMA (Ecstasy)Ur Screen: NOT DETECTED
Methadone Scn, Ur: NOT DETECTED
Opiate, Ur Screen: NOT DETECTED
Phencyclidine (PCP) Ur S: NOT DETECTED
Tricyclic, Ur Screen: POSITIVE — AB

## 2018-03-02 LAB — CBC
HCT: 36.4 % (ref 36.0–46.0)
Hemoglobin: 11.6 g/dL — ABNORMAL LOW (ref 12.0–15.0)
MCH: 29.1 pg (ref 26.0–34.0)
MCHC: 31.9 g/dL (ref 30.0–36.0)
MCV: 91.2 fL (ref 80.0–100.0)
Platelets: 298 10*3/uL (ref 150–400)
RBC: 3.99 MIL/uL (ref 3.87–5.11)
RDW: 12.8 % (ref 11.5–15.5)
WBC: 9.6 10*3/uL (ref 4.0–10.5)
nRBC: 0 % (ref 0.0–0.2)

## 2018-03-02 LAB — COMPREHENSIVE METABOLIC PANEL
ALT: 20 U/L (ref 0–44)
AST: 20 U/L (ref 15–41)
Albumin: 3.7 g/dL (ref 3.5–5.0)
Alkaline Phosphatase: 84 U/L (ref 38–126)
Anion gap: 9 (ref 5–15)
BUN: 8 mg/dL (ref 6–20)
CO2: 25 mmol/L (ref 22–32)
Calcium: 9 mg/dL (ref 8.9–10.3)
Chloride: 108 mmol/L (ref 98–111)
Creatinine, Ser: 0.66 mg/dL (ref 0.44–1.00)
GFR calc Af Amer: >60 mL/min (ref >60)
GFR calc non Af Amer: >60 mL/min (ref >60)
Glucose, Bld: 248 mg/dL — ABNORMAL HIGH (ref 70–99)
Potassium: 3.4 mmol/L — ABNORMAL LOW (ref 3.5–5.1)
Sodium: 142 mmol/L (ref 135–145)
Total Bilirubin: 0.2 mg/dL — ABNORMAL LOW (ref 0.3–1.2)
Total Protein: 7.7 g/dL (ref 6.5–8.1)

## 2018-03-02 LAB — POCT PREGNANCY, URINE: Preg Test, Ur: NEGATIVE

## 2018-03-02 LAB — LITHIUM LEVEL: Lithium Lvl: 0.24 mmol/L — ABNORMAL LOW (ref 0.60–1.20)

## 2018-03-02 MED ORDER — LITHIUM CARBONATE 300 MG PO CAPS
600.0000 mg | ORAL_CAPSULE | Freq: Every day | ORAL | Status: DC
  Filled 2018-03-02: qty 2

## 2018-03-02 MED ORDER — PROPRANOLOL HCL 10 MG PO TABS
ORAL_TABLET | ORAL | Status: AC
  Filled 2018-03-02: qty 4

## 2018-03-02 MED ORDER — CLONAZEPAM 1 MG PO TABS
1.0000 mg | ORAL_TABLET | Freq: Two times a day (BID) | ORAL | Status: DC
  Administered 2018-03-02 – 2018-03-03 (×2): 1 mg via ORAL
  Filled 2018-03-02: qty 1

## 2018-03-02 MED ORDER — SERTRALINE HCL 100 MG PO TABS
100.0000 mg | ORAL_TABLET | Freq: Every day | ORAL | Status: DC
  Administered 2018-03-02 – 2018-03-03 (×2): 100 mg via ORAL
  Filled 2018-03-02: qty 1

## 2018-03-02 MED ORDER — CLONAZEPAM 1 MG PO TABS
ORAL_TABLET | ORAL | Status: AC
  Filled 2018-03-02: qty 1

## 2018-03-02 MED ORDER — LISINOPRIL 5 MG PO TABS
10.0000 mg | ORAL_TABLET | Freq: Every day | ORAL | Status: DC
  Administered 2018-03-02 – 2018-03-03 (×2): 10 mg via ORAL
  Filled 2018-03-02: qty 2

## 2018-03-02 MED ORDER — CARBAMAZEPINE 200 MG PO TABS
ORAL_TABLET | ORAL | Status: AC
  Filled 2018-03-02: qty 2

## 2018-03-02 MED ORDER — LEVOTHYROXINE SODIUM 75 MCG PO TABS: 175.0000 ug | ORAL_TABLET | Freq: Every day | ORAL | Status: DC

## 2018-03-02 MED ORDER — CARBAMAZEPINE 200 MG PO TABS
400.0000 mg | ORAL_TABLET | Freq: Two times a day (BID) | ORAL | Status: DC
  Administered 2018-03-02 – 2018-03-03 (×2): 400 mg via ORAL
  Filled 2018-03-02: qty 2

## 2018-03-02 MED ORDER — PROPRANOLOL HCL 40 MG PO TABS
40.0000 mg | ORAL_TABLET | Freq: Two times a day (BID) | ORAL | Status: DC
  Administered 2018-03-02 – 2018-03-03 (×2): 40 mg via ORAL
  Filled 2018-03-02: qty 1
  Filled 2018-03-02: qty 4
  Filled 2018-03-02 (×2): qty 1

## 2018-03-02 MED ORDER — QUETIAPINE FUMARATE 200 MG PO TABS
ORAL_TABLET | ORAL | Status: AC
  Administered 2018-03-02: 200 mg
  Filled 2018-03-02: qty 1

## 2018-03-02 MED ORDER — SERTRALINE HCL 100 MG PO TABS
ORAL_TABLET | ORAL | Status: AC
  Filled 2018-03-02: qty 1

## 2018-03-02 MED ORDER — ASENAPINE MALEATE 5 MG SL SUBL
10.0000 mg | SUBLINGUAL_TABLET | Freq: Two times a day (BID) | SUBLINGUAL | Status: DC
  Administered 2018-03-03: 10 mg via SUBLINGUAL
  Filled 2018-03-02 (×4): qty 2

## 2018-03-02 MED ORDER — QUETIAPINE FUMARATE 25 MG PO TABS
ORAL_TABLET | ORAL | Status: AC
  Administered 2018-03-02: 100 mg
  Filled 2018-03-02: qty 4

## 2018-03-02 MED ORDER — METFORMIN HCL 500 MG PO TABS
500.0000 mg | ORAL_TABLET | Freq: Every day | ORAL | Status: DC
  Administered 2018-03-03: 500 mg via ORAL
  Filled 2018-03-02: qty 1

## 2018-03-02 MED ORDER — QUETIAPINE FUMARATE ER 300 MG PO TB24
300.0000 mg | ORAL_TABLET | Freq: Every day | ORAL | Status: DC
  Filled 2018-03-02 (×2): qty 1

## 2018-03-02 MED ORDER — GABAPENTIN 300 MG PO CAPS
600.0000 mg | ORAL_CAPSULE | Freq: Three times a day (TID) | ORAL | Status: DC
  Administered 2018-03-03: 600 mg via ORAL
  Filled 2018-03-02 (×2): qty 2

## 2018-03-02 MED ORDER — LISINOPRIL 5 MG PO TABS
ORAL_TABLET | ORAL | Status: AC
  Filled 2018-03-02: qty 2

## 2018-03-02 NOTE — ED Notes (Signed)
This tech in [place] area received report from kadijah EDT, this tech will remain in [place] area and continue to do q 15 min checks  
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
## 2018-03-02 NOTE — ED Notes (Signed)
Patient transferred from quad to room 5, she is alert and oriented, states ' I will kill myself if I have to go back to that same group home, she states " I had rather stay here than be there' Patient easily directed and cooperative, no behavioral issues. Nurse will continue to monitor q 15 minute checks and camera surveillance in progress for safety.

## 2018-03-02 NOTE — ED Notes (Signed)
Pt provided with sandwich try and drink  

## 2018-03-02 NOTE — ED Notes (Addendum)
Pt dressed out in scrubs by EDT Vernona Rieger and myself present. Belongings to include: 1 pair of pink slippers 1 underwear 1 bra 1 yellow shirt  1 pair pants 1 gray jacket 1 watch 1 pair of hoop earrings 2 beaded bracelets 2 rubber bands

## 2018-03-02 NOTE — ED Notes (Signed)
Group home contact info  [facility]  [phone]  [place]

## 2018-03-02 NOTE — ED Notes (Signed)
20H 
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
## 2018-03-02 NOTE — ED Triage Notes (Addendum)
Pt comes via [employer] Officer Boggs. Pt is IVC. Pt comes from [facility] in [place]. Pt states she didn't want to be there so she pulled down her pants. Pt states if she is sent back to group home she is going to keep pulling down her pants, hurt herself or run away. Pt is calm and cooperative.  Pt denies HI.

## 2018-03-02 NOTE — ED Notes (Signed)
Red top not drawn at this time. Labels sent with patient.

## 2018-03-03 NOTE — ED Notes (Signed)
Pt discharged back to group home in care of group home staff.  VS stable. Pt denies SI/HI.  All belongings returned to patient (including jewelry).  Discharge instructions reviewed with group home staff member. [facility] staff signed for pt discharge.

## 2018-03-03 NOTE — ED Notes (Signed)
Pt given breakfast tray

## 2018-03-03 NOTE — ED Notes (Signed)
ER secretary contacted group home. Representative will be here to pick up patient this afternoon.

## 2018-03-03 NOTE — ED Notes (Signed)
Repeat SOC is recommending discharge.  Pt is requesting a walker when she leaves the hospital.   *Pt has ambulated on unit without difficultly.

## 2018-03-03 NOTE — ED Notes (Signed)
RN received call from first nurse that patient's group home was here to pick up patient.   Patient still under IVC.  EDP and ER secretary have ordered repeat SOC.

## 2018-03-03 NOTE — BH Assessment (Signed)
This Clinical research associate confirmed with ED Secretary Ronnie that original [facility] recommends d/c however EDP Schaevitz declined to rescind IVC and ordered a repeat SOC.

## 2018-03-03 NOTE — ED Provider Notes (Signed)
-----------------------------------------   6:51 AM on 03/03/2018 -----------------------------------------   Blood pressure 122/79, pulse (!) 108, temperature 98.3 F (36.8 C), temperature source Oral, resp. rate 18, height 5\' 3"  (1.6 m), weight 132.9 kg, SpO2 98 %.  The patient had no acute events since last update.  Calm and cooperative at this time.  Disposition is pending Psychiatry/Behavioral Medicine team recommendations.    Dionne Bucy, MD 03/03/18 [phone]

## 2018-03-03 NOTE — ED Notes (Signed)
RN attempted to call group home. No answer. Unable to leave message.

## 2018-03-03 NOTE — ED Provider Notes (Signed)
-----------------------------------------   2:08 PM on 03/03/2018 -----------------------------------------   Blood pressure 101/65, pulse 74, temperature 98.1 F (36.7 C), temperature source Oral, resp. rate 18, height 5\' 3"  (1.6 m), weight 132.9 kg, SpO2 98 %.  The patient had no acute events since last update.  Calm and cooperative at this time.  Evaluated once again by specialist on-call psychiatry who is recommending discharge at this time.  IVC reversed by Dr. Loretha Brasil.  Patient without any further complaints of suicidal or homicidal ideation.    Myrna Blazer, MD 03/03/18 1410

## 2018-03-03 NOTE — BH Assessment (Signed)
This Clinical research associate received return call from patients Dawn Lambert/mother Renee and she was agreeable with patient being discharged from [facility] and back to [facility].

## 2018-03-03 NOTE — BH Assessment (Addendum)
SOC recommends patient be discharged back to group home.   This Clinical research associate contacted [facility] at [phone] and spoke with [name]. [name] stated they will be here to pick up patient before 12:00pm today.   This Clinical research associate contacted patients LG/mother, at [phone] to inform that patient will be discharged from [facility] back to group home. No answer/ left HIPPA compliant message for return call.

## 2018-03-03 NOTE — ED Notes (Signed)
SOC camera at bedside.   Maintained on 15 minute checks and observation by security camera for safety.

## 2018-03-24 ENCOUNTER — Emergency Department
Admission: EM | Admit: 2018-03-24 | Discharge: 2018-03-25 | Disposition: A | Discharge status: Home / Self Care | Payer: Medicaid Other | Attending: Emergency Medicine | Admitting: Emergency Medicine

## 2018-03-24 ENCOUNTER — Other Ambulatory Visit: Payer: Self-pay

## 2018-03-24 ENCOUNTER — Encounter: Payer: Self-pay | Admitting: Emergency Medicine

## 2018-03-24 DIAGNOSIS — F329 Major depressive disorder, single episode, unspecified: Secondary | ICD-10-CM | POA: Diagnosis not present

## 2018-03-24 DIAGNOSIS — R4689 Other symptoms and signs involving appearance and behavior: Secondary | ICD-10-CM

## 2018-03-24 DIAGNOSIS — F79 Unspecified intellectual disabilities: Secondary | ICD-10-CM | POA: Diagnosis not present

## 2018-03-24 DIAGNOSIS — E039 Hypothyroidism, unspecified: Secondary | ICD-10-CM | POA: Insufficient documentation

## 2018-03-24 DIAGNOSIS — I10 Essential (primary) hypertension: Secondary | ICD-10-CM | POA: Diagnosis not present

## 2018-03-24 DIAGNOSIS — F4325 Adjustment disorder with mixed disturbance of emotions and conduct: Secondary | ICD-10-CM | POA: Insufficient documentation

## 2018-03-24 DIAGNOSIS — E119 Type 2 diabetes mellitus without complications: Secondary | ICD-10-CM | POA: Insufficient documentation

## 2018-03-24 DIAGNOSIS — Z046 Encounter for general psychiatric examination, requested by authority: Secondary | ICD-10-CM | POA: Diagnosis present

## 2018-03-24 DIAGNOSIS — Z7984 Long term (current) use of oral hypoglycemic drugs: Secondary | ICD-10-CM | POA: Insufficient documentation

## 2018-03-24 DIAGNOSIS — Z79899 Other long term (current) drug therapy: Secondary | ICD-10-CM | POA: Diagnosis not present

## 2018-03-24 DIAGNOSIS — F913 Oppositional defiant disorder: Secondary | ICD-10-CM

## 2018-03-24 LAB — URINE DRUG SCREEN, QUALITATIVE (ARMC ONLY)
Amphetamines, Ur Screen: NOT DETECTED
Barbiturates, Ur Screen: NOT DETECTED
Benzodiazepine, Ur Scrn: NOT DETECTED
Cannabinoid 50 Ng, Ur ~~LOC~~: NOT DETECTED
Cocaine Metabolite,Ur ~~LOC~~: NOT DETECTED
MDMA (Ecstasy)Ur Screen: NOT DETECTED
Methadone Scn, Ur: NOT DETECTED
Opiate, Ur Screen: NOT DETECTED
Phencyclidine (PCP) Ur S: NOT DETECTED
Tricyclic, Ur Screen: NOT DETECTED

## 2018-03-24 LAB — CBC
HCT: 36.1 % (ref 36.0–46.0)
Hemoglobin: 11.6 g/dL — ABNORMAL LOW (ref 12.0–15.0)
MCH: 29.1 pg (ref 26.0–34.0)
MCHC: 32.1 g/dL (ref 30.0–36.0)
MCV: 90.7 fL (ref 80.0–100.0)
Platelets: 309 10*3/uL (ref 150–400)
RBC: 3.98 MIL/uL (ref 3.87–5.11)
RDW: 12.3 % (ref 11.5–15.5)
WBC: 11.2 10*3/uL — ABNORMAL HIGH (ref 4.0–10.5)
nRBC: 0 % (ref 0.0–0.2)

## 2018-03-24 LAB — COMPREHENSIVE METABOLIC PANEL
ALT: 20 U/L (ref 0–44)
AST: 17 U/L (ref 15–41)
Albumin: 4 g/dL (ref 3.5–5.0)
Alkaline Phosphatase: 74 U/L (ref 38–126)
Anion gap: 9 (ref 5–15)
BUN: 9 mg/dL (ref 6–20)
CO2: 25 mmol/L (ref 22–32)
Calcium: 9.1 mg/dL (ref 8.9–10.3)
Chloride: 99 mmol/L (ref 98–111)
Creatinine, Ser: 0.64 mg/dL (ref 0.44–1.00)
GFR calc Af Amer: >60 mL/min (ref >60)
GFR calc non Af Amer: >60 mL/min (ref >60)
Glucose, Bld: 149 mg/dL — ABNORMAL HIGH (ref 70–99)
Potassium: 3.6 mmol/L (ref 3.5–5.1)
Sodium: 133 mmol/L — ABNORMAL LOW (ref 135–145)
Total Bilirubin: 0.3 mg/dL (ref 0.3–1.2)
Total Protein: 7.8 g/dL (ref 6.5–8.1)

## 2018-03-24 LAB — SALICYLATE LEVEL: Salicylate Lvl: <7 mg/dL (ref 2.8–30.0)

## 2018-03-24 LAB — ACETAMINOPHEN LEVEL: Acetaminophen (Tylenol), Serum: <10 ug/mL — ABNORMAL LOW (ref 10–30)

## 2018-03-24 LAB — ETHANOL: Alcohol, Ethyl (B): <10 mg/dL (ref <10)

## 2018-03-24 NOTE — ED Notes (Signed)
Hourly rounding reveals patient in room. No complaints, stable, in no acute distress. Q15 minute rounds and monitoring via Rover and Officer to continue.   

## 2018-03-24 NOTE — ED Notes (Signed)
Pt states she wants to go to a state hospital and was pulling down her pants so that she would be brought here and hopefully taken to a state hospital. Pt is calm and cooperative at this time. Pt given meal tray and drink.

## 2018-03-24 NOTE — ED Provider Notes (Addendum)
[facility] Emergency Department Provider Note   ____________________________________________    I have reviewed the triage vital signs and the nursing notes.   HISTORY  Chief Complaint Psychiatric Evaluation     HPI Dawn Lambert is a 27 y.o. female with a history of anxiety, intellectual disability resents because she is unhappy with her group home.  She states that she wants to go to a state hospital so her medications can be adjusted.  She states that she will continue trying to leave the group home and/or taking off her clothes if we send her back to her group home.  She denies SI or HI.  Review of records demonstrates recent ED evaluation for similar complaints   Past Medical History:  Diagnosis Date  . Anxiety   . Arnold-Chiari malformation (HCC)   . Atrial septal defect   . Complication of anesthesia    when pt was 11, woke up during surgery (muscle biopsy)  . Constipation   . Contraception management   . Dandruff   . Dermatomyositis (HCC)    age 89  . Diabetes mellitus without complication (HCC)   . Elevated blood pressure   . Hypertension   . Hypothyroidism   . Mental retardation   . Mood disorder (HCC)   . Overweight(278.02)   . Pelvic pain   . Prediabetes     Patient Active Problem List   Diagnosis Date Noted  . 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 Intellectual disability 10/12/2017  . Umbilical hernia without obstruction and without gangrene 08/15/2017  . Chest pain 11/05/2012  . Atrial septal defect 09/19/2011    Past Surgical History:  Procedure Laterality Date  . ASD REPAIR     at age 89  . UMBILICAL HERNIA REPAIR N/A 09/17/2017   Procedure: HERNIA REPAIR UMBILICAL ADULT;  Surgeon: [name], [name], MD;  Location: [facility] ORS;  Service: General;  Laterality: N/A;    Prior to Admission medications   Medication Sig Start Date End Date Taking? Authorizing Provider    Asenapine Maleate (SAPHRIS) 10 MG SUBL Place 10 mg under the tongue 2 (two) times daily.    [provider]  atorvastatin (LIPITOR) 10 MG tablet Take 10 mg by mouth daily at 6 PM.     [provider]  carbamazepine (TEGRETOL) 200 MG tablet Take 400 mg by mouth 2 (two) times daily.    [provider]  cholecalciferol (VITAMIN D) 1000 units tablet Take 1,000 Units by mouth daily.     [provider]  clonazePAM (KLONOPIN) 1 MG tablet Take 1 mg by mouth 2 (two) times daily.    [provider]  clotrimazole (LOTRIMIN) 1 % cream Apply 1 application topically 2 (two) times daily.    [provider]  fluconazole (DIFLUCAN) 150 MG tablet Take 150 mg by mouth See admin instructions. Take 150mg  by mouth once, take 150mg  again if yeast symptoms return after taking prescribed clindamycin antibiotic    [provider]  Fluocinolone Acetonide Scalp (DERMA-SMOOTHE/FS SCALP) 0.01 % OIL Apply 1-5 application topically See admin instructions. Apply to scalp every week for psoriasis but may increase up to 5 days per week as needed.    [provider]  fluticasone (FLONASE) 50 MCG/ACT nasal spray Place 2 sprays into the nose daily as needed for allergies.     [provider]  gabapentin (NEURONTIN) 600 MG tablet Take 600 mg by mouth 3 (three) times  daily.    [provider]  levothyroxine (SYNTHROID, LEVOTHROID) 175 MCG tablet Take 175 mcg by mouth daily before breakfast.    [provider]  lisinopril (PRINIVIL,ZESTRIL) 20 MG tablet Take 20 mg by mouth daily.     [provider]  lithium carbonate 300 MG capsule Take 600 mg by mouth at bedtime.     [provider]  LORazepam (ATIVAN) 1 MG tablet Take 1 mg by mouth daily as needed for anxiety.     [provider]  magnesium hydroxide (MILK OF MAGNESIA) 400 MG/5ML suspension Take 30 mLs by mouth at bedtime as needed for moderate constipation.      [provider]  medroxyPROGESTERone (DEPO-PROVERA) 150 MG/ML injection Inject 150 mg into the muscle See admin instructions. "Every 12 weeks"    [provider]  metFORMIN (GLUCOPHAGE) 500 MG tablet Take 500 mg by mouth daily.     [provider]  mometasone (ELOCON) 0.1 % cream Apply 1 application topically daily as needed (Itching of the ears).     [provider]  Multiple Vitamin (THEREMS PO) Take 1 tablet by mouth daily.    [provider]  Paliperidone Palmitate ER (INVEGA TRINZA) 819 MG/2.625ML SUSY Inject 819 mg into the muscle every 3 (three) months.    [provider]  propranolol (INDERAL) 40 MG tablet Take 40 mg by mouth 2 (two) times daily.    [provider]  QUEtiapine (SEROQUEL XR) 300 MG 24 hr tablet Take 300 mg by mouth at bedtime.    [provider]  sertraline (ZOLOFT) 100 MG tablet Take 100 mg by mouth daily.     [provider]     Allergies Augmentin [amoxicillin-pot clavulanate]  History reviewed. No pertinent family history.  Social History Social History   Tobacco Use  . Smoking status: Never Smoker  . Smokeless tobacco: Never Used  Substance Use Topics  . Alcohol use: No  . Drug use: No    Review of Systems  Constitutional: No fever/chills Eyes: No visual changes.  ENT: No sore throat. Cardiovascular: Denies chest pain. Respiratory: Denies shortness of breath. Gastrointestinal: No abdominal pain.  No nausea, no vomiting.   Genitourinary: Negative for dysuria. Musculoskeletal: Negative for back pain. Skin: Negative for rash. Neurological: Negative for headaches or weakness   ____________________________________________   PHYSICAL EXAM:  VITAL SIGNS: ED Triage Vitals  Enc Vitals Group     BP 03/24/18 1710 (!) 106/93     Pulse Rate 03/24/18 1710 72     Resp 03/24/18 1710 18     Temp 03/24/18 1710 98.4 F (36.9 C)     Temp Source 03/24/18 1710 Oral     SpO2  03/24/18 1710 99 %     Weight 03/24/18 1710 132.9 kg (293 lb)     Height 03/24/18 1710 1.6 m (5\' 3" )     Head Circumference --      Peak Flow --      Pain Score 03/24/18 1723 0     Pain Loc --      Pain Edu? --      Excl. in GC? --     Constitutional: Alert   Head: Atraumatic.  Mouth/Throat: Mucous membranes are moist.    Cardiovascular: Normal rate, regular rhythm. Grossly normal heart sounds.  Good peripheral circulation. Respiratory: Normal respiratory effort.  No retractions. Lungs CTAB. Gastrointestinal: Soft and nontender. No distention.   Musculoskeletal:   Warm and well perfused Neurologic:  Normal speech and language. No gross focal neurologic deficits are appreciated.  Skin:  Skin is warm, dry and intact. No rash noted. Psychiatric: Mood and affect are normal. Speech and behavior are normal.  ____________________________________________   LABS (all labs ordered are listed, but only abnormal results are displayed)  Labs Reviewed  COMPREHENSIVE METABOLIC PANEL - Abnormal; Notable for the following components:      Result Value   Sodium 133 (*)    Glucose, Bld 149 (*)    All other components within normal limits  CBC - Abnormal; Notable for the following components:   WBC 11.2 (*)    Hemoglobin 11.6 (*)    All other components within normal limits  ETHANOL  URINE DRUG SCREEN, QUALITATIVE ([facility] ONLY)  SALICYLATE LEVEL  ACETAMINOPHEN LEVEL  POC URINE PREG, ED   ____________________________________________  EKG  None ____________________________________________  RADIOLOGY  None ____________________________________________   PROCEDURES  Procedure(s) performed: No  Procedures   Critical Care performed: No ____________________________________________   INITIAL IMPRESSION / ASSESSMENT AND PLAN / ED COURSE  Pertinent labs & imaging results that were available during my care of the patient were reviewed by me and considered in my medical decision  making (see chart for details).  Patient overall well-appearing in no acute distress.  She seems upset about her group home.  I do not believe that she requires IVC do not feel that she is a threat to herself or anyone else.  I will have TTS see her and await the recommendations.  She is medically cleared  ----------------------------------------- 10:51 PM on 03/24/2018 -----------------------------------------  TTS reports they spoke with legal guardian, group home will pick up the patient in the morning    ____________________________________________   FINAL CLINICAL IMPRESSION(S) / ED DIAGNOSES  Final diagnoses:  Oppositional behavior        Note:  This document was prepared using Dragon voice recognition software and may include unintentional dictation errors.    Jene Every, MD 03/24/18 Janett Billow    Jene Every, MD 03/24/18 2251

## 2018-03-24 NOTE — ED Triage Notes (Signed)
Pt to ED from group home with staff member and [employer] voluntarily today.  Per staff member and officer patient started taking clothes off in the street, stated wanted to come to ED for attention and a shot.  Pt has had hx of stating SI but per staff has not today.  Pt states getting upset at group home because "I was not getting my way and started taking my pants off".  Patient calm and cooperative in triage.

## 2018-03-24 NOTE — ED Notes (Signed)
Report given to Hewan RN 

## 2018-03-24 NOTE — ED Notes (Signed)
Group home staff:  Sonny Dandyic[facility] [phone] (Group Home); [phone] (cell).

## 2018-03-24 NOTE — BH Assessment (Signed)
Assessment Note  Dawn AbrahamsLauren S Lambert is an 27 y.o. female who presents to the ED from group home with staff member and [employer] voluntarily today. Per ED Triage RN " Per staff member and officer patient started taking clothes off in the street, stated wanted to come to ED for attention and a shot.  Pt has had hx of stating SI but per staff has not today.  Pt states getting upset at group home because "I was not getting my way and started taking my pants off".  Patient calm and cooperative in triage."   During the assessment the pt was calm, cooperative, and answered questions approprietly. Pt is known to this ED with similar attention seeking behaviors at her current g/h. Pt's nurse spoke with pt's guardian Dawn Lambert(Dawn Lambert Mother) and she is aware that pt is in the ED and requests that pt be sent back to the g/h upon d/c. Mother reports that this is how pt "acts out" when she doesn't get what she wants at the g/h. Pt has an upcoming court date to prove she has completed her community service following an indecent exposure charge.   This wriwer spoke with Dr. Toni Lambert and he was in agreement that it is safe for the pt to return back to her G/H at this time. EDP [name] is also is agreement that pt is medically cleared for d/c.   Diagnosis: Depression  Past Medical History:  Past Medical History:  Diagnosis Date  . Anxiety   . Arnold-Chiari malformation (HCC)   . Atrial septal defect   . Complication of anesthesia    when pt was 11, woke up during surgery (muscle biopsy)  . Constipation   . Contraception management   . Dandruff   . Dermatomyositis (HCC)    age 89  . Diabetes mellitus without complication (HCC)   . Elevated blood pressure   . Hypertension   . Hypothyroidism   . Mental retardation   . Mood disorder (HCC)   . Overweight(278.02)   . Pelvic pain   . Prediabetes     Past Surgical History:  Procedure Laterality Date  . ASD REPAIR     at age 54  . UMBILICAL HERNIA REPAIR N/A 09/17/2017    Procedure: HERNIA REPAIR UMBILICAL ADULT;  Surgeon: Earline Lambert, [name], MD;  Location: [facility] ORS;  Service: General;  Laterality: N/A;    Family History: History reviewed. No pertinent family history.  Social History:  reports that she has never smoked. She has never used smokeless tobacco. She reports that she does not drink alcohol or use drugs.  Additional Social History:  Alcohol / Drug Use Pain Medications: SEE MAR Prescriptions: SEE MAR Over the Counter: SEE MAR History of alcohol / drug use?: No history of alcohol / drug abuse  CIWA: CIWA-Ar BP: (!) 146/80 Pulse Rate: 78 COWS:    Allergies:  Allergies  Allergen Reactions  . Augmentin [Amoxicillin-Pot Clavulanate] Other (See Comments)    Has patient had a PCN reaction causing immediate rash, facial/tongue/throat swelling, SOB or lightheadedness with hypotension: ______ Has patient had a PCN reaction causing severe rash involving mucus membranes or skin necrosis: _______ Has patient had a PCN reaction that required hospitalization: _______ Has patient had a PCN reaction occurring within the last 10 years:_______ If all of the above answers are "NO", then may proceed with Cephalosporin use.     Home Medications:  (Not in a hospital admission)  OB/GYN Status:  No LMP recorded. Patient has had an  injection.  General Assessment Data Location of Assessment: [facility] ED TTS Assessment: In system Is this a Tele or Face-to-Face Assessment?: Face-to-Face Is this an Initial Assessment or a Re-assessment for this encounter?: Initial Assessment Patient Accompanied by:: N/A Language Other than English: No Living Arrangements: In Group Home: (Comment: Name of Group Home) What gender do you identify as?: Female Marital status: Single Maiden name: N/A Pregnancy Status: No Living Arrangements: Group Home Can pt return to current living arrangement?: Yes Admission Status: Voluntary Is patient capable of signing voluntary  admission?: No Referral Source: Self/Family/Friend Insurance type: MEDICAID  Medical Screening Exam [facility] Walk-in ONLY) Medical Exam completed: Yes  Crisis Care Plan Living Arrangements: Group Home Legal Guardian: Mother Name of Psychiatrist: [facility] Name of Therapist: [facility]   Education Status Is patient currently in school?: No Is the patient employed, unemployed or receiving disability?: Unemployed, Receiving disability income  Risk to self with the past 6 months Suicidal Ideation: No Has patient been a risk to self within the past 6 months prior to admission? : No Suicidal Intent: No Has patient had any suicidal intent within the past 6 months prior to admission? : No Is patient at risk for suicide?: No Suicidal Plan?: No Has patient had any suicidal plan within the past 6 months prior to admission? : No Specify Current Suicidal Plan: N/A Access to Means: No What has been your use of drugs/alcohol within the last 12 months?: Denies use Previous Attempts/Gestures: No How many times?: 0 Other Self Harm Risks: n/a Triggers for Past Attempts: Unpredictable Intentional Self Injurious Behavior: None Family Suicide History: No Recent stressful life event(s): Conflict (Comment) Persecutory voices/beliefs?: No Depression: Yes Depression Symptoms: Feeling angry/irritable Substance abuse history and/or treatment for substance abuse?: No Suicide prevention information given to non-admitted patients: Not applicable  Risk to Others within the past 6 months Homicidal Ideation: No Does patient have any lifetime risk of violence toward others beyond the six months prior to admission? : No Thoughts of Harm to Others: No Current Homicidal Intent: No Current Homicidal Plan: No Access to Homicidal Means: No Identified Victim: n/a History of harm to others?: No Assessment of Violence: None Noted Violent Behavior Description: none identified Does patient have access to  weapons?: No Criminal Charges Pending?: Yes Describe Pending Criminal Charges: Indecent exposure  Does patient have a court date: Yes Court Date: 05/20/17 Is patient on probation?: No  Psychosis Hallucinations: Auditory Delusions: None noted  Mental Status Report Appearance/Hygiene: In hospital gown Eye Contact: Good Motor Activity: Freedom of movement Speech: Unremarkable Level of Consciousness: Alert Mood: Pleasant Affect: Other (Comment) Anxiety Level: None Thought Processes: Circumstantial Judgement: Impaired Orientation: Person, Place, Time, Situation, Appropriate for developmental age Obsessive Compulsive Thoughts/Behaviors: None  Cognitive Functioning Concentration: Normal Memory: Recent Intact, Remote Intact Is patient IDD: Yes Level of Function: Mild MR Is IQ score available?: No Insight: Poor Impulse Control: Poor Appetite: Good Have you had any weight changes? : Gain Amount of the weight change? (lbs): 20 lbs Sleep: No Change Total Hours of Sleep: 8 Vegetative Symptoms: None  ADLScreening [facility] Assessment Services) Patient's cognitive ability adequate to safely complete daily activities?: Yes Patient able to express need for assistance with ADLs?: Yes Independently performs ADLs?: Yes (appropriate for developmental age)  Prior Inpatient Therapy Prior Inpatient Therapy: Yes Prior Therapy Dates: PAST Prior Therapy Facilty/Provider(s): [facility]  Reason for Treatment: Behaviors  Prior Outpatient Therapy Prior Outpatient Therapy: Yes Prior Therapy Dates: current Prior Therapy Facilty/Provider([facility]): [facility] Reason for  Treatment: Depression Does patient have an ACCT team?: No Does patient have Intensive In-House Services?  : No Does patient have [facility] services? : No Does patient have P4CC services?: No  ADL Screening (condition at time of admission) Patient's cognitive ability adequate to safely complete daily activities?: Yes Is the  patient deaf or have difficulty hearing?: No Does the patient have difficulty seeing, even when wearing glasses/contacts?: No Does the patient have difficulty concentrating, remembering, or making decisions?: No Patient able to express need for assistance with ADLs?: Yes Does the patient have difficulty dressing or bathing?: No Independently performs ADLs?: Yes (appropriate for developmental age) Does the patient have difficulty walking or climbing stairs?: No Weakness of Legs: None Weakness of Arms/Hands: None  Home Assistive Devices/Equipment Home Assistive Devices/Equipment: None  Therapy Consults (therapy consults require a physician order) PT Evaluation Needed: No OT Evalulation Needed: No SLP Evaluation Needed: No Abuse/Neglect Assessment (Assessment to be complete while patient is alone) Abuse/Neglect Assessment Can Be Completed: Yes Physical Abuse: Denies Verbal Abuse: Denies Sexual Abuse: Denies Exploitation of patient/patient's resources: Denies Self-Neglect: Denies Values / Beliefs Cultural Requests During Hospitalization: None Spiritual Requests During Hospitalization: None Consults Spiritual Care Consult Needed: No Social Work Consult Needed: No Merchant navy officer (For Healthcare) Does Patient Have a Medical Advance Directive?: No Would patient like information on creating a medical advance directive?: No - Patient declined          Disposition:  Disposition Initial Assessment Completed for this Encounter: Yes Disposition of Patient: Discharge Patient refused recommended treatment: No Mode of transportation if patient is discharged?: Car  On Site Evaluation by:   Reviewed with Physician:    Yann Biehn D Wilder Kurowski 03/24/2018 9:40 PM

## 2018-03-24 NOTE — ED Notes (Addendum)
Called mom Dawn Lambert legal guardian of pt and informed her of daughters visit here with us in ED. Mom aware and provided some background information. Mom states that she does wish for daughter to go back to group home upon discharge.

## 2018-03-24 NOTE — ED Notes (Signed)
Pt. Transferred to [facility] from ED to room 5 after screening for contraband. Report to include Situation, Background, Assessment and [facility]ations [facility]. Pt. Oriented to unit including Q15 minute rounds as well as the security cameras for their protection. Patient is alert, warm and dry in no acute distress. Patient states she has  SI, HI, and AVH. She states she wants to "stick things up my bottom and hurt the group home staff" as well as "voices telling her to go back to the group home and pull down her pants and seeing things". Pt. Encouraged to let me know if needs arise.

## 2018-03-24 NOTE — ED Notes (Signed)
Report to include Situation, Background, Assessment, and Recommendations [facility] from [facility]. Patient alert and oriented, warm and dry, in no acute distress. Patient said " I had a little bit too much energy and take off my pants. I will kill myself if I go back to the group home. I want to go to state hospital so they can change my medication". She reports that she hears voice and telling her when to go outside and not to go outside. Also reports VH. She denies HI and pain. Patient made aware of Q15 minute rounds and Psychologist, counsellingover and Officer presence for their safety. Patient instructed to come to me with needs or concerns.

## 2018-03-24 NOTE — BH Assessment (Signed)
Reviewed pt with [name], pt is safe to be d/c at this time. Spoke with the g/h and they do not have staff available tonight to pick up pt but reports that they will come to pick her up first thing in the morning.

## 2018-03-24 NOTE — ED Notes (Signed)
Pt belongings: sweater, olive colored pants, 1 pair of socks, 1 pair of tennis shoes, underwear, white sports bra, 1 purple smart watch in a cup.   Pt stated that she didn't have a cell phone or jewelry other than smart watch. Group home staff has pt belongings.

## 2018-03-25 NOTE — ED Notes (Addendum)
Called group home staff and they stated they would be here in about an hour to pick patient up

## 2018-03-25 NOTE — ED Notes (Signed)
Hourly rounding reveals patient sleeping in room. No complaints, stable, in no acute distress. Q15 minute rounds and monitoring via Security Cameras to continue. 

## 2018-03-25 NOTE — ED Notes (Addendum)
Attempted to call Cristela BlueRenee Lemming [phone] and left a HIPPA compliant message to call us about patient.

## 2018-03-25 NOTE — ED Notes (Signed)
Returned call from [employer]on and informed of patients discharge

## 2018-03-25 NOTE — ED Notes (Signed)
Hourly rounding reveals patient in room. No complaints, stable, in no acute distress. Q15 minute rounds and monitoring via Security Cameras to continue. 

## 2018-03-25 NOTE — ED Provider Notes (Signed)
-----------------------------------------   3:09 AM on 03/25/2018 -----------------------------------------   Blood pressure (!) 146/80, pulse 78, temperature 98.1 F (36.7 C), temperature source Oral, resp. rate 18, height 1.6 m (5\' 3" ), weight 132.9 kg, SpO2 100 %.  The patient had no acute events since last update.  The patient is discharged, but her ride cannot pick her up until the morning.   Loleta RoseForbach, Shalom Ware, MD 03/25/18 [phone]

## 2018-03-25 NOTE — ED Notes (Signed)
Patient discharged back to group home with group home caregiver Rogelio Seenosa Foust, patient and staff received discharge papers. Patient received belongings and verbalized she has received all of her belongings. Patient appropriate and cooperative, Denies SI/HI AVH. Vital signs taken. NAD noted.

## 2018-06-02 ENCOUNTER — Other Ambulatory Visit: Payer: Self-pay

## 2018-06-02 ENCOUNTER — Encounter: Payer: Self-pay | Admitting: Emergency Medicine

## 2018-06-02 ENCOUNTER — Emergency Department
Admission: EM | Admit: 2018-06-02 | Discharge: 2018-06-03 | Disposition: A | Discharge status: Home / Self Care | Payer: Medicaid Other | Attending: Emergency Medicine | Admitting: Emergency Medicine

## 2018-06-02 DIAGNOSIS — E039 Hypothyroidism, unspecified: Secondary | ICD-10-CM | POA: Diagnosis not present

## 2018-06-02 DIAGNOSIS — F4325 Adjustment disorder with mixed disturbance of emotions and conduct: Secondary | ICD-10-CM | POA: Diagnosis not present

## 2018-06-02 DIAGNOSIS — E119 Type 2 diabetes mellitus without complications: Secondary | ICD-10-CM | POA: Diagnosis not present

## 2018-06-02 DIAGNOSIS — F3340 Major depressive disorder, recurrent, in remission, unspecified: Secondary | ICD-10-CM | POA: Diagnosis not present

## 2018-06-02 DIAGNOSIS — R45851 Suicidal ideations: Secondary | ICD-10-CM

## 2018-06-02 DIAGNOSIS — I10 Essential (primary) hypertension: Secondary | ICD-10-CM | POA: Diagnosis not present

## 2018-06-02 DIAGNOSIS — R45850 Homicidal ideations: Secondary | ICD-10-CM

## 2018-06-02 DIAGNOSIS — F79 Unspecified intellectual disabilities: Secondary | ICD-10-CM | POA: Diagnosis not present

## 2018-06-02 DIAGNOSIS — Z046 Encounter for general psychiatric examination, requested by authority: Secondary | ICD-10-CM | POA: Diagnosis present

## 2018-06-02 LAB — COMPREHENSIVE METABOLIC PANEL
ALT: 19 U/L (ref 0–44)
AST: 18 U/L (ref 15–41)
Albumin: 3.8 g/dL (ref 3.5–5.0)
Alkaline Phosphatase: 75 U/L (ref 38–126)
Anion gap: 7 (ref 5–15)
BUN: 9 mg/dL (ref 6–20)
CO2: 25 mmol/L (ref 22–32)
Calcium: 9.3 mg/dL (ref 8.9–10.3)
Chloride: 106 mmol/L (ref 98–111)
Creatinine, Ser: 0.75 mg/dL (ref 0.44–1.00)
GFR calc Af Amer: >60 mL/min (ref >60)
GFR calc non Af Amer: >60 mL/min (ref >60)
Glucose, Bld: 149 mg/dL — ABNORMAL HIGH (ref 70–99)
Potassium: 3.9 mmol/L (ref 3.5–5.1)
Sodium: 138 mmol/L (ref 135–145)
Total Bilirubin: 0.3 mg/dL (ref 0.3–1.2)
Total Protein: 7.9 g/dL (ref 6.5–8.1)

## 2018-06-02 LAB — CBC
HCT: 36 % (ref 36.0–46.0)
Hemoglobin: 11.4 g/dL — ABNORMAL LOW (ref 12.0–15.0)
MCH: 28.4 pg (ref 26.0–34.0)
MCHC: 31.7 g/dL (ref 30.0–36.0)
MCV: 89.6 fL (ref 80.0–100.0)
Platelets: 272 10*3/uL (ref 150–400)
RBC: 4.02 MIL/uL (ref 3.87–5.11)
RDW: 13 % (ref 11.5–15.5)
WBC: 10.6 10*3/uL — ABNORMAL HIGH (ref 4.0–10.5)
nRBC: 0 % (ref 0.0–0.2)

## 2018-06-02 LAB — ACETAMINOPHEN LEVEL: Acetaminophen (Tylenol), Serum: <10 ug/mL — ABNORMAL LOW (ref 10–30)

## 2018-06-02 LAB — ETHANOL: Alcohol, Ethyl (B): <10 mg/dL (ref <10)

## 2018-06-02 LAB — SALICYLATE LEVEL: Salicylate Lvl: <7 mg/dL (ref 2.8–30.0)

## 2018-06-02 NOTE — ED Triage Notes (Signed)
Pt to ED with caregiver from [facility] group home with c/o SI and HI. Pt states that "I am going to kill the manager". Pt is calm and cooperative in triage at this time. Pt is upset because she was not able to go to the [facility] her [facility] due to the distance. Pt voiced several times in triage that she wanted to kill the manager of the group home, [facility] [facility].

## 2018-06-02 NOTE — ED Notes (Signed)
Hourly rounding reveals patient in room. No complaints, stable, in no acute distress. Q15 minute rounds and monitoring via Security Cameras to continue. 

## 2018-06-02 NOTE — ED Notes (Signed)
Hourly rounding reveals patient sleeping in room. No complaints, stable, in no acute distress. Q15 minute rounds and monitoring via Security Cameras to continue. 

## 2018-06-02 NOTE — ED Notes (Signed)
Pt reports wanting to "hurt my group home leader with my blades and myself".  Mother (legal guardian) also returned call and RN informed her that patient is here.  Mother informed RN that when released she would like pt to be returned to group home.

## 2018-06-02 NOTE — ED Notes (Signed)
Pt sitting on bed eating crackers.

## 2018-06-02 NOTE — ED Provider Notes (Signed)
[facility] Emergency Department Provider Note  ____________________________________________  Time seen: Approximately 3:40 PM  I have reviewed the triage vital signs and the nursing notes.   HISTORY  Chief Complaint Homicidal    HPI Dawn Lambert is a 28 y.o. female with a history of MR, mood disorder, living in a group home presenting for SI and HI.  The patient reports that this morning she could not go to the church that she wanted to attend, and developed thoughts of cutting herself, and cutting the house manager with a goal of killing that person.  She states she still having those feelings.  She denies any hallucinations.  She has not had any medical complaints at this time.  Her only recent medication change is an increase in her Ativan as needed dosage to 2 mg approximately 1 month ago.  According to her caretaker from the group home who accompanies her today, she did try to take a 2 mg Ativan at 10 AM, which did not help.  Past Medical History:  Diagnosis Date  . Anxiety   . Arnold-Chiari malformation (HCC)   . Atrial septal defect   . Complication of anesthesia    when pt was 11, woke up during surgery (muscle biopsy)  . Constipation   . Contraception management   . Dandruff   . Dermatomyositis (HCC)    age 58  . Diabetes mellitus without complication (HCC)   . Elevated blood pressure   . Hypertension   . Hypothyroidism   . Mental retardation   . Mood disorder (HCC)   . Overweight(278.02)   . Pelvic pain   . Prediabetes     Patient Active Problem List   Diagnosis Date Noted  . 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 Intellectual disability 10/12/2017  . Umbilical hernia without obstruction and without gangrene 08/15/2017  . Chest pain 11/05/2012  . Atrial septal defect 09/19/2011    Past Surgical History:  Procedure Laterality Date  . ASD REPAIR     at age 35  . UMBILICAL HERNIA REPAIR  N/A 09/17/2017   Procedure: HERNIA REPAIR UMBILICAL ADULT;  Surgeon: Earline Mayotte, MD;  Location: [facility] ORS;  Service: General;  Laterality: N/A;    Current Outpatient Rx  . Order #: [id number] Class: Historical Med  . Order #: [id number] Class: Historical Med  . Order #: [id number] Class: Historical Med  . Order #: [id number] Class: Historical Med  . Order #: [id number] Class: Historical Med  . Order #: [id number] Class: Historical Med  . Order #: [id number] Class: Historical Med  . Order #: [id number] Class: Historical Med  . Order #: [id number] Class: Historical Med  . Order #: [id number] Class: Historical Med  . Order #: [id number] Class: Historical Med  . Order #: [id number] Class: Historical Med  . Order #: [id number] Class: Historical Med  . Order #: [id number] Class: Historical Med  . Order #: [id number] Class: Historical Med  . Order #: [id number] Class: Historical Med  . Order #: [id number] Class: Historical Med  . Order #: [id number] Class: Historical Med  . Order #: [id number] Class: Historical Med  . Order #: [id number] Class: Historical Med  . Order #: [id number] Class: Historical Med  . Order #: [id number] Class: Historical Med  . Order #: [id number] Class: Historical Med    Allergies Augmentin [amoxicillin-pot clavulanate]  No family history on file.  Social History Social History   Tobacco Use  . Smoking status: Never Smoker  . Smokeless tobacco: Never Used  Substance Use Topics  . Alcohol use: No  . Drug  use: No    Review of Systems Constitutional: No fever/chills. Eyes: No visual changes. ENT: No sore throat. No congestion or rhinorrhea. Cardiovascular: Denies chest pain. Denies palpitations. Respiratory: Denies shortness of breath.  No cough. Gastrointestinal: No abdominal pain.  No nausea, no vomiting.  No diarrhea.  No constipation. Genitourinary: Negative for dysuria. Musculoskeletal: Negative for back pain. Skin: Negative for rash. Neurological: Negative for headaches. No focal  numbness, tingling or weakness.  Psychiatric:Positive SI and HI with plan.  No hallucinations.    ____________________________________________   PHYSICAL EXAM:  VITAL SIGNS: ED Triage Vitals  Enc Vitals Group     BP 06/02/18 1339 (!) 140/113     Pulse Rate 06/02/18 1339 76     Resp 06/02/18 1339 16     Temp 06/02/18 1339 98.6 F (37 C)     Temp Source 06/02/18 1339 Oral     SpO2 06/02/18 1339 98 %     Weight --      Height --      Head Circumference --      Peak Flow --      Pain Score 06/02/18 1341 0     Pain Loc --      Pain Edu? --      Excl. in GC? --     Constitutional: The patient is alert and oriented.  She does have a speech pattern that is consistent with her intellectual disability.  She is calm and cooperative on exam. Eyes: Conjunctivae are normal.  EOMI. No scleral icterus. Head: Atraumatic. Nose: No congestion/rhinnorhea. Mouth/Throat: Mucous membranes are moist.  Neck: No stridor.  Supple.  No meningismus. Cardiovascular: Normal rate, regular rhythm. No murmurs, rubs or gallops.  Respiratory: Normal respiratory effort.  No accessory muscle use or retractions. Lungs CTAB.  No wheezes, rales or ronchi. Gastrointestinal: Obese.   Musculoskeletal: Moves all extremities well. Neurologic:  A&Ox3.  Speech is clear.  Face and smile are symmetric.  EOMI.  Moves all extremities well. Skin:  Skin is warm, dry and intact. No rash noted. Psychiatric: The patient has a depressed mood and affect.  She does not make good eye contact.  Her speech pattern is clear; she does not have pressured speech.  She has some insight into why she is here.  She continues to endorse SI, HI with plan.  She denies hallucinations.  ____________________________________________   LABS (all labs ordered are listed, but only abnormal results are displayed)  Labs Reviewed  COMPREHENSIVE METABOLIC PANEL - Abnormal; Notable for the following components:      Result Value   Glucose, Bld 149  (*)    All other components within normal limits  ACETAMINOPHEN LEVEL - Abnormal; Notable for the following components:   Acetaminophen (Tylenol), Serum <10 (*)    All other components within normal limits  CBC - Abnormal; Notable for the following components:   WBC 10.6 (*)    Hemoglobin 11.4 (*)    All other components within normal limits  ETHANOL  SALICYLATE LEVEL  URINE DRUG SCREEN, QUALITATIVE ([facility] ONLY)  POC URINE PREG, ED   ____________________________________________  EKG   Not indicated ____________________________________________  RADIOLOGY  No results found.  ____________________________________________   PROCEDURES  Procedure(s) performed: None  Procedures  Critical Care performed: No ____________________________________________   INITIAL IMPRESSION / ASSESSMENT AND PLAN / ED COURSE  Pertinent labs & imaging results that were available during my care of the patient were reviewed by me and considered in my medical decision making (  see chart for details).  28 y.o. female with a history of mental retardation and mood disorder presenting for SI and HI, which did not improve with as needed Ativan.  Overall, the patient is mildly hypertensive but we will recheck this.  She has no medical complaints.  Her laboratory studies are reassuring.  She has no evidence of injury or self-harm.  At this time, the patient has been placed under involuntary commitment, and will be evaluated by psychiatrist for final disposition.  She is safe to move to the behavioral health unit.  ____________________________________________  FINAL CLINICAL IMPRESSION(S) / ED DIAGNOSES  Final diagnoses:  Planning to commit suicide  Homicidal ideation         NEW MEDICATIONS STARTED DURING THIS VISIT:  New Prescriptions   No medications on file      Rockne MenghiniNorman, Anne-Caroline, MD 06/02/18 [phone]

## 2018-06-02 NOTE — ED Notes (Signed)
Pt finished talking with [facility]

## 2018-06-02 NOTE — ED Notes (Signed)
Group home employer that is with the pt expressed that she knows when the pt comes here she actively seeks narcotics and any kind of medication she can get. The employer states she knows the pt isn't going to kill herself and uses this type of behavior to get what she wants. She wanted [name] to be aware about the medication seeking behavior.   Lm edt

## 2018-06-02 NOTE — ED Notes (Signed)
Report to include Situation, Background, Assessment, and Recommendations received from Rhea RN. Patient alert and oriented, warm and dry, in no acute distress. Patient denies SI, HI, AVH and pain. Patient made aware of Q15 minute rounds and security cameras for their safety. Patient instructed to come to me with needs or concerns. 

## 2018-06-02 NOTE — BH Assessment (Signed)
Tele Assessment Note   Patient Name: Dawn Lambert MRN: [id number] Referring Physician:  Location of Patient:  Location of Provider: Behavioral Health TTS Department  Dawn Lambert is an 28 y.o. female.  Pt was brought to ED by BPD due to being found outside naked as she was upset she did not get to attend her choice of church this morning; Pt wanted to attend a church that allows her to do testimonies and play instruments, but the Group Home Manager would not allow her which caused the patient to get upset and make threats towards herself and the group home manager; Pt currently denies SI/HI; Denies A/V hallucinations; pt stated she did not have a plan as to how she would cause harm to the group home manager; pt denies substance use; [facility]; pt would like to return home as she has calmed down and would like to sleep in her own bed; pt admitted she should not have made the statements towards the group home manager;   Diagnosis:  Axis I: Mood Disorder Axis II: deferred Axis III: see medial notes Axis IV: mental health access    Past Medical History:  Past Medical History:  Diagnosis Date  . Anxiety   . Arnold-Chiari malformation (HCC)   . Atrial septal defect   . Complication of anesthesia    when pt was 11, woke up during surgery (muscle biopsy)  . Constipation   . Contraception management   . Dandruff   . Dermatomyositis (HCC)    age 89  . Diabetes mellitus without complication (HCC)   . Elevated blood pressure   . Hypertension   . Hypothyroidism   . Mental retardation   . Mood disorder (HCC)   . Overweight(278.02)   . Pelvic pain   . Prediabetes     Past Surgical History:  Procedure Laterality Date  . ASD REPAIR     at age 89  . UMBILICAL HERNIA REPAIR N/A 09/17/2017   Procedure: HERNIA REPAIR UMBILICAL ADULT;  Surgeon: [name], [name], MD;  Location: [facility] ORS;  Service: General;  Laterality: N/A;    Family  History: No family history on file.  Social History:  reports that she has never smoked. She has never used smokeless tobacco. She reports that she does not drink alcohol or use drugs.  Additional Social History:     CIWA: CIWA-Ar BP: (!) 140/113 Pulse Rate: 76 COWS:    Allergies:  Allergies  Allergen Reactions  . Augmentin [Amoxicillin-Pot Clavulanate] Other (See Comments)    Has patient had a PCN reaction causing immediate rash, facial/tongue/throat swelling, SOB or lightheadedness with hypotension: ______ Has patient had a PCN reaction causing severe rash involving mucus membranes or skin necrosis: _______ Has patient had a PCN reaction that required hospitalization: _______ Has patient had a PCN reaction occurring within the last 10 years:_______ If all of the above answers are "NO", then may proceed with Cephalosporin use.     Home Medications: (Not in a hospital admission)   OB/GYN Status:  No LMP recorded. Patient has had an injection.  General Assessment Data [facility] Assessment: In system Is this a Tele or Face-to-Face Assessment?: Face-to-Face Is this an Initial Assessment or a Re-assessment for this encounter?: Initial Assessment Patient Accompanied by:: N/A Language Other than English: No Living Arrangements: In Group Home: (Comment: Name of Group Home) What gender do you identify as?: Female Marital status: Single Pregnancy Status:  No Living Arrangements: Group Home Can pt return to current living arrangement?: Yes Admission Status: Involuntary Petitioner: Police Is patient capable of signing voluntary admission?: No Referral Source: Self/Family/Friend     Crisis Care Plan Living Arrangements: Group Home Legal Guardian: Mother Name of Therapist: [facility]     Risk to self with the past 6 months Suicidal Ideation: No-Not Currently/Within Last 6 Months Has patient been a risk to self within the past 6 months prior to  admission? : Yes(pt was threatening to harm self earlier) Suicidal Intent: No-Not Currently/Within Last 6 Months Has patient had any suicidal intent within the past 6 months prior to admission? : Yes Is patient at risk for suicide?: Yes Suicidal Plan?: No-Not Currently/Within Last 6 Months Has patient had any suicidal plan within the past 6 months prior to admission? : No Access to Means: Yes Specify Access to Suicidal Means: household items What has been your use of drugs/alcohol within the last 12 months?: none Previous Attempts/Gestures: Yes How many times?: 2 Other Self Harm Risks: none Triggers for Past Attempts: Unpredictable(when patient gets upset) Intentional Self Injurious Behavior: None Family Suicide History: Unknown Recent stressful life event(s): Conflict (Comment)(argument with group home manager) Persecutory voices/beliefs?: No Depression: No Substance abuse history and/or treatment for substance abuse?: No Suicide prevention information given to non-admitted patients: Not applicable  Risk to Others within the past 6 months Homicidal Ideation: No-Not Currently/Within Last 6 Months Does patient have any lifetime risk of violence toward others beyond the six months prior to admission? : No Thoughts of Harm to Others: No-Not Currently Present/Within Last 6 Months Current Homicidal Intent: No-Not Currently/Within Last 6 Months Current Homicidal Plan: No-Not Currently/Within Last 6 Months(no current plan but did verbalize harm to Group Home Manager) Access to Homicidal Means: Yes Describe Access to Homicidal Means: household items Identified Victim: group Home Manager History of harm to others?: No Assessment of Violence: None Noted Violent Behavior Description: cooperative Does patient have access to weapons?: No Criminal Charges Pending?: No Does patient have a court date: No Is patient on probation?: Unknown  Psychosis Hallucinations: None noted Delusions: None  noted  Mental Status Report Appearance/Hygiene: Unremarkable Eye Contact: Good Motor Activity: Freedom of movement Speech: Logical/coherent Level of Consciousness: Alert Mood: Anxious Affect: Appropriate to circumstance Anxiety Level: None Thought Processes: Coherent, Relevant Judgement: Impaired Orientation: Person, Time, Place, Situation Obsessive Compulsive Thoughts/Behaviors: None  Cognitive Functioning Concentration: Normal Memory: Recent Intact, Remote Intact Is patient IDD: Yes Insight: Poor Impulse Control: Poor Appetite: Good Have you had any weight changes? : Gain Amount of the weight change? (lbs): 10 lbs Sleep: No Change Total Hours of Sleep: 8 Vegetative Symptoms: None  ADLScreening [facility] Assessment Services) Patient's cognitive ability adequate to safely complete daily activities?: Yes Patient able to express need for assistance with ADLs?: Yes Independently performs ADLs?: Yes (appropriate for developmental age)  Prior Inpatient Therapy Prior Inpatient Therapy: Yes(several places in past) Prior Therapy Dates: over years Prior Therapy Facilty/Provider(s): various Reason for Treatment: mental health  Prior Outpatient Therapy Prior Outpatient Therapy: Yes Prior Therapy Dates: current Prior Therapy Facilty/Provider(s): [facility] Reason for Treatment: mental health Does patient have an ACCT team?: No Does patient have Intensive In-House Services?  : No Does patient have [facility] services? : No Does patient have P4CC services?: No  ADL Screening (condition at time of admission) Patient's cognitive ability adequate to safely complete daily activities?: Yes Patient able to express need for assistance with ADLs?: Yes Independently performs ADLs?: Yes (appropriate  for developmental age)             Merchant navy officer (For Healthcare) Does Patient Have a Medical Advance Directive?: No Would patient like information on creating a medical advance  directive?: No - Patient declined          Disposition:  Disposition Initial Assessment Completed for this Encounter: Yes     Jinny Sanders, Sanjana Folz [place] 06/02/2018 5:30 PM

## 2018-06-03 NOTE — ED Notes (Signed)
Hourly rounding reveals patient sleeping in room. No complaints, stable, in no acute distress. Q15 minute rounds and monitoring via Security Cameras to continue. 

## 2018-06-03 NOTE — Discharge Instructions (Addendum)
These return to the emergency department if you develop thoughts of hurting yourself or anyone else, hallucinations, or any other symptoms concerning to you.

## 2018-06-03 NOTE — ED Notes (Signed)
Hourly rounding reveals patient in room. No complaints, stable, in no acute distress. Q15 minute rounds and monitoring via Security Cameras to continue. 

## 2018-06-03 NOTE — ED Notes (Signed)
Attempted to call [facility] to pick up patient but after multiple rings went to message box that states it is full and unable to accept another message.

## 2018-06-29 ENCOUNTER — Other Ambulatory Visit: Payer: Self-pay

## 2018-06-29 ENCOUNTER — Encounter: Payer: Self-pay | Admitting: Emergency Medicine

## 2018-06-29 ENCOUNTER — Emergency Department
Admission: EM | Admit: 2018-06-29 | Discharge: 2018-06-30 | Disposition: A | Discharge status: Home / Self Care | Payer: Medicaid Other | Attending: Emergency Medicine | Admitting: Emergency Medicine

## 2018-06-29 DIAGNOSIS — F79 Unspecified intellectual disabilities: Secondary | ICD-10-CM | POA: Diagnosis not present

## 2018-06-29 DIAGNOSIS — R45851 Suicidal ideations: Secondary | ICD-10-CM | POA: Diagnosis not present

## 2018-06-29 DIAGNOSIS — I10 Essential (primary) hypertension: Secondary | ICD-10-CM | POA: Insufficient documentation

## 2018-06-29 DIAGNOSIS — F341 Dysthymic disorder: Secondary | ICD-10-CM

## 2018-06-29 DIAGNOSIS — F329 Major depressive disorder, single episode, unspecified: Secondary | ICD-10-CM | POA: Insufficient documentation

## 2018-06-29 DIAGNOSIS — Z79899 Other long term (current) drug therapy: Secondary | ICD-10-CM | POA: Diagnosis not present

## 2018-06-29 DIAGNOSIS — E119 Type 2 diabetes mellitus without complications: Secondary | ICD-10-CM | POA: Diagnosis not present

## 2018-06-29 DIAGNOSIS — E039 Hypothyroidism, unspecified: Secondary | ICD-10-CM | POA: Insufficient documentation

## 2018-06-29 LAB — URINE DRUG SCREEN, QUALITATIVE (ARMC ONLY)
Amphetamines, Ur Screen: NOT DETECTED
Barbiturates, Ur Screen: NOT DETECTED
Benzodiazepine, Ur Scrn: POSITIVE — AB
Cannabinoid 50 Ng, Ur ~~LOC~~: NOT DETECTED
Cocaine Metabolite,Ur ~~LOC~~: NOT DETECTED
MDMA (Ecstasy)Ur Screen: NOT DETECTED
Methadone Scn, Ur: NOT DETECTED
Opiate, Ur Screen: NOT DETECTED
Phencyclidine (PCP) Ur S: NOT DETECTED
Tricyclic, Ur Screen: NOT DETECTED

## 2018-06-29 LAB — COMPREHENSIVE METABOLIC PANEL
ALT: 24 U/L (ref 0–44)
AST: 22 U/L (ref 15–41)
Albumin: 3.9 g/dL (ref 3.5–5.0)
Alkaline Phosphatase: 76 U/L (ref 38–126)
Anion gap: 8 (ref 5–15)
BUN: 11 mg/dL (ref 6–20)
CO2: 25 mmol/L (ref 22–32)
Calcium: 9 mg/dL (ref 8.9–10.3)
Chloride: 104 mmol/L (ref 98–111)
Creatinine, Ser: 0.7 mg/dL (ref 0.44–1.00)
GFR calc Af Amer: >60 mL/min (ref >60)
GFR calc non Af Amer: >60 mL/min (ref >60)
Glucose, Bld: 174 mg/dL — ABNORMAL HIGH (ref 70–99)
Potassium: 3.8 mmol/L (ref 3.5–5.1)
Sodium: 137 mmol/L (ref 135–145)
Total Bilirubin: 0.2 mg/dL — ABNORMAL LOW (ref 0.3–1.2)
Total Protein: 7.8 g/dL (ref 6.5–8.1)

## 2018-06-29 LAB — CBC
HCT: 37 % (ref 36.0–46.0)
Hemoglobin: 11.8 g/dL — ABNORMAL LOW (ref 12.0–15.0)
MCH: 28.3 pg (ref 26.0–34.0)
MCHC: 31.9 g/dL (ref 30.0–36.0)
MCV: 88.7 fL (ref 80.0–100.0)
Platelets: 275 10*3/uL (ref 150–400)
RBC: 4.17 MIL/uL (ref 3.87–5.11)
RDW: 12.7 % (ref 11.5–15.5)
WBC: 11.5 10*3/uL — ABNORMAL HIGH (ref 4.0–10.5)
nRBC: 0 % (ref 0.0–0.2)

## 2018-06-29 LAB — ACETAMINOPHEN LEVEL: Acetaminophen (Tylenol), Serum: <10 ug/mL — ABNORMAL LOW (ref 10–30)

## 2018-06-29 LAB — ETHANOL: Alcohol, Ethyl (B): <10 mg/dL (ref <10)

## 2018-06-29 LAB — SALICYLATE LEVEL: Salicylate Lvl: <7 mg/dL (ref 2.8–30.0)

## 2018-06-29 NOTE — ED Notes (Signed)
SOC called and report given, SOC machine set up in patients room.

## 2018-06-29 NOTE — ED Notes (Signed)
Pt. Oriented to unit, pt. Advised of cameras and safety checks.  Pt. Requested and was given meal tray and drink.   Pt. Calm and cooperative at this time.

## 2018-06-29 NOTE — ED Notes (Signed)
Pt walked back to room with urine cup in hand.  Pt went to the bathroom and did not give a sample because "she didn't know we wanted one".  Pt advised next time she uses the bathroom, to please give a sample

## 2018-06-29 NOTE — ED Notes (Signed)
[facility] doctor called back and recommends discharge.

## 2018-06-29 NOTE — ED Notes (Signed)
Caretaker of patient called to let us know when patient was being discharged call "Cherre Huger" @ [phone] to provide transportation back to group home.

## 2018-06-29 NOTE — BH Assessment (Signed)
Assessment Note  Dawn Lambert is an 28 y.o. female who presents to the ER due to laying in the street, with the intentions of getting hit by a car because she do not like her Group Home. Patient further reports, she spoke with her mother about the Group Home and the possibility of moving but her mother told her, staying at the Group Home was the best thing for her. Patient have been in the same Group Home for approximately ten years.  During the interview, the patient was calm, cooperative and pleasant. She was able to provide appropriate answers to the questions. However, this writer have limited information about what took place at the Group Home. At the time of this assessment, Clinical research associate hadn't spoken with the patient's mother or any Group Home staff members. Patient denies HI and AV/H.  Diagnosis: Depression  Past Medical History:  Past Medical History:  Diagnosis Date  . Anxiety   . Arnold-Chiari malformation (HCC)   . Atrial septal defect   . Complication of anesthesia    when pt was 11, woke up during surgery (muscle biopsy)  . Constipation   . Contraception management   . Dandruff   . Dermatomyositis (HCC)    age 21  . Diabetes mellitus without complication (HCC)   . Elevated blood pressure   . Hypertension   . Hypothyroidism   . Mental retardation   . Mood disorder (HCC)   . Overweight(278.02)   . Pelvic pain   . Prediabetes     Past Surgical History:  Procedure Laterality Date  . ASD REPAIR     at age 51  . UMBILICAL HERNIA REPAIR N/A 09/17/2017   Procedure: HERNIA REPAIR UMBILICAL ADULT;  Surgeon: Earline Mayotte, MD;  Location: [facility] ORS;  Service: General;  Laterality: N/A;    Family History: History reviewed. No pertinent family history.  Social History:  reports that she has never smoked. She has never used smokeless tobacco. She reports that she does not drink alcohol or use drugs.  Additional Social History:  Alcohol / Drug Use Pain Medications: See  PTA Prescriptions: See PTA Over the Counter: See PTA History of alcohol / drug use?: No history of alcohol / drug abuse Longest period of sobriety (when/how long): None reported  Negative Consequences of Use: (n/a) Withdrawal Symptoms: (n/a)  CIWA: CIWA-Ar BP: (!) 146/89 Pulse Rate: 74 COWS:    Allergies:  Allergies  Allergen Reactions  . Augmentin [Amoxicillin-Pot Clavulanate] Other (See Comments)    Has patient had a PCN reaction causing immediate rash, facial/tongue/throat swelling, SOB or lightheadedness with hypotension: ______ Has patient had a PCN reaction causing severe rash involving mucus membranes or skin necrosis: _______ Has patient had a PCN reaction that required hospitalization: _______ Has patient had a PCN reaction occurring within the last 10 years:_______ If all of the above answers are "NO", then may proceed with Cephalosporin use.     Home Medications: (Not in a hospital admission)   OB/GYN Status:  Patient's last menstrual period was 06/29/2018.  General Assessment Data Location of Assessment: [facility] ED TTS Assessment: In system Is this a Tele or Face-to-Face Assessment?: Face-to-Face Is this an Initial Assessment or a Re-assessment for this encounter?: Initial Assessment Patient Accompanied by:: N/A Language Other than English: No Living Arrangements: In Group Home: (Comment: Name of Group Home) What gender do you identify as?: Female Marital status: Single Pregnancy Status: No Living Arrangements: Group Home Can pt return to current living arrangement?: Yes Admission  Status: Involuntary Petitioner: ED Attending Is patient capable of signing voluntary admission?: No(Under IVC) Referral Source: Self/Family/Friend Insurance type: MCD  Medical Screening Exam [facility] Walk-in ONLY) Medical Exam completed: Yes  Crisis Care Plan Living Arrangements: Group Home Legal Guardian: Mother(Renee [phone]) Name of Psychiatrist: Unknown Name of  Therapist: [facility](Per previous assessment)  Education Status Is patient currently in school?: No Is the patient employed, unemployed or receiving disability?: Unemployed, Receiving disability income  Risk to self with the past 6 months Suicidal Ideation: Yes-Currently Present Has patient been a risk to self within the past 6 months prior to admission? : Yes Suicidal Intent: Yes-Currently Present Has patient had any suicidal intent within the past 6 months prior to admission? : Yes Is patient at risk for suicide?: No Suicidal Plan?: Yes-Currently Present Has patient had any suicidal plan within the past 6 months prior to admission? : Yes Specify Current Suicidal Plan: Laid in the road Access to Means: Yes Specify Access to Suicidal Means: Road What has been your use of drugs/alcohol within the last 12 months?: Reports of none Previous Attempts/Gestures: No How many times?: 0 Other Self Harm Risks: Reports of none Triggers for Past Attempts: Other (Comment)(Don't like Group Home) Intentional Self Injurious Behavior: Bruising Comment - Self Injurious Behavior: Bruise Wrist Family Suicide History: Unknown Recent stressful life event(s): Other (Comment)(Don't like Group Home) Persecutory voices/beliefs?: No Depression: Yes Depression Symptoms: Tearfulness, Guilt, Feeling angry/irritable Substance abuse history and/or treatment for substance abuse?: No Suicide prevention information given to non-admitted patients: Not applicable  Risk to Others within the past 6 months Homicidal Ideation: No Does patient have any lifetime risk of violence toward others beyond the six months prior to admission? : No Thoughts of Harm to Others: No Current Homicidal Intent: No Current Homicidal Plan: No Access to Homicidal Means: No Describe Access to Homicidal Means: Reports of none Identified Victim: Reports of none History of harm to others?: No Assessment of Violence: None Noted Violent  Behavior Description: Reports of none Does patient have access to weapons?: No Criminal Charges Pending?: No Does patient have a court date: No Is patient on probation?: No  Psychosis Hallucinations: None noted Delusions: None noted  Mental Status Report Appearance/Hygiene: Unremarkable, In scrubs Eye Contact: Good Motor Activity: Freedom of movement, Unremarkable Speech: Logical/coherent, Unremarkable Level of Consciousness: Alert Mood: Pleasant, Helpless Affect: Appropriate to circumstance, Sad Anxiety Level: Minimal Thought Processes: Coherent, Relevant Judgement: Unimpaired Orientation: Person, Place, Time, Situation, Appropriate for developmental age Obsessive Compulsive Thoughts/Behaviors: None  Cognitive Functioning Concentration: Normal Memory: Recent Intact, Remote Intact Is patient IDD: (Unknown) Insight: Poor Impulse Control: Fair Appetite: Good Have you had any weight changes? : No Change Sleep: No Change Total Hours of Sleep: 8 Vegetative Symptoms: None  ADLScreening [facility] Assessment Services) Patient's cognitive ability adequate to safely complete daily activities?: Yes Patient able to express need for assistance with ADLs?: Yes Independently performs ADLs?: Yes (appropriate for developmental age)  Prior Inpatient Therapy Prior Inpatient Therapy: Yes Prior Therapy Dates: over years Prior Therapy Facilty/Provider(s): various Reason for Treatment: mental health  Prior Outpatient Therapy Prior Outpatient Therapy: Yes Prior Therapy Dates: current Prior Therapy Facilty/Provider(s): [facility] Reason for Treatment: mental health Does patient have an ACCT team?: No Does patient have Intensive In-House Services?  : No Does patient have [facility] services? : No Does patient have P4CC services?: No  ADL Screening (condition at time of admission) Patient's cognitive ability adequate to safely complete daily activities?: Yes Is the patient deaf or have  difficulty hearing?: No Does the patient have difficulty seeing, even when wearing glasses/contacts?: No Does the patient have difficulty concentrating, remembering, or making decisions?: No Patient able to express need for assistance with ADLs?: Yes Does the patient have difficulty dressing or bathing?: No Independently performs ADLs?: Yes (appropriate for developmental age) Does the patient have difficulty walking or climbing stairs?: No Weakness of Legs: None Weakness of Arms/Hands: None  Home Assistive Devices/Equipment Home Assistive Devices/Equipment: None  Therapy Consults (therapy consults require a physician order) PT Evaluation Needed: No OT Evalulation Needed: No SLP Evaluation Needed: No Abuse/Neglect Assessment (Assessment to be complete while patient is alone) Abuse/Neglect Assessment Can Be Completed: Yes Physical Abuse: Denies Verbal Abuse: Denies Sexual Abuse: Denies Exploitation of patient/patient's resources: Denies Self-Neglect: Denies Values / Beliefs Cultural Requests During Hospitalization: None Spiritual Requests During Hospitalization: None Consults Spiritual Care Consult Needed: No Social Work Consult Needed: No         Child/Adolescent Assessment Running Away Risk: Denies(Patient is an adult)  Disposition:  Disposition Initial Assessment Completed for this Encounter: Yes  On Site Evaluation by:   Reviewed with Physician:    Lilyan Gilford MS, LCAS, LPMHCA, NCC, CCSI Therapeutic Triage Specialist 06/29/2018 7:35 PM

## 2018-06-29 NOTE — ED Notes (Signed)
Patient assigned to appropriate care area   Introduced self to pt  Patient oriented to unit/care area: Informed that, for their safety, care areas are designed for safety and visiting and phone hours explained to patient. Patient verbalizes understanding, and verbal contract for safety obtained  Environment secured    Pt said she wants to kill herself if she cant leave her group home.  Pt states she wants to go to a group home that is locked where they can look in and see her but she is locked in where she can't get out.  Pt states if she can't leave she will kill herself. Patient said I have been there 10 years and still having these problems and you all arent doing anything about it.

## 2018-06-29 NOTE — BH Assessment (Signed)
Writer called and left a HIPPA Compliant message with mother/guardian (Renee [phone] ), requesting a return phone call.

## 2018-06-29 NOTE — ED Triage Notes (Signed)
Pt to ed with c/o wanting to kill herself if she cant leave her group home.  Pt states she wants to go to a group home that is locked where they can look in and see her but she is locked in where she can't get out.  Pt states if she can't leave she will kill herself.

## 2018-06-29 NOTE — ED Notes (Signed)
TTS talking with patient

## 2018-06-30 MED ORDER — LEVOTHYROXINE SODIUM 75 MCG PO TABS
175.0000 ug | ORAL_TABLET | Freq: Every day | ORAL | Status: DC
  Administered 2018-06-30: 175 ug via ORAL
  Filled 2018-06-30: qty 1

## 2018-06-30 NOTE — ED Notes (Signed)
Pt given breakfast tray

## 2018-06-30 NOTE — ED Notes (Addendum)
Pt standing in her room - watching TV   Pt visualized with NAD  No verbalized needs or concerns at this time   Pt s assessments completed by MDs and she will be discharged back to her group home  - group home staff told the overnight nurse that they will be here between 8-9 this am   Continue to monitor

## 2018-06-30 NOTE — ED Notes (Signed)
BEHAVIORAL HEALTH ROUNDING Patient sleeping: No. Patient alert and oriented: yes Behavior appropriate: Yes.  ; If no, describe:  Nutrition and fluids offered: yes Toileting and hygiene offered: Yes  Sitter present: q15 minute observations and security camera monitoring Law enforcement present: Yes  ODS  

## 2018-06-30 NOTE — ED Notes (Signed)
Pt. Went to bathroom returned to room with steady gait.

## 2018-06-30 NOTE — Discharge Instructions (Addendum)
Continue all medicines as directed by your doctor.  Return to the ER for worsening symptoms, feelings of hurting yourself or others, or other concerns. °

## 2018-06-30 NOTE — ED Notes (Signed)
Pt. Came to nursing station and requested and was given a cup of juice.

## 2018-06-30 NOTE — ED Provider Notes (Signed)
-----------------------------------------   1:07 AM on 06/30/2018 -----------------------------------------  Patient was evaluated by [facility] psychiatrist Dr. Morene Rankins who has reversed her IVC.  Deems her psychiatrically stable for discharge back to group home.  No medication changes recommended.   Irean Hong, MD 06/30/18 [phone]

## 2018-06-30 NOTE — ED Notes (Signed)
Called "[phone] pick up patient, Cherre Huger stated he would be able to pick up patient on shift change between 7a-9a in the morning.

## 2018-06-30 NOTE — ED Notes (Signed)
Made an attempt to call the group home for discharge arrangements   Dawn Lambert  [phone]  Received no answer  - pt reports feeling worried that they will not come to pick her up

## 2018-06-30 NOTE — ED Notes (Signed)
ED  Is the patient under IVC or is there intent for IVC:  Voluntary  Is the patient medically cleared: Yes.   Is there vacancy in the ED BHU: Yes.   Is the population mix appropriate for patient: Yes.   Is the patient awaiting placement in inpatient or outpatient setting: Yes.   Has the patient had a psychiatric consult: Yes.   Survey of unit performed for contraband, proper placement and condition of furniture, tampering with fixtures in bathroom, shower, and each patient room: Yes.  ; Findings:  APPEARANCE/BEHAVIOR Calm and cooperative NEURO ASSESSMENT Orientation: oriented x3  Denies pain Hallucinations: No.None noted (Hallucinations) denies Speech: Normal Gait: normal RESPIRATORY ASSESSMENT Even  Unlabored respirations  CARDIOVASCULAR ASSESSMENT Pulses equal   regular rate  Skin warm and dry   GASTROINTESTINAL ASSESSMENT no GI complaint EXTREMITIES Full ROM  PLAN OF CARE Provide calm/safe environment. Vital signs assessed twice daily. ED BHU Assessment once each 12-hour shift. Collaborate with TTS daily or as condition indicates. Assure the ED provider has rounded once each shift. Provide and encourage hygiene. Provide redirection as needed. Assess for escalating behavior; address immediately and inform ED provider.  Assess family dynamic and appropriateness for visitation as needed: Yes.  ; If necessary, describe findings:  Educate the patient/family about BHU procedures/visitation: Yes.  ; If necessary, describe findings:

## 2018-06-30 NOTE — ED Notes (Signed)
Called Renee Frances(patients mother and [place]) [phone] (cell) left message on answering service.

## 2018-06-30 NOTE — ED Notes (Signed)
Pt. Is up using bathroom, pt. Returned to room with steady gait.

## 2018-06-30 NOTE — ED Notes (Signed)

## 2018-07-03 NOTE — ED Provider Notes (Signed)
[facility] Emergency Department Provider Note  Time seen: 9:53 PM  I have reviewed the triage vital signs and the nursing notes.   HISTORY  Chief Complaint Suicidal    HPI Dawn Lambert is a 28 y.o. female with a past medical history of anxiety, diabetes, hypertension, mental retardation, mood disorder, presents to the emergency department from her group home for suicidal ideation.  Per patient she wants to kill herself because she is tired of living at a group home.   Past Medical History:  Diagnosis Date  . Anxiety   . Arnold-Chiari malformation (HCC)   . Atrial septal defect   . Complication of anesthesia    when pt was 11, woke up during surgery (muscle biopsy)  . Constipation   . Contraception management   . Dandruff   . Dermatomyositis (HCC)    age 65  . Diabetes mellitus without complication (HCC)   . Elevated blood pressure   . Hypertension   . Hypothyroidism   . Mental retardation   . Mood disorder (HCC)   . Overweight(278.02)   . Pelvic pain   . Prediabetes     Patient Active Problem List   Diagnosis Date Noted  . 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 Intellectual disability 10/12/2017  . Umbilical hernia without obstruction and without gangrene 08/15/2017  . Chest pain 11/05/2012  . Atrial septal defect 09/19/2011    Past Surgical History:  Procedure Laterality Date  . ASD REPAIR     at age 4  . UMBILICAL HERNIA REPAIR N/A 09/17/2017   Procedure: HERNIA REPAIR UMBILICAL ADULT;  Surgeon: Earline Mayotte, MD;  Location: [facility] ORS;  Service: General;  Laterality: N/A;    Prior to Admission medications   Medication Sig Start Date End Date Taking? Authorizing Provider  Asenapine Maleate (SAPHRIS) 10 MG SUBL Place 10 mg under the tongue 2 (two) times daily.   Yes [provider]  atorvastatin (LIPITOR) 10 MG tablet Take 10 mg by mouth daily at 6 PM.    Yes [provider]  carbamazepine (TEGRETOL) 200 MG tablet Take 400 mg by mouth 2 (two) times daily.   Yes [provider]  cholecalciferol (VITAMIN D) 1000 units tablet Take 1,000 Units by mouth daily.    Yes [provider]  clonazePAM (KLONOPIN) 1 MG tablet Take 1 mg by mouth 2 (two) times daily.   Yes [provider]  clotrimazole (LOTRIMIN) 1 % cream Apply 1 application topically 2 (two) times daily.   Yes [provider]  Fluocinolone Acetonide Scalp (DERMA-SMOOTHE/FS SCALP) 0.01 % OIL Apply 1-5 application topically See admin instructions. Apply to scalp every week for psoriasis but may increase up to 5 days per week as needed.   Yes [provider]  fluticasone (FLONASE) 50 MCG/ACT nasal spray Place 2 sprays into the nose daily as needed for allergies.    Yes [provider]  gabapentin (NEURONTIN) 600 MG tablet Take 600 mg by mouth 3 (three) times daily.   Yes [provider]  levothyroxine (SYNTHROID, LEVOTHROID) 175 MCG tablet Take 175 mcg by mouth daily before breakfast.   Yes [provider]  lisinopril (PRINIVIL,ZESTRIL) 20 MG tablet Take 20 mg by mouth daily.    Yes [provider]  lithium carbonate 300 MG capsule Take 600 mg by mouth at bedtime.    Yes [provider]  LORazepam (ATIVAN) 1 MG tablet Take 2 mg by  mouth daily as needed for anxiety.    Yes [provider]  magnesium hydroxide (MILK OF MAGNESIA) 400 MG/5ML suspension Take 30 mLs by mouth at bedtime as needed for moderate constipation.    Yes [provider]  medroxyPROGESTERone (DEPO-PROVERA) 150 MG/ML injection Inject 150 mg into the muscle See admin instructions. "Every 12 weeks"   Yes [provider]  metFORMIN (GLUCOPHAGE) 500 MG tablet Take 500 mg by mouth daily.    Yes [provider]  mometasone (ELOCON) 0.1 % cream Apply 1 application topically daily as needed (Itching of the ears).    Yes  [provider]  Multiple Vitamin (THEREMS PO) Take 1 tablet by mouth daily.   Yes [provider]  Paliperidone Palmitate ER (INVEGA TRINZA) 819 MG/2.625ML SUSY Inject 819 mg into the muscle every 3 (three) months.   Yes [provider]  propranolol (INDERAL) 40 MG tablet Take 40 mg by mouth 2 (two) times daily.   Yes [provider]  QUEtiapine (SEROQUEL XR) 300 MG 24 hr tablet Take 300 mg by mouth at bedtime.   Yes [provider]  sertraline (ZOLOFT) 100 MG tablet Take 100 mg by mouth daily.    Yes [provider]    Allergies  Allergen Reactions  . Augmentin [Amoxicillin-Pot Clavulanate] Other (See Comments)    Has patient had a PCN reaction causing immediate rash, facial/tongue/throat swelling, SOB or lightheadedness with hypotension: ______ Has patient had a PCN reaction causing severe rash involving mucus membranes or skin necrosis: _______ Has patient had a PCN reaction that required hospitalization: _______ Has patient had a PCN reaction occurring within the last 10 years:_______ If all of the above answers are "NO", then may proceed with Cephalosporin use.     History reviewed. No pertinent family history.  Social History Social History   Tobacco Use  . Smoking status: Never Smoker  . Smokeless tobacco: Never Used  Substance Use Topics  . Alcohol use: No  . Drug use: No    Review of Systems Constitutional: Negative for fever Cardiovascular: Negative for chest pain. Respiratory: Negative for shortness of breath. Gastrointestinal: Negative for abdominal pain Musculoskeletal: Negative for musculoskeletal complaints Neurological: Negative for headache All other ROS negative  ____________________________________________   PHYSICAL EXAM:  VITAL SIGNS: ED Triage Vitals [06/29/18 1716]  Enc Vitals Group     BP (!) 146/89     Pulse Rate 74     Resp 16     Temp 98.1 F (36.7 C)     Temp Source Oral     SpO2  100 %     Weight 292 lb 15.9 oz (132.9 kg)     Height      Head Circumference      Peak Flow      Pain Score 0     Pain Loc      Pain Edu?      Excl. in GC?     Constitutional: Alert, no acute distress. Eyes: Normal exam ENT   Head: Normocephalic and atraumatic.   Mouth/Throat: Mucous membranes are moist. Cardiovascular: Normal rate, regular rhythm Respiratory: Normal respiratory effort without tachypnea nor retractions. Breath sounds are clear  Gastrointestinal: Soft and nontender.  Musculoskeletal: Nontender with normal range of motion in all extremities.  Neurologic:  Normal speech and language. No gross focal neurologic deficits  Skin:  Skin is warm, dry and intact.  Psychiatric: Suicidal ideation.  ____________________________________________   INITIAL IMPRESSION / ASSESSMENT AND PLAN /  ED COURSE  Pertinent labs & imaging results that were available during my care of the patient were reviewed by me and considered in my medical decision making (see chart for details).  Patient presents to the emergency department from her group home with suicidal ideation.  States she wants to kill herself because she does not like being at her group home.  Does not appear to have an active plan to do so.  States she wants to go to a new group home.  Patient's labs are largely within normal limits.  Awaiting psychiatric disposition  ____________________________________________   FINAL CLINICAL IMPRESSION(S) / ED DIAGNOSES  Suicidal ideation   Minna Antis, MD 07/03/18 2155

## 2018-10-05 ENCOUNTER — Encounter: Payer: Self-pay | Admitting: Psychiatry

## 2018-10-05 ENCOUNTER — Other Ambulatory Visit: Payer: Self-pay

## 2018-10-05 ENCOUNTER — Emergency Department
Admission: EM | Admit: 2018-10-05 | Discharge: 2018-10-08 | Disposition: A | Discharge status: Home / Self Care | Payer: Medicaid Other | Attending: Emergency Medicine | Admitting: Emergency Medicine

## 2018-10-05 DIAGNOSIS — R440 Auditory hallucinations: Secondary | ICD-10-CM | POA: Diagnosis not present

## 2018-10-05 DIAGNOSIS — F39 Unspecified mood [affective] disorder: Secondary | ICD-10-CM | POA: Insufficient documentation

## 2018-10-05 DIAGNOSIS — E039 Hypothyroidism, unspecified: Secondary | ICD-10-CM | POA: Insufficient documentation

## 2018-10-05 DIAGNOSIS — F70 Mild intellectual disabilities: Secondary | ICD-10-CM | POA: Diagnosis not present

## 2018-10-05 DIAGNOSIS — Z7984 Long term (current) use of oral hypoglycemic drugs: Secondary | ICD-10-CM | POA: Diagnosis not present

## 2018-10-05 DIAGNOSIS — I10 Essential (primary) hypertension: Secondary | ICD-10-CM | POA: Diagnosis not present

## 2018-10-05 DIAGNOSIS — Z79899 Other long term (current) drug therapy: Secondary | ICD-10-CM | POA: Insufficient documentation

## 2018-10-05 DIAGNOSIS — R4689 Other symptoms and signs involving appearance and behavior: Secondary | ICD-10-CM | POA: Diagnosis present

## 2018-10-05 DIAGNOSIS — E119 Type 2 diabetes mellitus without complications: Secondary | ICD-10-CM | POA: Insufficient documentation

## 2018-10-05 DIAGNOSIS — F4320 Adjustment disorder, unspecified: Secondary | ICD-10-CM | POA: Diagnosis not present

## 2018-10-05 DIAGNOSIS — F339 Major depressive disorder, recurrent, unspecified: Secondary | ICD-10-CM | POA: Diagnosis not present

## 2018-10-05 DIAGNOSIS — R45851 Suicidal ideations: Secondary | ICD-10-CM | POA: Diagnosis not present

## 2018-10-05 DIAGNOSIS — R45850 Homicidal ideations: Secondary | ICD-10-CM | POA: Insufficient documentation

## 2018-10-05 LAB — CBC WITH DIFFERENTIAL/PLATELET
Abs Immature Granulocytes: 0.07 10*3/uL (ref 0.00–0.07)
Basophils Absolute: 0 10*3/uL (ref 0.0–0.1)
Basophils Relative: 0 %
Eosinophils Absolute: 0.1 10*3/uL (ref 0.0–0.5)
Eosinophils Relative: 1 %
HCT: 36.7 % (ref 36.0–46.0)
Hemoglobin: 11.3 g/dL — ABNORMAL LOW (ref 12.0–15.0)
Immature Granulocytes: 1 %
Lymphocytes Relative: 16 %
Lymphs Abs: 1.9 10*3/uL (ref 0.7–4.0)
MCH: 28.1 pg (ref 26.0–34.0)
MCHC: 30.8 g/dL (ref 30.0–36.0)
MCV: 91.3 fL (ref 80.0–100.0)
Monocytes Absolute: 0.6 10*3/uL (ref 0.1–1.0)
Monocytes Relative: 5 %
Neutro Abs: 9.2 10*3/uL — ABNORMAL HIGH (ref 1.7–7.7)
Neutrophils Relative %: 77 %
Platelets: 311 10*3/uL (ref 150–400)
RBC: 4.02 MIL/uL (ref 3.87–5.11)
RDW: 13.1 % (ref 11.5–15.5)
WBC: 12 10*3/uL — ABNORMAL HIGH (ref 4.0–10.5)
nRBC: 0 % (ref 0.0–0.2)

## 2018-10-05 LAB — COMPREHENSIVE METABOLIC PANEL
ALT: 23 U/L (ref 0–44)
AST: 22 U/L (ref 15–41)
Albumin: 4 g/dL (ref 3.5–5.0)
Alkaline Phosphatase: 83 U/L (ref 38–126)
Anion gap: 10 (ref 5–15)
BUN: 11 mg/dL (ref 6–20)
CO2: 27 mmol/L (ref 22–32)
Calcium: 9.2 mg/dL (ref 8.9–10.3)
Chloride: 104 mmol/L (ref 98–111)
Creatinine, Ser: 0.64 mg/dL (ref 0.44–1.00)
GFR calc Af Amer: >60 mL/min (ref >60)
GFR calc non Af Amer: >60 mL/min (ref >60)
Glucose, Bld: 161 mg/dL — ABNORMAL HIGH (ref 70–99)
Potassium: 3.5 mmol/L (ref 3.5–5.1)
Sodium: 141 mmol/L (ref 135–145)
Total Bilirubin: 0.3 mg/dL (ref 0.3–1.2)
Total Protein: 7.7 g/dL (ref 6.5–8.1)

## 2018-10-05 LAB — URINALYSIS, COMPLETE (UACMP) WITH MICROSCOPIC
Bacteria, UA: NONE SEEN
Bilirubin Urine: NEGATIVE
Glucose, UA: NEGATIVE mg/dL
Ketones, ur: NEGATIVE mg/dL
Leukocytes,Ua: NEGATIVE
Nitrite: NEGATIVE
Protein, ur: NEGATIVE mg/dL
Specific Gravity, Urine: 1.016 (ref 1.005–1.030)
pH: 5 (ref 5.0–8.0)

## 2018-10-05 LAB — PREGNANCY, URINE: Preg Test, Ur: NEGATIVE

## 2018-10-05 LAB — URINE DRUG SCREEN, QUALITATIVE (ARMC ONLY)
Amphetamines, Ur Screen: NOT DETECTED
Barbiturates, Ur Screen: NOT DETECTED
Benzodiazepine, Ur Scrn: POSITIVE — AB
Cannabinoid 50 Ng, Ur ~~LOC~~: NOT DETECTED
Cocaine Metabolite,Ur ~~LOC~~: NOT DETECTED
MDMA (Ecstasy)Ur Screen: NOT DETECTED
Methadone Scn, Ur: NOT DETECTED
Opiate, Ur Screen: NOT DETECTED
Phencyclidine (PCP) Ur S: NOT DETECTED
Tricyclic, Ur Screen: NOT DETECTED

## 2018-10-05 LAB — ETHANOL: Alcohol, Ethyl (B): <10 mg/dL (ref <10)

## 2018-10-05 LAB — SALICYLATE LEVEL: Salicylate Lvl: <7 mg/dL (ref 2.8–30.0)

## 2018-10-05 LAB — ACETAMINOPHEN LEVEL: Acetaminophen (Tylenol), Serum: <10 ug/mL — ABNORMAL LOW (ref 10–30)

## 2018-10-05 MED ORDER — DESOXIMETASONE 0.25 % EX LIQD: 1.0000 "application " | Freq: Every day | CUTANEOUS | Status: DC | PRN

## 2018-10-05 MED ORDER — LEVOTHYROXINE SODIUM 75 MCG PO TABS
175.0000 ug | ORAL_TABLET | Freq: Every day | ORAL | Status: DC
  Administered 2018-10-06 – 2018-10-08 (×3): 175 ug via ORAL
  Filled 2018-10-05 (×2): qty 1
  Filled 2018-10-05: qty 4

## 2018-10-05 MED ORDER — MAGNESIUM HYDROXIDE 400 MG/5ML PO SUSP
30.0000 mL | Freq: Every evening | ORAL | Status: DC | PRN
  Filled 2018-10-05: qty 30

## 2018-10-05 MED ORDER — ACETAMINOPHEN 325 MG PO TABS: 650.0000 mg | ORAL_TABLET | Freq: Four times a day (QID) | ORAL | Status: DC | PRN

## 2018-10-05 MED ORDER — LORAZEPAM 2 MG PO TABS: 2.0000 mg | ORAL_TABLET | ORAL | Status: DC

## 2018-10-05 MED ORDER — FLUTICASONE PROPIONATE 50 MCG/ACT NA SUSP
2.0000 | Freq: Every day | NASAL | Status: DC | PRN
  Filled 2018-10-05: qty 16

## 2018-10-05 MED ORDER — VITAMIN D 25 MCG (1000 UNIT) PO TABS
1000.0000 [IU] | ORAL_TABLET | Freq: Every day | ORAL | Status: DC
  Administered 2018-10-06 – 2018-10-08 (×3): 1000 [IU] via ORAL
  Filled 2018-10-05 (×3): qty 1

## 2018-10-05 MED ORDER — CLONAZEPAM 1 MG PO TABS
1.0000 mg | ORAL_TABLET | Freq: Three times a day (TID) | ORAL | Status: DC
  Administered 2018-10-05 – 2018-10-08 (×8): 1 mg via ORAL
  Filled 2018-10-05: qty 1
  Filled 2018-10-05: qty 2
  Filled 2018-10-05 (×5): qty 1
  Filled 2018-10-05: qty 2

## 2018-10-05 MED ORDER — FLUOCINOLONE ACETONIDE SCALP 0.01 % EX OIL: 1.0000 "application " | TOPICAL_OIL | CUTANEOUS | Status: DC

## 2018-10-05 MED ORDER — GABAPENTIN 600 MG PO TABS
600.0000 mg | ORAL_TABLET | Freq: Three times a day (TID) | ORAL | Status: DC
  Administered 2018-10-05 – 2018-10-07 (×5): 600 mg via ORAL
  Filled 2018-10-05 (×5): qty 1

## 2018-10-05 MED ORDER — QUETIAPINE FUMARATE ER 300 MG PO TB24
300.0000 mg | ORAL_TABLET | Freq: Every day | ORAL | Status: DC
  Administered 2018-10-06: 300 mg via ORAL
  Filled 2018-10-05 (×2): qty 1

## 2018-10-05 MED ORDER — MOMETASONE FUROATE 0.1 % EX CREA
1.0000 "application " | TOPICAL_CREAM | Freq: Every day | CUTANEOUS | Status: DC | PRN
  Filled 2018-10-05: qty 15

## 2018-10-05 MED ORDER — PROPRANOLOL HCL 10 MG PO TABS
40.0000 mg | ORAL_TABLET | Freq: Two times a day (BID) | ORAL | Status: DC
  Administered 2018-10-05 – 2018-10-08 (×6): 40 mg via ORAL
  Filled 2018-10-05: qty 4
  Filled 2018-10-05: qty 2
  Filled 2018-10-05 (×3): qty 4
  Filled 2018-10-05: qty 2

## 2018-10-05 MED ORDER — METFORMIN HCL 500 MG PO TABS
500.0000 mg | ORAL_TABLET | Freq: Two times a day (BID) | ORAL | Status: DC
  Administered 2018-10-05 – 2018-10-08 (×6): 500 mg via ORAL
  Filled 2018-10-05 (×6): qty 1

## 2018-10-05 MED ORDER — ATORVASTATIN CALCIUM 10 MG PO TABS
10.0000 mg | ORAL_TABLET | Freq: Every day | ORAL | Status: DC
  Administered 2018-10-06 – 2018-10-07 (×2): 10 mg via ORAL
  Filled 2018-10-05 (×3): qty 1

## 2018-10-05 MED ORDER — CARBAMAZEPINE 200 MG PO TABS
400.0000 mg | ORAL_TABLET | Freq: Two times a day (BID) | ORAL | Status: DC
  Administered 2018-10-05 – 2018-10-08 (×6): 400 mg via ORAL
  Filled 2018-10-05 (×6): qty 2

## 2018-10-05 MED ORDER — LISINOPRIL 20 MG PO TABS
20.0000 mg | ORAL_TABLET | Freq: Every day | ORAL | Status: DC
  Administered 2018-10-06 – 2018-10-07 (×2): 20 mg via ORAL
  Filled 2018-10-05 (×2): qty 1
  Filled 2018-10-05: qty 2

## 2018-10-05 MED ORDER — LITHIUM CARBONATE 300 MG PO CAPS
900.0000 mg | ORAL_CAPSULE | Freq: Every day | ORAL | Status: DC
  Administered 2018-10-06 – 2018-10-07 (×2): 900 mg via ORAL
  Filled 2018-10-05 (×2): qty 3

## 2018-10-05 MED ORDER — CLOTRIMAZOLE 1 % EX CREA
1.0000 "application " | TOPICAL_CREAM | Freq: Two times a day (BID) | CUTANEOUS | Status: DC | PRN
  Filled 2018-10-05: qty 15

## 2018-10-05 MED ORDER — ASENAPINE MALEATE 5 MG SL SUBL
10.0000 mg | SUBLINGUAL_TABLET | Freq: Two times a day (BID) | SUBLINGUAL | Status: DC
  Administered 2018-10-06 – 2018-10-07 (×3): 10 mg via SUBLINGUAL
  Filled 2018-10-05 (×5): qty 2
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
## 2018-10-05 NOTE — ED Notes (Signed)
Pt. Completed SOC. 

## 2018-10-05 NOTE — ED Notes (Signed)
Patient Belongings  Purple like watch Yellow plated ring with purple stone Yellow plated ring with pink stone Yellow plated ring with red stone Brown flip flops Red dress Red tank top Black underwear

## 2018-10-05 NOTE — ED Notes (Signed)
Pt's mother, [name] [name], contacted and notified of patient's admission.

## 2018-10-05 NOTE — ED Notes (Signed)
SOC called, report given.  SOC machine placed in patients room, pt. Ready.

## 2018-10-05 NOTE — BH Assessment (Signed)
Assessment Note  Dawn Lambert is an 28 y.o. female. Who present from her group home after endorsing AH and suicidal ideation with out plan. Pt admits to exposing herself and running away from the facility. She states " [place]."She reports that she does not feel safe and that she began having hallucinations and SI a few days ago. Pt states that voices tell her that staff will hurt there. She also admit to having thoughts of hurting staff. Pt states " I want to scare them and hit them." Pt denied any active SI, plan or intent. She denies the presence of paranoia and reports sleeping well.   This Probation officer then called pt's group home owner ( Mr. Truman Hayward: [phone]) to gather collateral information. He states that the pt stated that she wanted to go to jail today. He reports that she attempted to runaway on several occassions. Per his report she exposeed her self, he states that this is typical behavior for the pt. Per his report the pt also walked out into the street naked. He states that his was unaware that he pt had began to experience hallucinations. Mr Truman Hayward confirms that this is a new symptom for the pt. He has agreed to pick the pt up if and when discharge is recommended.    Diagnosis: Depression   Past Medical History:  Past Medical History:  Diagnosis Date  . Anxiety   . Arnold-Chiari malformation ([place])   . Atrial septal defect   . Complication of anesthesia    when pt was 11, woke up during surgery (muscle biopsy)  . Constipation   . Contraception management   . Dandruff   . Dermatomyositis ([place])    age 76  . Diabetes mellitus without complication ([place])   . Elevated blood pressure   . Hypertension   . Hypothyroidism   . Mental retardation   . Mood disorder ([place])   . Overweight(278.02)   . Pelvic pain   . Prediabetes     Past Surgical History:  Procedure Laterality Date  . ASD REPAIR     at age 59  . UMBILICAL HERNIA REPAIR N/A 09/17/2017   Procedure: HERNIA  REPAIR UMBILICAL ADULT;  Surgeon: Robert Bellow, MD;  Location: [facility] ORS;  Service: General;  Laterality: N/A;    Family History: History reviewed. No pertinent family history.  Social History:  reports that she has never smoked. She has never used smokeless tobacco. She reports that she does not drink alcohol or use drugs.  Additional Social History:  Alcohol / Drug Use Pain Medications: See PTA Prescriptions: See PTA Over the Counter: See PTA History of alcohol / drug use?: No history of alcohol / drug abuse Longest period of sobriety (when/how long): None reported   CIWA: CIWA-Ar BP: 140/85 Pulse Rate: 85 COWS:    Allergies:  Allergies  Allergen Reactions  . Augmentin [Amoxicillin-Pot Clavulanate] Other (See Comments)    Has patient had a PCN reaction causing immediate rash, facial/tongue/throat swelling, SOB or lightheadedness with hypotension: ______ Has patient had a PCN reaction causing severe rash involving mucus membranes or skin necrosis: _______ Has patient had a PCN reaction that required hospitalization: _______ Has patient had a PCN reaction occurring within the last 10 years:_______ If all of the above answers are "NO", then may proceed with Cephalosporin use.     Home Medications: (Not in a hospital admission)   OB/GYN Status:  No LMP recorded. Patient has had an injection.  General Assessment Data [facility] Assessment: In system Is this a Tele or Face-to-Face Assessment?: Tele Assessment Language Other than English: No Living Arrangements: In Group Home: (Comment: Name of Group Home) What gender do you identify as?: Female Pregnancy Status: No Living Arrangements: Group Home Can pt return to current living arrangement?: Yes Admission Status: Involuntary Petitioner: Other(GROUP HOME ) Is patient capable of signing voluntary admission?: No Referral Source: Other Insurance type: Medicaid   Medical Screening Exam [facility]  Walk-in ONLY) Medical Exam completed: Yes  Crisis Care Plan Living Arrangements: Group Home Legal Guardian: Mother Name of Psychiatrist: With Group Home  Name of Therapist: With Group Home   Education Status Is patient currently in school?: No Is the patient employed, unemployed or receiving disability?: Receiving disability income  Risk to self with the past 6 months Suicidal Ideation: Yes-Currently Present Has patient been a risk to self within the past 6 months prior to admission? : Yes Suicidal Intent: Yes-Currently Present Has patient had any suicidal intent within the past 6 months prior to admission? : Yes Is patient at risk for suicide?: Yes Suicidal Plan?: No Has patient had any suicidal plan within the past 6 months prior to admission? : No What has been your use of drugs/alcohol within the last 12 months?: none Previous Attempts/Gestures: No How many times?: 0 Other Self Harm Risks: none noted Intentional Self Injurious Behavior: None Family Suicide History: Unknown Recent stressful life event(s): Recent negative physical changes, Conflict (Comment) Persecutory voices/beliefs?: Yes Depression: Yes Depression Symptoms: Isolating Substance abuse history and/or treatment for substance abuse?: No Suicide prevention information given to non-admitted patients: Yes  Risk to Others within the past 6 months Homicidal Ideation: No Does patient have any lifetime risk of violence toward others beyond the six months prior to admission? : No Thoughts of Harm to Others: Yes-Currently Present Comment - Thoughts of Harm to Others: to scare and hit staff Current Homicidal Intent: No Current Homicidal Plan: No Access to Homicidal Means: No Identified Victim: [facility] Staff History of harm to others?: No Assessment of Violence: None Noted Violent Behavior Description: none noted Does patient have access to weapons?: No Criminal Charges Pending?: No Does patient have a court date:  No Is patient on probation?: No  Psychosis Hallucinations: Auditory(command) Delusions: None noted  Mental Status Report Appearance/Hygiene: In scrubs Eye Contact: Fair Motor Activity: Freedom of movement Speech: Pressured Level of Consciousness: Alert Mood: Depressed Affect: Anxious Anxiety Level: Minimal Thought Processes: Relevant Judgement: Partial Orientation: Situation, Place, Time, Person Obsessive Compulsive Thoughts/Behaviors: None  Cognitive Functioning Concentration: Fair Memory: Remote Intact, Recent Intact Is patient IDD: Yes Level of Function: low Is IQ score available?: No Insight: Poor Impulse Control: Poor Appetite: Fair Have you had any weight changes? : No Change Sleep: No Change Total Hours of Sleep: 8 Vegetative Symptoms: None  ADLScreening [facility] Assessment Services) Patient's cognitive ability adequate to safely complete daily activities?: Yes Patient able to express need for assistance with ADLs?: Yes Independently performs ADLs?: Yes (appropriate for developmental age)  Prior Inpatient Therapy Prior Inpatient Therapy: No  Prior Outpatient Therapy Prior Outpatient Therapy: Yes Prior Therapy Dates: current  [facility] Does patient have an ACCT team?: No Does patient have Intensive In-House Services?  : No Does patient have [facility] services? : No Does patient have P4CC services?: No  ADL Screening (condition at time of admission) Patient's cognitive ability adequate to safely complete daily activities?: Yes Patient able to express need  for assistance with ADLs?: Yes Independently performs ADLs?: Yes (appropriate for developmental age)       Abuse/Neglect Assessment (Assessment to be complete while patient is alone) Abuse/Neglect Assessment Can Be Completed: Yes Physical Abuse: Denies Verbal Abuse: Denies Exploitation of patient/patient's resources: Denies Self-Neglect: Denies Values /  Beliefs Cultural Requests During Hospitalization: None Spiritual Requests During Hospitalization: None Consults Spiritual Care Consult Needed: No Social Work Consult Needed: No Merchant navy officerAdvance Directives (For Healthcare) Does Patient Have a Medical Advance Directive?: No Would patient like information on creating a medical advance directive?: No - Patient declined          Disposition:  Disposition Initial Assessment Completed for this Encounter: Yes Patient referred to: Other (Comment)  On Site Evaluation by:   Reviewed with Physician:    Asa SaunasShawanna N Arlina Sabina 10/05/2018 11:44 PM

## 2018-10-05 NOTE — ED Notes (Signed)
TTS talking to patient via Video machine.

## 2018-10-06 MED ORDER — QUETIAPINE FUMARATE 25 MG PO TABS
25.0000 mg | ORAL_TABLET | Freq: Three times a day (TID) | ORAL | Status: DC
  Administered 2018-10-06 – 2018-10-08 (×7): 25 mg via ORAL
  Filled 2018-10-06 (×6): qty 1

## 2018-10-06 NOTE — ED Notes (Signed)
Referral information for Child/Adolescent Placement have been faxed to;      [facility]nn [facility] ([phone]/[phone])   [facility]    [facility]    [facility]    

## 2018-10-06 NOTE — ED Notes (Signed)
Hourly rounding reveals patient sleeping in room. No complaints, stable, in no acute distress. Q15 minute rounds and monitoring via Security Cameras to continue. 

## 2018-10-06 NOTE — ED Notes (Signed)
Hourly rounding reveals patient in room. No complaints, stable, in no acute distress. Q15 minute rounds and monitoring via Security Cameras to continue. 

## 2018-10-06 NOTE — Progress Notes (Signed)
Patient reports hearing voices but does not appear to be responding.  "I'm hearing voices telling me to run away and hurt others."  Sleep is "pretty good."  Fixated on being "admitted".  Based on her typical behaviors of running away and taking her clothes off for attention, sensing secondary gain.  Seroquel added to her daytime medication regiment as she takes this in the PM and is already on Saphris.  Monitor today and should be ready to discharge in the am  Waylan Boga, PMHNP

## 2018-10-06 NOTE — ED Notes (Signed)
Report to include Situation, Background, Assessment, and Recommendations received from Amy B. RN. Patient alert and oriented, warm and dry, in no acute distress. Patient denies pain. Patient states she has SI without a plan and HI towards the [facility] staff and Hears voices and sees "heaven". Patient made aware of Q15 minute rounds and security cameras for their safety. Patient instructed to come to me with needs or concerns.

## 2018-10-06 NOTE — ED Notes (Signed)
Pt banging head on room window.  NP made aware. RN attempted to re-direct patient verbally.

## 2018-10-06 NOTE — ED Notes (Signed)
Nurse spoke to provider at the group home and let her know that Patient would be admitted, and she was agreeable.

## 2018-10-06 NOTE — ED Notes (Signed)
Pt given dinner tray.

## 2018-10-07 DIAGNOSIS — R440 Auditory hallucinations: Secondary | ICD-10-CM

## 2018-10-07 DIAGNOSIS — R45851 Suicidal ideations: Secondary | ICD-10-CM

## 2018-10-07 LAB — TSH: TSH: 1.532 u[IU]/mL (ref 0.350–4.500)

## 2018-10-07 LAB — LITHIUM LEVEL: Lithium Lvl: 0.26 mmol/L — ABNORMAL LOW (ref 0.60–1.20)

## 2018-10-07 MED ORDER — QUETIAPINE FUMARATE 25 MG PO TABS
25.0000 mg | ORAL_TABLET | Freq: Once | ORAL | Status: DC
  Filled 2018-10-07: qty 1

## 2018-10-07 MED ORDER — QUETIAPINE FUMARATE ER 200 MG PO TB24
400.0000 mg | ORAL_TABLET | Freq: Every day | ORAL | Status: DC
  Administered 2018-10-07: 400 mg via ORAL
  Filled 2018-10-07 (×2): qty 2

## 2018-10-07 NOTE — ED Notes (Signed)
BEHAVIORAL HEALTH ROUNDING Patient sleeping: No. Patient alert and oriented: yes Behavior appropriate: Yes.  ; If no, describe:  Nutrition and fluids offered: yes Toileting and hygiene offered: Yes  Sitter present: q15 minute observations and security camera monitoring Law enforcement present: Yes  ODS  

## 2018-10-07 NOTE — ED Notes (Signed)
Report received from Dawn Lambert observed lying in her bed in her room  - NAD observed

## 2018-10-07 NOTE — ED Notes (Signed)
Hourly rounding reveals patient sleeping in room. No complaints, stable, in no acute distress. Q15 minute rounds and monitoring via Security Cameras to continue. 

## 2018-10-07 NOTE — ED Notes (Signed)
BEHAVIORAL HEALTH ROUNDING Patient sleeping: Yes.   Patient alert and oriented: eyes closed  Appears asleep Behavior appropriate: Yes.  ; If no, describe:  Nutrition and fluids offered: Yes  Toileting and hygiene offered: sleeping Sitter present: q 15 minute observations and security camera monitoring Law enforcement present: yes  ODS 

## 2018-10-07 NOTE — ED Notes (Signed)
Lunch provided  Med administered as ordered  Assessment completed  She denies pain  Pt states  "I am ready to go back home to the group home  - I wonder if the doctor will write me an order that I can talk to my mother five times a week - I only get to talk two times a week right now.  I have not seen my mother since covid started"   Pt reassured

## 2018-10-07 NOTE — ED Notes (Signed)
IVC/ Consult Completed/ Will monitor and re eval I AM

## 2018-10-07 NOTE — ED Notes (Signed)
When I greeted patient in dayroom and ask how she is feeling she says she is very ready to go home today.  During suicide assessment patient states that she does want to hurt herself.  She thoguht if she could get some knives.  Then talkes about her nails and that she wants to scratch the windows with them.  But, then she talks about how she wants to go home today.

## 2018-10-07 NOTE — ED Notes (Signed)

## 2018-10-07 NOTE — ED Notes (Signed)
ED BHU Is the patient under IVC or is there intent for IVC: no Is the patient medically cleared: Yes.   Is there vacancy in the ED BHU: Yes.   Is the population mix appropriate for patient: Yes.   Is the patient awaiting placement in inpatient or outpatient setting: Yes. ?return to group home  Has the patient had a psychiatric consult: Yes.   Survey of unit performed for contraband, proper placement and condition of furniture, tampering with fixtures in bathroom, shower, and each patient room: Yes.  ; Findings:  APPEARANCE/BEHAVIOR Calm and cooperative NEURO ASSESSMENT Orientation: oriented x3  Denies pain Hallucinations: No.None noted (Hallucinations)  Reports "hearing voices"  Speech: Normal Gait: normal RESPIRATORY ASSESSMENT Even  Unlabored respirations  CARDIOVASCULAR ASSESSMENT Pulses equal   regular rate  Skin warm and dry   GASTROINTESTINAL ASSESSMENT no GI complaint EXTREMITIES Full ROM  PLAN OF CARE Provide calm/safe environment. Vital signs assessed twice daily. ED BHU Assessment once each 12-hour shift. Collaborate with TTS daily or as condition indicates. Assure the ED provider has rounded once each shift. Provide and encourage hygiene. Provide redirection as needed. Assess for escalating behavior; address immediately and inform ED provider.  Assess family dynamic and appropriateness for visitation as needed: Yes.  ; If necessary, describe findings:  Educate the patient/family about BHU procedures/visitation: Yes.  ; If necessary, describe findings:

## 2018-10-07 NOTE — ED Provider Notes (Signed)
-----------------------------------------   5:05 AM on 10/07/2018 -----------------------------------------   Blood pressure 121/71, pulse 79, temperature 98.7 F (37.1 C), temperature source Oral, resp. rate 18, height 5\' 3"  (1.6 m), weight 131.1 kg, SpO2 100 %.  The patient had no acute events since last update.  Calm and cooperative at this time.  Disposition is pending per Psychiatry/Behavioral Medicine team recommendations.     Alfred Levins, [name], MD 10/07/18 [phone]

## 2018-10-07 NOTE — ED Notes (Signed)
Got up to bathroom and then walked back to room.

## 2018-10-07 NOTE — Consult Note (Signed)
[facility] Consult   Reason for Consult: Consult for 28 year old woman with a history of intellectual disability Referring Physician: Quentin Cornwall Patient Identification: Dawn Lambert MRN:  [id number] Principal Diagnosis: <principal problem not specified> Diagnosis:   Patient Active Problem List   Diagnosis Date Noted  . Adjustment disorder with mixed disturbance of emotions and conduct [F43.25] 10/12/2017  . Intellectual disability [F79] 10/12/2017  . Umbilical hernia without obstruction and without gangrene [K42.9] 08/15/2017  . Chest pain [R07.9] 11/05/2012  . Atrial septal defect [Q21.1] 09/19/2011   Patient seen, chart is reviewed. Total Time spent with patient: 45 minutes  Subjective: " I am having voices telling me to break a window and cut my wrists with the glass."  HPI: Dawn Lambert is a 28 y.o. female who presented from her group home after endorsing AH and suicidal ideation with out plan. Pt admits to exposing herself and running away from the facility. She states " I was in the [place] naked."She reports that she does not feel safe and that she began having hallucinations and SI a few days ago. Pt states that voices tell her that staff will hurt there. She also admit to having thoughts of hurting staff. Pt states " I want to scare them and hit them." Pt denied any active SI, plan or intent. She denies the presence of paranoia and reports sleeping well.  10/05/2018 Collateral per TTS: ( Mr. Truman Hayward: [phone]) to gather collateral information. He states that the pt stated that she wanted to go to jail today. He reports that she attempted to runaway on several occassions. Per his report she exposed her self, he states that this is typical behavior for the pt. Per his report the pt also walked out into the street naked. He states that his was unaware that he pt had began to experience hallucinations. Mr Truman Hayward confirms that this is a new symptom for the pt. He has  agreed to pick the pt up if and when discharge is recommended.   On evaluation this morning, patient reports that she has continued to have command auditory hallucinations telling her to hurt herself.  She states that she is being instructed to break glass and cut her wrists.  Patient does have access to these items, and does endorse that she believes she would do these things.  Patient denies homicidal ideation, she is denying visual hallucinations.  Patient denies other problems at the group home, and "wants to get well in order to return back to her home." Patient has informed nurses that she feels like she needs more medication to stop her auditory hallucinations.  She also states that she misses being able to see her mom due to the COVID-19 quarantine.  She is only allowed to speak to her mom by phone twice a week, and would like to be able to speak to her mom 5 times a week.   From record review with updates: Medical history: Obese.  Intellectual disability.  Multiple other congenital problems.  Substance abuse history: No alcohol or drug abuse  Social history: Has been living in the same group home for many years.  Admits that she gets along fine with them most of the time.  Past Psychiatric History: No previous psychiatric hospitalization.  No previous suicide attempts no history of violence.  Patient clearly has intellectual disability.  She says that she has been manipulative in the past to try to get her way.  Risk to Self: Suicidal Ideation: Yes-Currently Present  Suicidal Intent: Yes-Currently Present Is patient at risk for suicide?: Yes Suicidal Plan?: No What has been your use of drugs/alcohol within the last 12 months?: none How many times?: 0 Other Self Harm Risks: none noted Intentional Self Injurious Behavior: None Risk to Others: Homicidal Ideation: No Thoughts of Harm to Others: Yes-Currently Present Comment - Thoughts of Harm to Others: to scare and hit staff Current  Homicidal Intent: No Current Homicidal Plan: No Access to Homicidal Means: No Identified Victim: [facility] Staff History of harm to others?: No Assessment of Violence: None Noted Violent Behavior Description: none noted Does patient have access to weapons?: No Criminal Charges Pending?: No Does patient have a court date: No Prior Inpatient Therapy: Prior Inpatient Therapy: No Prior Outpatient Therapy: Prior Outpatient Therapy: Yes Prior Therapy Dates: current  [facility] Does patient have an ACCT team?: No Does patient have Intensive In-House Services?  : No Does patient have [facility] services? : No Does patient have P4CC services?: No  Past Medical History:  Past Medical History:  Diagnosis Date  . Anxiety   . Arnold-Chiari malformation (HCC)   . Atrial septal defect   . Complication of anesthesia    when pt was 11, woke up during surgery (muscle biopsy)  . Constipation   . Contraception management   . Dandruff   . Dermatomyositis (HCC)    age 89  . Diabetes mellitus without complication (HCC)   . Elevated blood pressure   . Hypertension   . Hypothyroidism   . Mental retardation   . Mood disorder (HCC)   . Overweight(278.02)   . Pelvic pain   . Prediabetes     Past Surgical History:  Procedure Laterality Date  . ASD REPAIR     at age 89  . UMBILICAL HERNIA REPAIR N/A 09/17/2017   Procedure: HERNIA REPAIR UMBILICAL ADULT;  Surgeon: [name], [name], MD;  Location: [facility] ORS;  Service: General;  Laterality: N/A;   Family History: History reviewed. No pertinent family history.   Family Psychiatric  History: Unknown   Social History:  Social History   Substance and Sexual Activity  Alcohol Use No     Social History   Substance and Sexual Activity  Drug Use No    Social History   Socioeconomic History  . Marital status: Single    Spouse name: Not on file  . Number of children: Not on file  . Years of education: Not on file   . Highest education level: Not on file  Occupational History  . Not on file  Social Needs  . Financial resource strain: Not on file  . Food insecurity:    Worry: Not on file    Inability: Not on file  . Transportation needs:    Medical: Not on file    Non-medical: Not on file  Tobacco Use  . Smoking status: Never Smoker  . Smokeless tobacco: Never Used  Substance and Sexual Activity  . Alcohol use: No  . Drug use: No  . Sexual activity: Not on file  Lifestyle  . Physical activity:    Days per week: Not on file    Minutes per session: Not on file  . Stress: Not on file  Relationships  . Social connections:    Talks on phone: Not on file    Gets together: Not on file    Attends religious service: Not on file    Active member of club or organization: Not on file  Attends meetings of clubs or organizations: Not on file    Relationship status: Not on file  Other Topics Concern  . Not on file  Social History Narrative  . Not on file   Additional Social History:   Lives in group home, patient is welcome back to group home after psychiatrically stabilized.  Allergies:   Allergies  Allergen Reactions  . Augmentin [Amoxicillin-Pot Clavulanate] Other (See Comments)    Has patient had a PCN reaction causing immediate rash, facial/tongue/throat swelling, SOB or lightheadedness with hypotension: ______ Has patient had a PCN reaction causing severe rash involving mucus membranes or skin necrosis: _______ Has patient had a PCN reaction that required hospitalization: _______ Has patient had a PCN reaction occurring within the last 10 years:_______ If all of the above answers are "NO", then may proceed with Cephalosporin use.     Labs:  Results for orders placed or performed during the hospital encounter of 10/05/18 (from the past 48 hour(s))  CBC with Differential     Status: Abnormal   Collection Time: 10/05/18  6:18 PM  Result Value Ref Range   WBC 12.0 (H) 4.0 - 10.5  K/uL   RBC 4.02 3.87 - 5.11 MIL/uL   Hemoglobin 11.3 (L) 12.0 - 15.0 g/dL   HCT 40.9 81.1 - 91.4 %   MCV 91.3 80.0 - 100.0 fL   MCH 28.1 26.0 - 34.0 pg   MCHC 30.8 30.0 - 36.0 g/dL   RDW 78.2 95.6 - 21.3 %   Platelets 311 150 - 400 K/uL   nRBC 0.0 0.0 - 0.2 %   Neutrophils Relative % 77 %   Neutro Abs 9.2 (H) 1.7 - 7.7 K/uL   Lymphocytes Relative 16 %   Lymphs Abs 1.9 0.7 - 4.0 K/uL   Monocytes Relative 5 %   Monocytes Absolute 0.6 0.1 - 1.0 K/uL   Eosinophils Relative 1 %   Eosinophils Absolute 0.1 0.0 - 0.5 K/uL   Basophils Relative 0 %   Basophils Absolute 0.0 0.0 - 0.1 K/uL   Immature Granulocytes 1 %   Abs Immature Granulocytes 0.07 0.00 - 0.07 K/uL    Comment: Performed at [facility], [address] [place], [name] [address]  Acetaminophen level     Status: Abnormal   Collection Time: 10/05/18  6:18 PM  Result Value Ref Range   Acetaminophen (Tylenol), Serum <10 (L) 10 - 30 ug/mL    Comment: (NOTE) Therapeutic concentrations vary significantly. A range of 10-30 ug/mL  may be an effective concentration for many patients. However, some  are best treated at concentrations outside of this range. Acetaminophen concentrations >150 ug/mL at 4 hours after ingestion  and >50 ug/mL at 12 hours after ingestion are often associated with  toxic reactions. Performed at [facility], [address] [place], [name] [address]   Comprehensive metabolic panel     Status: Abnormal   Collection Time: 10/05/18  6:18 PM  Result Value Ref Range   Sodium 141 135 - 145 mmol/L   Potassium 3.5 3.5 - 5.1 mmol/L   Chloride 104 98 - 111 mmol/L   CO2 27 22 - 32 mmol/L   Glucose, Bld 161 (H) 70 - 99 mg/dL   BUN 11 6 - 20 mg/dL   Creatinine, Ser 2.95 0.44 - 1.00 mg/dL   Calcium 9.2 8.9 - 28.4 mg/dL   Total Protein 7.7 6.5 - 8.1 g/dL   Albumin 4.0 3.5 - 5.0 g/dL   AST 22 15 -  41 U/L   ALT 23 0 - 44 U/L   Alkaline Phosphatase 83 38 - 126 U/L   Total Bilirubin 0.3 0.3  - 1.2 mg/dL   GFR calc non Af Amer >60 >60 mL/min   GFR calc Af Amer >60 >60 mL/min   Anion gap 10 5 - 15    Comment: Performed at [facility], [address] [place], [name] [address]  Ethanol     Status: None   Collection Time: 10/05/18  6:18 PM  Result Value Ref Range   Alcohol, Ethyl (B) <10 <10 mg/dL    Comment: (NOTE) Lowest detectable limit for serum alcohol is 10 mg/dL. For medical purposes only. Performed at [facility], [address] [place], [name] [address]   Salicylate level     Status: None   Collection Time: 10/05/18  6:18 PM  Result Value Ref Range   Salicylate Lvl <7.0 2.8 - 30.0 mg/dL    Comment: Performed at [facility], [address] [place], [name] [address]  Urine Drug Screen, Qualitative ([facility] only)     Status: Abnormal   Collection Time: 10/05/18  7:08 PM  Result Value Ref Range   Tricyclic, Ur Screen NONE DETECTED NONE DETECTED   Amphetamines, Ur Screen NONE DETECTED NONE DETECTED   MDMA (Ecstasy)Ur Screen NONE DETECTED NONE DETECTED   Cocaine Metabolite,Ur [place] NONE DETECTED NONE DETECTED   Opiate, Ur Screen NONE DETECTED NONE DETECTED   Phencyclidine (PCP) Ur S NONE DETECTED NONE DETECTED   Cannabinoid 50 Ng, Ur [place] NONE DETECTED NONE DETECTED   Barbiturates, Ur Screen NONE DETECTED NONE DETECTED   Benzodiazepine, Ur Scrn POSITIVE (A) NONE DETECTED   Methadone Scn, Ur NONE DETECTED NONE DETECTED    Comment: (NOTE) Tricyclics + metabolites, urine    Cutoff 1000 ng/mL Amphetamines + metabolites, urine  Cutoff 1000 ng/mL MDMA (Ecstasy), urine              Cutoff 500 ng/mL Cocaine Metabolite, urine          Cutoff 300 ng/mL Opiate + metabolites, urine        Cutoff 300 ng/mL Phencyclidine (PCP), urine         Cutoff 25 ng/mL Cannabinoid, urine                 Cutoff 50 ng/mL Barbiturates + metabolites, urine  Cutoff 200 ng/mL Benzodiazepine, urine              Cutoff 200 ng/mL Methadone, urine                    Cutoff 300 ng/mL The urine drug screen provides only a preliminary, unconfirmed analytical test result and should not be used for non-medical purposes. Clinical consideration and professional judgment should be applied to any positive drug screen result due to possible interfering substances. A more specific alternate chemical method must be used in order to obtain a confirmed analytical result. Gas chromatography / mass spectrometry (GC/MS) is the preferred confirmat ory method. Performed at [facility], [address] [place], [name] [address]   Urinalysis, Complete w Microscopic     Status: Abnormal   Collection Time: 10/05/18  7:08 PM  Result Value Ref Range   Color, Urine YELLOW (A) YELLOW   APPearance CLEAR (A) CLEAR   Specific Gravity, Urine 1.016 1.005 - 1.030   pH 5.0 5.0 - 8.0   Glucose, UA NEGATIVE NEGATIVE mg/dL   Hgb urine dipstick  SMALL (A) NEGATIVE   Bilirubin Urine NEGATIVE NEGATIVE   Ketones, ur NEGATIVE NEGATIVE mg/dL   Protein, ur NEGATIVE NEGATIVE mg/dL   Nitrite NEGATIVE NEGATIVE   Leukocytes,Ua NEGATIVE NEGATIVE   RBC / HPF 0-5 0 - 5 RBC/hpf   WBC, UA 0-5 0 - 5 WBC/hpf   Bacteria, UA NONE SEEN NONE SEEN   Squamous Epithelial / LPF 0-5 0 - 5   Mucus PRESENT     Comment: Performed at [facility], [address] [place], [name] [address]  Pregnancy, urine     Status: None   Collection Time: 10/05/18  7:08 PM  Result Value Ref Range   Preg Test, Ur NEGATIVE NEGATIVE    Comment: Performed at [facility], [address] [place], [name] [address]    Current Facility-Administered Medications  Medication Dose Route Frequency Provider Last Rate Last Dose  . acetaminophen (TYLENOL) tablet 650 mg  650 mg Oral Q6H PRN Arnaldo Natal, MD      . asenapine (SAPHRIS) sublingual tablet 10 mg  10 mg Sublingual BID Arnaldo Natal, MD   10 mg at 10/07/18 0956  . atorvastatin (LIPITOR) tablet 10 mg  10 mg Oral Daily Arnaldo Natal,  MD   10 mg at 10/07/18 3086  . carbamazepine (TEGRETOL) tablet 400 mg  400 mg Oral BID Arnaldo Natal, MD   400 mg at 10/07/18 5784  . cholecalciferol (VITAMIN D3) tablet 1,000 Units  1,000 Units Oral Daily Arnaldo Natal, MD   1,000 Units at 10/07/18 [phone]  . clonazePAM (KLONOPIN) tablet 1 mg  1 mg Oral TID Arnaldo Natal, MD   1 mg at 10/07/18 0953  . clotrimazole (LOTRIMIN) 1 % cream 1 application  1 application Topical BID PRN Arnaldo Natal, MD      . Desoximetasone 0.25 % LIQD 1 application  1 application Topical Daily PRN Arnaldo Natal, MD      . Fluocinolone Acetonide Scalp 0.01 % OIL 1-5 application  1-5 application Apply externally See admin instructions Arnaldo Natal, MD      . fluticasone (FLONASE) 50 MCG/ACT nasal spray 2 spray  2 spray Each Nare Daily PRN Arnaldo Natal, MD      . gabapentin (NEURONTIN) tablet 600 mg  600 mg Oral TID Arnaldo Natal, MD   600 mg at 10/07/18 0954  . levothyroxine (SYNTHROID) tablet 175 mcg  175 mcg Oral QAC breakfast Arnaldo Natal, MD   175 mcg at 10/07/18 0851  . lisinopril (ZESTRIL) tablet 20 mg  20 mg Oral Daily Arnaldo Natal, MD   20 mg at 10/07/18 0954  . lithium carbonate capsule 900 mg  900 mg Oral QHS Arnaldo Natal, MD   900 mg at 10/06/18 2131  . LORazepam (ATIVAN) tablet 2-4 mg  2-4 mg Oral See admin instructions Arnaldo Natal, MD      . magnesium hydroxide (MILK OF MAGNESIA) suspension 30 mL  30 mL Oral QHS PRN Arnaldo Natal, MD      . metFORMIN (GLUCOPHAGE) tablet 500 mg  500 mg Oral BID Arnaldo Natal, MD   500 mg at 10/07/18 0954  . mometasone (ELOCON) 0.1 % cream 1 application  1 application Topical Daily PRN Arnaldo Natal, MD      . propranolol (INDERAL) tablet 40 mg  40 mg Oral BID Arnaldo Natal, MD   40 mg at 10/07/18 0955  . QUEtiapine (SEROQUEL XR)  24 hr tablet 300 mg  300 mg Oral QHS Arnaldo NatalMalinda, Paul F, MD   300 mg at 10/06/18 2131  . QUEtiapine (SEROQUEL) tablet 25 mg  25 mg Oral TID Charm RingsLord, Jamison Y, NP   25 mg  at 10/07/18 [id number]   Current Outpatient Medications  Medication Sig Dispense Refill  . acetaminophen (TYLENOL) 325 MG tablet Take 650 mg by mouth every 6 (six) hours as needed for mild pain, moderate pain or fever.    . Asenapine Maleate (SAPHRIS) 10 MG SUBL Place 10 mg under the tongue 2 (two) times daily.    [name]. atorvastatin (LIPITOR) 10 MG tablet Take 10 mg by mouth daily.     . carbamazepine (TEGRETOL) 200 MG tablet Take 400 mg by mouth 2 (two) times daily.    . cholecalciferol (VITAMIN D) 1000 units tablet Take 1,000 Units by mouth daily.     . clonazePAM (KLONOPIN) 1 MG tablet Take 1 mg by mouth 3 (three) times daily.     . clotrimazole (LOTRIMIN) 1 % cream Apply 1 application topically 2 (two) times daily as needed (yeast under the breasts).     . Desoximetasone (TOPICORT) 0.25 % LIQD Apply 1 application topically daily as needed (skin issue).    . Fluocinolone Acetonide Scalp (DERMA-SMOOTHE/FS SCALP) 0.01 % OIL Apply 1-5 application topically See admin instructions. Apply to scalp every week for psoriasis but may increase up to 5 days per week as needed.    . fluticasone (FLONASE) 50 MCG/ACT nasal spray Place 2 sprays into the nose daily as needed for allergies.     [name]. gabapentin (NEURONTIN) 600 MG tablet Take 600 mg by mouth 3 (three) times daily.    [name]. levothyroxine (SYNTHROID, LEVOTHROID) 175 MCG tablet Take 175 mcg by mouth daily before breakfast.    . lisinopril (PRINIVIL,ZESTRIL) 20 MG tablet Take 20 mg by mouth daily.     [name]. lithium carbonate 300 MG capsule Take 900 mg by mouth at bedtime.     [name]. LORazepam (ATIVAN) 2 MG tablet Take 2-4 mg by mouth See admin instructions. Take 1 tablet (2mg ) by mouth daily as scheduled and 1 tablet (2mg ) by mouth daily as needed for anxiety    . magnesium hydroxide (MILK OF MAGNESIA) 400 MG/5ML suspension Take 30 mLs by mouth at bedtime as needed for moderate constipation.     . medroxyPROGESTERone (DEPO-PROVERA) 150 MG/ML injection Inject 150 mg into the  muscle every 3 (three) months.     . metFORMIN (GLUCOPHAGE) 500 MG tablet Take 500 mg by mouth 2 (two) times daily.     . mometasone (ELOCON) 0.1 % cream Apply 1 application topically daily as needed (Itching of the ears).     . Multiple Vitamin (THEREMS PO) Take 1 tablet by mouth daily.    . Nutritional Supplements (JOINT FORMULA PO) Take 1 tablet by mouth 3 (three) times daily.    . Omega-3 1000 MG CAPS Take 1,000 mg by mouth 2 (two) times daily.    . Paliperidone Palmitate ER (INVEGA TRINZA) 819 MG/2.625ML SUSY Inject 819 mg into the muscle every 3 (three) months.    . propranolol (INDERAL) 40 MG tablet Take 40 mg by mouth 2 (two) times daily.    . QUEtiapine (SEROQUEL XR) 300 MG 24 hr tablet Take 300 mg by mouth at bedtime.      Musculoskeletal: Strength & Muscle Tone: within normal limits Gait & Station: normal Patient leans: N/A  Psychiatric Specialty Exam: Physical Exam  Nursing note  and vitals reviewed. Constitutional: She appears well-developed and well-nourished. No distress.  HENT:  Head: Normocephalic and atraumatic.  Eyes: EOM are normal.  Neck: Normal range of motion.  Cardiovascular: Normal rate and regular rhythm.  Respiratory: Effort normal. No respiratory distress.  Musculoskeletal: Normal range of motion.  Neurological: She is alert.  Psychiatric: She has a normal mood and affect. Her speech is normal and behavior is normal. Thought content is not paranoid. She expresses impulsivity. She expresses suicidal ideation. She expresses no homicidal ideation. She expresses suicidal plans.    Review of Systems  Constitutional: Negative.   HENT: Negative.   Eyes: Negative.   Respiratory: Negative.   Cardiovascular: Negative.   Gastrointestinal: Negative.   Musculoskeletal: Negative.   Skin: Negative.   Neurological: Negative.   Psychiatric/Behavioral: Negative.     Blood pressure 122/61, pulse 89, temperature 97.8 F (36.6 C), temperature source Oral, resp. rate  16, height 5\' 3"  (1.6 m), weight 131.1 kg, SpO2 97 %.Body mass index is 51.19 kg/m.  General Appearance: Casual  Eye Contact:  Fair  Speech:  Clear and Coherent  Volume:  Decreased  Mood:  Euthymic  Affect:  Constricted  Thought Process:  Goal Directed  Orientation:  Full (Time, Place, and Person)  Thought Content:  Logical  Suicidal Thoughts:  No  Homicidal Thoughts:  No  Memory:  Immediate;   Fair Recent;   Fair Remote;   Fair  Judgement:  Impaired  Insight:  Shallow  Psychomotor Activity:  Decreased  Concentration:  Concentration: Fair  Recall:  FiservFair  Fund of Knowledge:  Fair  Language:  Fair  Akathisia:  No  Handed:  Right  AIMS (if indicated):     Assets:  Desire for Improvement Housing Resilience  ADL's:  Intact  Cognition:  Impaired,  Mild  Sleep:   Adequate    Treatment Plan Summary: Daily contact with patient to assess and evaluate symptoms and progress in treatment and Medication management  Discontinue Saphris, discontinue gabapentin due to ineffectiveness. Continue Seroquel 25 mg 3 times daily and increase Seroquel XR to 400 mg at bedtime. Continue lithium at regular dose. TSH and lithium levels are pending. Continue usual home dose of Tegretol, Klonopin, and Ativan (PRN)  Disposition: Supportive therapy provided about ongoing stressors. Will monitor patient overnight and reevaluate after medication changes.  Mariel CraftSHEILA M Fayez Sturgell, MD 10/07/2018 10:25 AM

## 2018-10-07 NOTE — ED Notes (Signed)
Ate all breakfast and then took a shower.

## 2018-10-08 DIAGNOSIS — F259 Schizoaffective disorder, unspecified: Secondary | ICD-10-CM | POA: Diagnosis not present

## 2018-10-08 DIAGNOSIS — F79 Unspecified intellectual disabilities: Secondary | ICD-10-CM

## 2018-10-08 MED ORDER — QUETIAPINE FUMARATE 25 MG PO TABS: 25.0000 mg | ORAL_TABLET | Freq: Three times a day (TID) | ORAL | 2 refills | Status: DC

## 2018-10-08 MED ORDER — QUETIAPINE FUMARATE ER 400 MG PO TB24: 400.0000 mg | ORAL_TABLET | Freq: Every day | ORAL | 2 refills | Status: DC

## 2018-10-08 NOTE — ED Notes (Signed)
Pt given breakfast tray

## 2018-10-08 NOTE — Consult Note (Signed)
[facility] Face-to-Face Psychiatry Consult Follow-Up  Reason for Consult: Patient presented with command auditory hallucinations which are now controlled with medication adjustment. Referring Physician: Roxan Hockey Patient Identification: Dawn Lambert MRN:  [id number] Principal Diagnosis: <principal problem not specified> Diagnosis:   Patient Active Problem List   Diagnosis Date Noted  . Adjustment disorder with mixed disturbance of emotions and conduct [F43.25] 10/12/2017  . Intellectual disability [F79] 10/12/2017  . Umbilical hernia without obstruction and without gangrene [K42.9] 08/15/2017  . Chest pain [R07.9] 11/05/2012  . Atrial septal defect [Q21.1] 09/19/2011   Patient seen, chart is reviewed.  Collateral obtained and discharge planning with medication reconciliation completed with group home owner, [facility]ursa Total Time spent with patient: 45 minutes  Subjective: " "The medicines have worked, I am not having any auditory hallucinations anymore.  I do not want to put  HPI: Dawn Lambert is a 28 y.o. female who presented from her group home after endorsing AH and suicidal ideation with out plan. Pt admits to exposing herself and running away from the facility. She states " I was in the [place] naked."She reports that she does not feel safe and that she began having hallucinations and SI a few days ago. Pt states that voices tell her that staff will hurt there. She also admit to having thoughts of hurting staff. Pt states " I want to scare them and hit them." Pt denied any active SI, plan or intent. She denies the presence of paranoia and reports sleeping well.  10/05/2018 Collateral per TTS: ( Mr. Nedra Hai: [phone]) to gather collateral information. He states that the pt stated that she wanted to go to jail today. He reports that she attempted to runaway on several occassions. Per his report she exposed her self, he states that this is typical behavior for the pt. Per his report the pt also  walked out into the street naked. He states that his was unaware that he pt had began to experience hallucinations. Mr Nedra Hai confirms that this is a new symptom for the pt. He has agreed to pick the pt up if and when discharge is recommended.   10/07/2018: Patient reports that she has continued to have command auditory hallucinations telling her to hurt herself.  She states that she is being instructed to break glass and cut her wrists.  Patient does have access to these items, and does endorse that she believes she would do these things.  Patient denies homicidal ideation, she is denying visual hallucinations.  Patient denies other problems at the group home, and "wants to get well in order to return back to her home." Patient has informed nurses that she feels like she needs more medication to stop her auditory hallucinations.  She also states that she misses being able to see her mom due to the COVID-19 quarantine.  She is only allowed to speak to her mom by phone twice a week, and would like to be able to speak to her mom 5 times a week.  10/08/2018: Patient is awake and alert this morning.  She states that she slept well with medication changes.  She notes that she has not had any auditory hallucinations since her medications were changed.  She specifically denies any command auditory hallucinations.  Patient specifically denies any suicidal ideation, plan, or intent.  She is denying homicidal ideation.  She is denying any visual hallucinations.  Patient has had no behavioral issues while in the emergency department.  Spoke with [place], from group home  and reviewed medication reconciliation with changes.  Prescriptions have been sent to pharmacy.  Group home and patient are agreeable to contract for safety.  Is today having home group home that patient is requesting more contact with mother, as she has missed having time with her during COVID-19 quarantine.  From record review with updates: Medical history:  Obese.  Intellectual disability.  Multiple other congenital problems.  Substance abuse history: No alcohol or drug abuse  Social history: Has been living in the same group home for many years.  Admits that she gets along fine with them most of the time.  Past Psychiatric History: No previous psychiatric hospitalization.  No previous suicide attempts no history of violence.  Patient clearly has intellectual disability.  She says that she has been manipulative in the past to try to get her way.  Risk to Self: Suicidal Ideation: Yes-Currently Present Suicidal Intent: Yes-Currently Present Is patient at risk for suicide?: Yes Suicidal Plan?: No What has been your use of drugs/alcohol within the last 12 months?: none How many times?: 0 Other Self Harm Risks: none noted Intentional Self Injurious Behavior: None Risk to Others: Homicidal Ideation: No Thoughts of Harm to Others: Yes-Currently Present Comment - Thoughts of Harm to Others: to scare and hit staff Current Homicidal Intent: No Current Homicidal Plan: No Access to Homicidal Means: No Identified Victim: [facility] Staff History of harm to others?: No Assessment of Violence: None Noted Violent Behavior Description: none noted Does patient have access to weapons?: No Criminal Charges Pending?: No Does patient have a court date: No Prior Inpatient Therapy: Prior Inpatient Therapy: No Prior Outpatient Therapy: Prior Outpatient Therapy: Yes Prior Therapy Dates: current  [facility] Does patient have an ACCT team?: No Does patient have Intensive In-House Services?  : No Does patient have [facility] services? : No Does patient have P4CC services?: No  Past Medical History:  Past Medical History:  Diagnosis Date  . Anxiety   . Arnold-Chiari malformation (HCC)   . Atrial septal defect   . Complication of anesthesia    when pt was 11, woke up during surgery (muscle biopsy)  . Constipation   .  Contraception management   . Dandruff   . Dermatomyositis (HCC)    age 89  . Diabetes mellitus without complication (HCC)   . Elevated blood pressure   . Hypertension   . Hypothyroidism   . Mental retardation   . Mood disorder (HCC)   . Overweight(278.02)   . Pelvic pain   . Prediabetes     Past Surgical History:  Procedure Laterality Date  . ASD REPAIR     at age 89  . UMBILICAL HERNIA REPAIR N/A 09/17/2017   Procedure: HERNIA REPAIR UMBILICAL ADULT;  Surgeon: [name], [name], MD;  Location: [facility] ORS;  Service: General;  Laterality: N/A;   Family History: History reviewed. No pertinent family history.   Family Psychiatric  History: Unknown   Social History:  Social History   Substance and Sexual Activity  Alcohol Use No     Social History   Substance and Sexual Activity  Drug Use No    Social History   Socioeconomic History  . Marital status: Single    Spouse name: Not on file  . Number of children: Not on file  . Years of education: Not on file  . Highest education level: Not on file  Occupational History  . Not on file  Social Needs  . Financial resource strain:  Not on file  . Food insecurity:    Worry: Not on file    Inability: Not on file  . Transportation needs:    Medical: Not on file    Non-medical: Not on file  Tobacco Use  . Smoking status: Never Smoker  . Smokeless tobacco: Never Used  Substance and Sexual Activity  . Alcohol use: No  . Drug use: No  . Sexual activity: Not on file  Lifestyle  . Physical activity:    Days per week: Not on file    Minutes per session: Not on file  . Stress: Not on file  Relationships  . Social connections:    Talks on phone: Not on file    Gets together: Not on file    Attends religious service: Not on file    Active member of club or organization: Not on file    Attends meetings of clubs or organizations: Not on file    Relationship status: Not on file  Other Topics Concern  . Not on file  Social  History Narrative  . Not on file   Additional Social History:   Lives in group home, patient is welcome back to group home after psychiatrically stabilized.  Allergies:   Allergies  Allergen Reactions  . Augmentin [Amoxicillin-Pot Clavulanate] Other (See Comments)    Has patient had a PCN reaction causing immediate rash, facial/tongue/throat swelling, SOB or lightheadedness with hypotension: ______ Has patient had a PCN reaction causing severe rash involving mucus membranes or skin necrosis: _______ Has patient had a PCN reaction that required hospitalization: _______ Has patient had a PCN reaction occurring within the last 10 years:_______ If all of the above answers are "NO", then may proceed with Cephalosporin use.     Labs:  No results found for this or any previous visit (from the past 48 hour(s)).  Current Facility-Administered Medications  Medication Dose Route Frequency Provider Last Rate Last Dose  . acetaminophen (TYLENOL) tablet 650 mg  650 mg Oral Q6H PRN Arnaldo Natal, MD      . atorvastatin (LIPITOR) tablet 10 mg  10 mg Oral Daily Arnaldo Natal, MD   10 mg at 10/07/18 4098  . carbamazepine (TEGRETOL) tablet 400 mg  400 mg Oral BID Arnaldo Natal, MD   400 mg at 10/08/18 0933  . cholecalciferol (VITAMIN D3) tablet 1,000 Units  1,000 Units Oral Daily Arnaldo Natal, MD   1,000 Units at 10/08/18 0933  . clonazePAM (KLONOPIN) tablet 1 mg  1 mg Oral TID Arnaldo Natal, MD   1 mg at 10/08/18 0933  . clotrimazole (LOTRIMIN) 1 % cream 1 application  1 application Topical BID PRN Arnaldo Natal, MD      . Desoximetasone 0.25 % LIQD 1 application  1 application Topical Daily PRN Arnaldo Natal, MD      . Fluocinolone Acetonide Scalp 0.01 % OIL 1-5 application  1-5 application Apply externally See admin instructions Arnaldo Natal, MD      . fluticasone (FLONASE) 50 MCG/ACT nasal spray 2 spray  2 spray Each Nare Daily PRN Arnaldo Natal, MD      . levothyroxine  (SYNTHROID) tablet 175 mcg  175 mcg Oral QAC breakfast Arnaldo Natal, MD   175 mcg at 10/08/18 0934  . lisinopril (ZESTRIL) tablet 20 mg  20 mg Oral Daily Arnaldo Natal, MD   20 mg at 10/07/18 0954  . lithium carbonate capsule 900 mg  900 mg  Oral QHS Arnaldo NatalMalinda, Paul F, MD   900 mg at 10/07/18 2045  . LORazepam (ATIVAN) tablet 2-4 mg  2-4 mg Oral See admin instructions Arnaldo NatalMalinda, Paul F, MD      . magnesium hydroxide (MILK OF MAGNESIA) suspension 30 mL  30 mL Oral QHS PRN Arnaldo NatalMalinda, Paul F, MD      . metFORMIN (GLUCOPHAGE) tablet 500 mg  500 mg Oral BID Arnaldo NatalMalinda, Paul F, MD   500 mg at 10/08/18 0933  . mometasone (ELOCON) 0.1 % cream 1 application  1 application Topical Daily PRN Arnaldo NatalMalinda, Paul F, MD      . propranolol (INDERAL) tablet 40 mg  40 mg Oral BID Arnaldo NatalMalinda, Paul F, MD   40 mg at 10/08/18 0932  . QUEtiapine (SEROQUEL XR) 24 hr tablet 400 mg  400 mg Oral QHS Mariel CraftMaurer, Analisse Randle M, MD   400 mg at 10/07/18 2044  . QUEtiapine (SEROQUEL) tablet 25 mg  25 mg Oral TID Charm RingsLord, Jamison Y, NP   25 mg at 10/08/18 [id number]   Current Outpatient Medications  Medication Sig Dispense Refill  . acetaminophen (TYLENOL) 325 MG tablet Take 650 mg by mouth every 6 (six) hours as needed for mild pain, moderate pain or fever.    [name]. atorvastatin (LIPITOR) 10 MG tablet Take 10 mg by mouth daily.     . carbamazepine (TEGRETOL) 200 MG tablet Take 400 mg by mouth 2 (two) times daily.    . cholecalciferol (VITAMIN D) 1000 units tablet Take 1,000 Units by mouth daily.     . clonazePAM (KLONOPIN) 1 MG tablet Take 1 mg by mouth 3 (three) times daily.     . clotrimazole (LOTRIMIN) 1 % cream Apply 1 application topically 2 (two) times daily as needed (yeast under the breasts).     . Desoximetasone (TOPICORT) 0.25 % LIQD Apply 1 application topically daily as needed (skin issue).    . Fluocinolone Acetonide Scalp (DERMA-SMOOTHE/FS SCALP) 0.01 % OIL Apply 1-5 application topically See admin instructions. Apply to scalp every week for  psoriasis but may increase up to 5 days per week as needed.    . fluticasone (FLONASE) 50 MCG/ACT nasal spray Place 2 sprays into the nose daily as needed for allergies.     [name]. gabapentin (NEURONTIN) 600 MG tablet Take 600 mg by mouth 3 (three) times daily.    [name]. levothyroxine (SYNTHROID, LEVOTHROID) 175 MCG tablet Take 175 mcg by mouth daily before breakfast.    . lisinopril (PRINIVIL,ZESTRIL) 20 MG tablet Take 20 mg by mouth daily.     [name]. lithium carbonate 300 MG capsule Take 900 mg by mouth at bedtime.     [name]. LORazepam (ATIVAN) 2 MG tablet Take 2-4 mg by mouth See admin instructions. Take 1 tablet (2mg ) by mouth daily as scheduled and 1 tablet (2mg ) by mouth daily as needed for anxiety    . magnesium hydroxide (MILK OF MAGNESIA) 400 MG/5ML suspension Take 30 mLs by mouth at bedtime as needed for moderate constipation.     . medroxyPROGESTERone (DEPO-PROVERA) 150 MG/ML injection Inject 150 mg into the muscle every 3 (three) months.     . metFORMIN (GLUCOPHAGE) 500 MG tablet Take 500 mg by mouth 2 (two) times daily.     . mometasone (ELOCON) 0.1 % cream Apply 1 application topically daily as needed (Itching of the ears).     . Multiple Vitamin (THEREMS PO) Take 1 tablet by mouth daily.    . Nutritional Supplements (JOINT FORMULA PO) Take 1  tablet by mouth 3 (three) times daily.    . Omega-3 1000 MG CAPS Take 1,000 mg by mouth 2 (two) times daily.    . Paliperidone Palmitate ER (INVEGA TRINZA) 819 MG/2.625ML SUSY Inject 819 mg into the muscle every 3 (three) months.    . propranolol (INDERAL) 40 MG tablet Take 40 mg by mouth 2 (two) times daily.    . QUEtiapine (SEROQUEL XR) 400 MG 24 hr tablet Take 1 tablet (400 mg total) by mouth at bedtime. 60 tablet 2  . QUEtiapine (SEROQUEL) 25 MG tablet Take 1 tablet (25 mg total) by mouth 3 (three) times daily with meals. 90 tablet 2    Musculoskeletal: Strength & Muscle Tone: within normal limits Gait & Station: normal Patient leans: N/A  Psychiatric  Specialty Exam: Physical Exam  Nursing note and vitals reviewed. Constitutional: She appears well-developed and well-nourished. No distress.  HENT:  Head: Normocephalic and atraumatic.  Eyes: EOM are normal.  Neck: Normal range of motion.  Cardiovascular: Normal rate and regular rhythm.  Respiratory: Effort normal. No respiratory distress.  Musculoskeletal: Normal range of motion.  Neurological: She is alert.  Psychiatric: She has a normal mood and affect. Her speech is normal and behavior is normal. Thought content is not paranoid. She does not express impulsivity or inappropriate judgment. She expresses no homicidal and no suicidal ideation.    Review of Systems  Constitutional: Negative.   HENT: Negative.   Eyes: Negative.   Respiratory: Negative.   Cardiovascular: Negative.   Gastrointestinal: Negative.   Musculoskeletal: Negative.   Skin: Negative.   Neurological: Negative.   Psychiatric/Behavioral: Negative.     Blood pressure 130/90, pulse 89, temperature 98.3 F (36.8 C), temperature source Oral, resp. rate 18, height 5\' 3"  (1.6 m), weight 131.1 kg, SpO2 100 %.Body mass index is 51.19 kg/m.  General Appearance: Casual  Eye Contact:  Good  Speech:  Clear and Coherent  Volume:  Normal  Mood:  Euthymic  Affect:  Constricted  Thought Process:  Goal Directed  Orientation:  Full (Time, Place, and Person)  Thought Content:  Logical  Suicidal Thoughts:  No  Homicidal Thoughts:  No  Memory:  Immediate;   Fair Recent;   Fair Remote;   Fair  Judgement:  Fair  Insight:  Present  Psychomotor Activity:  Normal  Concentration:  Concentration: Fair  Recall:  [employer] of Knowledge:  Fair  Language:  Fair  Akathisia:  No  Handed:  Right  AIMS (if indicated):     Assets:  Communication Skills Desire for Improvement Financial Resources/Insurance Housing Resilience Social Support  ADL's:  Intact  Cognition:  Impaired,  Mild  Sleep:   Adequate    Treatment Plan  Summary: Daily contact with patient to assess and evaluate symptoms and progress in treatment and Medication management  Discontinue Saphris due to ineffectiveness. Continue Seroquel 25 mg 3 times daily and increase Seroquel XR to 400 mg at bedtime. Continue lithium at regular dose.  Labs are reviewed, lithium level is low on review lithium level has been low at hospital lab checks for the past 2 years.  I have encouraged caregiver that patient needs to follow-up with outpatient prescriber.  TSH and CMP are within normal limits. Continue usual home dose of Tegretol, Klonopin, gabapentin and Ativan (PRN).  Patient did not receive gabapentin while in the hospital, if patient seems overly sedated as an outpatient, this medication can be tapered off.  Disposition: No evidence of imminent risk to self  or others at present.   Patient does not meet criteria for psychiatric inpatient admission. Supportive therapy provided about ongoing stressors. Discussed crisis plan, support from social network, calling 911, coming to the Emergency Department, and calling Suicide Hotline.  Group home treatment team will review patient's request to speak more frequently with her mother.  She was able to engage in safety planning including plan to return to nearest emergency room or contact emergency services if she feels unable to maintain her own safety or the safety of others. Patient had no further questions, comments, or concerns.  Discharge into care of their home caregiver, who agrees to maintain patient safety.   Mariel CraftSHEILA M Kayleen Alig, MD 10/08/2018 10:44 AM

## 2018-10-08 NOTE — ED Provider Notes (Signed)
-----------------------------------------   11:14 AM on 10/08/2018 -----------------------------------------  Patient has been reevaluated by the psychiatrist Dr. Leverne Humbles and cleared for discharge.  Return precautions provided.   Arta Silence, MD 10/08/18 1115

## 2018-10-08 NOTE — ED Notes (Signed)
[facility], PENDING OBSERVATION

## 2018-10-08 NOTE — ED Notes (Signed)
Pt pending reeval by Dr. Leverne Humbles.

## 2018-10-08 NOTE — Discharge Instructions (Addendum)
Please ensure that Dawn Lambert receives her [name] injection as scheduled next week.  Thank you

## 2018-10-08 NOTE — ED Provider Notes (Signed)
-----------------------------------------   7:15 AM on 10/08/2018 -----------------------------------------   Blood pressure (!) 142/76, pulse 86, temperature 98.4 F (36.9 C), temperature source Oral, resp. rate 16, height 5\' 3"  (1.6 m), weight 131.1 kg, SpO2 99 %.  The patient is calm and cooperative at this time.  There have been no acute events since the last update.  Awaiting disposition plan from Behavioral Medicine team.   Arta Silence, MD 10/08/18 [phone]

## 2018-10-08 NOTE — BH Assessment (Signed)
After being evaluated by psych, pt is deemed safe to return to group home. This Probation officer spoke with [facility] ([place] Staff: [phone]) who reports a staff member will be here "in an hour" to pick-up pt at discharge.  Dr. Leverne Humbles and pt's nurse was informed of this information.

## 2019-01-01 ENCOUNTER — Other Ambulatory Visit: Payer: Self-pay | Admitting: Internal Medicine

## 2019-01-01 DIAGNOSIS — R7989 Other specified abnormal findings of blood chemistry: Secondary | ICD-10-CM

## 2019-01-01 DIAGNOSIS — R7401 Elevation of levels of liver transaminase levels: Secondary | ICD-10-CM

## 2019-01-01 DIAGNOSIS — K760 Fatty (change of) liver, not elsewhere classified: Secondary | ICD-10-CM

## 2019-01-07 ENCOUNTER — Ambulatory Visit
Admission: RE | Admit: 2019-01-07 | Discharge: 2019-01-07 | Disposition: A | Discharge status: Home / Self Care | Payer: Medicaid Other | Source: Ambulatory Visit | Attending: Internal Medicine | Admitting: Internal Medicine

## 2019-01-07 ENCOUNTER — Other Ambulatory Visit: Payer: Self-pay

## 2019-01-07 DIAGNOSIS — R740 Nonspecific elevation of levels of transaminase and lactic acid dehydrogenase [LDH]: Secondary | ICD-10-CM | POA: Diagnosis not present

## 2019-01-07 DIAGNOSIS — R945 Abnormal results of liver function studies: Secondary | ICD-10-CM | POA: Diagnosis present

## 2019-01-07 DIAGNOSIS — K760 Fatty (change of) liver, not elsewhere classified: Secondary | ICD-10-CM | POA: Diagnosis present

## 2019-01-07 DIAGNOSIS — R7401 Elevation of levels of liver transaminase levels: Secondary | ICD-10-CM

## 2019-01-07 DIAGNOSIS — R7989 Other specified abnormal findings of blood chemistry: Secondary | ICD-10-CM

## 2019-01-08 ENCOUNTER — Other Ambulatory Visit: Payer: Self-pay | Admitting: Internal Medicine

## 2019-01-08 DIAGNOSIS — R7401 Elevation of levels of liver transaminase levels: Secondary | ICD-10-CM

## 2019-02-26 ENCOUNTER — Other Ambulatory Visit: Payer: Self-pay

## 2019-02-26 ENCOUNTER — Ambulatory Visit (LOCAL_COMMUNITY_HEALTH_CENTER): Payer: Self-pay

## 2019-02-26 DIAGNOSIS — Z23 Encounter for immunization: Secondary | ICD-10-CM

## 2019-03-07 ENCOUNTER — Other Ambulatory Visit: Payer: Self-pay

## 2019-03-07 DIAGNOSIS — Z20822 Contact with and (suspected) exposure to covid-19: Secondary | ICD-10-CM

## 2019-03-08 LAB — NOVEL CORONAVIRUS, NAA: SARS-CoV-2, NAA: DETECTED — AB

## 2019-07-15 IMAGING — CR DG ABDOMEN 2V
3 series · 3 of 3 positions shown · non-contrast
Comparison: None.

CLINICAL DATA: Abdominal pain after hernia surgery.

EXAM:
ABDOMEN - 2 VIEW

[abdomen erect]
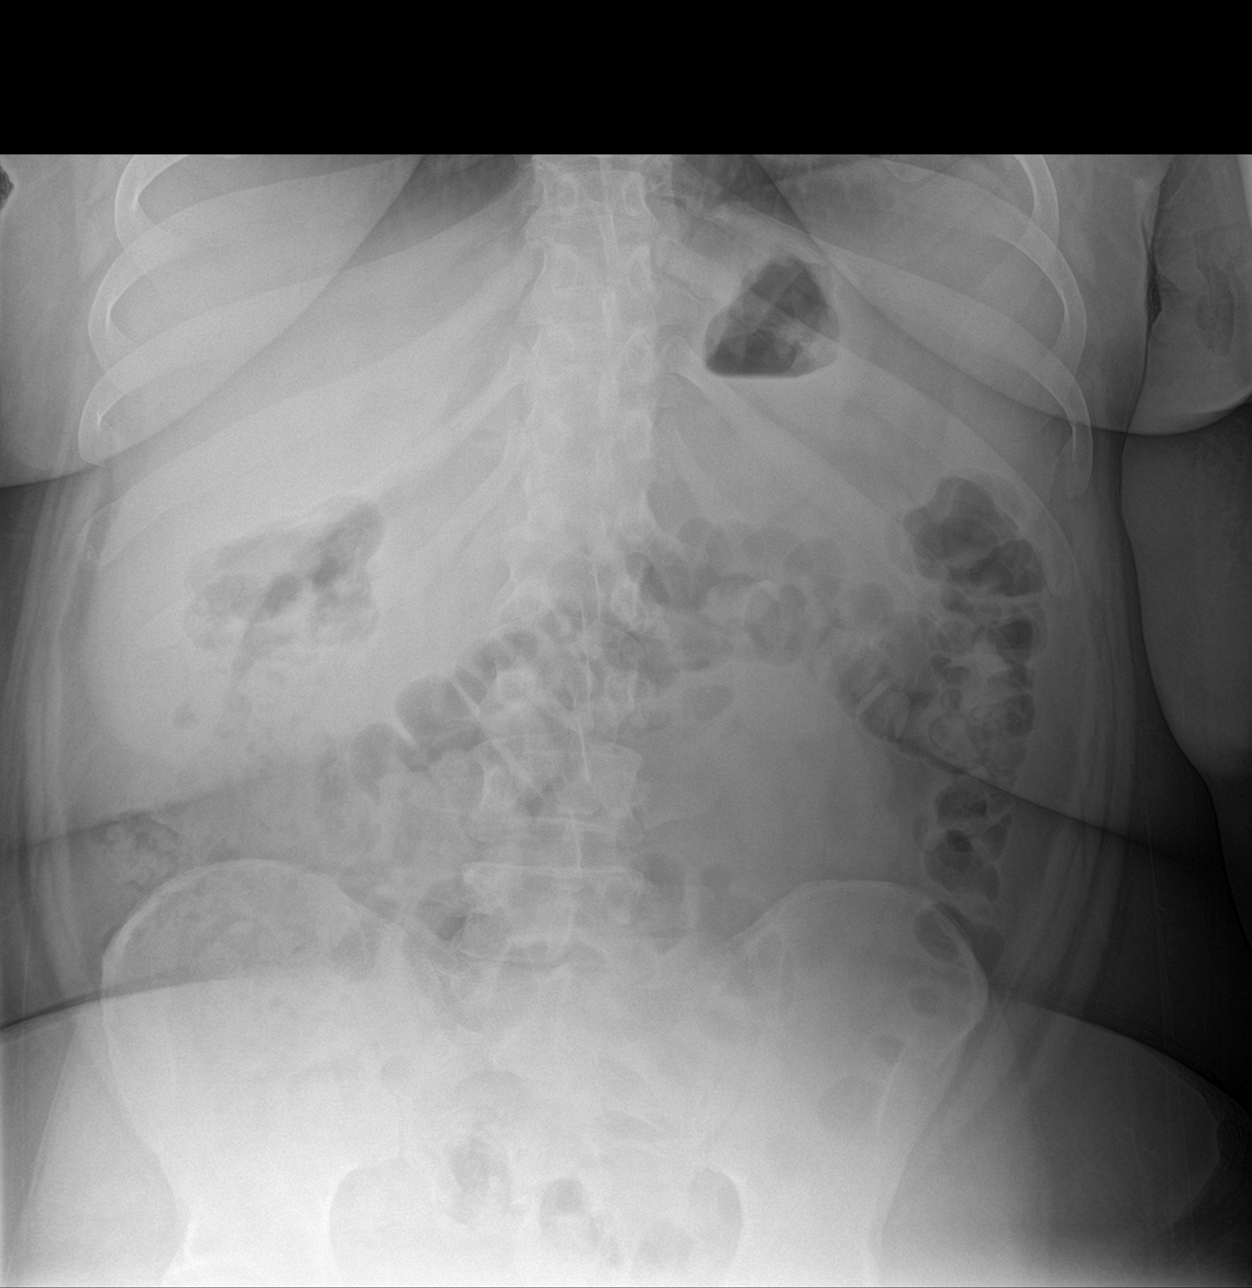

[abdomen supine (1 of 2)]
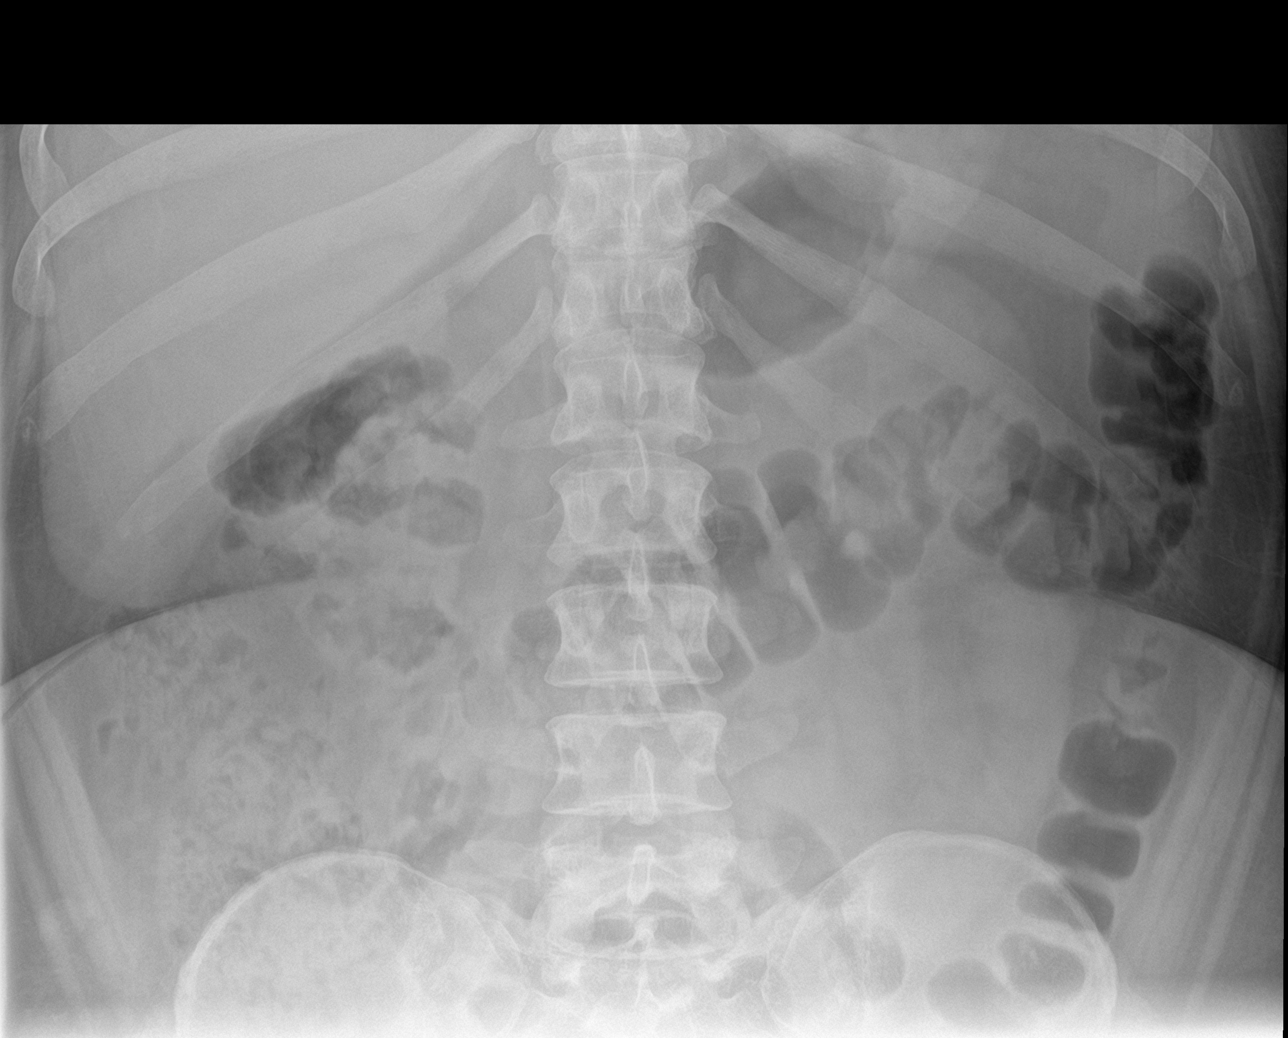

[abdomen supine (2 of 2)]
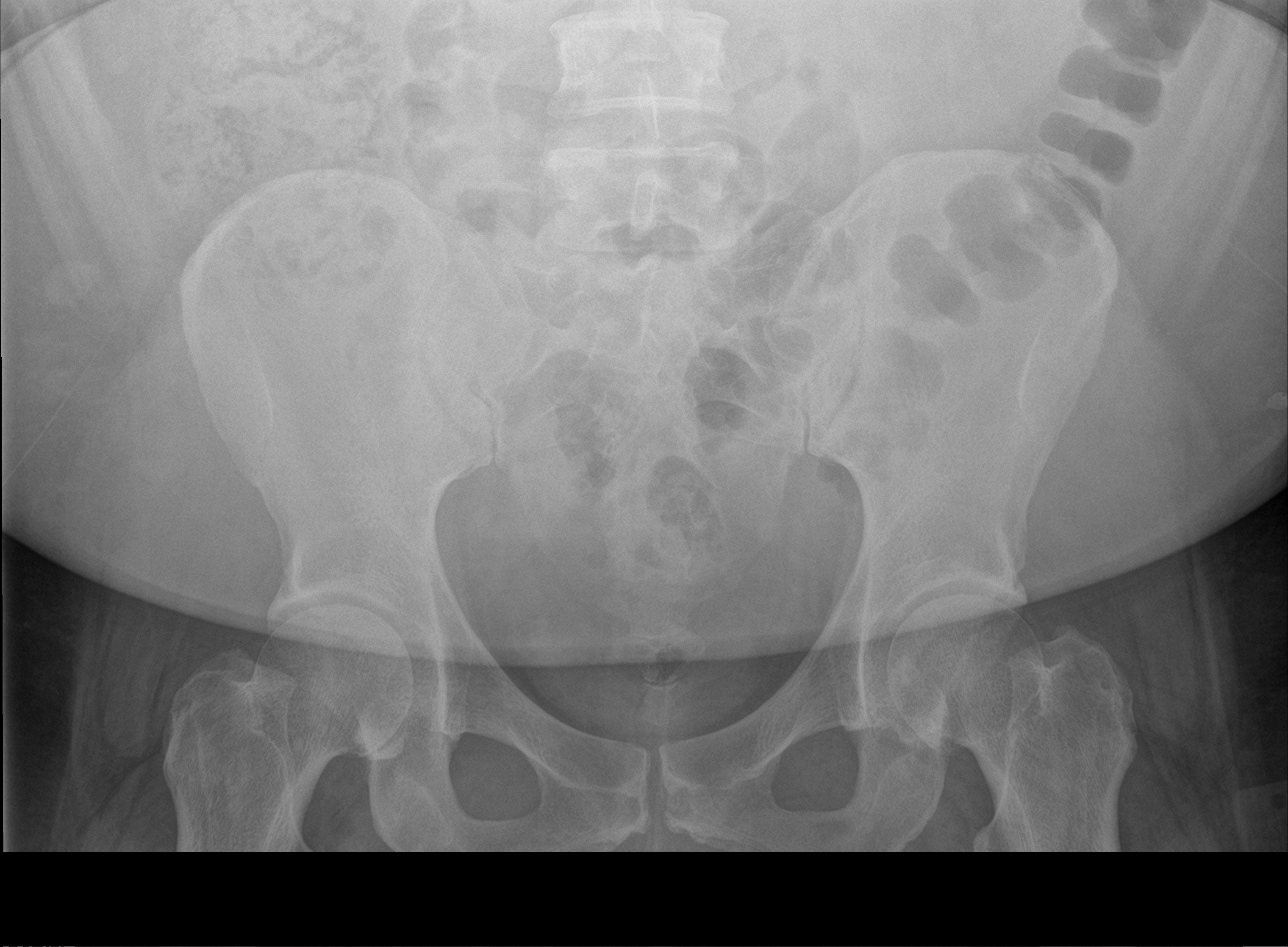

[3 of 3 positions shown; findings below may reference images not displayed]

FINDINGS: The bowel gas pattern is normal. There is no evidence of free air.
No radio-opaque calculi or other significant radiographic
abnormality is seen.
IMPRESSION: No evidence of bowel obstruction or ileus.

## 2019-11-22 ENCOUNTER — Emergency Department
Admission: EM | Admit: 2019-11-22 | Discharge: 2019-11-22 | Disposition: A | Discharge status: Home / Self Care | Payer: Medicaid Other | Attending: Emergency Medicine | Admitting: Emergency Medicine

## 2019-11-22 ENCOUNTER — Encounter: Payer: Self-pay | Admitting: Intensive Care

## 2019-11-22 ENCOUNTER — Other Ambulatory Visit: Payer: Self-pay

## 2019-11-22 DIAGNOSIS — I10 Essential (primary) hypertension: Secondary | ICD-10-CM | POA: Diagnosis not present

## 2019-11-22 DIAGNOSIS — Z79890 Hormone replacement therapy (postmenopausal): Secondary | ICD-10-CM | POA: Insufficient documentation

## 2019-11-22 DIAGNOSIS — E039 Hypothyroidism, unspecified: Secondary | ICD-10-CM | POA: Insufficient documentation

## 2019-11-22 DIAGNOSIS — R45851 Suicidal ideations: Secondary | ICD-10-CM

## 2019-11-22 DIAGNOSIS — F4325 Adjustment disorder with mixed disturbance of emotions and conduct: Secondary | ICD-10-CM

## 2019-11-22 DIAGNOSIS — Z7984 Long term (current) use of oral hypoglycemic drugs: Secondary | ICD-10-CM | POA: Diagnosis not present

## 2019-11-22 DIAGNOSIS — E119 Type 2 diabetes mellitus without complications: Secondary | ICD-10-CM | POA: Insufficient documentation

## 2019-11-22 DIAGNOSIS — Z7951 Long term (current) use of inhaled steroids: Secondary | ICD-10-CM | POA: Diagnosis not present

## 2019-11-22 DIAGNOSIS — R45850 Homicidal ideations: Secondary | ICD-10-CM

## 2019-11-22 LAB — URINE DRUG SCREEN, QUALITATIVE (ARMC ONLY)
Amphetamines, Ur Screen: NOT DETECTED
Barbiturates, Ur Screen: NOT DETECTED
Benzodiazepine, Ur Scrn: POSITIVE — AB
Cannabinoid 50 Ng, Ur ~~LOC~~: NOT DETECTED
Cocaine Metabolite,Ur ~~LOC~~: NOT DETECTED
MDMA (Ecstasy)Ur Screen: NOT DETECTED
Methadone Scn, Ur: NOT DETECTED
Opiate, Ur Screen: NOT DETECTED
Phencyclidine (PCP) Ur S: NOT DETECTED
Tricyclic, Ur Screen: POSITIVE — AB

## 2019-11-22 LAB — CBC
HCT: 35.3 % — ABNORMAL LOW (ref 36.0–46.0)
Hemoglobin: 11.3 g/dL — ABNORMAL LOW (ref 12.0–15.0)
MCH: 28.7 pg (ref 26.0–34.0)
MCHC: 32 g/dL (ref 30.0–36.0)
MCV: 89.6 fL (ref 80.0–100.0)
Platelets: 316 10*3/uL (ref 150–400)
RBC: 3.94 MIL/uL (ref 3.87–5.11)
RDW: 12.8 % (ref 11.5–15.5)
WBC: 9.7 10*3/uL (ref 4.0–10.5)
nRBC: 0 % (ref 0.0–0.2)

## 2019-11-22 LAB — ACETAMINOPHEN LEVEL: Acetaminophen (Tylenol), Serum: <10 ug/mL — ABNORMAL LOW (ref 10–30)

## 2019-11-22 LAB — COMPREHENSIVE METABOLIC PANEL
ALT: 105 U/L — ABNORMAL HIGH (ref 0–44)
AST: 37 U/L (ref 15–41)
Albumin: 3.9 g/dL (ref 3.5–5.0)
Alkaline Phosphatase: 85 U/L (ref 38–126)
Anion gap: 9 (ref 5–15)
BUN: 9 mg/dL (ref 6–20)
CO2: 25 mmol/L (ref 22–32)
Calcium: 9.3 mg/dL (ref 8.9–10.3)
Chloride: 105 mmol/L (ref 98–111)
Creatinine, Ser: 0.67 mg/dL (ref 0.44–1.00)
GFR calc Af Amer: >60 mL/min (ref >60)
GFR calc non Af Amer: >60 mL/min (ref >60)
Glucose, Bld: 140 mg/dL — ABNORMAL HIGH (ref 70–99)
Potassium: 3.9 mmol/L (ref 3.5–5.1)
Sodium: 139 mmol/L (ref 135–145)
Total Bilirubin: 0.5 mg/dL (ref 0.3–1.2)
Total Protein: 7.8 g/dL (ref 6.5–8.1)

## 2019-11-22 LAB — ETHANOL: Alcohol, Ethyl (B): <10 mg/dL (ref <10)

## 2019-11-22 LAB — POCT PREGNANCY, URINE: Preg Test, Ur: NEGATIVE

## 2019-11-22 LAB — SALICYLATE LEVEL: Salicylate Lvl: <7 mg/dL — ABNORMAL LOW (ref 7.0–30.0)

## 2019-11-22 NOTE — Consult Note (Signed)
[facility] Psych ED Discharge  11/22/2019 5:16 PM Dawn Lambert  MRN:  [id number] Principal Problem: Adjustment disorder with mixed disturbance of emotions and conduct Discharge Diagnoses: Principal Problem:   Adjustment disorder with mixed disturbance of emotions and conduct  Subjective: "I was upset I at my group home because they would not let me use my coupons for a [employer] snow cone or cake."  Patient seen and evaluated in person by this provider.  She had gotten upset at her group home because she wanted to use coupons to redeem a free snow cone at [employer] and a cake at [employer], history of IDD.  She says that she filled out paperwork to get this.  She was expressing her concern that she wanted the streets more than once a week and wanted [name] to talk to her group home.  When she had gotten upset prior to admission she also said she wanted to stab people.  She is calm and cooperative on assessment.  Her guardian, her mother, was contacted who has no safety concerns and explains that Yassmine has been completing online surveys with false information to obtain free food at different places.  She in coordination with the group home had explained this fraudulent behaviors to learn and how she had to stop the behaviors.  Pauleen was upset about this.  The group home was called and they have no safety concerns for her either and reiterated what her mother had said.  They are agreeable for her to return and will pick her up in an hour.  Discussed with Teniyah the importance of stopping the behaviors.  When this provider was making phone calls, Ola requested to speak to let everyone know that she does not want to stab anyone or harm her self.  She is psychiatrically cleared for discharge.  Total Time spent with patient: 45 minutes  Past Psychiatric History: IDD  Past Medical History:  Past Medical History:  Diagnosis Date  . Anxiety   . Arnold-Chiari malformation (HCC)   . Atrial septal defect   .  Complication of anesthesia    when pt was 11, woke up during surgery (muscle biopsy)  . Constipation   . Contraception management   . Dandruff   . Dermatomyositis (HCC)    age 29  . Diabetes mellitus without complication (HCC)   . Elevated blood pressure   . Hypertension   . Hypothyroidism   . Mental retardation   . Mood disorder (HCC)   . Overweight(278.02)   . Pelvic pain   . Prediabetes     Past Surgical History:  Procedure Laterality Date  . ASD REPAIR     at age 46  . UMBILICAL HERNIA REPAIR N/A 09/17/2017   Procedure: HERNIA REPAIR UMBILICAL ADULT;  Surgeon: Earline Mayotte, MD;  Location: [facility] ORS;  Service: General;  Laterality: N/A;   Family History: History reviewed. No pertinent family history. Family Psychiatric  History: None Social History:  Social History   Substance and Sexual Activity  Alcohol Use No     Social History   Substance and Sexual Activity  Drug Use No    Social History   Socioeconomic History  . Marital status: Single    Spouse name: Not on file  . Number of children: Not on file  . Years of education: Not on file  . Highest education level: Not on file  Occupational History  . Not on file  Tobacco Use  . Smoking status: Never Smoker  .  Smokeless tobacco: Never Used  Vaping Use  . Vaping Use: Never used  Substance and Sexual Activity  . Alcohol use: No  . Drug use: No  . Sexual activity: Not on file  Other Topics Concern  . Not on file  Social History Narrative  . Not on file   Social Determinants of Health   Financial Resource Strain:   . Difficulty of Paying Living Expenses:   Food Insecurity:   . Worried About Programme researcher, broadcasting/film/videounning Out of Food in the Last Year:   . Baristaan Out of Food in the Last Year:   Transportation Needs:   . Freight forwarderLack of Transportation (Medical):   [name]. Lack of Transportation (Non-Medical):   Physical Activity:   . Days of Exercise per Week:   . Minutes of Exercise per Session:   Stress:   . Feeling of Stress :    Social Connections:   . Frequency of Communication with Friends and Family:   . Frequency of Social Gatherings with Friends and Family:   . Attends Religious Services:   . Active Member of Clubs or Organizations:   . Attends BankerClub or Organization Meetings:   [name]. Marital Status:     Has this patient used any form of tobacco in the last 30 days? (Cigarettes, Smokeless Tobacco, Cigars, and/or Pipes) NA  Current Medications: No current facility-administered medications for this encounter.   Current Outpatient Medications  Medication Sig Dispense Refill  . acetaminophen (TYLENOL) 325 MG tablet Take 650 mg by mouth every 6 (six) hours as needed for mild pain, moderate pain or fever.    [name]. atorvastatin (LIPITOR) 10 MG tablet Take 10 mg by mouth daily.     . carbamazepine (TEGRETOL) 200 MG tablet Take 400 mg by mouth 2 (two) times daily.    . cholecalciferol (VITAMIN D) 1000 units tablet Take 1,000 Units by mouth daily.     . clonazePAM (KLONOPIN) 1 MG tablet Take 1 mg by mouth 3 (three) times daily.     . clotrimazole (LOTRIMIN) 1 % cream Apply 1 application topically 2 (two) times daily as needed (yeast under the breasts).     . Desoximetasone (TOPICORT) 0.25 % LIQD Apply 1 application topically daily as needed (skin issue).    . Fluocinolone Acetonide Scalp (DERMA-SMOOTHE/FS SCALP) 0.01 % OIL Apply 1-5 application topically See admin instructions. Apply to scalp every week for psoriasis but may increase up to 5 days per week as needed.    . fluticasone (FLONASE) 50 MCG/ACT nasal spray Place 2 sprays into the nose daily as needed for allergies.     [name]. gabapentin (NEURONTIN) 600 MG tablet Take 600 mg by mouth 3 (three) times daily.    [name]. levothyroxine (SYNTHROID, LEVOTHROID) 175 MCG tablet Take 175 mcg by mouth daily before breakfast.    . lisinopril (PRINIVIL,ZESTRIL) 20 MG tablet Take 20 mg by mouth daily.     [name]. lithium carbonate 300 MG capsule Take 900 mg by mouth at bedtime.     [name]. LORazepam  (ATIVAN) 2 MG tablet Take 2-4 mg by mouth See admin instructions. Take 1 tablet (2mg ) by mouth daily as scheduled and 1 tablet (2mg ) by mouth daily as needed for anxiety    . magnesium hydroxide (MILK OF MAGNESIA) 400 MG/5ML suspension Take 30 mLs by mouth at bedtime as needed for moderate constipation.     . medroxyPROGESTERone (DEPO-PROVERA) 150 MG/ML injection Inject 150 mg into the muscle every 3 (three) months.     . metFORMIN (GLUCOPHAGE)  500 MG tablet Take 500 mg by mouth 2 (two) times daily.     . mometasone (ELOCON) 0.1 % cream Apply 1 application topically daily as needed (Itching of the ears).     . Multiple Vitamin (THEREMS PO) Take 1 tablet by mouth daily.    . Nutritional Supplements (JOINT FORMULA PO) Take 1 tablet by mouth 3 (three) times daily.    . Omega-3 1000 MG CAPS Take 1,000 mg by mouth 2 (two) times daily.    . Paliperidone Palmitate ER (INVEGA TRINZA) 819 MG/2.625ML SUSY Inject 819 mg into the muscle every 3 (three) months.    . propranolol (INDERAL) 40 MG tablet Take 40 mg by mouth 2 (two) times daily.    . QUEtiapine (SEROQUEL XR) 400 MG 24 hr tablet Take 1 tablet (400 mg total) by mouth at bedtime. 60 tablet 2  . QUEtiapine (SEROQUEL) 25 MG tablet Take 1 tablet (25 mg total) by mouth 3 (three) times daily with meals. 90 tablet 2   PTA Medications: (Not in a hospital admission)   Musculoskeletal: Strength & Muscle Tone: within normal limits Gait & Station: normal Patient leans: N/A  Psychiatric Specialty Exam: Physical Exam Vitals and nursing note reviewed.  Constitutional:      Appearance: Normal appearance.  HENT:     Head: Normocephalic.     Nose: Nose normal.  Pulmonary:     Effort: Pulmonary effort is normal.  Musculoskeletal:        General: Normal range of motion.     Cervical back: Normal range of motion.  Neurological:     General: No focal deficit present.     Mental Status: She is alert and oriented to person, place, and time.  Psychiatric:         Attention and Perception: Attention and perception normal.        Mood and Affect: Mood and affect normal.        Speech: Speech normal.        Behavior: Behavior normal. Behavior is cooperative.        Thought Content: Thought content normal.        Cognition and Memory: Cognition is impaired.        Judgment: Judgment is impulsive.     Review of Systems  Psychiatric/Behavioral: Positive for behavioral problems. The patient is nervous/anxious.   All other systems reviewed and are negative.   Blood pressure (!) 135/79, pulse 85, temperature 99.4 F (37.4 C), height 5\' 3"  (1.6 m), weight (!) 116.1 kg, SpO2 99 %.Body mass index is 45.35 kg/m.  General Appearance: Casual  Eye Contact:  Fair  Speech:  Normal Rate  Volume:  Normal  Mood:  Anxious  Affect:  Blunt  Thought Process:  Coherent and Descriptions of Associations: Intact  Orientation:  Full (Time, Place, and Person)  Thought Content:  WDL and Logical  Suicidal Thoughts:  No  Homicidal Thoughts:  No  Memory:  Immediate;   Good Recent;   Good Remote;   Good  Judgement:  Fair  Insight:  Fair  Psychomotor Activity:  Normal  Concentration:  Concentration: Good and Attention Span: Good  Recall:  Good  Fund of Knowledge:  Fair  Language:  Good  Akathisia:  No  Handed:  Right  AIMS (if indicated):     Assets:  Housing Leisure Time Physical Health Resilience Social Support  ADL's:  Intact  Cognition:  Impaired,  Moderate  Sleep:        Demographic  Factors:  Adolescent or young adult and Caucasian  Loss Factors: NA  Historical Factors: Impulsivity  Risk Reduction Factors:   Sense of responsibility to family, Living with another person, especially a relative, Positive social support and Positive therapeutic relationship  Continued Clinical Symptoms:  Anxiety, mild  Cognitive Features That Contribute To Risk:  None    Suicide Risk:  Minimal: No identifiable suicidal ideation.  Patients presenting  with no risk factors but with morbid ruminations; may be classified as minimal risk based on the severity of the depressive symptoms   Follow-up Information    [facility] In 2 days.   Contact information: [address] [name] [address] [phone]               Plan Of Care/Follow-up recommendations:  Adjustment disorder with mixed disturbance of emotions and conduct: -Encouraged use of her coping skills when she gets upset or angry -Continue current medication regiment -Follow-up with outpatient therapist and psychiatrist. Activity:  As tolerated Diet:  Heart healthy  Disposition: Discharged to group home Nanine Means, NP 11/22/2019, 5:16 PM

## 2019-11-22 NOTE — ED Notes (Signed)
Pt discharged back to group home in care of group home staff.  VS stable. Pt denies SI/HI. All belongings returned to patient. Patient's legal guardian contacted by NP.  Group home staff member signed discharge papers.

## 2019-11-22 NOTE — ED Notes (Signed)
Pt in dayroom waiting for her ride.

## 2019-11-22 NOTE — ED Provider Notes (Signed)
[facility] Emergency Department Provider Note  Time seen: 12:23 PM  I have reviewed the triage vital signs and the nursing notes.   HISTORY  Chief Complaint Suicidal ideation and homicidal ideation  HPI Dawn Lambert is a 29 y.o. female with a past medical history of anxiety, diabetes, hypertension, mental retardation presents to the emergency department for suicidal homicidal ideation.  According to  the patient she has a coupon for a free snow cone, but her group home staff members would not let her use it today.  Patient states she wants to stab the staff members and go lay down in traffic to kill her self.  Patient denies any medical complaints today.  Past Medical History:  Diagnosis Date  . Anxiety   . Arnold-Chiari malformation (HCC)   . Atrial septal defect   . Complication of anesthesia    when pt was 11, woke up during surgery (muscle biopsy)  . Constipation   . Contraception management   . Dandruff   . Dermatomyositis (HCC)    age 32  . Diabetes mellitus without complication (HCC)   . Elevated blood pressure   . Hypertension   . Hypothyroidism   . Mental retardation   . Mood disorder (HCC)   . Overweight(278.02)   . Pelvic pain   . Prediabetes     Patient Active Problem List   Diagnosis Date Noted  . 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 Intellectual disability 10/12/2017  . Umbilical hernia without obstruction and without gangrene 08/15/2017  . Chest pain 11/05/2012  . Atrial septal defect 09/19/2011    Past Surgical History:  Procedure Laterality Date  . ASD REPAIR     at age 60  . UMBILICAL HERNIA REPAIR N/A 09/17/2017   Procedure: HERNIA REPAIR UMBILICAL ADULT;  Surgeon: Earline Mayotte, MD;  Location: [facility] ORS;  Service: General;  Laterality: N/A;    Prior to Admission medications   Medication Sig Start Date End Date Taking? Authorizing Provider  acetaminophen (TYLENOL) 325 MG  tablet Take 650 mg by mouth every 6 (six) hours as needed for mild pain, moderate pain or fever.    [provider]  atorvastatin (LIPITOR) 10 MG tablet Take 10 mg by mouth daily.     [provider]  carbamazepine (TEGRETOL) 200 MG tablet Take 400 mg by mouth 2 (two) times daily.    [provider]  cholecalciferol (VITAMIN D) 1000 units tablet Take 1,000 Units by mouth daily.     [provider]  clonazePAM (KLONOPIN) 1 MG tablet Take 1 mg by mouth 3 (three) times daily.     [provider]  clotrimazole (LOTRIMIN) 1 % cream Apply 1 application topically 2 (two) times daily as needed (yeast under the breasts).     [provider]  Desoximetasone (TOPICORT) 0.25 % LIQD Apply 1 application topically daily as needed (skin issue).    [provider]  Fluocinolone Acetonide Scalp (DERMA-SMOOTHE/FS SCALP) 0.01 % OIL Apply 1-5 application topically See admin instructions. Apply to scalp every week for psoriasis but may increase up to 5 days per week as needed.    [provider]  fluticasone (FLONASE) 50 MCG/ACT nasal spray Place 2 sprays into the nose daily as needed for allergies.     [provider]  gabapentin (NEURONTIN) 600 MG tablet Take 600 mg by mouth 3 (three) times daily.    [provider]  levothyroxine (SYNTHROID, LEVOTHROID)  175 MCG tablet Take 175 mcg by mouth daily before breakfast.    [provider]  lisinopril (PRINIVIL,ZESTRIL) 20 MG tablet Take 20 mg by mouth daily.     [provider]  lithium carbonate 300 MG capsule Take 900 mg by mouth at bedtime.     [provider]  LORazepam (ATIVAN) 2 MG tablet Take 2-4 mg by mouth See admin instructions. Take 1 tablet (2mg ) by mouth daily as scheduled and 1 tablet (2mg ) by mouth daily as needed for anxiety    [provider]  magnesium hydroxide (MILK OF MAGNESIA) 400 MG/5ML suspension Take 30 mLs by mouth at bedtime as  needed for moderate constipation.     [provider]  medroxyPROGESTERone (DEPO-PROVERA) 150 MG/ML injection Inject 150 mg into the muscle every 3 (three) months.     [provider]  metFORMIN (GLUCOPHAGE) 500 MG tablet Take 500 mg by mouth 2 (two) times daily.     [provider]  mometasone (ELOCON) 0.1 % cream Apply 1 application topically daily as needed (Itching of the ears).     [provider]  Multiple Vitamin (THEREMS PO) Take 1 tablet by mouth daily.    [provider]  Nutritional Supplements (JOINT FORMULA PO) Take 1 tablet by mouth 3 (three) times daily.    [provider]  Omega-3 1000 MG CAPS Take 1,000 mg by mouth 2 (two) times daily.    [provider]  Paliperidone Palmitate ER (INVEGA TRINZA) 819 MG/2.625ML SUSY Inject 819 mg into the muscle every 3 (three) months.    [provider]  propranolol (INDERAL) 40 MG tablet Take 40 mg by mouth 2 (two) times daily.    [provider]  QUEtiapine (SEROQUEL XR) 400 MG 24 hr tablet Take 1 tablet (400 mg total) by mouth at bedtime. 10/08/18   , MD  QUEtiapine (SEROQUEL) 25 MG tablet Take 1 tablet (25 mg total) by mouth 3 (three) times daily with meals. 10/08/18   Mariel Craft, MD    Allergies  Allergen Reactions  . Augmentin [Amoxicillin-Pot Clavulanate] Other (See Comments)    Has patient had a PCN reaction causing immediate rash, facial/tongue/throat swelling, SOB or lightheadedness with hypotension: ______ Has patient had a PCN reaction causing severe rash involving mucus membranes or skin necrosis: _______ Has patient had a PCN reaction that required hospitalization: _______ Has patient had a PCN reaction occurring within the last 10 years:_______ If all of the above answers are "NO", then may proceed with Cephalosporin use.     History reviewed. No pertinent family history.  Social History Social History   Tobacco Use  .  Smoking status: Never Smoker  . Smokeless tobacco: Never Used  Vaping Use  . Vaping Use: Never used  Substance Use Topics  . Alcohol use: No  . Drug use: No    Review of Systems Constitutional: Negative for fever. Cardiovascular: Negative for chest pain. Respiratory: Negative for shortness of breath. Gastrointestinal: Negative for abdominal pain, vomiting Musculoskeletal: Negative for musculoskeletal complaints Neurological: Negative for headache All other ROS negative  ____________________________________________   PHYSICAL EXAM:  VITAL SIGNS: ED Triage Vitals  Enc Vitals Group     BP 11/22/19 1154 (!) 135/79     Pulse Rate 11/22/19 1154 85     Resp --      Temp 11/22/19 1154 99.4 F (37.4 C)     Temp src --      SpO2 11/22/19 1154  99 %     Weight 11/22/19 1201 (!) 256 lb (116.1 kg)     Height 11/22/19 1201 5\' 3"  (1.6 m)     Head Circumference --      Peak Flow --      Pain Score 11/22/19 1201 8     Pain Loc --      Pain Edu? --      Excl. in GC? --     Constitutional: Awake alert, no acute distress. Eyes: Normal exam ENT      Head: Normocephalic and atraumatic.      Mouth/Throat: Mucous membranes are moist. Cardiovascular: Normal rate, regular rhythm Respiratory: Normal respiratory effort without tachypnea nor retractions. Breath sounds are clear  Gastrointestinal: Soft and nontender. No distention. Musculoskeletal: Nontender with normal range of motion in all extremities.  Neurologic:  Normal speech and language. No gross focal neurologic deficits  Skin:  Skin is warm, dry and intact.  Psychiatric: Calm cooperative.    INITIAL IMPRESSION / ASSESSMENT AND PLAN / ED COURSE  Pertinent labs & imaging results that were available during my care of the patient were reviewed by me and considered in my medical decision making (see chart for details).   Patient presents emergency department for suicidal and homicidal ideation.  Currently patient is calm  cooperative, no distress.  We will IVC the patient given her threats against herself and others.  We will have psychiatry evaluate.  Lab work pending.  Labs are largely nonrevealing.  Psychiatric disposition and evaluation pending  MASHAL SLAVICK was evaluated in Emergency Department on 11/22/2019 for the symptoms described in the history of present illness. She was evaluated in the context of the global COVID-19 pandemic, which necessitated consideration that the patient might be at risk for infection with the SARS-CoV-2 virus that causes COVID-19. Institutional protocols and algorithms that pertain to the evaluation of patients at risk for COVID-19 are in a state of rapid change based on information released by regulatory bodies including the CDC and federal and state organizations. These policies and algorithms were followed during the patient's care in the ED.  The patient has been placed in psychiatric observation due to the need to provide a safe environment for the patient while obtaining psychiatric consultation and evaluation, as well as ongoing medical and medication management to treat the patient's condition.  The patient has been placed under full IVC at this time.   ____________________________________________   FINAL CLINICAL IMPRESSION(S) / ED DIAGNOSES  Suicidal ideation Homicidal ideation   11/24/2019, MD 11/22/19 1324

## 2019-11-22 NOTE — ED Notes (Signed)
Pt provided lunch tray.

## 2019-11-22 NOTE — ED Triage Notes (Signed)
Patient was picked up by PD on [address] after found laying face down on the road. Patient reports getting mad at group home and stated she was going to hurt herself and left after they would not let her use coupons. Patient states "I feel like I want to stab all staff and go lay in the road and kill myself"

## 2019-11-22 NOTE — BH Assessment (Signed)
Assessment Note  Dawn Lambert is an 29 y.o. female. Pt presents to the ED via [facility], under IVC due to endorsing SI and HI. Patient was threatening to stab her group home staff, and was found lying on the street with a goal to get hit by a car. The pt. exhibited poor impulse control and was yelling loudly out of her room as this writer approached the pt.'s room for an assessment. Upon interview, pt. was disheveled with slow, slurred speech. The patient was hyper focused on her frustration with group home staff. The patient listed her main stressor as not being able to utilize her coupons once a week. As the interview progressed, the Pt was calm and cooperative; however, her responses were circumstantial. The pt. reported that she had a desire to hurt her staff earlier in the day, however she no longer had those thoughts. The pt. had poor eye contact and stared at the floor throughout the entirety of the interview. The pt. denied SI, HI, and AV/H.  [facility] Staff Cherre Huger [phone]): Cherre Huger reported the patient acts out when she doesn't get her way. Cherre Huger reported that the patient will go into the front yard and remove her clothes as a way to be hospitalized. Cherre Huger reported that the patient was confronted about wrongly completing customer service surveys to get coupons for free food. Cherre Huger explained that when the patient was told that she could not do that and that she could not utilize the coupons, the patient began escalating. Mack expressed that he does not feel the patient is a danger to herself.   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 Anxiety   . Arnold-Chiari malformation (HCC)   . Atrial septal defect   . Complication of anesthesia    when pt was 11, woke up during surgery (muscle biopsy)  . Constipation   . Contraception management   . Dandruff   . Dermatomyositis (HCC)    age 67  .  Diabetes mellitus without complication (HCC)   . Elevated blood pressure   . Hypertension   . Hypothyroidism   . Mental retardation   . Mood disorder (HCC)   . Overweight(278.02)   . Pelvic pain   . Prediabetes     Past Surgical History:  Procedure Laterality Date  . ASD REPAIR     at age 19  . UMBILICAL HERNIA REPAIR N/A 09/17/2017   Procedure: HERNIA REPAIR UMBILICAL ADULT;  Surgeon: Earline Mayotte, MD;  Location: [facility] ORS;  Service: General;  Laterality: N/A;    Family History: History reviewed. No pertinent family history.  Social History:  reports that she has never smoked. She has never used smokeless tobacco. She reports that she does not drink alcohol and does not use drugs.  Additional Social History:  Alcohol / Drug Use Pain Medications: See PTA. Prescriptions: See PTA  CIWA: CIWA-Ar BP: (!) 135/79 Pulse Rate: 85 COWS:    Allergies:  Allergies  Allergen Reactions  . Augmentin [Amoxicillin-Pot Clavulanate] Other (See Comments)    Has patient had a PCN reaction causing immediate rash, facial/tongue/throat swelling, SOB or lightheadedness with hypotension: ______ Has patient had a PCN reaction causing severe rash involving mucus membranes or skin necrosis: _______ Has patient had a PCN reaction that required hospitalization: _______ Has patient had a PCN reaction occurring within the last 10 years:_______ If all of the above answers are "NO", then may  proceed with Cephalosporin use.     Home Medications: (Not in a hospital admission)   OB/GYN Status:  No LMP recorded. Patient has had an injection.  General Assessment Data Location of Assessment: [facility] ED TTS Assessment: In system Is this a Tele or Face-to-Face Assessment?: Face-to-Face Is this an Initial Assessment or a Re-assessment for this encounter?: Initial Assessment Patient Accompanied by:: N/A Language Other than English: No Living Arrangements: In Group Home: (Comment: Name of Group Home) What  gender do you identify as?: Female Date Telepsych consult ordered in [facility]: 11/22/19 Time Telepsych consult ordered in [facility]: 1203 Marital status: Single Maiden name: N/A Pregnancy Status: No Living Arrangements: Group Home Can pt return to current living arrangement?: Yes Admission Status: Involuntary Petitioner: Police Is patient capable of signing voluntary admission?: No Referral Source: Other Insurance type: Medicaid [place]  Medical Screening Exam [facility] Walk-in ONLY) Medical Exam completed: Yes  Crisis Care Plan Living Arrangements: Group Home Legal Guardian: Mother  Education Status Is patient currently in school?: No Is the patient employed, unemployed or receiving disability?: Unemployed  Risk to self with the past 6 months Suicidal Ideation: No Has patient been a risk to self within the past 6 months prior to admission? : No Suicidal Intent: No Has patient had any suicidal intent within the past 6 months prior to admission? : No Is patient at risk for suicide?: No Suicidal Plan?: No Has patient had any suicidal plan within the past 6 months prior to admission? : No Access to Means: No What has been your use of drugs/alcohol within the last 12 months?: n/a Previous Attempts/Gestures: No Other Self Harm Risks: n/a Triggers for Past Attempts: None known Intentional Self Injurious Behavior:  (Lying down in the street) Family Suicide History: Unknown Recent stressful life event(s): Conflict (Comment) Persecutory voices/beliefs?: No Depression: No Depression Symptoms:  (n/a) Substance abuse history and/or treatment for substance abuse?: No Suicide prevention information given to non-admitted patients: Not applicable  Risk to Others within the past 6 months Homicidal Ideation: No Does patient have any lifetime risk of violence toward others beyond the six months prior to admission? : No Thoughts of Harm to Others: No Current Homicidal Intent: No Current Homicidal Plan:  No Access to Homicidal Means: No Identified Victim: n/a History of harm to others?: No Assessment of Violence: None Noted Violent Behavior Description: n/a Does patient have access to weapons?: No Criminal Charges Pending?: No Does patient have a court date: No Is patient on probation?: No  Psychosis Hallucinations: None noted Delusions: None noted  Mental Status Report Appearance/Hygiene: Disheveled Eye Contact: Poor Motor Activity: Unremarkable Speech: Slow, Slurred Level of Consciousness: Alert Mood: Irritable Affect: Blunted Anxiety Level: None Thought Processes: Coherent, Relevant Judgement: Impaired Orientation: Person, Place, Time, Situation Obsessive Compulsive Thoughts/Behaviors: Moderate  Cognitive Functioning Concentration: Normal Memory: Recent Intact, Remote Intact Is patient IDD: Yes Insight: Poor Impulse Control: Poor Appetite: Good Have you had any weight changes? : No Change Sleep: No Change Vegetative Symptoms: None  ADLScreening [facility] Services) Patient's cognitive ability adequate to safely complete daily activities?: Yes Patient able to express need for assistance with ADLs?: Yes Independently performs ADLs?: Yes (appropriate for developmental age)  Prior Inpatient Therapy Prior Inpatient Therapy: Yes Prior Therapy Dates: 2021 Prior Therapy Facilty/Provider(s): [facility] Reason for Treatment: Endorsing SI  Prior Outpatient Therapy Prior Outpatient Therapy: Yes Does patient have an ACCT team?: No Does patient have Intensive In-House Services?  : No Does patient have [facility] services? : No Does patient  have P4CC services?: No  ADL Screening (condition at time of admission) Patient's cognitive ability adequate to safely complete daily activities?: Yes Is the patient deaf or have difficulty hearing?: No Does the patient have difficulty seeing, even when wearing glasses/contacts?: No Does the patient have difficulty concentrating,  remembering, or making decisions?: No Patient able to express need for assistance with ADLs?: Yes Does the patient have difficulty dressing or bathing?: No Independently performs ADLs?: Yes (appropriate for developmental age) Does the patient have difficulty walking or climbing stairs?: No Weakness of Legs: None Weakness of Arms/Hands: None  Home Assistive Devices/Equipment Home Assistive Devices/Equipment: None  Therapy Consults (therapy consults require a physician order) PT Evaluation Needed: No OT Evalulation Needed: No SLP Evaluation Needed: No   Values / Beliefs Cultural Requests During Hospitalization: None Spiritual Requests During Hospitalization: None Consults Spiritual Care Consult Needed: No Transition of Care Team Consult Needed: No Advance Directives (For Healthcare) Does Patient Have a Medical Advance Directive?: No Would patient like information on creating a medical advance directive?: No - Patient declined          Disposition: Per psych NP, Catha Nottingham, L. Pt does not meet inpatient criteria and can discharge.  Disposition Initial Assessment Completed for this Encounter: Yes  On Site Evaluation by:   Reviewed with Physician:    Foy Guadalajara 11/22/2019 5:11 PM

## 2019-11-22 NOTE — ED Notes (Signed)
Patients legal guardian contacted and made aware of patient being in ER. Legal guardian is patients mother.

## 2019-11-22 NOTE — ED Notes (Signed)
First Nurse Note: Pt to ED via [facility] for IVC. Pt is calm and cooperative at this time.

## 2019-11-22 NOTE — ED Notes (Signed)
Pt called out to this RN and stated "feelings were making me want to stab the staff at the group home and kill them and then lay in the street to get run over and killed" pt stated voices were telling her to do that because of the feelings. I asked the pt if she personally thought that was a good idea and she stated "no".

## 2019-11-22 NOTE — ED Notes (Signed)
Pt states she is at the ED r/t being upset w/ staff at group home for not letting her use her coupons. Pt stated she wanted to stab the staff to kill them and then kill herself. This RN asked pt if she had a knife to use and pt stated she did.

## 2019-11-22 NOTE — Discharge Instructions (Signed)
Please seek medical attention and help for any thoughts about wanting to harm yourself, harm others, any concerning change in behavior, severe depression, inappropriate drug use or any other new or concerning symptoms.    Helping Someone Who Is Suicidal Suicide is the act of ending (taking) one's own life. Someone who is thinking about suicide needs immediate help. Even if you do not know what to say or do to help, you can start by letting the person know that you care. Listen to him or her. Then talk about how to get help. Help is available through suicide hotlines, therapy, and other treatments. What are signs that someone is suicidal? Common signs include:  Signs of depression, such as: ? Tearfulness or sadness. ? Irritability or rage. ? Problems with eating or sleeping. ? Feeling guilty or worthless. ? Loss of interest in things that a person used to enjoy. ? Feelings of hopelessness or helplessness. ? Recurrent thoughts of death or suicide.  Changes in social behaviors and relationships, including: ? Isolating oneself. ? Withdrawing from friends and family. ? Giving away possessions. ? Saying goodbye. ? Acting aggressively. ? Sleeping more or less than usual. ? Having trouble managing school or work. ? Talking about feeling hopeless or being a burden. ? Engaging in risky behaviors, such as drinking more alcohol or using more drugs. What are the risk factors for suicide? Risk factors for suicide include:  Having a friend or family member who has died by suicide.  A history of attempted suicide.  Depression or other mental health problems.  Being exposed to graphic stories of suicide in the media.  Alcohol or drug abuse, especially when combined with a mental illness.  A serious physical problem, such as chronic pain.  A stressful life event, now or in the past, such as: ? Divorce or social rejection. ? Childhood abuse or neglect. ? Sudden life changes, such as  financial crisis or going to jail. What should I do if someone is suicidal? If you think someone may be suicidal:  Ask him or her directly: "Are you thinking about suicide or hurting yourself?" Asking that question does not make someone more likely to make a suicide attempt.  Avoid giving advice or arguing with the person about the value of his or her life. If a person confides in you that he or she is considering suicide:  Take the person seriously. Never ignore comments about suicide.  Listen to the person's thoughts and concerns with compassion.  Let the person know that you will stay with him or her.  Offer to help the person get to a doctor or mental health professional.  Remove all weapons and medicines from the person's living area.  Do not promise to keep his or her thoughts of suicide a secret.  Call a suicide crisis helpline, such as the National Suicide Prevention Lifeline at 952-266-9036 or text TALK to 205-699-3768. Get help right away if: You believe that a person is in immediate danger of hurting himself or herself, or may have thoughts of taking his or her own life. You can:  Call a crisis center or a local suicide prevention center. These are often located at hospitals, clinics, [employer], social service providers, or health departments.  Call a suicide crisis helpline, such as the National Suicide Prevention Lifeline at 775-445-2645, or text TALK to (509)491-2475. This is open 24 hours a day.  Take the person to the nearest emergency department.  Call your local emergency services (  911 in the [place]  The [address] [address]'s health and human services helpline (211 in the [place] Summary  Suicide is the act of ending (taking) one's own life.  Suicide can be prevented by knowing the signs and taking action.  If you know someone who is showing risk factors for suicide, ask if he or she is thinking about hurting himself or herself. Take all concerns about  suicide seriously, and get support from experts in mental illness or suicide.  If you believe that a person is in immediate danger of hurting himself or herself, or may have thoughts of taking his or her own life, get help right away. This information is not intended to replace advice given to you by your health care provider. Make sure you discuss any questions you have with your health care provider. Document Revised: 01/08/2019 Document Reviewed: 02/16/2017 Elsevier Patient Education  2020 [employer].

## 2019-12-18 ENCOUNTER — Other Ambulatory Visit: Payer: Self-pay | Admitting: Dermatology

## 2020-01-09 ENCOUNTER — Encounter: Payer: Self-pay | Admitting: Emergency Medicine

## 2020-01-09 ENCOUNTER — Other Ambulatory Visit: Payer: Self-pay

## 2020-01-09 ENCOUNTER — Emergency Department
Admission: EM | Admit: 2020-01-09 | Discharge: 2020-01-10 | Disposition: A | Discharge status: Home / Self Care | Payer: Medicaid Other | Attending: Emergency Medicine | Admitting: Emergency Medicine

## 2020-01-09 DIAGNOSIS — Z20822 Contact with and (suspected) exposure to covid-19: Secondary | ICD-10-CM | POA: Insufficient documentation

## 2020-01-09 DIAGNOSIS — F32A Depression, unspecified: Secondary | ICD-10-CM

## 2020-01-09 DIAGNOSIS — R45851 Suicidal ideations: Secondary | ICD-10-CM | POA: Diagnosis not present

## 2020-01-09 DIAGNOSIS — F329 Major depressive disorder, single episode, unspecified: Secondary | ICD-10-CM | POA: Diagnosis present

## 2020-01-09 DIAGNOSIS — Z79899 Other long term (current) drug therapy: Secondary | ICD-10-CM | POA: Diagnosis not present

## 2020-01-09 DIAGNOSIS — I10 Essential (primary) hypertension: Secondary | ICD-10-CM | POA: Diagnosis not present

## 2020-01-09 DIAGNOSIS — Z7984 Long term (current) use of oral hypoglycemic drugs: Secondary | ICD-10-CM | POA: Diagnosis not present

## 2020-01-09 DIAGNOSIS — E119 Type 2 diabetes mellitus without complications: Secondary | ICD-10-CM | POA: Insufficient documentation

## 2020-01-09 DIAGNOSIS — E039 Hypothyroidism, unspecified: Secondary | ICD-10-CM | POA: Insufficient documentation

## 2020-01-09 DIAGNOSIS — F4325 Adjustment disorder with mixed disturbance of emotions and conduct: Secondary | ICD-10-CM | POA: Diagnosis present

## 2020-01-09 DIAGNOSIS — F79 Unspecified intellectual disabilities: Secondary | ICD-10-CM

## 2020-01-09 LAB — CBC
HCT: 34.4 % — ABNORMAL LOW (ref 36.0–46.0)
Hemoglobin: 11.2 g/dL — ABNORMAL LOW (ref 12.0–15.0)
MCH: 28.8 pg (ref 26.0–34.0)
MCHC: 32.6 g/dL (ref 30.0–36.0)
MCV: 88.4 fL (ref 80.0–100.0)
Platelets: 300 10*3/uL (ref 150–400)
RBC: 3.89 MIL/uL (ref 3.87–5.11)
RDW: 13.2 % (ref 11.5–15.5)
WBC: 12.1 10*3/uL — ABNORMAL HIGH (ref 4.0–10.5)
nRBC: 0 % (ref 0.0–0.2)

## 2020-01-09 LAB — COMPREHENSIVE METABOLIC PANEL
ALT: 30 U/L (ref 0–44)
AST: 19 U/L (ref 15–41)
Albumin: 4.2 g/dL (ref 3.5–5.0)
Alkaline Phosphatase: 75 U/L (ref 38–126)
Anion gap: 10 (ref 5–15)
BUN: 9 mg/dL (ref 6–20)
CO2: 26 mmol/L (ref 22–32)
Calcium: 9.4 mg/dL (ref 8.9–10.3)
Chloride: 102 mmol/L (ref 98–111)
Creatinine, Ser: 0.7 mg/dL (ref 0.44–1.00)
GFR calc Af Amer: >60 mL/min (ref >60)
GFR calc non Af Amer: >60 mL/min (ref >60)
Glucose, Bld: 129 mg/dL — ABNORMAL HIGH (ref 70–99)
Potassium: 3.8 mmol/L (ref 3.5–5.1)
Sodium: 138 mmol/L (ref 135–145)
Total Bilirubin: 0.6 mg/dL (ref 0.3–1.2)
Total Protein: 7.9 g/dL (ref 6.5–8.1)

## 2020-01-09 LAB — SALICYLATE LEVEL: Salicylate Lvl: <7 mg/dL — ABNORMAL LOW (ref 7.0–30.0)

## 2020-01-09 LAB — ETHANOL: Alcohol, Ethyl (B): <10 mg/dL (ref <10)

## 2020-01-09 LAB — ACETAMINOPHEN LEVEL: Acetaminophen (Tylenol), Serum: <10 ug/mL — ABNORMAL LOW (ref 10–30)

## 2020-01-09 NOTE — BH Assessment (Signed)
9:48 PM Attempted to reach [facility] [phone] about pt's plan of care however there was no answer.

## 2020-01-09 NOTE — ED Triage Notes (Signed)
Pt arrives via [facility] with IVC paperwork. Pt stated to staff that she was going to "lay down naked in the road". Pt states HI towards other patient who was eating a sweet in front of her when the patient had been told "no more snacks for the night".

## 2020-01-09 NOTE — Consult Note (Signed)
[facility] Face-to-Face Psychiatry Consult   Reason for Consult:  Psych evaluation Referring Physician:  Dr. Derrill Kay Patient Identification: Dawn Lambert MRN:  [id number] Principal Diagnosis: Adjustment disorder with mixed disturbance of emotions and conduct Diagnosis:  Principal Problem:   Adjustment disorder with mixed disturbance of emotions and conduct Active Problems:   Intellectual disability   Total Time spent with patient: 45 minutes  Subjective:   Dawn Lambert is a 29 y.o. female Pt arrives via BPD with IVC paperwork. Pt stated to staff that she was going to "lay down naked in the road". Pt states HI towards other patient who was eating a sweet in front of her when the patient had been told "no more snacks for the night".  HPI:  Tele Assessment  Dawn Lambert, 29 y.o., female patient presented to [facility] via IVC.  Patient seen face to face byTTS and this provider; chart reviewed and consulted with Dr. Lucianne Muss on 01/10/20.  On evaluation Dawn Lambert reports per TTS: Dawn Lambert is an 29 y.o. female. Pt presented to the ED involuntarily via BPD due to pt. laying in the road naked. Pt was sitting calmly in bed upon this writer's arrival. The patient had an agitated mood and affect. When asked what had brought her into the ED the patient stated, "I laid in the road today." Pt had an unremarkable appearance and was calm and cooperative. The patient had slurred speech and her thought content was relevant to the situation. The patient had limited insight and was fixated on not returning to her group home.  The patient endorsed command hallucinations and referenced group home staff and residents as her main stressor/trigger. Pt continues to endorse passive SI with a plan to lay in the road. Pt explained that she sometimes hears voices telling her to harm group home staff and residents. The patient reported she'd attended [facility] earlier in the day and urinated in the floor. When asked why the  patient stated, "I needed more help than they could give me." The patient continued to endorse SI stating, "If I keep living at that group home I may kill myself." When asked what she would like to see happen in her life the patient stated, "Not to go back to my group home."  Collateral contact Ailen Strauch (Mother/Legal Guardian) [phone] Mother reported that the new staff don't know the patient very well. Mother mentioned that the patient didn't get her PRN once pt was triggered. If pt doesn't get PRN she decompensates. Mother wants the patient to return to her group home as she thinks that they are good for the patient overall.   Disposition: Overnight observation   Past Psychiatric History:  Adjustment disorder  Risk to Self: Suicidal Ideation: Yes-Currently Present Suicidal Intent: Yes-Currently Present Is patient at risk for suicide?: No Suicidal Plan?: Yes-Currently Present (Pt endorses a desire to lay in the street) Specify Current Suicidal Plan: Lay in the middle of the road to get hit by car Access to Means: No What has been your use of drugs/alcohol within the last 12 months?: N/a How many times?: 1 Other Self Harm Risks: None noted Triggers for Past Attempts: Other personal contacts (Staff/Not getting her way) Intentional Self Injurious Behavior: None Risk to Others: Homicidal Ideation: Yes-Currently Present Thoughts of Harm to Others: Yes-Currently Present Comment - Thoughts of Harm to Others: Group home staff and other residents Current Homicidal Intent: No Current Homicidal Plan: No Access to Homicidal Means: No Identified Victim: Haematologist  and residents at her group home History of harm to others?: No Assessment of Violence: None Noted Violent Behavior Description: None noted Does patient have access to weapons?: No Criminal Charges Pending?: No Does patient have a court date: No Prior Inpatient Therapy: Prior Inpatient Therapy: Yes Prior Therapy Dates: 2021 [facility]  [facility] Reason for Treatment: Endorsing SI Prior Outpatient Therapy: Prior Outpatient Therapy: Yes Prior Therapy Dates: 01-09-20 Prior Therapy Facilty/Provider(s): [facility] Reason for Treatment: Psychiatry Does patient have an ACCT team?: No Does patient have Intensive In-House Services?  : No Does patient have [facility] services? : No Does patient have P4CC services?: No  Past Medical History:  Past Medical History:  Diagnosis Date  . Anxiety   . Arnold-Chiari malformation (HCC)   . Atrial septal defect   . Complication of anesthesia    when pt was 11, woke up during surgery (muscle biopsy)  . Constipation   . Contraception management   . Dandruff   . Dermatomyositis (HCC)    age 89  . Diabetes mellitus without complication (HCC)   . Elevated blood pressure   . Hypertension   . Hypothyroidism   . Mental retardation   . Mood disorder (HCC)   . Overweight(278.02)   . Pelvic pain   . Prediabetes     Past Surgical History:  Procedure Laterality Date  . ASD REPAIR     at age 89  . UMBILICAL HERNIA REPAIR N/A 09/17/2017   Procedure: HERNIA REPAIR UMBILICAL ADULT;  Surgeon: [name], [name], MD;  Location: [facility] ORS;  Service: General;  Laterality: N/A;   Family History: No family history on file. Family Psychiatric  History: unknown Social History:  Social History   Substance and Sexual Activity  Alcohol Use No     Social History   Substance and Sexual Activity  Drug Use No    Social History   Socioeconomic History  . Marital status: Single    Spouse name: Not on file  . Number of children: Not on file  . Years of education: Not on file  . Highest education level: Not on file  Occupational History  . Not on file  Tobacco Use  . Smoking status: Never Smoker  . Smokeless tobacco: Never Used  Vaping Use  . Vaping Use: Never used  Substance and Sexual Activity  . Alcohol use: No  . Drug use: No  . Sexual activity: Not on file  Other  Topics Concern  . Not on file  Social History Narrative  . Not on file   Social Determinants of Health   Financial Resource Strain:   . Difficulty of Paying Living Expenses: Not on file  Food Insecurity:   . Worried About Programme researcher, broadcasting/film/videounning Out of Food in the Last Year: Not on file  . Ran Out of Food in the Last Year: Not on file  Transportation Needs:   . Lack of Transportation (Medical): Not on file  . Lack of Transportation (Non-Medical): Not on file  Physical Activity:   . Days of Exercise per Week: Not on file  . Minutes of Exercise per Session: Not on file  Stress:   . Feeling of Stress : Not on file  Social Connections:   . Frequency of Communication with Friends and Family: Not on file  . Frequency of Social Gatherings with Friends and Family: Not on file  . Attends Religious Services: Not on file  . Active Member of Clubs or Organizations: Not on file  . Attends  Club or Organization Meetings: Not on file  . Marital Status: Not on file   Additional Social History:    Allergies:   Allergies  Allergen Reactions  . Augmentin [Amoxicillin-Pot Clavulanate] Other (See Comments)    Has patient had a PCN reaction causing immediate rash, facial/tongue/throat swelling, SOB or lightheadedness with hypotension: ______ Has patient had a PCN reaction causing severe rash involving mucus membranes or skin necrosis: _______ Has patient had a PCN reaction that required hospitalization: _______ Has patient had a PCN reaction occurring within the last 10 years:_______ If all of the above answers are "NO", then may proceed with Cephalosporin use.     Labs:  Results for orders placed or performed during the hospital encounter of 01/09/20 (from the past 48 hour(s))  Comprehensive metabolic panel     Status: Abnormal   Collection Time: 01/09/20  7:48 PM  Result Value Ref Range   Sodium 138 135 - 145 mmol/L   Potassium 3.8 3.5 - 5.1 mmol/L   Chloride 102 98 - 111 mmol/L   CO2 26 22 - 32 mmol/L    Glucose, Bld 129 (H) 70 - 99 mg/dL    Comment: Glucose reference range applies only to samples taken after fasting for at least 8 hours.   BUN 9 6 - 20 mg/dL   Creatinine, Ser 1.61 0.44 - 1.00 mg/dL   Calcium 9.4 8.9 - 09.6 mg/dL   Total Protein 7.9 6.5 - 8.1 g/dL   Albumin 4.2 3.5 - 5.0 g/dL   AST 19 15 - 41 U/L   ALT 30 0 - 44 U/L   Alkaline Phosphatase 75 38 - 126 U/L   Total Bilirubin 0.6 0.3 - 1.2 mg/dL   GFR calc non Af Amer >60 >60 mL/min   GFR calc Af Amer >60 >60 mL/min   Anion gap 10 5 - 15    Comment: Performed at [facility], [address], [place], [name] [address]  Ethanol     Status: None   Collection Time: 01/09/20  7:48 PM  Result Value Ref Range   Alcohol, Ethyl (B) <10 <10 mg/dL    Comment: (NOTE) Lowest detectable limit for serum alcohol is 10 mg/dL.  For medical purposes only. Performed at [facility], [address] [place], [name] [address]   Salicylate level     Status: Abnormal   Collection Time: 01/09/20  7:48 PM  Result Value Ref Range   Salicylate Lvl <7.0 (L) 7.0 - 30.0 mg/dL    Comment: Performed at [facility], [address] [place], [name] [address]  Acetaminophen level     Status: Abnormal   Collection Time: 01/09/20  7:48 PM  Result Value Ref Range   Acetaminophen (Tylenol), Serum <10 (L) 10 - 30 ug/mL    Comment: (NOTE) Therapeutic concentrations vary significantly. A range of 10-30 ug/mL  may be an effective concentration for many patients. However, some  are best treated at concentrations outside of this range. Acetaminophen concentrations >150 ug/mL at 4 hours after ingestion  and >50 ug/mL at 12 hours after ingestion are often associated with  toxic reactions.  Performed at [facility], [address] [place], [name] [address]   cbc     Status: Abnormal   Collection Time: 01/09/20  7:48 PM  Result Value Ref Range   WBC 12.1 (H) 4.0 - 10.5 K/uL   RBC 3.89 3.87 - 5.11 MIL/uL    Hemoglobin 11.2 (L) 12.0 - 15.0 g/dL  HCT 34.4 (L) 36 - 46 %   MCV 88.4 80.0 - 100.0 fL   MCH 28.8 26.0 - 34.0 pg   MCHC 32.6 30.0 - 36.0 g/dL   RDW 15.5 20.8 - 02.2 %   Platelets 300 150 - 400 K/uL   nRBC 0.0 0.0 - 0.2 %    Comment: Performed at [facility], [address] [place], [name] [address]  SARS Coronavirus 2 by RT PCR (hospital order, performed in [facility] hospital lab) Nasopharyngeal Nasopharyngeal Swab     Status: None   Collection Time: 01/09/20 11:28 PM   Specimen: Nasopharyngeal Swab  Result Value Ref Range   SARS Coronavirus 2 NEGATIVE NEGATIVE    Comment: (NOTE) SARS-CoV-2 target nucleic acids are NOT DETECTED.  The SARS-CoV-2 RNA is generally detectable in upper and lower respiratory specimens during the acute phase of infection. The lowest concentration of SARS-CoV-2 viral copies this assay can detect is 250 copies / mL. A negative result does not preclude SARS-CoV-2 infection and should not be used as the sole basis for treatment or other patient management decisions.  A negative result may occur with improper specimen collection / handling, submission of specimen other than nasopharyngeal swab, presence of viral mutation(s) within the areas targeted by this assay, and inadequate number of viral copies (<250 copies / mL). A negative result must be combined with clinical observations, patient history, and epidemiological information.  Fact Sheet for Patients:   BoilerBrush.com.cy  Fact Sheet for Healthcare Providers: https://pope.com/  This test is not yet approved or  cleared by the [place] FDA and has been authorized for detection and/or diagnosis of SARS-CoV-2 by FDA under an Emergency Use Authorization (EUA).  This EUA will remain in effect (meaning this test can be used) for the duration of the COVID-19 declaration under Section 564(b)(1) of the Act, 21 U.S.C. section  360bbb-3(b)(1), unless the authorization is terminated or revoked sooner.  Performed at [facility], [address] [place], [name] [address]     No current facility-administered medications for this encounter.   Current Outpatient Medications  Medication Sig Dispense Refill  . acetaminophen (TYLENOL) 325 MG tablet Take 650 mg by mouth every 6 (six) hours as needed for mild pain, moderate pain or fever.    [name] atorvastatin (LIPITOR) 10 MG tablet Take 10 mg by mouth daily.     . carbamazepine (TEGRETOL) 200 MG tablet Take 400 mg by mouth 2 (two) times daily.    . cholecalciferol (VITAMIN D) 1000 units tablet Take 1,000 Units by mouth daily.     . clonazePAM (KLONOPIN) 1 MG tablet Take 1 mg by mouth 3 (three) times daily.     . clotrimazole (LOTRIMIN) 1 % cream Apply 1 application topically 2 (two) times daily as needed (yeast under the breasts).     . Desoximetasone (TOPICORT) 0.25 % LIQD Apply 1 application topically daily as needed (skin issue).    . Fluocinolone Acetonide Scalp (DERMA-SMOOTHE/FS SCALP) 0.01 % OIL Apply 1-5 application topically See admin instructions. Apply to scalp every week for psoriasis but may increase up to 5 days per week as needed.    . fluticasone (FLONASE) 50 MCG/ACT nasal spray Place 2 sprays into the nose daily as needed for allergies.     [name] gabapentin (NEURONTIN) 600 MG tablet Take 600 mg by mouth 3 (three) times daily.    [name] levothyroxine (SYNTHROID, LEVOTHROID) 175 MCG tablet Take 175 mcg by mouth daily before breakfast.    .  lisinopril (PRINIVIL,ZESTRIL) 20 MG tablet Take 20 mg by mouth daily.     [name] lithium carbonate 300 MG capsule Take 900 mg by mouth at bedtime.     [name] LORazepam (ATIVAN) 2 MG tablet Take 2-4 mg by mouth See admin instructions. Take 1 tablet (2mg ) by mouth daily as scheduled and 1 tablet (2mg ) by mouth daily as needed for anxiety    . magnesium hydroxide (MILK OF MAGNESIA) 400 MG/5ML suspension Take 30 mLs by mouth at bedtime as  needed for moderate constipation.     . medroxyPROGESTERone (DEPO-PROVERA) 150 MG/ML injection Inject 150 mg into the muscle every 3 (three) months.     . metFORMIN (GLUCOPHAGE) 500 MG tablet Take 500 mg by mouth 2 (two) times daily.     . mometasone (ELOCON) 0.1 % cream Apply 1 application topically daily as needed (Itching of the ears).     . Multiple Vitamin (THEREMS PO) Take 1 tablet by mouth daily.    . Nutritional Supplements (JOINT FORMULA PO) Take 1 tablet by mouth 3 (three) times daily.    . Omega-3 1000 MG CAPS Take 1,000 mg by mouth 2 (two) times daily.    . Paliperidone Palmitate ER (INVEGA TRINZA) 819 MG/2.625ML SUSY Inject 819 mg into the muscle every 3 (three) months.    . propranolol (INDERAL) 40 MG tablet Take 40 mg by mouth 2 (two) times daily.    . QUEtiapine (SEROQUEL XR) 400 MG 24 hr tablet Take 1 tablet (400 mg total) by mouth at bedtime. 60 tablet 2  . QUEtiapine (SEROQUEL) 25 MG tablet Take 1 tablet (25 mg total) by mouth 3 (three) times daily with meals. 90 tablet 2    Musculoskeletal: Strength & Muscle Tone: within normal limits Gait & Station: normal Patient leans: N/A  Psychiatric Specialty Exam: Physical Exam  Review of Systems  Blood pressure 128/75, pulse 70, temperature 98 F (36.7 C), temperature source Oral, resp. rate 17, height 5\' 2"  (1.575 m), weight 122.9 kg, SpO2 99 %.Body mass index is 49.57 kg/m.  General Appearance: Bizarre and Disheveled  Eye Contact:  Fair  Speech:  Clear and Coherent  Volume:  Normal  Mood:  Anxious and Irritable  Affect:  Congruent  Thought Process:  Coherent and Descriptions of Associations: Intact  Orientation:  Full (Time, Place, and Person)  Thought Content:  Illogical  Suicidal Thoughts:  Yes.  with intent/plan  Homicidal Thoughts:  No  Memory:  Immediate;   Fair  Judgement:  Impaired  Insight:  Lacking  Psychomotor Activity:  Normal  Concentration:  Concentration: Fair  Recall:  of Knowledge:   Fair  Language:  Fair  Akathisia:  Negative  Handed:  Right  AIMS (if indicated):     Assets:  Communication Skills Desire for Improvement Social Support  ADL's:  Intact  Cognition:  Impaired,  Mild  Sleep:        Treatment Plan Summary: overnight observation   Disposition: No evidence of imminent risk to self or others at present.   Patient does not meet criteria for psychiatric inpatient admission. observe overnight as patients behavior appears to be behavioral in nature  , NP 01/10/2020 2:03 AM

## 2020-01-09 NOTE — BH Assessment (Addendum)
Assessment Note  Dawn Lambert is an 29 y.o. female. Pt presented to the ED involuntarily via BPD due to pt. laying in the road naked. Pt was sitting calmly in bed upon this writer's arrival. The patient had an agitated mood and affect. When asked what had brought her into the ED the patient stated, "I laid in the road today." Pt had an unremarkable appearance and was calm and cooperative. The patient had slurred speech and her thought content was relevant to the situation. The patient had limited insight and was fixated on not returning to her group home.  The patient endorsed command hallucinations and referenced group home staff and residents as her main stressor/trigger. Pt continues to endorse passive SI with a plan to lay in the road. Pt explained that she sometimes hears voices telling her to harm group home staff and residents. The patient reported she'd attended [facility] earlier in the day and urinated in the floor. When asked why the patient stated, "I needed more help than they could give me." The patient continued to endorse SI stating, "If I keep living at that group home I may kill myself." When asked what she would like to see happen in her life the patient stated, "Not to go back to my group home."  Collateral contact Carlethia Mesquita (Mother/Legal Guardian) [phone] Mother reported that the new staff don't know the patient very well. Mother mentioned that the patient didn't get her PRN once pt was triggered. If pt doesn't get PRN she decompensates. Mother wants the patient to return to her group home as she thinks that they are good for the patient overall.   Diagnosis: Adjustment disorder with mixed disturbance of emotions and conduct   Past Medical History:  Past Medical History:  Diagnosis Date  . Anxiety   . Arnold-Chiari malformation (HCC)   . Atrial septal defect   . Complication of anesthesia    when pt was 11, woke up during surgery (muscle biopsy)  . Constipation   .  Contraception management   . Dandruff   . Dermatomyositis (HCC)    age 24  . Diabetes mellitus without complication (HCC)   . Elevated blood pressure   . Hypertension   . Hypothyroidism   . Mental retardation   . Mood disorder (HCC)   . Overweight(278.02)   . Pelvic pain   . Prediabetes     Past Surgical History:  Procedure Laterality Date  . ASD REPAIR     at age 17  . UMBILICAL HERNIA REPAIR N/A 09/17/2017   Procedure: HERNIA REPAIR UMBILICAL ADULT;  Surgeon: Earline Mayotte, MD;  Location: [facility] ORS;  Service: General;  Laterality: N/A;    Family History: No family history on file.  Social History:  reports that she has never smoked. She has never used smokeless tobacco. She reports that she does not drink alcohol and does not use drugs.  Additional Social History:  Alcohol / Drug Use Pain Medications: See PTA Prescriptions: See PTA History of alcohol / drug use?: No history of alcohol / drug abuse  CIWA: CIWA-Ar BP: (!) 135/105 Pulse Rate: 77 COWS:    Allergies:  Allergies  Allergen Reactions  . Augmentin [Amoxicillin-Pot Clavulanate] Other (See Comments)    Has patient had a PCN reaction causing immediate rash, facial/tongue/throat swelling, SOB or lightheadedness with hypotension: ______ Has patient had a PCN reaction causing severe rash involving mucus membranes or skin necrosis: _______ Has patient had a PCN reaction that required hospitalization:  _______ Has patient had a PCN reaction occurring within the last 10 years:_______ If all of the above answers are "NO", then may proceed with Cephalosporin use.     Home Medications: (Not in a hospital admission)   OB/GYN Status:  No LMP recorded. Patient has had an injection.  General Assessment Data Location of Assessment: [facility] ED TTS Assessment: In system Is this a Tele or Face-to-Face Assessment?: Face-to-Face Is this an Initial Assessment or a Re-assessment for this encounter?: Initial  Assessment Patient Accompanied by:: N/A Language Other than English: No Living Arrangements: In Group Home: (Comment: Name of Group Home) What gender do you identify as?: Female Date Telepsych consult ordered in [facility]: 01/09/20 Marital status: Single Maiden name: N/A Pregnancy Status: No Living Arrangements: Group Home Can pt return to current living arrangement?: Yes Admission Status: Involuntary Petitioner: Police Is patient capable of signing voluntary admission?: Yes Referral Source: Other Surveyor, quantity) Insurance type: Medicaid [place]  Medical Screening Exam [facility] Walk-in ONLY) Medical Exam completed: Yes  Crisis Care Plan Living Arrangements: Group Home Legal Guardian: Mother Name of Psychiatrist: [facility] Name of Therapist: [facility]  Education Status Is patient currently in school?: No Is the patient employed, unemployed or receiving disability?: Unemployed  Risk to self with the past 6 months Suicidal Ideation: Yes-Currently Present Has patient been a risk to self within the past 6 months prior to admission? : Yes (Pt will go in the road when triggered) Suicidal Intent: Yes-Currently Present Has patient had any suicidal intent within the past 6 months prior to admission? : Yes Is patient at risk for suicide?: No Suicidal Plan?: Yes-Currently Present (Pt endorses a desire to lay in the street) Has patient had any suicidal plan within the past 6 months prior to admission? : Yes Specify Current Suicidal Plan: Lay in the middle of the road to get hit by car Access to Means: No What has been your use of drugs/alcohol within the last 12 months?: N/a Previous Attempts/Gestures: Yes How many times?: 1 Other Self Harm Risks: None noted Triggers for Past Attempts: Other personal contacts (Staff/Not getting her way) Intentional Self Injurious Behavior: None Family Suicide History: Unknown Recent stressful life event(s): Conflict (Comment) Persecutory voices/beliefs?: Yes Depression:  Yes Depression Symptoms: Feeling angry/irritable Substance abuse history and/or treatment for substance abuse?: No Suicide prevention information given to non-admitted patients: Not applicable  Risk to Others within the past 6 months Homicidal Ideation: Yes-Currently Present Does patient have any lifetime risk of violence toward others beyond the six months prior to admission? : No Thoughts of Harm to Others: Yes-Currently Present Comment - Thoughts of Harm to Others: Group home staff and other residents Current Homicidal Intent: No Current Homicidal Plan: No Access to Homicidal Means: No Identified Victim: Staff and residents at her group home History of harm to others?: No Assessment of Violence: None Noted Violent Behavior Description: None noted Does patient have access to weapons?: No Criminal Charges Pending?: No Does patient have a court date: No Is patient on probation?: No  Psychosis Hallucinations: Auditory, With command Delusions: None noted  Mental Status Report Appearance/Hygiene: Disheveled Eye Contact: Good Motor Activity: Freedom of movement Speech: Slow, Slurred Level of Consciousness: Alert Mood: Irritable Affect: Irritable Anxiety Level: Minimal Thought Processes: Relevant, Coherent Judgement: Impaired Orientation: Person, Place, Time, Situation Obsessive Compulsive Thoughts/Behaviors: None  Cognitive Functioning Concentration: Normal Memory: Recent Intact, Remote Intact Is patient IDD: Yes Level of Function: Low Is IQ score available?: No Insight: Poor Impulse Control: Poor Appetite: Good Have you  had any weight changes? : No Change Sleep: No Change Vegetative Symptoms: None  ADLScreening [facility]) Patient's cognitive ability adequate to safely complete daily activities?: Yes Independently performs ADLs?: Yes (appropriate for developmental age)  Prior Inpatient Therapy Prior Inpatient Therapy: Yes Prior Therapy Dates:  2021 Prior Therapy Facilty/Provider(s): [facility] Reason for Treatment: Endorsing SI  Prior Outpatient Therapy Prior Outpatient Therapy: Yes Prior Therapy Dates: 01-09-20 Prior Therapy Facilty/Provider(s): [facility] Reason for Treatment: Psychiatry Does patient have an ACCT team?: No Does patient have Intensive In-House Services?  : No Does patient have [facility] services? : No Does patient have P4CC services?: No  ADL Screening (condition at time of admission) Patient's cognitive ability adequate to safely complete daily activities?: Yes Is the patient deaf or have difficulty hearing?: No Does the patient have difficulty seeing, even when wearing glasses/contacts?: No Does the patient have difficulty concentrating, remembering, or making decisions?: No Does the patient have difficulty dressing or bathing?: No Independently performs ADLs?: Yes (appropriate for developmental age) Does the patient have difficulty walking or climbing stairs?: No Weakness of Legs: None Weakness of Arms/Hands: None  Home Assistive Devices/Equipment Home Assistive Devices/Equipment: None  Therapy Consults (therapy consults require a physician order) PT Evaluation Needed: No OT Evalulation Needed: No SLP Evaluation Needed: No   Values / Beliefs Cultural Requests During Hospitalization: None Spiritual Requests During Hospitalization: None Consults Spiritual Care Consult Needed: No Transition of Care Team Consult Needed: No Advance Directives (For Healthcare) Does Patient Have a Medical Advance Directive?: No Would patient like information on creating a medical advance directive?: No - Patient declined          Disposition: Per psych NP Rashaun, D., pt to be observed overnight and reassessed in the AM.  Disposition Initial Assessment Completed for this Encounter: Yes  On Site Evaluation by:   Reviewed with Physician:    Foy Guadalajara 01/09/2020 9:29 PM

## 2020-01-09 NOTE — ED Provider Notes (Signed)
[facility] Emergency Department Provider Note   ____________________________________________   I have reviewed the triage vital signs and the nursing notes.   HISTORY  Chief Complaint Suidical ideation  History limited by: Not Limited   HPI Dawn Lambert is a 29 y.o. female who presents to the emergency department today under IVC because of concerns for both suicidal ideation as well as some threatening behavior.  Patient himself is primarily complaining of suicidal ideation.  She states that she has had this in the past and has history of depression.  She states she is on medication has not missed any doses.  Today it sounds like she got in an argument with one of her housemates.  She states because of this she got very upset.  Her plan was to lie down in front of traffic.  Patient denies any ingestions.  She denies any medical complaints.  Records reviewed. Per medical record review patient has a history of adjustment disorder, SI.  Past Medical History:  Diagnosis Date  . Anxiety   . Arnold-Chiari malformation (HCC)   . Atrial septal defect   . Complication of anesthesia    when pt was 11, woke up during surgery (muscle biopsy)  . Constipation   . Contraception management   . Dandruff   . Dermatomyositis (HCC)    age 24  . Diabetes mellitus without complication (HCC)   . Elevated blood pressure   . Hypertension   . Hypothyroidism   . Mental retardation   . Mood disorder (HCC)   . Overweight(278.02)   . Pelvic pain   . Prediabetes     Patient Active Problem List   Diagnosis Date Noted  . 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 Intellectual disability 10/12/2017  . Umbilical hernia without obstruction and without gangrene 08/15/2017  . Chest pain 11/05/2012  . Atrial septal defect 09/19/2011    Past Surgical History:  Procedure Laterality Date  . ASD REPAIR     at age 67  . UMBILICAL HERNIA  REPAIR N/A 09/17/2017   Procedure: HERNIA REPAIR UMBILICAL ADULT;  Surgeon: Earline Mayotte, MD;  Location: [facility] ORS;  Service: General;  Laterality: N/A;    Prior to Admission medications   Medication Sig Start Date End Date Taking? Authorizing Provider  acetaminophen (TYLENOL) 325 MG tablet Take 650 mg by mouth every 6 (six) hours as needed for mild pain, moderate pain or fever.    [provider]  atorvastatin (LIPITOR) 10 MG tablet Take 10 mg by mouth daily.     [provider]  carbamazepine (TEGRETOL) 200 MG tablet Take 400 mg by mouth 2 (two) times daily.    [provider]  cholecalciferol (VITAMIN D) 1000 units tablet Take 1,000 Units by mouth daily.     [provider]  clonazePAM (KLONOPIN) 1 MG tablet Take 1 mg by mouth 3 (three) times daily.     [provider]  clotrimazole (LOTRIMIN) 1 % cream Apply 1 application topically 2 (two) times daily as needed (yeast under the breasts).     [provider]  Desoximetasone (TOPICORT) 0.25 % LIQD Apply 1 application topically daily as needed (skin issue).    [provider]  Fluocinolone Acetonide Scalp (DERMA-SMOOTHE/FS SCALP) 0.01 % OIL Apply 1-5 application topically See admin instructions. Apply to scalp every week for psoriasis but may increase up to 5 days per week as needed.    [provider]  fluticasone (FLONASE) 50 MCG/ACT nasal spray Place 2 sprays into the nose daily as needed for allergies.     [provider]  gabapentin (NEURONTIN) 600 MG tablet Take 600 mg by mouth 3 (three) times daily.    [provider]  levothyroxine (SYNTHROID, LEVOTHROID) 175 MCG tablet Take 175 mcg by mouth daily before breakfast.    [provider]  lisinopril (PRINIVIL,ZESTRIL) 20 MG tablet Take 20 mg by mouth daily.     [provider]  lithium carbonate 300 MG capsule Take 900 mg by mouth at bedtime.     [provider]  LORazepam  (ATIVAN) 2 MG tablet Take 2-4 mg by mouth See admin instructions. Take 1 tablet (2mg ) by mouth daily as scheduled and 1 tablet (2mg ) by mouth daily as needed for anxiety    [provider]  magnesium hydroxide (MILK OF MAGNESIA) 400 MG/5ML suspension Take 30 mLs by mouth at bedtime as needed for moderate constipation.     [provider]  medroxyPROGESTERone (DEPO-PROVERA) 150 MG/ML injection Inject 150 mg into the muscle every 3 (three) months.     [provider]  metFORMIN (GLUCOPHAGE) 500 MG tablet Take 500 mg by mouth 2 (two) times daily.     [provider]  mometasone (ELOCON) 0.1 % cream Apply 1 application topically daily as needed (Itching of the ears).     [provider]  Multiple Vitamin (THEREMS PO) Take 1 tablet by mouth daily.    [provider]  Nutritional Supplements (JOINT FORMULA PO) Take 1 tablet by mouth 3 (three) times daily.    [provider]  Omega-3 1000 MG CAPS Take 1,000 mg by mouth 2 (two) times daily.    [provider]  Paliperidone Palmitate ER (INVEGA TRINZA) 819 MG/2.625ML SUSY Inject 819 mg into the muscle every 3 (three) months.    [provider]  propranolol (INDERAL) 40 MG tablet Take 40 mg by mouth 2 (two) times daily.    [provider]  QUEtiapine (SEROQUEL XR) 400 MG 24 hr tablet Take 1 tablet (400 mg total) by mouth at bedtime. 10/08/18   , MD  QUEtiapine (SEROQUEL) 25 MG tablet Take 1 tablet (25 mg total) by mouth 3 (three) times daily with meals. 10/08/18   Mariel Craft, MD    Allergies Augmentin [amoxicillin-pot clavulanate]  No family history on file.  Social History Social History   Tobacco Use  . Smoking status: Never Smoker  . Smokeless tobacco: Never Used  Vaping Use  . Vaping Use: Never used  Substance Use Topics  . Alcohol use: No  . Drug use: No    Review of Systems Constitutional: No fever/chills Eyes: No visual  changes. ENT: No sore throat. Cardiovascular: Denies chest pain. Respiratory: Denies shortness of breath. Gastrointestinal: No abdominal pain.  No nausea, no vomiting.  No diarrhea.   Genitourinary: Negative for dysuria. Musculoskeletal: Negative for back pain. Skin: Negative for rash. Neurological: Negative for headaches, focal weakness or numbness.  ____________________________________________   PHYSICAL EXAM:  VITAL SIGNS: ED Triage Vitals [01/09/20 1945]  Enc Vitals Group     BP (!) 135/105     Pulse Rate 77     Resp 18     Temp 97.9 F (36.6 C)     Temp Source Oral     SpO2 99 %     Weight 271 lb (122.9 kg)     Height 5\' 2"  (1.575 m)  Head Circumference      Peak Flow      Pain Score 0    Constitutional: Alert and oriented.  Eyes: Conjunctivae are normal.  ENT      Head: Normocephalic and atraumatic.      Nose: No congestion/rhinnorhea.      Mouth/Throat: Mucous membranes are moist.      Neck: No stridor. Hematological/Lymphatic/Immunilogical: No cervical lymphadenopathy. Cardiovascular: Normal rate, regular rhythm.  No murmurs, rubs, or gallops.  Respiratory: Normal respiratory effort without tachypnea nor retractions. Breath sounds are clear and equal bilaterally. No wheezes/rales/rhonchi. Gastrointestinal: Soft and non tender. No rebound. No guarding.  Genitourinary: Deferred Musculoskeletal: Normal range of motion in all extremities. No lower extremity edema. Neurologic:  Normal speech and language. No gross focal neurologic deficits are appreciated.  Skin:  Skin is warm, dry and intact. No rash noted. Psychiatric: Depressed. Endorses SI. ____________________________________________    LABS (pertinent positives/negatives)  Acetaminophen, salicylate, ethanol below threshold CMP wnl except glu 129 CBC wbc 12.1, hgb 11.2, plt 300  ____________________________________________   EKG  None  ____________________________________________     RADIOLOGY  None  ____________________________________________   PROCEDURES  Procedures  ____________________________________________   INITIAL IMPRESSION / ASSESSMENT AND PLAN / ED COURSE  Pertinent labs & imaging results that were available during my care of the patient were reviewed by me and considered in my medical decision making (see chart for details).   Patient presented to the emergency department today because of concerns for both depression and suicidal ideation and some threatening behavior.  Patient did not present under IVC.  On exam patient does complain of feeling suicidal.  She states she wants to lay down in traffic.  Will continue IVC and have psychiatry team evaluate.  The patient has been placed in psychiatric observation due to the need to provide a safe environment for the patient while obtaining psychiatric consultation and evaluation, as well as ongoing medical and medication management to treat the patient's condition.  The patient has been placed under full IVC at this time.   ____________________________________________   FINAL CLINICAL IMPRESSION(S) / ED DIAGNOSES  Final diagnoses:  Depression, unspecified depression type     Note: This dictation was prepared with Dragon dictation. Any transcriptional errors that result from this process are unintentional     Phineas Semen, MD 01/09/20 2145

## 2020-01-09 NOTE — ED Notes (Signed)
IVC/pending psych consult 

## 2020-01-09 NOTE — ED Notes (Signed)
Rashaun, NP in with patient at this time.

## 2020-01-09 NOTE — ED Notes (Signed)
Pt was placed in hospital provided burgundy scrubs and patients belongings which consisted of pink watch, gray earring, gray necklace, gray bracelet, pink bra, light pink shorts, light pink shorts, gray shirt, and underwear were placed in hospital bag and taken to locker.

## 2020-01-09 NOTE — ED Notes (Signed)
Patient reports she got upset at the group home because they would not give her anything else to eat.  Patient also report she was upset with another resident in the group home.  Patient states she to the group home worker she wanted to harm herself and others.

## 2020-01-10 LAB — SARS CORONAVIRUS 2 BY RT PCR (HOSPITAL ORDER, PERFORMED IN ~~LOC~~ HOSPITAL LAB): SARS Coronavirus 2: NEGATIVE

## 2020-01-10 NOTE — ED Notes (Signed)
Patient group home attendant here to pick patient up.

## 2020-01-10 NOTE — ED Notes (Signed)
Hourly rounding reveals patient sleeping in room. No complaints, stable, in no acute distress. Q15 minute rounds and monitoring via Security Cameras to continue. 

## 2020-01-10 NOTE — ED Notes (Signed)
Pt given breakfast; no other needs voiced at this time. Will continue to monitor Q15 minute rounds. 

## 2020-01-10 NOTE — BH Assessment (Signed)
Writer spoke with the patient to complete an updated/reassessment. Patient denies SI/HI and AV/H. Patients admits to getting upset with group home staff for not brining her to the hospital, so she contacted law enforcement and told them she was having SI. She denies it and shared again, she only wanted to come to the ER.   Writer called and left a HIPPA Compliant message with Group Home [phone]), requesting a return phone call. Unable to leave a message on the voicemail box for number [phone] because it was full.

## 2020-01-10 NOTE — ED Notes (Signed)
Pt given lunch tray.

## 2020-01-10 NOTE — ED Notes (Signed)
Mr Dawn Lambert at group home called for follow up ETA, as per Mr. Dawn Lambert will have someone pick patient up about 4-420pm.

## 2020-01-10 NOTE — ED Notes (Signed)
Call back received from Mr [name] will be arriving within the next 15 minutes.

## 2020-01-10 NOTE — ED Notes (Addendum)
Called patients legal guardian (mother # [phone] ) Olivya Sobol made aware that patient has been discharged form ED/Psych. Mother gave me group home # for pick. Spoke with Bernadene Person [phone]), made aware that mother has been notified of discharge and that patient needs to be picked up. As per mr. Nedra Hai was not able to come pick up patient due to not scheduling someone to work with her and stated that patient may need to stay until end of weekend. Mr. Bernadene Person made aware that this was not an option and that patient is discharged form psych/Ed and needs to be picked. Also made aware that mother was called prior to speaking to him. Mr. Cherre Huger then stated he needs a few hours. Will be following up at 1330 to discuss ETA. Mr.Larry lee owner of group home was also called [phone]) to clarify and discuss ETA.

## 2020-01-10 NOTE — Progress Notes (Signed)
Patient ID: Dawn Lambert, female   DOB: 1991-01-30, 30 y.o.   MRN: [id number]     Patient being discharged back to group home after IVC rescind and her desire to return   No activ SI Hi or plans  Already seen by FP prior to this   Rama P Rosario Adie MD   Brief entry Psychiatry

## 2020-01-13 ENCOUNTER — Other Ambulatory Visit: Payer: Self-pay | Admitting: Dermatology

## 2020-01-19 ENCOUNTER — Other Ambulatory Visit: Payer: Self-pay

## 2020-01-19 ENCOUNTER — Ambulatory Visit (INDEPENDENT_AMBULATORY_CARE_PROVIDER_SITE_OTHER): Payer: Medicaid Other | Admitting: Dermatology

## 2020-01-19 DIAGNOSIS — L409 Psoriasis, unspecified: Secondary | ICD-10-CM

## 2020-01-19 MED ORDER — FLUOCINOLONE ACETONIDE BODY 0.01 % EX OIL: TOPICAL_OIL | CUTANEOUS | 5 refills | Status: DC

## 2020-01-19 NOTE — Progress Notes (Signed)
postauricular, posterior scalp with erythema and   Follow-Up Visit   Subjective  Dawn Lambert is a 29 y.o. female who presents for the following: Psoriasis. Patient here today to get refills on dermasmoothe f/s oil to treat her psoriasis at scalp and behind ears. Patient advises it works well.   The following portions of the chart were reviewed this encounter and updated as appropriate:  Tobacco  Allergies  Meds  Problems  Med Hx  Surg Hx  Fam Hx     Review of Systems:  No other skin or systemic complaints except as noted in HPI or Assessment and Plan.  Objective  Well appearing patient in no apparent distress; mood and affect are within normal limits.  A focused examination was performed including face, scalp. Relevant physical exam findings are noted in the Assessment and Plan.  Objective  Scalp: Erythema and scale at postauricular and posterior scalp   Assessment & Plan  Psoriasis-currently flared after patient ran out of medication. Scalp Restart DermaSmoothe F/S oil 3 times a week at bedtime and wash out in the am.   Ordered Medications: Fluocinolone Acetonide Body 0.01 % OIL  Return in about 1 month (around 02/18/2020) for psoriasis .  Dawn Lambert, RMA, am acting as scribe for Armida Sans, MD . Documentation: I have reviewed the above documentation for accuracy and completeness, and I agree with the above.  Armida Sans, MD

## 2020-01-20 ENCOUNTER — Encounter: Payer: Self-pay | Admitting: Dermatology

## 2020-02-01 ENCOUNTER — Other Ambulatory Visit: Payer: Self-pay

## 2020-02-01 ENCOUNTER — Emergency Department
Admission: EM | Admit: 2020-02-01 | Discharge: 2020-02-02 | Disposition: A | Discharge status: Home / Self Care | Payer: Medicaid Other | Attending: Emergency Medicine | Admitting: Emergency Medicine

## 2020-02-01 DIAGNOSIS — Z79899 Other long term (current) drug therapy: Secondary | ICD-10-CM | POA: Diagnosis not present

## 2020-02-01 DIAGNOSIS — R45851 Suicidal ideations: Secondary | ICD-10-CM | POA: Insufficient documentation

## 2020-02-01 DIAGNOSIS — I10 Essential (primary) hypertension: Secondary | ICD-10-CM | POA: Diagnosis not present

## 2020-02-01 DIAGNOSIS — Z046 Encounter for general psychiatric examination, requested by authority: Secondary | ICD-10-CM | POA: Diagnosis not present

## 2020-02-01 DIAGNOSIS — E039 Hypothyroidism, unspecified: Secondary | ICD-10-CM | POA: Insufficient documentation

## 2020-02-01 DIAGNOSIS — Z7984 Long term (current) use of oral hypoglycemic drugs: Secondary | ICD-10-CM | POA: Insufficient documentation

## 2020-02-01 DIAGNOSIS — F4325 Adjustment disorder with mixed disturbance of emotions and conduct: Secondary | ICD-10-CM | POA: Diagnosis not present

## 2020-02-01 DIAGNOSIS — E119 Type 2 diabetes mellitus without complications: Secondary | ICD-10-CM | POA: Insufficient documentation

## 2020-02-01 LAB — COMPREHENSIVE METABOLIC PANEL
ALT: 26 U/L (ref 0–44)
AST: 24 U/L (ref 15–41)
Albumin: 3.9 g/dL (ref 3.5–5.0)
Alkaline Phosphatase: 66 U/L (ref 38–126)
Anion gap: 11 (ref 5–15)
BUN: 11 mg/dL (ref 6–20)
CO2: 22 mmol/L (ref 22–32)
Calcium: 9.2 mg/dL (ref 8.9–10.3)
Chloride: 103 mmol/L (ref 98–111)
Creatinine, Ser: 0.8 mg/dL (ref 0.44–1.00)
GFR calc Af Amer: >60 mL/min (ref >60)
GFR calc non Af Amer: >60 mL/min (ref >60)
Glucose, Bld: 155 mg/dL — ABNORMAL HIGH (ref 70–99)
Potassium: 3.9 mmol/L (ref 3.5–5.1)
Sodium: 136 mmol/L (ref 135–145)
Total Bilirubin: 0.5 mg/dL (ref 0.3–1.2)
Total Protein: 7.5 g/dL (ref 6.5–8.1)

## 2020-02-01 LAB — CBC
HCT: 33.6 % — ABNORMAL LOW (ref 36.0–46.0)
Hemoglobin: 11.1 g/dL — ABNORMAL LOW (ref 12.0–15.0)
MCH: 28.8 pg (ref 26.0–34.0)
MCHC: 33 g/dL (ref 30.0–36.0)
MCV: 87.3 fL (ref 80.0–100.0)
Platelets: 270 10*3/uL (ref 150–400)
RBC: 3.85 MIL/uL — ABNORMAL LOW (ref 3.87–5.11)
RDW: 13.1 % (ref 11.5–15.5)
WBC: 11.9 10*3/uL — ABNORMAL HIGH (ref 4.0–10.5)
nRBC: 0 % (ref 0.0–0.2)

## 2020-02-01 LAB — ETHANOL: Alcohol, Ethyl (B): <10 mg/dL (ref <10)

## 2020-02-01 LAB — LITHIUM LEVEL: Lithium Lvl: 0.47 mmol/L — ABNORMAL LOW (ref 0.60–1.20)

## 2020-02-01 LAB — ACETAMINOPHEN LEVEL: Acetaminophen (Tylenol), Serum: <10 ug/mL — ABNORMAL LOW (ref 10–30)

## 2020-02-01 LAB — SALICYLATE LEVEL: Salicylate Lvl: <7 mg/dL — ABNORMAL LOW (ref 7.0–30.0)

## 2020-02-01 MED ORDER — QUETIAPINE FUMARATE ER 200 MG PO TB24
400.0000 mg | ORAL_TABLET | Freq: Every day | ORAL | Status: DC
  Administered 2020-02-01: 400 mg via ORAL
  Filled 2020-02-01 (×2): qty 2

## 2020-02-01 MED ORDER — METFORMIN HCL 500 MG PO TABS
500.0000 mg | ORAL_TABLET | Freq: Two times a day (BID) | ORAL | Status: DC
  Administered 2020-02-01 – 2020-02-02 (×2): 500 mg via ORAL
  Filled 2020-02-01 (×2): qty 1

## 2020-02-01 MED ORDER — CARBAMAZEPINE 200 MG PO TABS
400.0000 mg | ORAL_TABLET | Freq: Two times a day (BID) | ORAL | Status: DC
  Administered 2020-02-01 – 2020-02-02 (×2): 400 mg via ORAL
  Filled 2020-02-01 (×2): qty 2

## 2020-02-01 MED ORDER — GABAPENTIN 600 MG PO TABS
600.0000 mg | ORAL_TABLET | Freq: Three times a day (TID) | ORAL | Status: DC
  Administered 2020-02-01 – 2020-02-02 (×3): 600 mg via ORAL
  Filled 2020-02-01 (×3): qty 1

## 2020-02-01 MED ORDER — LISINOPRIL 10 MG PO TABS
20.0000 mg | ORAL_TABLET | Freq: Every day | ORAL | Status: DC
  Administered 2020-02-02: 20 mg via ORAL
  Filled 2020-02-01: qty 2

## 2020-02-01 MED ORDER — LITHIUM CARBONATE 300 MG PO CAPS
900.0000 mg | ORAL_CAPSULE | Freq: Every day | ORAL | Status: DC
  Administered 2020-02-01: 900 mg via ORAL
  Filled 2020-02-01 (×2): qty 3

## 2020-02-01 MED ORDER — LEVOTHYROXINE SODIUM 50 MCG PO TABS
175.0000 ug | ORAL_TABLET | Freq: Every day | ORAL | Status: DC
  Administered 2020-02-02: 175 ug via ORAL
  Filled 2020-02-01: qty 4

## 2020-02-01 MED ORDER — VITAMIN D 25 MCG (1000 UNIT) PO TABS
1000.0000 [IU] | ORAL_TABLET | Freq: Every day | ORAL | Status: DC
  Administered 2020-02-02: 1000 [IU] via ORAL
  Filled 2020-02-01: qty 1

## 2020-02-01 MED ORDER — PROPRANOLOL HCL 20 MG PO TABS
40.0000 mg | ORAL_TABLET | Freq: Two times a day (BID) | ORAL | Status: DC
  Administered 2020-02-01 – 2020-02-02 (×3): 40 mg via ORAL
  Filled 2020-02-01 (×2): qty 2

## 2020-02-01 MED ORDER — ATORVASTATIN CALCIUM 20 MG PO TABS
10.0000 mg | ORAL_TABLET | Freq: Every day | ORAL | Status: DC
  Administered 2020-02-02: 10 mg via ORAL
  Filled 2020-02-01: qty 1

## 2020-02-01 MED ORDER — CLONAZEPAM 0.5 MG PO TABS
1.0000 mg | ORAL_TABLET | Freq: Three times a day (TID) | ORAL | Status: DC
  Administered 2020-02-01 – 2020-02-02 (×2): 1 mg via ORAL
  Filled 2020-02-01 (×2): qty 2

## 2020-02-01 MED ORDER — QUETIAPINE FUMARATE 25 MG PO TABS
25.0000 mg | ORAL_TABLET | Freq: Three times a day (TID) | ORAL | Status: DC
  Administered 2020-02-02 (×3): 25 mg via ORAL
  Filled 2020-02-01 (×3): qty 1

## 2020-02-01 MED ORDER — LORAZEPAM 2 MG PO TABS: 2.0000 mg | ORAL_TABLET | ORAL | Status: DC

## 2020-02-01 NOTE — BH Assessment (Signed)
Assessment Note Dawn Lambert is an 29 y.o. female who presents to [facility] ED involuntarily for treatment. Per triage note, Pt comes IVC after threatening SI in the road and laying naked until a car runs over here. States that she doesn't like her group home.  During TTS assessment pt presents alert, calm and oriented x 3, irritable but cooperative, and mood-congruent with affect. The pt does not appear to be responding to internal or external stimuli. Neither is the pt presenting with any delusional thinking. Pt verified the information provided to triage RN. Pt identified her main complaint to be the need for a new group home. In attempt to explore pt's needs, pt states "If I stay in that group home I will continue to take my clothes off and walk in the road". Pt identified her issue with her current group home to be her dislike of staff and is currently unable to provide any further details at this time. Per pt chart, pt has a hx of INPT with [facility] for SI/HI and is currently receiving OPT services through [facility]. Pt denies a family hx of MH or SA. Pt denies any substance use or struggles with eating or sleeping. Pt currently endorses SI with a plan but denies any current HI/AH/VH.   TTS and provider attempted to contact pt's GH ([facility] [phone]) and left a HIPPA complaint voicemail.   Per Dr. Nanine Means, NP pt will be observed overnight to be reassessed in the morning pending collateral from [facility]    Diagnosis: SI  Past Medical History:  Past Medical History:  Diagnosis Date  . Anxiety   . Arnold-Chiari malformation (HCC)   . Atrial septal defect   . Complication of anesthesia    when pt was 11, woke up during surgery (muscle biopsy)  . Constipation   . Contraception management   . Dandruff   . Dermatomyositis (HCC)    age 17  . Diabetes mellitus without complication (HCC)   . Elevated blood pressure   . Hypertension   . Hypothyroidism   . Mental retardation   . Mood disorder  (HCC)   . Overweight(278.02)   . Pelvic pain   . Prediabetes     Past Surgical History:  Procedure Laterality Date  . ASD REPAIR     at age 64  . UMBILICAL HERNIA REPAIR N/A 09/17/2017   Procedure: HERNIA REPAIR UMBILICAL ADULT;  Surgeon: Earline Mayotte, MD;  Location: [facility] ORS;  Service: General;  Laterality: N/A;    Family History: History reviewed. No pertinent family history.  Social History:  reports that she has never smoked. She has never used smokeless tobacco. She reports that she does not drink alcohol and does not use drugs.  Additional Social History:  Alcohol / Drug Use Pain Medications: see mar Prescriptions: see mar Over the Counter: see mar History of alcohol / drug use?: No history of alcohol / drug abuse  CIWA: CIWA-Ar BP: 121/80 Pulse Rate: 88 COWS:    Allergies:  Allergies  Allergen Reactions  . Augmentin [Amoxicillin-Pot Clavulanate] Other (See Comments)    Has patient had a PCN reaction causing immediate rash, facial/tongue/throat swelling, SOB or lightheadedness with hypotension: ______ Has patient had a PCN reaction causing severe rash involving mucus membranes or skin necrosis: _______ Has patient had a PCN reaction that required hospitalization: _______ Has patient had a PCN reaction occurring within the last 10 years:_______ If all of the above answers are "NO", then may proceed with  Cephalosporin use.     Home Medications: (Not in a hospital admission)   OB/GYN Status:  No LMP recorded. Patient has had an injection.  General Assessment Data Location of Assessment: [facility] ED TTS Assessment: In system Is this a Tele or Face-to-Face Assessment?: Face-to-Face Is this an Initial Assessment or a Re-assessment for this encounter?: Initial Assessment Patient Accompanied by:: N/A Language Other than English: No Living Arrangements: In Group Home: (Comment: Name of Group Home) What gender do you identify as?: Female Date Telepsych consult  ordered in [facility]: 02/01/20 Time Telepsych consult ordered in [facility]: 1555 Marital status: Single Maiden name: n/a Pregnancy Status: No Living Arrangements: Group Home Can pt return to current living arrangement?: Yes Admission Status: Involuntary Petitioner: ED Attending Is patient capable of signing voluntary admission?: No Referral Source: Other Insurance type: Medicaid    Crisis Care Plan Living Arrangements: Group Home Legal Guardian: Mother Name of Psychiatrist: [facility] Name of Therapist: [facility]  Education Status Is patient currently in school?: No Is the patient employed, unemployed or receiving disability?: Unemployed  Risk to self with the past 6 months Suicidal Ideation: Yes-Currently Present Has patient been a risk to self within the past 6 months prior to admission? : Yes Suicidal Intent: Yes-Currently Present Has patient had any suicidal intent within the past 6 months prior to admission? : Yes Is patient at risk for suicide?: No Suicidal Plan?: Yes-Currently Present Has patient had any suicidal plan within the past 6 months prior to admission? : Yes Specify Current Suicidal Plan: Lay in the middle of the road to get hit by a car  Access to Means: No What has been your use of drugs/alcohol within the last 12 months?: None reported  Previous Attempts/Gestures: Yes How many times?: 1 Other Self Harm Risks: None reported  Triggers for Past Attempts: Other personal contacts Intentional Self Injurious Behavior: None Family Suicide History: Unknown Recent stressful life event(s): Conflict (Comment) (Group Home ) Persecutory voices/beliefs?: No Depression: Yes Depression Symptoms: Feeling angry/irritable Substance abuse history and/or treatment for substance abuse?: No Suicide prevention information given to non-admitted patients: Yes  Risk to Others within the past 6 months Homicidal Ideation: No Does patient have any lifetime risk of violence toward others beyond the six  months prior to admission? : No Thoughts of Harm to Others: No Comment - Thoughts of Harm to Others: None reported  Current Homicidal Intent: No Current Homicidal Plan: No Access to Homicidal Means: No Identified Victim: None reported  History of harm to others?: No Assessment of Violence: None Noted Violent Behavior Description: None reported  Does patient have access to weapons?: No Criminal Charges Pending?: No Does patient have a court date: No Is patient on probation?: No  Psychosis Hallucinations: None noted Delusions: None noted  Mental Status Report Appearance/Hygiene: In scrubs Eye Contact: Fair Motor Activity: Freedom of movement Speech: Slow, Logical/coherent Level of Consciousness: Alert Mood: Depressed, Irritable Affect: Depressed, Irritable Anxiety Level: None Thought Processes: Coherent, Relevant Judgement: Unimpaired Orientation: Appropriate for developmental age Obsessive Compulsive Thoughts/Behaviors: None  Cognitive Functioning Concentration: Good Memory: Recent Intact, Remote Intact Is patient IDD: Yes Level of Function: low Is IQ score available?: No Insight: Poor Impulse Control: Poor Appetite: Good Have you had any weight changes? : No Change Sleep: No Change Total Hours of Sleep: 8 Vegetative Symptoms: None  ADLScreening [facility] Assessment Services) Patient's cognitive ability adequate to safely complete daily activities?: Yes Independently performs ADLs?: Yes (appropriate for developmental age)  Prior Inpatient Therapy Prior Inpatient Therapy:  Yes Prior Therapy Dates: 2021 Prior Therapy Facilty/Provider(s): [facility] Reason for Treatment: SI/HI  Prior Outpatient Therapy Prior Outpatient Therapy: Yes Prior Therapy Dates: 01-09-20 Prior Therapy Facilty/Provider(s): [facility] Reason for Treatment: Psychiatry Does patient have an ACCT team?: No Does patient have Intensive In-House Services?  : No Does patient have [facility] services? : Unknown Does  patient have P4CC services?: Unknown  ADL Screening (condition at time of admission) Patient's cognitive ability adequate to safely complete daily activities?: Yes Is the patient deaf or have difficulty hearing?: No Does the patient have difficulty seeing, even when wearing glasses/contacts?: No Does the patient have difficulty concentrating, remembering, or making decisions?: No Does the patient have difficulty dressing or bathing?: No Independently performs ADLs?: Yes (appropriate for developmental age) Does the patient have difficulty walking or climbing stairs?: No Weakness of Legs: None Weakness of Arms/Hands: None  Home Assistive Devices/Equipment Home Assistive Devices/Equipment: None  Therapy Consults (therapy consults require a physician order) PT Evaluation Needed: No OT Evalulation Needed: No SLP Evaluation Needed: No Abuse/Neglect Assessment (Assessment to be complete while patient is alone) Abuse/Neglect Assessment Can Be Completed: Yes Physical Abuse: Denies Verbal Abuse: Denies Sexual Abuse: Denies Exploitation of patient/patient's resources: Denies Self-Neglect: Denies Values / Beliefs Cultural Requests During Hospitalization: None Spiritual Requests During Hospitalization: None Consults Spiritual Care Consult Needed: No Transition of Care Team Consult Needed: No Advance Directives (For Healthcare) Does Patient Have a Medical Advance Directive?: No   Disposition:  Disposition Initial Assessment Completed for this Encounter: Yes Patient referred to: Other (Comment)  On Site Evaluation by:   Reviewed with Physician:    Opal Sidles 02/01/2020 5:34 PM

## 2020-02-01 NOTE — ED Notes (Signed)
Gave PT a snack of ice cream and gingerale

## 2020-02-01 NOTE — ED Notes (Signed)
Pharmacy waiting on return phone call before medications will be verified.

## 2020-02-01 NOTE — ED Triage Notes (Signed)
Pt comes IVC after threatening SI in the road and laying naked until a car runs over here. States that she doesn't like her group home.

## 2020-02-01 NOTE — ED Provider Notes (Addendum)
[facility] Emergency Department Provider Note   ____________________________________________   First MD Initiated Contact with Patient 02/01/20 1645     (approximate)  I have reviewed the triage vital signs and the nursing notes.   HISTORY  Chief Complaint Psychiatric Evaluation    HPI Dawn Lambert is a 29 y.o. female who says she is not happy at her group home and she says she has told her caregivers that she is going to keep running off and stripping and lying down in the street in front of cars until she either gets moved to a different group home or car runs or over and kills her.  She denies anything hurting her.  Says she otherwise feels okay.      Past Medical History:  Diagnosis Date  . Anxiety   . Arnold-Chiari malformation (HCC)   . Atrial septal defect   . Complication of anesthesia    when pt was 11, woke up during surgery (muscle biopsy)  . Constipation   . Contraception management   . Dandruff   . Dermatomyositis (HCC)    age 82  . Diabetes mellitus without complication (HCC)   . Elevated blood pressure   . Hypertension   . Hypothyroidism   . Mental retardation   . Mood disorder (HCC)   . Overweight(278.02)   . Pelvic pain   . Prediabetes     Patient Active Problem List   Diagnosis Date Noted  . 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 Intellectual disability 10/12/2017  . Umbilical hernia without obstruction and without gangrene 08/15/2017  . Chest pain 11/05/2012  . Atrial septal defect 09/19/2011    Past Surgical History:  Procedure Laterality Date  . ASD REPAIR     at age 53  . UMBILICAL HERNIA REPAIR N/A 09/17/2017   Procedure: HERNIA REPAIR UMBILICAL ADULT;  Surgeon: Earline Mayotte, MD;  Location: [facility] ORS;  Service: General;  Laterality: N/A;    Prior to Admission medications   Medication Sig Start Date End Date Taking? Authorizing Provider  acetaminophen  (TYLENOL) 325 MG tablet Take 650 mg by mouth every 6 (six) hours as needed for mild pain, moderate pain or fever.    [provider]  atorvastatin (LIPITOR) 10 MG tablet Take 10 mg by mouth daily.     [provider]  carbamazepine (TEGRETOL) 200 MG tablet Take 400 mg by mouth 2 (two) times daily.    [provider]  cholecalciferol (VITAMIN D) 1000 units tablet Take 1,000 Units by mouth daily.     [provider]  clonazePAM (KLONOPIN) 1 MG tablet Take 1 mg by mouth 3 (three) times daily.     [provider]  clotrimazole (LOTRIMIN) 1 % cream Apply 1 application topically 2 (two) times daily as needed (yeast under the breasts).     [provider]  Desoximetasone (TOPICORT) 0.25 % LIQD Apply 1 application topically daily as needed (skin issue).    [provider]  Fluocinolone Acetonide Body 0.01 % OIL Apply to scalp 3 times a week at bedtime (Mon, Wed, Fri) , leave on over night and wash out in the am. 01/19/20   Deirdre Evener, MD  Fluocinolone Acetonide Scalp (DERMA-SMOOTHE/FS SCALP) 0.01 % OIL Apply 1-5 application topically See admin instructions. Apply to scalp every week for psoriasis but may increase up to 5 days per week as needed.    [provider]  fluticasone Aleda Grana)  50 MCG/ACT nasal spray Place 2 sprays into the nose daily as needed for allergies.     [provider]  gabapentin (NEURONTIN) 600 MG tablet Take 600 mg by mouth 3 (three) times daily.    [provider]  levothyroxine (SYNTHROID, LEVOTHROID) 175 MCG tablet Take 175 mcg by mouth daily before breakfast.    [provider]  lisinopril (PRINIVIL,ZESTRIL) 20 MG tablet Take 20 mg by mouth daily.     [provider]  lithium carbonate 300 MG capsule Take 900 mg by mouth at bedtime.     [provider]  LORazepam (ATIVAN) 2 MG tablet Take 2-4 mg by mouth See admin instructions. Take 1 tablet (2mg ) by mouth daily  as scheduled and 1 tablet (2mg ) by mouth daily as needed for anxiety    [provider]  magnesium hydroxide (MILK OF MAGNESIA) 400 MG/5ML suspension Take 30 mLs by mouth at bedtime as needed for moderate constipation.     [provider]  medroxyPROGESTERone (DEPO-PROVERA) 150 MG/ML injection Inject 150 mg into the muscle every 3 (three) months.     [provider]  metFORMIN (GLUCOPHAGE) 500 MG tablet Take 500 mg by mouth 2 (two) times daily.     [provider]  mometasone (ELOCON) 0.1 % cream Apply 1 application topically daily as needed (Itching of the ears).     [provider]  Multiple Vitamin (THEREMS PO) Take 1 tablet by mouth daily.    [provider]  Nutritional Supplements (JOINT FORMULA PO) Take 1 tablet by mouth 3 (three) times daily.    [provider]  Omega-3 1000 MG CAPS Take 1,000 mg by mouth 2 (two) times daily.    [provider]  Paliperidone Palmitate ER (INVEGA TRINZA) 819 MG/2.625ML SUSY Inject 819 mg into the muscle every 3 (three) months.    [provider]  propranolol (INDERAL) 40 MG tablet Take 40 mg by mouth 2 (two) times daily.    [provider]  QUEtiapine (SEROQUEL XR) 400 MG 24 hr tablet Take 1 tablet (400 mg total) by mouth at bedtime. 10/08/18   , MD  QUEtiapine (SEROQUEL) 25 MG tablet Take 1 tablet (25 mg total) by mouth 3 (three) times daily with meals. 10/08/18   Mariel Craft, MD    Allergies Augmentin [amoxicillin-pot clavulanate]  History reviewed. No pertinent family history.  Social History Social History   Tobacco Use  . Smoking status: Never Smoker  . Smokeless tobacco: Never Used  Vaping Use  . Vaping Use: Never used  Substance Use Topics  . Alcohol use: No  . Drug use: No    Review of Systems  Constitutional: No fever/chills Eyes: No visual changes. ENT: No sore throat. Cardiovascular: Denies chest pain. Respiratory: Denies  shortness of breath. Gastrointestinal: No abdominal pain.  No nausea, no vomiting.  No diarrhea.  No constipation. Genitourinary: Negative for dysuria. Musculoskeletal: Negative for back pain. Skin: Negative for rash. Neurological: Negative for headaches, focal weakness   ____________________________________________   PHYSICAL EXAM:  VITAL SIGNS: ED Triage Vitals  Enc Vitals Group     BP 02/01/20 1608 121/80     Pulse Rate 02/01/20 1608 88     Resp 02/01/20 1608 18     Temp 02/01/20 1608 99.5 F (37.5 C)     Temp Source 02/01/20 1608 Oral     SpO2 02/01/20 1608 98 %     Weight 02/01/20 1605 268 lb 15.4 oz (  122 kg)     Height 02/01/20 1605 5\' 2"  (1.575 m)     Head Circumference --      Peak Flow --      Pain Score 02/01/20 1605 0     Pain Loc --      Pain Edu? --      Excl. in GC? --     Constitutional: Alert and oriented. Well appearing and in no acute distress. Eyes: Conjunctivae are normal.  Head: Atraumatic. Nose: No congestion/rhinnorhea. Mouth/Throat: Mucous membranes are moist.  Oropharynx non-erythematous. Neck: No stridor. Cardiac: noRmal heart sounds.  Good peripheral circulation. Respiratory: Normal respiratory effort.  No retractions. Lungs CTAB. Gastrointestinal: Soft and nontender. No distention. No abdominal bruits.  Musculoskeletal: No lower extremity tenderness nor edema.   Neurologic:  Normal speech and language. No gross focal neurologic deficits are appreciated.  Skin:  Skin is warm, dry and intact. No rash noted.   ____________________________________________   LABS (all labs ordered are listed, but only abnormal results are displayed)  Labs Reviewed  COMPREHENSIVE METABOLIC PANEL - Abnormal; Notable for the following components:      Result Value   Glucose, Bld 155 (*)    All other components within normal limits  CBC - Abnormal; Notable for the following components:   WBC 11.9 (*)    RBC 3.85 (*)    Hemoglobin 11.1 (*)    HCT 33.6 (*)     All other components within normal limits  ETHANOL  SALICYLATE LEVEL  ACETAMINOPHEN LEVEL  URINE DRUG SCREEN, QUALITATIVE ([facility] ONLY)  POC URINE PREG, ED   ____________________________________________  EKG   ____________________________________________  RADIOLOGY 04/02/20, personally viewed and evaluated these images (plain radiographs) as part of my medical decision making, as well as reviewing the written report by the radiologist.  ED MD interpretation:    Official radiology report(s): No results found.  ____________________________________________   PROCEDURES  Procedure(s) performed (including Critical Care):  Procedures   ____________________________________________   INITIAL IMPRESSION / ASSESSMENT AND PLAN / ED COURSE  We will consult psych and TTS.  Possibly we can move her to the her group home.  The patient has been placed in psychiatric observation due to the need to provide a safe environment for the patient while obtaining psychiatric consultation and evaluation, as well as ongoing medical and medication management to treat the patient's condition.  The patient has been placed under full IVC at this time.              ____________________________________________   FINAL CLINICAL IMPRESSION(S) / ED DIAGNOSES  Final diagnoses:  Suicidal ideation     ED Discharge Orders    None      *Please note:  TANAYIA WAHLQUIST was evaluated in Emergency Department on 02/01/2020 for the symptoms described in the history of present illness. She was evaluated in the context of the global COVID-19 pandemic, which necessitated consideration that the patient might be at risk for infection with the SARS-CoV-2 virus that causes COVID-19. Institutional protocols and algorithms that pertain to the evaluation of patients at risk for COVID-19 are in a state of rapid change based on information released by regulatory bodies including the CDC and federal  and state organizations. These policies and algorithms were followed during the patient's care in the ED.  Some ED evaluations and interventions may be delayed as a result of limited staffing during and the pandemic.*   Note:  This document was prepared using  [employer] engineervoice recognition software and may include unintentional dictation errors.    Arnaldo NatalMalinda, Mitsy Owen F, MD 02/01/20 1648    Arnaldo NatalMalinda, Kerianne Gurr F, MD 02/01/20 2326

## 2020-02-01 NOTE — ED Notes (Addendum)
Personal belongings: dress, flip flops, bracelet brown blanket, and watch

## 2020-02-01 NOTE — Consult Note (Addendum)
Dawn Lambert   Reason for Lambert:  Upset at her group home Referring Physician:  EDP Patient Identification: Dawn Lambert MRN:  [id number] Principal Diagnosis: Adjustment disorder with mixed disturbance of emotions and conduct Diagnosis:  Principal Problem:   Adjustment disorder with mixed disturbance of emotions and conduct   Total Time spent with patient: 45 minutes  Subjective:   Dawn Lambert is a 29 y.o. female patient admitted with agitation at her group home.  HPI:  Patient seen and evaluated in person by this provider.  She was slightly anxious as she was upset at her group home and took her clothes off and ran into the street.  "I don't want to go back or I will keep taking my clothes off and running into the street."  Discussed what she does not like about her group home.   The staff--Crystal, Rose, and another staff.  She could not explain why she did not like them.  Last time this provider saw her, she was upset she could not use her "coupons" she obtained by giving false food reviews and could not get her free snow cone or cake.  Denies suicidal/homicidal ideations, hallucinations, or substance use.  Considering she just arrived, relatively calm and could communicate.  Stated the police said she needed a higher level of care, directed her to her mother deciding this as she is her guardian.    Called the group home with no response.  Past Psychiatric History: anxiety, IDD  Risk to Self:  none Risk to Others:  none Prior Inpatient Therapy:   a few Prior Outpatient Therapy:  present, in place  Past Medical History:  Past Medical History:  Diagnosis Date  . Anxiety   . Arnold-Chiari malformation (HCC)   . Atrial septal defect   . Complication of anesthesia    when pt was 11, woke up during surgery (muscle biopsy)  . Constipation   . Contraception management   . Dandruff   . Dermatomyositis (HCC)    age 54  . Diabetes mellitus without  complication (HCC)   . Elevated blood pressure   . Hypertension   . Hypothyroidism   . Mental retardation   . Mood disorder (HCC)   . Overweight(278.02)   . Pelvic pain   . Prediabetes     Past Surgical History:  Procedure Laterality Date  . ASD REPAIR     at age 75  . UMBILICAL HERNIA REPAIR N/A 09/17/2017   Procedure: HERNIA REPAIR UMBILICAL ADULT;  Surgeon: Earline Mayotte, MD;  Location: [facility] ORS;  Service: General;  Laterality: N/A;   Family History: History reviewed. No pertinent family history. Family Psychiatric  History: none Social History:  Social History   Substance and Sexual Activity  Alcohol Use No     Social History   Substance and Sexual Activity  Drug Use No    Social History   Socioeconomic History  . Marital status: Single    Spouse name: Not on file  . Number of children: Not on file  . Years of education: Not on file  . Highest education level: Not on file  Occupational History  . Not on file  Tobacco Use  . Smoking status: Never Smoker  . Smokeless tobacco: Never Used  Vaping Use  . Vaping Use: Never used  Substance and Sexual Activity  . Alcohol use: No  . Drug use: No  . Sexual activity: Not on file  Other Topics Concern  .  Not on file  Social History Narrative  . Not on file   Social Determinants of Health   Financial Resource Strain:   . Difficulty of Paying Living Expenses: Not on file  Food Insecurity:   . Worried About Programme researcher, broadcasting/film/video in the Last Year: Not on file  . Ran Out of Food in the Last Year: Not on file  Transportation Needs:   . Lack of Transportation (Medical): Not on file  . Lack of Transportation (Non-Medical): Not on file  Physical Activity:   . Days of Exercise per Week: Not on file  . Minutes of Exercise per Session: Not on file  Stress:   . Feeling of Stress : Not on file  Social Connections:   . Frequency of Communication with Friends and Family: Not on file  . Frequency of Social Gatherings  with Friends and Family: Not on file  . Attends Religious Services: Not on file  . Active Member of Clubs or Organizations: Not on file  . Attends Banker Meetings: Not on file  . Marital Status: Not on file   Additional Social History:    Allergies:   Allergies  Allergen Reactions  . Augmentin [Amoxicillin-Pot Clavulanate] Other (See Comments)    Has patient had a PCN reaction causing immediate rash, facial/tongue/throat swelling, SOB or lightheadedness with hypotension: ______ Has patient had a PCN reaction causing severe rash involving mucus membranes or skin necrosis: _______ Has patient had a PCN reaction that required hospitalization: _______ Has patient had a PCN reaction occurring within the last 10 years:_______ If all of the above answers are "NO", then may proceed with Cephalosporin use.     Labs:  Results for orders placed or performed during the hospital encounter of 02/01/20 (from the past 48 hour(s))  Comprehensive metabolic panel     Status: Abnormal   Collection Time: 02/01/20  4:12 PM  Result Value Ref Range   Sodium 136 135 - 145 mmol/L   Potassium 3.9 3.5 - 5.1 mmol/L   Chloride 103 98 - 111 mmol/L   CO2 22 22 - 32 mmol/L   Glucose, Bld 155 (H) 70 - 99 mg/dL    Comment: Glucose reference range applies only to samples taken after fasting for at least 8 hours.   BUN 11 6 - 20 mg/dL   Creatinine, Ser 4.76 0.44 - 1.00 mg/dL   Calcium 9.2 8.9 - 54.6 mg/dL   Total Protein 7.5 6.5 - 8.1 g/dL   Albumin 3.9 3.5 - 5.0 g/dL   AST 24 15 - 41 U/L   ALT 26 0 - 44 U/L   Alkaline Phosphatase 66 38 - 126 U/L   Total Bilirubin 0.5 0.3 - 1.2 mg/dL   GFR calc non Af Amer >60 >60 mL/min   GFR calc Af Amer >60 >60 mL/min   Anion gap 11 5 - 15    Comment: Performed at [facility], [address] [place], [name] [address]  Ethanol     Status: None   Collection Time: 02/01/20  4:12 PM  Result Value Ref Range   Alcohol, Ethyl (B) <10 <10 mg/dL     Comment: (NOTE) Lowest detectable limit for serum alcohol is 10 mg/dL.  For medical purposes only. Performed at [facility], [address], [place], [name] [address]   Salicylate level     Status: Abnormal   Collection Time: 02/01/20  4:12 PM  Result Value Ref Range   Salicylate Lvl <7.0 (  L) 7.0 - 30.0 mg/dL    Comment: Performed at [facility], [address] [place], [place] [address]  Acetaminophen level     Status: Abnormal   Collection Time: 02/01/20  4:12 PM  Result Value Ref Range   Acetaminophen (Tylenol), Serum <10 (L) 10 - 30 ug/mL    Comment: (NOTE) Therapeutic concentrations vary significantly. A range of 10-30 ug/mL  may be an effective concentration for many patients. However, some  are best treated at concentrations outside of this range. Acetaminophen concentrations >150 ug/mL at 4 hours after ingestion  and >50 ug/mL at 12 hours after ingestion are often associated with  toxic reactions.  Performed at [facility], [address] [place], [place] [address]   cbc     Status: Abnormal   Collection Time: 02/01/20  4:12 PM  Result Value Ref Range   WBC 11.9 (H) 4.0 - 10.5 K/uL   RBC 3.85 (L) 3.87 - 5.11 MIL/uL   Hemoglobin 11.1 (L) 12.0 - 15.0 g/dL   HCT 56.233.6 (L) 36 - 46 %   MCV 87.3 80.0 - 100.0 fL   MCH 28.8 26.0 - 34.0 pg   MCHC 33.0 30.0 - 36.0 g/dL   RDW 13.013.1 86.511.5 - 78.415.5 %   Platelets 270 150 - 400 K/uL   nRBC 0.0 0.0 - 0.2 %    Comment: Performed at [facility], [address] [place], [place] [address]    No current facility-administered medications for this encounter.   Current Outpatient Medications  Medication Sig Dispense Refill  . acetaminophen (TYLENOL) 325 MG tablet Take 650 mg by mouth every 6 (six) hours as needed for mild pain, moderate pain or fever.    [name]. atorvastatin (LIPITOR) 10 MG tablet Take 10 mg by mouth daily.     . carbamazepine (TEGRETOL) 200 MG tablet Take 400 mg by mouth 2 (two)  times daily.    . cholecalciferol (VITAMIN D) 1000 units tablet Take 1,000 Units by mouth daily.     . clonazePAM (KLONOPIN) 1 MG tablet Take 1 mg by mouth 3 (three) times daily.     . clotrimazole (LOTRIMIN) 1 % cream Apply 1 application topically 2 (two) times daily as needed (yeast under the breasts).     . Desoximetasone (TOPICORT) 0.25 % LIQD Apply 1 application topically daily as needed (skin issue).    . Fluocinolone Acetonide Body 0.01 % OIL Apply to scalp 3 times a week at bedtime (Mon, Wed, Fri) , leave on over night and wash out in the am. 120 mL 5  . Fluocinolone Acetonide Scalp (DERMA-SMOOTHE/FS SCALP) 0.01 % OIL Apply 1-5 application topically See admin instructions. Apply to scalp every week for psoriasis but may increase up to 5 days per week as needed.    . fluticasone (FLONASE) 50 MCG/ACT nasal spray Place 2 sprays into the nose daily as needed for allergies.     [name]. gabapentin (NEURONTIN) 600 MG tablet Take 600 mg by mouth 3 (three) times daily.    [name]. levothyroxine (SYNTHROID, LEVOTHROID) 175 MCG tablet Take 175 mcg by mouth daily before breakfast.    . lisinopril (PRINIVIL,ZESTRIL) 20 MG tablet Take 20 mg by mouth daily.     [name]. lithium carbonate 300 MG capsule Take 900 mg by mouth at bedtime.     [name]. LORazepam (ATIVAN) 2 MG tablet Take 2-4 mg by mouth See admin instructions. Take 1 tablet (2mg ) by mouth daily as scheduled and 1 tablet (2mg ) by  mouth daily as needed for anxiety    . magnesium hydroxide (MILK OF MAGNESIA) 400 MG/5ML suspension Take 30 mLs by mouth at bedtime as needed for moderate constipation.     . medroxyPROGESTERone (DEPO-PROVERA) 150 MG/ML injection Inject 150 mg into the muscle every 3 (three) months.     . metFORMIN (GLUCOPHAGE) 500 MG tablet Take 500 mg by mouth 2 (two) times daily.     . mometasone (ELOCON) 0.1 % cream Apply 1 application topically daily as needed (Itching of the ears).     . Multiple Vitamin (THEREMS PO) Take 1 tablet by mouth daily.    .  Nutritional Supplements (JOINT FORMULA PO) Take 1 tablet by mouth 3 (three) times daily.    . Omega-3 1000 MG CAPS Take 1,000 mg by mouth 2 (two) times daily.    . Paliperidone Palmitate ER (INVEGA TRINZA) 819 MG/2.625ML SUSY Inject 819 mg into the muscle every 3 (three) months.    . propranolol (INDERAL) 40 MG tablet Take 40 mg by mouth 2 (two) times daily.    . QUEtiapine (SEROQUEL XR) 400 MG 24 hr tablet Take 1 tablet (400 mg total) by mouth at bedtime. 60 tablet 2  . QUEtiapine (SEROQUEL) 25 MG tablet Take 1 tablet (25 mg total) by mouth 3 (three) times daily with meals. 90 tablet 2    Musculoskeletal: Strength & Muscle Tone: within normal limits Gait & Station: normal Patient leans: N/A  Psychiatric Specialty Exam: Physical Exam Vitals and nursing note reviewed.  Constitutional:      Appearance: Normal appearance.  HENT:     Head: Normocephalic.     Nose: Nose normal.  Pulmonary:     Effort: Pulmonary effort is normal.  Musculoskeletal:        General: Normal range of motion.     Cervical back: Normal range of motion.  Neurological:     General: No focal deficit present.     Mental Status: She is alert and oriented to person, place, and time.  Psychiatric:        Attention and Perception: Attention and perception normal.        Mood and Affect: Mood is anxious. Affect is blunt.        Speech: Speech normal.        Behavior: Behavior normal.        Thought Content: Thought content normal.        Cognition and Memory: Cognition and memory normal.        Judgment: Judgment is impulsive.     Review of Systems  Psychiatric/Behavioral: Positive for behavioral problems. The patient is nervous/anxious.   All other systems reviewed and are negative.   Blood pressure 121/80, pulse 88, temperature 99.5 F (37.5 C), temperature source Oral, resp. rate 18, height  (1.575 m), weight 122 kg, SpO2 98 %.Body mass index is 49.19 kg/m.  General Appearance: Casual  Eye Contact:   Good  Speech:  Normal Rate  Volume:  Normal  Mood:  Anxious  Affect:  Blunt  Thought Process:  Coherent and Descriptions of Associations: Intact  Orientation:  Full (Time, Place, and Person)  Thought Content:  WDL and Logical  Suicidal Thoughts:  No  Homicidal Thoughts:  No  Memory:  Immediate;   Fair Recent;   Fair Remote;   Fair  Judgement:  Fair  Insight:  Fair  Psychomotor Activity:  Normal  Concentration:  Concentration: Fair and Attention Span: Fair  Recall:  [employer]  of Knowledge:  Fair  Language:  Fair  Akathisia:  No  Handed:  Right  AIMS (if indicated):     Assets:  Housing Leisure Time Physical Health Resilience Social Support  ADL's:  Intact  Cognition:  Impaired,  Mild  Sleep:        Treatment Plan Summary: Daily contact with patient to assess and evaluate symptoms and progress in treatment, Medication management and Plan Adjustment disorder with mixed disturbance of emotions and conduct:  -Restarted Tegretol 400 mg BID -Restarted gabapentin 600 mg TID -Restarted Lithium 900 mg at bedtime daily -Restarted Seroquel 25 mg TID and 400 mg at bedtime  Anxiety: -Continue Klonopin 1 mg TID  Disposition: Patient does not meet criteria for psychiatric inpatient admission.  Nanine Means, NP 02/01/2020 5:02 PM

## 2020-02-01 NOTE — ED Notes (Signed)
Pt given dinner tray.

## 2020-02-02 LAB — URINE DRUG SCREEN, QUALITATIVE (ARMC ONLY)
Amphetamines, Ur Screen: NOT DETECTED
Barbiturates, Ur Screen: NOT DETECTED
Benzodiazepine, Ur Scrn: POSITIVE — AB
Cannabinoid 50 Ng, Ur ~~LOC~~: NOT DETECTED
Cocaine Metabolite,Ur ~~LOC~~: NOT DETECTED
MDMA (Ecstasy)Ur Screen: NOT DETECTED
Methadone Scn, Ur: NOT DETECTED
Opiate, Ur Screen: NOT DETECTED
Phencyclidine (PCP) Ur S: NOT DETECTED
Tricyclic, Ur Screen: POSITIVE — AB

## 2020-02-02 LAB — PREGNANCY, URINE: Preg Test, Ur: NEGATIVE

## 2020-02-02 MED ORDER — DULOXETINE HCL 20 MG PO CPEP: 20.0000 mg | ORAL_CAPSULE | Freq: Every day | ORAL | 0 refills | Status: DC

## 2020-02-02 MED ORDER — DULOXETINE HCL 20 MG PO CPEP
20.0000 mg | ORAL_CAPSULE | Freq: Every day | ORAL | Status: DC
  Filled 2020-02-02: qty 1

## 2020-02-02 NOTE — Final Progress Note (Signed)
Physician Final Progress Note  Patient ID: Dawn Lambert MRN: [id number] DOB/AGE: [birth year] 29 y.o.  Admit date: 02/01/2020 Admitting provider: No admitting provider for patient encounter. Discharge date: 02/02/2020   Admission Diagnoses:-----   Adjustment disorder  Schizoaffective disorder  Generalized anxiety    Discharge Diagnoses:  Principal Problem:   Adjustment disorder with mixed disturbance of emotions and conduct  And see above   Consults:  TTS / ER MD PSYCH MD   Significant Findings/ Diagnostic Studies: none   Procedures:  MD rounds TTS rounds   Discharge Condition: fair Okay     Disposition: back to group home   Diet:  Low fat diet   Discharge Activity:  Needs structured creativity exercise outings     Follow-up Information    Burns, Shellia Cleverly, MD.   Specialty: Internal Medicine Why: As needed Contact information: [address] [name] [address] [phone]              She has meds at home IVC rescinded she contracts for safety  Cymbalta added for depression and anxiety has psych follow up Wednesday   Relatively frequent visitor feels stuck depressed at group home and wanted to be in middle of street  She is bored of the food and people there but does not know how to cope   Notified guardian so they can help her  But contracts for safety       Alert cooperative strange disheveled haggard unkept    Oriented times four Rapport strange and odd at baseline Mood at baseline strange and dysphoric No shakes tremors and tics Obese Judgement insight reliability all fair to poor Intelligence and fund of knowledge all below average Memory without acute change No active SI H I or plans  Concentration and attention fair  Consciousness not clouded or fluctuant Abstraction poor Thought process and content ----depressive anxious boredom themes Eye contact strange and odd    Leans forward Gait and station limited by  weight Language english No akathisia  Cognition lowered Recall fair Musculoskeletal no change Assets  Caring group and guardian adls  At times needs redirection and support Aims not done  Sleep okay    Caucasian female with chronic illness who needs antidepressant added today and followup on Wednesday  --other meds as is at home.    Guardian to pick up   TTS and ER MD notifired   No other new side effects or medical problems mentioned                   Total time spent taking care of this patient: 30 or so  minutes  Signed: Roselind Messier 02/02/2020, 4:39 PM

## 2020-02-02 NOTE — ED Notes (Signed)
Pt given breakfast tray

## 2020-02-02 NOTE — Final Progress Note (Deleted)
Physician Final Progress Note  Patient ID: Dawn Lambert MRN: [id number] DOB/AGE: 1990/07/18 29 y.o.  Admit date: 02/01/2020 Admitting provider: No admitting provider for patient encounter. Discharge date: 02/02/2020   Admission Diagnoses: S/a disorder  Generalized anxiety  Adjustment disorder   Discharge Diagnoses:  Principal Problem:   Adjustment disorder with mixed disturbance of emotions and conduct   Same   Consults:   TTS / / Psych MD ER MD   Significant Findings/ Diagnostic Studies: {cocaine positive benzo pos   Procedures: none   Discharge Condition: {fair   Disposition:  shelter with [facility] referral   Navane 4 bid with artane 1 bid  Along with Celexa 20 daily    #21 each   Diet: {as tolerated    Discharge Activity: not known but recommend drug rehab   1/2 [address] houses AA nA related programming [facility]    She has no active SI or HI  She came overnight and wanted to leave today   She was advised of need for meds and regular followup   She has no other new problems but uses poor judgement insight and reliability to leave.    Contracts for safety   All referrals given  No acute side effects or medical problems at discharge    Strange odd irresponsible leaving for cocaine craving --oriented times four  No shakes and tremors does not want to finish CIWA No frank psychosis or mania for thought process and content  Memory no acute changes  Fund of knowledge intelligence judgment insight reliability all poor Concentration and attention fair Abstraction concrete Unkept   Haggard Rapport eye contact poor  Consciousness not clouded or fluctuant No frank SI or HI   Leans not known  Handedness not known  Gait and station not known  Recall poor adl's poor Akathisia none Assets not clear Language english  Aims not done Sleep on and off lives on streets For two years  Cognition poor when intoxicated                       Follow-up Information        Burns, Shellia Cleverly, MD.   Specialty: Internal Medicine Why: As needed Contact information: [address] [name] [address] [phone]                Total time spent taking care of this patient: at l east 30  minutes  Signed: Roselind Messier 02/02/2020, 4:16 PM        Note Details  Author Roselind Messier, MD File Time 02/02/2020 4:27 PM  Author Type Physician Status Signed  Last Editor Roselind Messier, MD Service Psychiatry  Hospital Acct # [id number] Admit Date 02/01/2020

## 2020-02-02 NOTE — ED Provider Notes (Signed)
Emergency Medicine Observation Re-evaluation Note  Dawn Lambert is a 29 y.o. female, seen on rounds today.  Pt initially presented to the ED for complaints of Psychiatric Evaluation Currently, the patient is sleeping.  Physical Exam  BP 125/65 (BP Location: Right Arm)   Pulse 75   Temp (!) 97.5 F (36.4 C) (Oral)   Resp 17   Ht 1.575 m (5\' 2" )   Wt 122 kg   SpO2 100%   BMI 49.19 kg/m  Physical Exam Gen:  No acute distress Resp:  Breathing easily and comfortably, no accessory muscle usage Neuro:  Moving all four extremities, no gross focal neuro deficits Psych:  Resting currently, calm and cooperative when awake  ED Course / MDM  EKG:    I have reviewed the labs performed to date as well as medications administered while in observation.  Recent changes in the last 24 hours include assessment by psychiatry.  Plan  Current plan is for group home placement.  The patient does not like her facility and claims she is going to keep putting herself in danger until she gets out of that particular location.  Psychiatry does not feel that she meets IVC criteria but they also did not revoke the IVC.  Patient is under full IVC at this time.   , MD 02/02/20 [phone]

## 2020-02-02 NOTE — BH Assessment (Signed)
TTS made contact with [facility] owner Stanford Breed [phone]): Dawn Lambert confirmed pt to be a resident and to be aware of her admission. TTS also, spoke to Dawn Lambert) who is the Armed forces technical officer of the [facility]. Dawn Lambert provided updated contact information and identified pt presenting symptoms as her baseline. Dawn Lambert identified holidays to be a trigger for pt's SI and expressed to be unsure why. Dawn Lambert reports pt getting upset yesterday after a conversation held about Halloween and pt's behaviors escalating from there (SI, verbal aggression, demands to go to hospital). Dawn Lambert denied any current concerns and confirmed pt's ability to return to the [facility].   [facility] main number: [phone] Dawn Lambert: [phone]  [facility] crisis number: [phone]  TTS made contact with Pt mother and guardian Dawn Lambert [phone]) who confirmed the information provided by the [facility]. Renee expressed no current concerns and agreed to pt's current disposition.   Per Dr. Smith Robert pt will be discharged back to [facility]

## 2020-02-02 NOTE — ED Notes (Signed)
Patient is excited to be discharged, all belongings given to her, Patient's group home was called and they are here to transport her back, she is cooperative, no signs of distress.

## 2020-02-02 NOTE — ED Notes (Signed)
Pt refused covid swab to be done. Pt stated "I am discharged. I am going back to my group home. We are close like family there and I do not need it done."

## 2020-02-02 NOTE — ED Notes (Signed)
Patient is sitting up, ask what the plan is, nurse told her she may be going back to group home, and she said " Ok" nurse let her know that once she heard from the Doctor for sure she will let her know, Patient remained calm and states " ok" . Nurse will continue to monitor, no signs of distress, no behavioral issues noted

## 2020-02-02 NOTE — ED Notes (Signed)
Gave food tray with juice. 

## 2020-02-02 NOTE — Final Progress Note (Deleted)
Physician Final Progress Note  Patient ID: Dawn Lambert MRN: [id number] DOB/AGE: 30-21-1992 30 y.o.  Admit date: 02/01/2020 Admitting provider: No admitting provider for patient encounter. Discharge date: 02/02/2020   Admission Diagnoses: S/a disorder  Generalized anxiety  Adjustment disorder   Discharge Diagnoses:  Principal Problem:   Adjustment disorder with mixed disturbance of emotions and conduct   Same   Consults:   TTS / / Psych MD ER MD   Significant Findings/ Diagnostic Studies: {cocaine positive benzo pos   Procedures: none   Discharge Condition: {fair   Disposition:  shelter with [facility] referral   Navane 4 bid with artane 1 bid  Along with Celexa 20 daily    #21 each   Diet: {as tolerated    Discharge Activity: not known but recommend drug rehab   1/2 way houses AA nA related programming [facility]    She has no active SI or HI  She came overnight and wanted to leave today   She was advised of need for meds and regular followup   She has no other new problems but uses poor judgement insight and reliability to leave.    Contracts for safety   All referrals given  No acute side effects or medical problems at discharge    Strange odd irresponsible leaving for cocaine craving --oriented times four  No shakes and tremors does not want to finish CIWA No frank psychosis or mania for thought process and content  Memory no acute changes  Fund of knowledge intelligence judgment insight reliability all poor Concentration and attention fair Abstraction concrete Unkept   Haggard Rapport eye contact poor  Consciousness not clouded or fluctuant No frank SI or HI   Leans not known  Handedness not known  Gait and station not known  Recall poor adl's poor Akathisia none Assets not clear Language english  Aims not done Sleep on and off lives on streets For two years  Cognition poor when intoxicated                   Follow-up Information    Burns,  Shellia Cleverly, MD.   Specialty: Internal Medicine Why: As needed Contact information: [address] [name] [address] [phone]               Total time spent taking care of this patient: at l east 30  minutes  Signed: Roselind Messier 02/02/2020, 4:16 PM

## 2020-02-02 NOTE — ED Notes (Signed)
VOL, papers rescinded, pend dispo 

## 2020-02-06 ENCOUNTER — Other Ambulatory Visit: Payer: Self-pay

## 2020-02-06 ENCOUNTER — Emergency Department
Admission: EM | Admit: 2020-02-06 | Discharge: 2020-02-06 | Disposition: A | Discharge status: Home / Self Care | Payer: Medicaid Other | Attending: Emergency Medicine | Admitting: Emergency Medicine

## 2020-02-06 ENCOUNTER — Encounter: Payer: Self-pay | Admitting: Emergency Medicine

## 2020-02-06 DIAGNOSIS — E039 Hypothyroidism, unspecified: Secondary | ICD-10-CM | POA: Diagnosis not present

## 2020-02-06 DIAGNOSIS — Z7984 Long term (current) use of oral hypoglycemic drugs: Secondary | ICD-10-CM | POA: Insufficient documentation

## 2020-02-06 DIAGNOSIS — S0990XA Unspecified injury of head, initial encounter: Secondary | ICD-10-CM | POA: Diagnosis present

## 2020-02-06 DIAGNOSIS — Z79890 Hormone replacement therapy (postmenopausal): Secondary | ICD-10-CM | POA: Insufficient documentation

## 2020-02-06 DIAGNOSIS — Y92009 Unspecified place in unspecified non-institutional (private) residence as the place of occurrence of the external cause: Secondary | ICD-10-CM | POA: Diagnosis not present

## 2020-02-06 DIAGNOSIS — S0101XA Laceration without foreign body of scalp, initial encounter: Secondary | ICD-10-CM | POA: Diagnosis not present

## 2020-02-06 DIAGNOSIS — I10 Essential (primary) hypertension: Secondary | ICD-10-CM | POA: Diagnosis not present

## 2020-02-06 DIAGNOSIS — W06XXXA Fall from bed, initial encounter: Secondary | ICD-10-CM | POA: Insufficient documentation

## 2020-02-06 DIAGNOSIS — E119 Type 2 diabetes mellitus without complications: Secondary | ICD-10-CM | POA: Diagnosis not present

## 2020-02-06 DIAGNOSIS — Z79899 Other long term (current) drug therapy: Secondary | ICD-10-CM | POA: Diagnosis not present

## 2020-02-06 MED ORDER — LIDOCAINE HCL (PF) 1 % IJ SOLN
5.0000 mL | Freq: Once | INTRAMUSCULAR | Status: AC
  Administered 2020-02-06: 5 mL
  Filled 2020-02-06: qty 5

## 2020-02-06 NOTE — Discharge Instructions (Addendum)
See your provider in 7-10 days for staple removal.

## 2020-02-06 NOTE — ED Notes (Signed)
Pt has laceration to back of head. States she lives in a group home and fell and hit her head on her nightstand.

## 2020-02-06 NOTE — ED Notes (Signed)
This RN notified pt's mother/legal guardian of discharge. Per mother group home will provide transportation back to [facility]

## 2020-02-06 NOTE — ED Triage Notes (Signed)
Pt to ER states she fell off the bed and hit her head on the bedside table.  States there is a "hole in her head", no noted bleeding at this time.

## 2020-02-06 NOTE — ED Provider Notes (Signed)
[facility] Emergency Department Provider Note ____________________________________________  Time seen: 1830  I have reviewed the triage vital signs and the nursing notes.  HISTORY  Chief Complaint  Fall  HPI Dawn Lambert is a 29 y.o. female presents to the ED accompanied by her CEO from her group home, for evaluation of accidental laceration to the scalp.  Patient apparently fell out of her bed, hitting the back of her head, on her nightstand.  She denies any close head injury symptoms, nausea, vomiting, or dizziness.  She presents now with a linear laceration of the posterior scalp with no active bleeding.  She reports pain localized to the scalp denies any other injury or complaints at this time.   Past Medical History:  Diagnosis Date  . Anxiety   . Arnold-Chiari malformation (HCC)   . Atrial septal defect   . Complication of anesthesia    when pt was 11, woke up during surgery (muscle biopsy)  . Constipation   . Contraception management   . Dandruff   . Dermatomyositis (HCC)    age 3  . Diabetes mellitus without complication (HCC)   . Elevated blood pressure   . Hypertension   . Hypothyroidism   . Mental retardation   . Mood disorder (HCC)   . Overweight(278.02)   . Pelvic pain   . Prediabetes     Patient Active Problem List   Diagnosis Date Noted  . 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 Intellectual disability 10/12/2017  . Umbilical hernia without obstruction and without gangrene 08/15/2017  . Chest pain 11/05/2012  . Atrial septal defect 09/19/2011    Past Surgical History:  Procedure Laterality Date  . ASD REPAIR     at age 89  . UMBILICAL HERNIA REPAIR N/A 09/17/2017   Procedure: HERNIA REPAIR UMBILICAL ADULT;  Surgeon: Earline Mayotte, MD;  Location: [facility] ORS;  Service: General;  Laterality: N/A;    Prior to Admission medications   Medication Sig Start Date End Date Taking?  Authorizing Provider  acetaminophen (TYLENOL) 325 MG tablet Take 650 mg by mouth every 6 (six) hours as needed for mild pain, moderate pain or fever.    [provider]  atorvastatin (LIPITOR) 10 MG tablet Take 10 mg by mouth daily.     [provider]  carbamazepine (TEGRETOL) 200 MG tablet Take 400 mg by mouth 2 (two) times daily.    [provider]  cholecalciferol (VITAMIN D) 1000 units tablet Take 1,000 Units by mouth daily.     [provider]  clonazePAM (KLONOPIN) 1 MG tablet Take 1 mg by mouth 3 (three) times daily.     [provider]  clotrimazole (LOTRIMIN) 1 % cream Apply 1 application topically 2 (two) times daily as needed (yeast under the breasts).     [provider]  Desoximetasone (TOPICORT) 0.25 % LIQD Apply 1 application topically daily as needed (skin issue).    [provider]  DULoxetine (CYMBALTA) 20 MG capsule Take 1 capsule (20 mg total) by mouth daily for 21 days. 02/02/20 02/23/20  Merwyn Katos, MD  Fluocinolone Acetonide Body 0.01 % OIL Apply to scalp 3 times a week at bedtime (Mon, Wed, Fri) , leave on over night and wash out in the am. 01/19/20   Deirdre Evener, MD  fluticasone [facility]) 50 MCG/ACT nasal spray Place 2 sprays into the nose daily as needed for allergies.     [provider]  gabapentin (NEURONTIN) 600 MG tablet Take 600 mg by mouth 3 (three) times daily.    [provider]  levothyroxine (SYNTHROID, LEVOTHROID) 175 MCG tablet Take 175 mcg by mouth daily before breakfast.    [provider]  lisinopril (PRINIVIL,ZESTRIL) 20 MG tablet Take 20 mg by mouth daily.     [provider]  lithium carbonate 300 MG capsule Take 900 mg by mouth at bedtime.     [provider]  LORazepam (ATIVAN) 2 MG tablet Take 2 mg by mouth daily.     [provider]  magnesium hydroxide (MILK OF MAGNESIA) 400 MG/5ML suspension Take 30 mLs by mouth at bedtime as  needed for moderate constipation.     [provider]  medroxyPROGESTERone (DEPO-PROVERA) 150 MG/ML injection Inject 150 mg into the muscle every 3 (three) months.     [provider]  metFORMIN (GLUCOPHAGE) 500 MG tablet Take 1,000 mg by mouth 2 (two) times daily.     [provider]  mometasone (ELOCON) 0.1 % cream Apply 1 application topically daily as needed (Itching of the ears).     [provider]  Multiple Vitamin (THEREMS PO) Take 1 tablet by mouth daily.    [provider]  Nutritional Supplements (JOINT FORMULA PO) Take 1 tablet by mouth 3 (three) times daily.    [provider]  Omega-3 1000 MG CAPS Take 1,000 mg by mouth 2 (two) times daily.    [provider]  Paliperidone Palmitate ER (INVEGA TRINZA) 819 MG/2.625ML SUSY Inject 819 mg into the muscle every 3 (three) months.    [provider]  propranolol (INDERAL) 40 MG tablet Take 40 mg by mouth 2 (two) times daily.    [provider]  QUEtiapine (SEROQUEL XR) 400 MG 24 hr tablet Take 1 tablet (400 mg total) by mouth at bedtime. 10/08/18   Mariel Craft, MD  QUEtiapine (SEROQUEL) 25 MG tablet Take 1 tablet (25 mg total) by mouth 3 (three) times daily with meals. 10/08/18   Mariel Craft, MD    Allergies Augmentin [amoxicillin-pot clavulanate]  History reviewed. No pertinent family history.  Social History Social History   Tobacco Use  . Smoking status: Never Smoker  . Smokeless tobacco: Never Used  Vaping Use  . Vaping Use: Never used  Substance Use Topics  . Alcohol use: No  . Drug use: No    Review of Systems  Constitutional: Negative for fever. Eyes: Negative for visual changes. ENT: Negative for sore throat. Cardiovascular: Negative for chest pain. Respiratory: Negative for shortness of breath. Gastrointestinal: Negative for abdominal pain, vomiting and diarrhea. Musculoskeletal: Negative for back pain. Skin: Negative for  rash.  Scalp laceration as above. Neurological: Negative for headaches, focal weakness or numbness. ____________________________________________  PHYSICAL EXAM:  VITAL SIGNS: ED Triage Vitals  Enc Vitals Group     BP 02/06/20 1727 (!) 122/53     Pulse Rate 02/06/20 1727 77     Resp 02/06/20 1727 18     Temp 02/06/20 1727 98.7 F (37.1 C)     Temp Source 02/06/20 1727 Oral     SpO2 02/06/20 1727 100 %     Weight 02/06/20 1728 268 lb 15.4 oz (122 kg)     Height 02/06/20 1728 5\' 2"  (1.575 m)     Head Circumference --      Peak Flow --      Pain Score 02/06/20 1727 4     Pain  Loc --      Pain Edu? --      Excl. in GC? --     Constitutional: Alert and oriented. Well appearing and in no distress. Head: Normocephalic and atraumatic, except for a 2 cm linear laceration in a vertical line to the occiput.  No active bleeding is appreciated.. Eyes: Conjunctivae are normal. Normal extraocular movements Neck: Supple. Normal ROM  Cardiovascular: Normal rate, regular rhythm. Normal distal pulses. Respiratory: Normal respiratory effort. No wheezes/rales/rhonchi. Musculoskeletal: Nontender with normal range of motion in all extremities.  Neurologic:  Normal gait without ataxia. Normal speech and language. No gross focal neurologic deficits are appreciated. Skin:  Skin is warm, dry and intact. No rash noted. Psychiatric: Mood and affect are normal. Patient exhibits appropriate insight and judgment. ____________________________________________  PROCEDURES  .Marland KitchenLaceration Repair  Date/Time: 02/06/2020 6:51 PM Performed by: Lissa Hoard, PA-C Authorized by: Lissa Hoard, PA-C   Consent:    Consent obtained:  Verbal   Consent given by:  Guardian   Risks discussed:  Pain Anesthesia (see MAR for exact dosages):    Anesthesia method:  Local infiltration   Local anesthetic:  Lidocaine 1% w/o epi Laceration details:    Location:  Scalp   Scalp location:  Occipital    Length (cm):  2   Depth (mm):  4 Repair type:    Repair type:  Simple Pre-procedure details:    Preparation:  Patient was prepped and draped in usual sterile fashion Treatment:    Area cleansed with:  Saline   Amount of cleaning:  Standard   Irrigation solution:  Sterile saline Skin repair:    Repair method:  Staples   Number of staples:  3 Approximation:    Approximation:  Close Post-procedure details:    Dressing:  Open (no dressing)   Patient tolerance of procedure:  Tolerated well, no immediate complications   ____________________________________________  INITIAL IMPRESSION / ASSESSMENT AND PLAN / ED COURSE  Patient presents to the ED accompanied by her group home manager, for evaluation of a mechanical fall resulting in scalp laceration. The exam is overall benign reassuring at this time. The superficial wound is closed using staples, and patient is discharged with wound care instructions. She will see her primary provider in 7 to 10 days for staple removal.  Dawn Lambert was evaluated in Emergency Department on 02/06/2020 for the symptoms described in the history of present illness. She was evaluated in the context of the global COVID-19 pandemic, which necessitated consideration that the patient might be at risk for infection with the SARS-CoV-2 virus that causes COVID-19. Institutional protocols and algorithms that pertain to the evaluation of patients at risk for COVID-19 are in a state of rapid change based on information released by regulatory bodies including the CDC and federal and state organizations. These policies and algorithms were followed during the patient's care in the ED. ____________________________________________  FINAL CLINICAL IMPRESSION(S) / ED DIAGNOSES  Final diagnoses:  Minor head injury, initial encounter  Laceration of scalp, initial encounter      Lissa Hoard, PA-C 02/06/20 1932    Delton Prairie, MD 02/06/20 2355

## 2020-02-18 ENCOUNTER — Ambulatory Visit (INDEPENDENT_AMBULATORY_CARE_PROVIDER_SITE_OTHER): Payer: Medicaid Other | Admitting: Dermatology

## 2020-02-18 ENCOUNTER — Other Ambulatory Visit: Payer: Self-pay

## 2020-02-18 DIAGNOSIS — Z872 Personal history of diseases of the skin and subcutaneous tissue: Secondary | ICD-10-CM | POA: Diagnosis not present

## 2020-02-18 DIAGNOSIS — L409 Psoriasis, unspecified: Secondary | ICD-10-CM | POA: Diagnosis not present

## 2020-02-18 MED ORDER — FLUOCINOLONE ACETONIDE BODY 0.01 % EX OIL: 1.0000 "application " | TOPICAL_OIL | CUTANEOUS | 5 refills | Status: DC

## 2020-02-18 NOTE — Progress Notes (Signed)
   Follow-Up Visit   Subjective  UNNAMED Dawn Lambert is a 29 y.o. female who presents for the following: Psoriasis (follow up of scalp - uses DermaSmoothe FS oil 3 times per week).  She has improved but still has some persistence.  Caretaker present with her and contributes to history.  The following portions of the chart were reviewed this encounter and updated as appropriate:  Tobacco  Allergies  Meds  Problems  Med Hx  Surg Hx  Fam Hx     Review of Systems:  No other skin or systemic complaints except as noted in HPI or Assessment and Plan.  Objective  Well appearing patient in no apparent distress; mood and affect are within normal limits.  A focused examination was performed including scalp. Relevant physical exam findings are noted in the Assessment and Plan.  Objective  Scalp: Moderate scale of right scalp   Assessment & Plan  Psoriasis Scalp Improved but persistent Continue Dermasmoothe FS oil once weekly or up to 3 days per week as needed for flares.  Fluocinolone Acetonide Body 0.01 % OIL - Scalp   History of Urticaria Clear today.  Return in about 6 months (around 08/18/2020).   I, Joanie Coddington, CMA, am acting as scribe for Armida Sans, MD .  Documentation: I have reviewed the above documentation for accuracy and completeness, and I agree with the above.  Armida Sans, MD

## 2020-02-18 NOTE — Patient Instructions (Addendum)
Continue Dermasmoothe FS oil once weekly or  up to 3 days per week as needed  for flares.

## 2020-02-19 ENCOUNTER — Encounter: Payer: Self-pay | Admitting: Dermatology

## 2020-03-10 ENCOUNTER — Other Ambulatory Visit: Payer: Self-pay | Admitting: Dermatology

## 2020-03-10 DIAGNOSIS — L409 Psoriasis, unspecified: Secondary | ICD-10-CM

## 2020-07-04 ENCOUNTER — Other Ambulatory Visit: Payer: Self-pay

## 2020-07-04 ENCOUNTER — Encounter: Payer: Self-pay | Admitting: Emergency Medicine

## 2020-07-04 ENCOUNTER — Emergency Department
Admission: EM | Admit: 2020-07-04 | Discharge: 2020-07-04 | Disposition: A | Discharge status: Home / Self Care | Payer: Medicaid Other | Attending: Emergency Medicine | Admitting: Emergency Medicine

## 2020-07-04 DIAGNOSIS — Z79899 Other long term (current) drug therapy: Secondary | ICD-10-CM | POA: Insufficient documentation

## 2020-07-04 DIAGNOSIS — K029 Dental caries, unspecified: Secondary | ICD-10-CM | POA: Insufficient documentation

## 2020-07-04 DIAGNOSIS — E119 Type 2 diabetes mellitus without complications: Secondary | ICD-10-CM | POA: Insufficient documentation

## 2020-07-04 DIAGNOSIS — Z7984 Long term (current) use of oral hypoglycemic drugs: Secondary | ICD-10-CM | POA: Insufficient documentation

## 2020-07-04 DIAGNOSIS — E039 Hypothyroidism, unspecified: Secondary | ICD-10-CM | POA: Insufficient documentation

## 2020-07-04 DIAGNOSIS — I10 Essential (primary) hypertension: Secondary | ICD-10-CM | POA: Diagnosis not present

## 2020-07-04 DIAGNOSIS — K0889 Other specified disorders of teeth and supporting structures: Secondary | ICD-10-CM | POA: Diagnosis present

## 2020-07-04 MED ORDER — LIDOCAINE VISCOUS HCL 2 % MT SOLN: 15.0000 mL | OROMUCOSAL | 0 refills | Status: DC | PRN

## 2020-07-04 MED ORDER — LIDOCAINE VISCOUS HCL 2 % MT SOLN
15.0000 mL | Freq: Once | OROMUCOSAL | Status: AC
  Administered 2020-07-04: 15 mL via OROMUCOSAL
  Filled 2020-07-04: qty 15

## 2020-07-04 MED ORDER — CLINDAMYCIN HCL 150 MG PO CAPS
300.0000 mg | ORAL_CAPSULE | Freq: Once | ORAL | Status: AC
  Administered 2020-07-04: 300 mg via ORAL
  Filled 2020-07-04: qty 2

## 2020-07-04 MED ORDER — CLINDAMYCIN HCL 300 MG PO CAPS: 300.0000 mg | ORAL_CAPSULE | Freq: Three times a day (TID) | ORAL | 0 refills | Status: AC

## 2020-07-04 NOTE — ED Triage Notes (Signed)
Pt via POV from [facility]. Pt c/o L sided toothache. Pt states that the tooth has been lose for the past couple of days but the pain started today. Pt states that they tried Tylenol, salt water, and Ativan with no relief. Pt is A&Ox4 and NAD. Last dose of tylenol at around 5:00pm. Pt is accompanied a care worker from the Group Home.

## 2020-07-04 NOTE — ED Notes (Signed)
Patient states "last tooth on the bottom" is loose. Caregiver is present.

## 2020-07-04 NOTE — Discharge Instructions (Signed)
Please follow up with dentist you are already established with. Take antibiotic as prescribed. You may use viscous lidocaine also prescribed for pain. Return to ER for any worsening, fevers, facial swelling.

## 2020-07-05 NOTE — ED Provider Notes (Signed)
[facility] Emergency Department Provider Note  ____________________________________________   Event Date/Time   First MD Initiated Contact with Patient 07/04/20 1923     (approximate)  I have reviewed the triage vital signs and the nursing notes.   HISTORY  Chief Complaint Dental Pain  HPI Dawn Lambert is a 30 y.o. female who reports to the emergency department for evaluation of dental pain on the left jaw.  States that the tooth has been loose and worsening pain with no relief with Tylenol, salt water or Ativan.  She does report history of dental pain in this region, is already established with a dentist locally.  Denies any fever, facial swelling or other systemic symptoms.         Past Medical History:  Diagnosis Date  . Anxiety   . Arnold-Chiari malformation (HCC)   . Atrial septal defect   . Complication of anesthesia    when pt was 11, woke up during surgery (muscle biopsy)  . Constipation   . Contraception management   . Dandruff   . Dermatomyositis (HCC)    age 57  . Diabetes mellitus without complication (HCC)   . Elevated blood pressure   . Hypertension   . Hypothyroidism   . Mental retardation   . Mood disorder (HCC)   . Overweight(278.02)   . Pelvic pain   . Prediabetes     Patient Active Problem List   Diagnosis Date Noted  . 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 Intellectual disability 10/12/2017  . Umbilical hernia without obstruction and without gangrene 08/15/2017  . Chest pain 11/05/2012  . Atrial septal defect 09/19/2011    Past Surgical History:  Procedure Laterality Date  . ASD REPAIR     at age 52  . UMBILICAL HERNIA REPAIR N/A 09/17/2017   Procedure: HERNIA REPAIR UMBILICAL ADULT;  Surgeon: Earline Mayotte, MD;  Location: [facility] ORS;  Service: General;  Laterality: N/A;    Prior to Admission medications   Medication Sig Start Date End Date Taking? Authorizing  Provider  clindamycin (CLEOCIN) 300 MG capsule Take 1 capsule (300 mg total) by mouth 3 (three) times daily for 10 days. 07/04/20 07/14/20 Yes , Ruben Gottron, PA  lidocaine (XYLOCAINE) 2 % solution Use as directed 15 mLs in the mouth or throat as needed for mouth pain. 07/04/20  Yes Lucy Chris, PA  acetaminophen (TYLENOL) 325 MG tablet Take 650 mg by mouth every 6 (six) hours as needed for mild pain, moderate pain or fever.    [provider]  atorvastatin (LIPITOR) 10 MG tablet Take 10 mg by mouth daily.     [provider]  carbamazepine (TEGRETOL) 200 MG tablet Take 400 mg by mouth 2 (two) times daily.    [provider]  cholecalciferol (VITAMIN D) 1000 units tablet Take 1,000 Units by mouth daily.     [provider]  clonazePAM (KLONOPIN) 1 MG tablet Take 1 mg by mouth 3 (three) times daily.     [provider]  clotrimazole (LOTRIMIN) 1 % cream Apply 1 application topically 2 (two) times daily as needed (yeast under the breasts).     [provider]  DERMA-SMOOTHE/FS SCALP 0.01 % OIL APPLY TOPICALLY TO SCALP ONCE A WEEK OR UP TO 3 DAYS PER WEEK AS NEEDED FOR FLARES. 03/10/20   Deirdre Evener, MD  Desoximetasone (TOPICORT) 0.25 % LIQD Apply 1 application topically daily as needed (skin issue).  [provider]  DULoxetine (CYMBALTA) 20 MG capsule Take 1 capsule (20 mg total) by mouth daily for 21 days. 02/02/20 02/23/20  Merwyn Katos, MD  fluticasone (FLONASE) 50 MCG/ACT nasal spray Place 2 sprays into the nose daily as needed for allergies.     [provider]  gabapentin (NEURONTIN) 600 MG tablet Take 600 mg by mouth 3 (three) times daily.    [provider]  levothyroxine (SYNTHROID, LEVOTHROID) 175 MCG tablet Take 175 mcg by mouth daily before breakfast.    [provider]  lisinopril (PRINIVIL,ZESTRIL) 20 MG tablet Take 20 mg by mouth daily.     [provider]  lithium  carbonate 300 MG capsule Take 900 mg by mouth at bedtime.     [provider]  LORazepam (ATIVAN) 2 MG tablet Take 2 mg by mouth daily.     [provider]  magnesium hydroxide (MILK OF MAGNESIA) 400 MG/5ML suspension Take 30 mLs by mouth at bedtime as needed for moderate constipation.     [provider]  medroxyPROGESTERone (DEPO-PROVERA) 150 MG/ML injection Inject 150 mg into the muscle every 3 (three) months.     [provider]  metFORMIN (GLUCOPHAGE) 500 MG tablet Take 1,000 mg by mouth 2 (two) times daily.     [provider]  mometasone (ELOCON) 0.1 % cream Apply 1 application topically daily as needed (Itching of the ears).     [provider]  Multiple Vitamin (THEREMS PO) Take 1 tablet by mouth daily.    [provider]  Nutritional Supplements (JOINT FORMULA PO) Take 1 tablet by mouth 3 (three) times daily.    [provider]  Omega-3 1000 MG CAPS Take 1,000 mg by mouth 2 (two) times daily.    [provider]  Paliperidone Palmitate ER (INVEGA TRINZA) 819 MG/2.625ML SUSY Inject 819 mg into the muscle every 3 (three) months.    [provider]  propranolol (INDERAL) 40 MG tablet Take 40 mg by mouth 2 (two) times daily.    [provider]  QUEtiapine (SEROQUEL XR) 400 MG 24 hr tablet Take 1 tablet (400 mg total) by mouth at bedtime. 10/08/18   Mariel Craft, MD  QUEtiapine (SEROQUEL) 25 MG tablet Take 1 tablet (25 mg total) by mouth 3 (three) times daily with meals. 10/08/18   Mariel Craft, MD    Allergies Augmentin [amoxicillin-pot clavulanate]  History reviewed. No pertinent family history.  Social History Social History   Tobacco Use  . Smoking status: Never Smoker  . Smokeless tobacco: Never Used  Vaping Use  . Vaping Use: Never used  Substance Use Topics  . Alcohol use: No  . Drug use: No    Review of Systems Constitutional: No fever/chills Eyes: No visual  changes. ENT: + Dental pain, no sore throat. Cardiovascular: Denies chest pain. Respiratory: Denies shortness of breath. Gastrointestinal: No abdominal pain.  No nausea, no vomiting.  No diarrhea.  No constipation. Genitourinary: Negative for dysuria. Musculoskeletal: Negative for back pain. Skin: Negative for rash. Neurological: Negative for headaches, focal weakness or numbness.   ____________________________________________   PHYSICAL EXAM:  VITAL SIGNS: ED Triage Vitals  Enc Vitals Group     BP 07/04/20 1835 136/86     Pulse Rate 07/04/20 1835 82     Resp 07/04/20 1835 20     Temp 07/04/20 1835 98.6 F (37 C)     Temp Source 07/04/20 1835 Oral     SpO2 07/04/20  1835 99 %     Weight 07/04/20 1834 279 lb (126.6 kg)     Height 07/04/20 1834 5\' 3"  (1.6 m)     Head Circumference --      Peak Flow --      Pain Score 07/04/20 1834 7     Pain Loc --      Pain Edu? --      Excl. in GC? --     Constitutional: Alert and oriented. Well appearing and in no acute distress. Eyes: Conjunctivae are normal. PERRL. EOMI. Head: Atraumatic. Nose: No congestion/rhinnorhea. Mouth/Throat: Mucous membranes are moist.  Oropharynx non-erythematous.  There is tenderness noted at the left posterior most lower molar, though no significant loosening is appreciated.  There is no surrounding periapical abscess, no obvious break in the tooth. Neck: No stridor.   Lymphatic: No cervical lymphadenopathy Cardiovascular: Normal rate, regular rhythm. Grossly normal heart sounds.  Good peripheral circulation. Respiratory: Normal respiratory effort.  No retractions. Lungs CTAB.  ____________________________________________   INITIAL IMPRESSION / ASSESSMENT AND PLAN / ED COURSE  As part of my medical decision making, I reviewed the following data within the electronic MEDICAL RECORD NUMBER Nursing notes reviewed and incorporated and Notes from prior ED visits        Patient is a 29 year old female who  presents to the emergency department for evaluation of left-sided tooth pain that has been bothering her for the last several days, see HPI for further details.  In triage, patient has normal vital signs.  On physical exam, there is pain at the posterior most tooth without any evidence of surrounding abscess.  We will treat patient for possible early dental infection.  She has an allergy to Augmentin, will proceed with clindamycin.  We will also provide viscous topical lidocaine for treatment of symptoms as well as encourage anti-inflammatory.  Patient and guardian are amenable this plan, patient stable this time for outpatient follow-up with dentist.      ____________________________________________   FINAL CLINICAL IMPRESSION(S) / ED DIAGNOSES  Final diagnoses:  Pain due to dental caries     ED Discharge Orders         Ordered    clindamycin (CLEOCIN) 300 MG capsule  3 times daily        07/04/20 1931    lidocaine (XYLOCAINE) 2 % solution  As needed        07/04/20 1931          *Please note:  09/03/20 was evaluated in Emergency Department on 07/05/2020 for the symptoms described in the history of present illness. She was evaluated in the context of the global COVID-19 pandemic, which necessitated consideration that the patient might be at risk for infection with the SARS-CoV-2 virus that causes COVID-19. Institutional protocols and algorithms that pertain to the evaluation of patients at risk for COVID-19 are in a state of rapid change based on information released by regulatory bodies including the CDC and federal and state organizations. These policies and algorithms were followed during the patient's care in the ED.  Some ED evaluations and interventions may be delayed as a result of limited staffing during and the pandemic.*   Note:  This document was prepared using Dragon voice recognition software and may include unintentional dictation errors.   09/04/2020,  PA 07/05/20 09/04/20    [id number], MD 07/07/20 1256

## 2020-08-18 ENCOUNTER — Other Ambulatory Visit: Payer: Self-pay

## 2020-08-18 ENCOUNTER — Ambulatory Visit (INDEPENDENT_AMBULATORY_CARE_PROVIDER_SITE_OTHER): Payer: Medicaid Other | Admitting: Dermatology

## 2020-08-18 DIAGNOSIS — L409 Psoriasis, unspecified: Secondary | ICD-10-CM

## 2020-08-18 MED ORDER — FLUOCINOLONE ACETONIDE SCALP 0.01 % EX OIL: TOPICAL_OIL | CUTANEOUS | 10 refills | Status: DC

## 2020-08-18 NOTE — Progress Notes (Signed)
   Follow-Up Visit   Subjective  Dawn Lambert is a 30 y.o. female who presents for the following: Psoriasis (6 month follow up of scalp - treating with DermaSmoothe F/S oil once weekly. She only has one spot of right scalp where she recently fell.).  Accompanied by care giver  The following portions of the chart were reviewed this encounter and updated as appropriate:   Tobacco  Allergies  Meds  Problems  Med Hx  Surg Hx  Fam Hx     Review of Systems:  No other skin or systemic complaints except as noted in HPI or Assessment and Plan.  Objective  Well appearing patient in no apparent distress; mood and affect are within normal limits.  A focused examination was performed including scalp. Relevant physical exam findings are noted in the Assessment and Plan.  Objective  Scalp: Pinkness of scalp. Slight scaliness of right mastoid area   Assessment & Plan  Psoriasis Scalp Well controlled  Psoriasis is a chronic non-curable, but treatable genetic/hereditary disease that may have other systemic features affecting other organ systems such as joints (Psoriatic Arthritis). It is associated with an increased risk of inflammatory bowel disease, heart disease, non-alcoholic fatty liver disease, and depression.    Continue Dermasmoothe F/S oil qweek or up to 3 times per week as needed for flares.  Fluocinolone Acetonide Scalp (DERMA-SMOOTHE/FS SCALP) 0.01 % OIL - Scalp  Return in about 1 year (around 08/18/2021).  I, Joanie Coddington, CMA, am acting as scribe for Armida Sans, MD .  Documentation: I have reviewed the above documentation for accuracy and completeness, and I agree with the above.  Armida Sans, MD

## 2020-08-18 NOTE — Patient Instructions (Signed)

## 2020-08-19 ENCOUNTER — Encounter: Payer: Self-pay | Admitting: Dermatology

## 2020-08-21 ENCOUNTER — Encounter: Payer: Self-pay | Admitting: Emergency Medicine

## 2020-08-21 ENCOUNTER — Emergency Department
Admission: EM | Admit: 2020-08-21 | Discharge: 2020-08-22 | Disposition: A | Discharge status: Home / Self Care | Payer: Medicaid Other | Attending: Emergency Medicine | Admitting: Emergency Medicine

## 2020-08-21 ENCOUNTER — Emergency Department: Payer: Medicaid Other

## 2020-08-21 ENCOUNTER — Other Ambulatory Visit: Payer: Self-pay

## 2020-08-21 DIAGNOSIS — Z7984 Long term (current) use of oral hypoglycemic drugs: Secondary | ICD-10-CM | POA: Insufficient documentation

## 2020-08-21 DIAGNOSIS — F4325 Adjustment disorder with mixed disturbance of emotions and conduct: Secondary | ICD-10-CM | POA: Diagnosis present

## 2020-08-21 DIAGNOSIS — E039 Hypothyroidism, unspecified: Secondary | ICD-10-CM | POA: Insufficient documentation

## 2020-08-21 DIAGNOSIS — Q211 Atrial septal defect, unspecified: Secondary | ICD-10-CM

## 2020-08-21 DIAGNOSIS — I10 Essential (primary) hypertension: Secondary | ICD-10-CM | POA: Diagnosis not present

## 2020-08-21 DIAGNOSIS — W01190A Fall on same level from slipping, tripping and stumbling with subsequent striking against furniture, initial encounter: Secondary | ICD-10-CM | POA: Insufficient documentation

## 2020-08-21 DIAGNOSIS — F79 Unspecified intellectual disabilities: Secondary | ICD-10-CM

## 2020-08-21 DIAGNOSIS — S0990XA Unspecified injury of head, initial encounter: Secondary | ICD-10-CM | POA: Diagnosis present

## 2020-08-21 DIAGNOSIS — M542 Cervicalgia: Secondary | ICD-10-CM | POA: Insufficient documentation

## 2020-08-21 DIAGNOSIS — Z79899 Other long term (current) drug therapy: Secondary | ICD-10-CM | POA: Insufficient documentation

## 2020-08-21 DIAGNOSIS — E119 Type 2 diabetes mellitus without complications: Secondary | ICD-10-CM | POA: Insufficient documentation

## 2020-08-21 DIAGNOSIS — W19XXXA Unspecified fall, initial encounter: Secondary | ICD-10-CM

## 2020-08-21 DIAGNOSIS — S0003XA Contusion of scalp, initial encounter: Secondary | ICD-10-CM | POA: Diagnosis not present

## 2020-08-21 LAB — CBC WITH DIFFERENTIAL/PLATELET
Abs Immature Granulocytes: 0.05 10*3/uL (ref 0.00–0.07)
Basophils Absolute: 0.1 10*3/uL (ref 0.0–0.1)
Basophils Relative: 1 %
Eosinophils Absolute: 0.3 10*3/uL (ref 0.0–0.5)
Eosinophils Relative: 2 %
HCT: 35.2 % — ABNORMAL LOW (ref 36.0–46.0)
Hemoglobin: 11.2 g/dL — ABNORMAL LOW (ref 12.0–15.0)
Immature Granulocytes: 1 %
Lymphocytes Relative: 18 %
Lymphs Abs: 2 10*3/uL (ref 0.7–4.0)
MCH: 28.5 pg (ref 26.0–34.0)
MCHC: 31.8 g/dL (ref 30.0–36.0)
MCV: 89.6 fL (ref 80.0–100.0)
Monocytes Absolute: 0.5 10*3/uL (ref 0.1–1.0)
Monocytes Relative: 4 %
Neutro Abs: 8.2 10*3/uL — ABNORMAL HIGH (ref 1.7–7.7)
Neutrophils Relative %: 74 %
Platelets: 310 10*3/uL (ref 150–400)
RBC: 3.93 MIL/uL (ref 3.87–5.11)
RDW: 13.2 % (ref 11.5–15.5)
WBC: 11.1 10*3/uL — ABNORMAL HIGH (ref 4.0–10.5)
nRBC: 0 % (ref 0.0–0.2)

## 2020-08-21 LAB — URINE DRUG SCREEN, QUALITATIVE (ARMC ONLY)
Amphetamines, Ur Screen: NOT DETECTED
Barbiturates, Ur Screen: NOT DETECTED
Benzodiazepine, Ur Scrn: POSITIVE — AB
Cannabinoid 50 Ng, Ur ~~LOC~~: NOT DETECTED
Cocaine Metabolite,Ur ~~LOC~~: NOT DETECTED
MDMA (Ecstasy)Ur Screen: NOT DETECTED
Methadone Scn, Ur: NOT DETECTED
Opiate, Ur Screen: NOT DETECTED
Phencyclidine (PCP) Ur S: NOT DETECTED
Tricyclic, Ur Screen: POSITIVE — AB

## 2020-08-21 LAB — COMPREHENSIVE METABOLIC PANEL
ALT: 31 U/L (ref 0–44)
AST: 34 U/L (ref 15–41)
Albumin: 4 g/dL (ref 3.5–5.0)
Alkaline Phosphatase: 68 U/L (ref 38–126)
Anion gap: 13 (ref 5–15)
BUN: 8 mg/dL (ref 6–20)
CO2: 22 mmol/L (ref 22–32)
Calcium: 9.4 mg/dL (ref 8.9–10.3)
Chloride: 102 mmol/L (ref 98–111)
Creatinine, Ser: 0.8 mg/dL (ref 0.44–1.00)
GFR, Estimated: >60 mL/min (ref >60)
Glucose, Bld: 129 mg/dL — ABNORMAL HIGH (ref 70–99)
Potassium: 4 mmol/L (ref 3.5–5.1)
Sodium: 137 mmol/L (ref 135–145)
Total Bilirubin: 0.5 mg/dL (ref 0.3–1.2)
Total Protein: 7.7 g/dL (ref 6.5–8.1)

## 2020-08-21 LAB — URINALYSIS, COMPLETE (UACMP) WITH MICROSCOPIC
Bilirubin Urine: NEGATIVE
Glucose, UA: NEGATIVE mg/dL
Ketones, ur: NEGATIVE mg/dL
Nitrite: NEGATIVE
Protein, ur: NEGATIVE mg/dL
Specific Gravity, Urine: 1.006 (ref 1.005–1.030)
pH: 7 (ref 5.0–8.0)

## 2020-08-21 LAB — ETHANOL: Alcohol, Ethyl (B): <10 mg/dL (ref <10)

## 2020-08-21 LAB — SALICYLATE LEVEL: Salicylate Lvl: <7 mg/dL — ABNORMAL LOW (ref 7.0–30.0)

## 2020-08-21 LAB — ACETAMINOPHEN LEVEL: Acetaminophen (Tylenol), Serum: <10 ug/mL — ABNORMAL LOW (ref 10–30)

## 2020-08-21 LAB — PREGNANCY, URINE: Preg Test, Ur: NEGATIVE

## 2020-08-21 NOTE — ED Notes (Signed)
Report given to [employer] in [place]. Pt to move over soon.

## 2020-08-21 NOTE — ED Notes (Signed)
Pt given lunch tray that dietary dropped off.  

## 2020-08-21 NOTE — ED Notes (Addendum)
Called dietary to order pt a lunch tray. State they will send her one soon. Pt offered snacks for now. Dietary notified this pt psych and will need tray without utensils. Dietary staff stated verbal understanding.

## 2020-08-21 NOTE — ED Notes (Signed)
Hourly rounding reveals patient in room. No complaints, stable, in no acute distress. Q15 minute rounds and monitoring via Security Cameras to continue. 

## 2020-08-21 NOTE — ED Notes (Signed)
EDP Katrinka Blazing verbal that he will place involuntary psych hold on pt until she is evaluated by psych staff. Notified Diplomatic Services operational officer in person.

## 2020-08-21 NOTE — ED Notes (Signed)
Pt reports "SI". States she would "dig into my (her) head"; pt then started scratching lightly at the top of her head. Pt denies HI. Pt calm and cooperative. Sitting in bed. Pt requseting food stating that she did not have lunch.

## 2020-08-21 NOTE — ED Notes (Signed)
Called lab to add on urine preg. Michelle in lab states she'll do it now.

## 2020-08-21 NOTE — Discharge Instructions (Addendum)
Follow-up with your primary care provider if any continued problems or concerns.  Watch the abrasion on your scalp for any signs of infection.  Clean daily with mild soap and water.  You may continue taking Tylenol as needed for headache.  Your CT of your head and neck do not show any fractures or bleeding in your brain.  Your symptoms should continue to get better each day.

## 2020-08-21 NOTE — ED Notes (Signed)
Report to include situation, background, assessment and recommendations from Amber RN. Patient sleeping, respirations regular and unlabored. Q15 minute rounds and security camera observation to continue.    

## 2020-08-21 NOTE — Consult Note (Signed)
[facility] Face-to-Face Psychiatry Consult   Reason for Consult: Fall and head injury Referring Physician: Rise Paganini Patient Identification: Dawn Lambert MRN:  [id number] Principal Diagnosis: <principal problem not specified> Diagnosis:  Active Problems:   Atrial septal defect   Adjustment disorder with mixed disturbance of emotions and conduct   Intellectual disability   Total Time spent with patient: 30 minutes  Subjective: "I am not suicidal.  I did it to get attention.  You can never get too much attention." Dawn Lambert is a 30 y.o. female patient presented to [facility] ED via POV with caregiver from her group home. The patient was initially voluntary, and after voicing suicidal ideation, she was placed under involuntary commitment status (IVC). The patient expressed, "I am not suicidal. I want to get out of here and go home."  During the patient assessment, the patient said "I to them that I was suicidal because I wanted to be in the hospital." The patient was asked why? "I like the attention I get when I am here. Everybody likes getting attention. You can never get too much of it."  The patient states that she likes her group home and wants to go back. The patient was seen face-to-face by this provider; the chart was reviewed and consulted with Dr. Fuller Plan on 08/21/2020 due to the patient's care. It was discussed with the EDP that the patient does not meet the criteria to be admitted to the psychiatric inpatient unit. Continue to discuss with the EDP; the patient lives in a group home and should be monitored by staff. The patient discussed that she is well monitored at her group home, and if she does voice suicidal ideation, she will be brought back to the ED. On evaluation, the patient is alert and oriented x 4, calm, cooperative, and mood-congruent with affect. The patient does not appear to be responding to internal or external stimuli. Neither is the patient presenting with any delusional  thinking. The patient denies auditory or visual hallucinations. The patient denies any suicidal, homicidal, or self-harm ideations. The patient is not presenting with any psychotic or paranoid behaviors.  Psychiatric disposition: The patient does not meet the criteria to be admitted to the psychiatric inpatient unit. The patient is psychiatrically clear.  HPI: Per Levada Schilling, [place]; Dawn Lambert is a 30 y.o. female presents to the ED with caregiver reporting a fall for 2 - 5 times that happened last Friday( 7 days ago)  in which patient fell backwards hitting her head on her dresser and continues to have a headache.  Patient states that her vision is blurry but denies any nausea, vomiting or known LOC.  Patient has a history of MR however caregiver is present and agrees with history given.  Patient rates her pain as 7 out of 10.  Past Psychiatric History:  Anxiety Mental retardation Mood disorder (HCC)  Risk to Self:   Risk to Others:   Prior Inpatient Therapy:   Prior Outpatient Therapy:    Past Medical History:  Past Medical History:  Diagnosis Date  . Anxiety   . Arnold-Chiari malformation (HCC)   . Atrial septal defect   . Complication of anesthesia    when pt was 11, woke up during surgery (muscle biopsy)  . Constipation   . Contraception management   . Dandruff   . Dermatomyositis (HCC)    age 78  . Diabetes mellitus without complication (HCC)   . Elevated blood pressure   . Hypertension   .  Hypothyroidism   . Mental retardation   . Mood disorder (HCC)   . Overweight(278.02)   . Pelvic pain   . Prediabetes     Past Surgical History:  Procedure Laterality Date  . ASD REPAIR     at age 61  . UMBILICAL HERNIA REPAIR N/A 09/17/2017   Procedure: HERNIA REPAIR UMBILICAL ADULT;  Surgeon: Earline Mayotte, MD;  Location: [facility] ORS;  Service: General;  Laterality: N/A;   Family History: No family history on file. Family Psychiatric  History:  Social History:  Social History    Substance and Sexual Activity  Alcohol Use No     Social History   Substance and Sexual Activity  Drug Use No    Social History   Socioeconomic History  . Marital status: Single    Spouse name: Not on file  . Number of children: Not on file  . Years of education: Not on file  . Highest education level: Not on file  Occupational History  . Not on file  Tobacco Use  . Smoking status: Never Smoker  . Smokeless tobacco: Never Used  Vaping Use  . Vaping Use: Never used  Substance and Sexual Activity  . Alcohol use: No  . Drug use: No  . Sexual activity: Not on file  Other Topics Concern  . Not on file  Social History Narrative  . Not on file   Social Determinants of Health   Financial Resource Strain: Not on file  Food Insecurity: Not on file  Transportation Needs: Not on file  Physical Activity: Not on file  Stress: Not on file  Social Connections: Not on file   Additional Social History:    Allergies:   Allergies  Allergen Reactions  . Augmentin [Amoxicillin-Pot Clavulanate] Other (See Comments)    Has patient had a PCN reaction causing immediate rash, facial/tongue/throat swelling, SOB or lightheadedness with hypotension: ______ Has patient had a PCN reaction causing severe rash involving mucus membranes or skin necrosis: _______ Has patient had a PCN reaction that required hospitalization: _______ Has patient had a PCN reaction occurring within the last 10 years:_______ If all of the above answers are "NO", then may proceed with Cephalosporin use.     Labs:  Results for orders placed or performed during the hospital encounter of 08/21/20 (from the past 48 hour(s))  CBC with Differential     Status: Abnormal   Collection Time: 08/21/20 12:44 PM  Result Value Ref Range   WBC 11.1 (H) 4.0 - 10.5 K/uL   RBC 3.93 3.87 - 5.11 MIL/uL   Hemoglobin 11.2 (L) 12.0 - 15.0 g/dL   HCT 73.5 (L) 32.9 - 92.4 %   MCV 89.6 80.0 - 100.0 fL   MCH 28.5 26.0 - 34.0 pg    MCHC 31.8 30.0 - 36.0 g/dL   RDW 26.8 34.1 - 96.2 %   Platelets 310 150 - 400 K/uL   nRBC 0.0 0.0 - 0.2 %   Neutrophils Relative % 74 %   Neutro Abs 8.2 (H) 1.7 - 7.7 K/uL   Lymphocytes Relative 18 %   Lymphs Abs 2.0 0.7 - 4.0 K/uL   Monocytes Relative 4 %   Monocytes Absolute 0.5 0.1 - 1.0 K/uL   Eosinophils Relative 2 %   Eosinophils Absolute 0.3 0.0 - 0.5 K/uL   Basophils Relative 1 %   Basophils Absolute 0.1 0.0 - 0.1 K/uL   Immature Granulocytes 1 %   Abs Immature Granulocytes 0.05 0.00 - 0.07  K/uL    Comment: Performed at [facility], [address] [place], [place] [address]  Comprehensive metabolic panel     Status: Abnormal   Collection Time: 08/21/20 12:44 PM  Result Value Ref Range   Sodium 137 135 - 145 mmol/L   Potassium 4.0 3.5 - 5.1 mmol/L   Chloride 102 98 - 111 mmol/L   CO2 22 22 - 32 mmol/L   Glucose, Bld 129 (H) 70 - 99 mg/dL    Comment: Glucose reference range applies only to samples taken after fasting for at least 8 hours.   BUN 8 6 - 20 mg/dL   Creatinine, Ser 6.040.80 0.44 - 1.00 mg/dL   Calcium 9.4 8.9 - 54.010.3 mg/dL   Total Protein 7.7 6.5 - 8.1 g/dL   Albumin 4.0 3.5 - 5.0 g/dL   AST 34 15 - 41 U/L   ALT 31 0 - 44 U/L   Alkaline Phosphatase 68 38 - 126 U/L   Total Bilirubin 0.5 0.3 - 1.2 mg/dL   GFR, Estimated >98>60 >11>60 mL/min    Comment: (NOTE) Calculated using the CKD-EPI Creatinine Equation (2021)    Anion gap 13 5 - 15    Comment: Performed at [facility], [address] [place], [place] [address]  Acetaminophen level     Status: Abnormal   Collection Time: 08/21/20 12:44 PM  Result Value Ref Range   Acetaminophen (Tylenol), Serum <10 (L) 10 - 30 ug/mL    Comment: (NOTE) Therapeutic concentrations vary significantly. A range of 10-30 ug/mL  may be an effective concentration for many patients. However, some  are best treated at concentrations outside of this range. Acetaminophen concentrations >150 ug/mL at 4 hours after  ingestion  and >50 ug/mL at 12 hours after ingestion are often associated with  toxic reactions.  Performed at [facility], [address] [place], [place] [address]   Ethanol     Status: None   Collection Time: 08/21/20 12:44 PM  Result Value Ref Range   Alcohol, Ethyl (B) <10 <10 mg/dL    Comment: (NOTE) Lowest detectable limit for serum alcohol is 10 mg/dL.  For medical purposes only. Performed at [facility], [address] [place], [place] [address]   Salicylate level     Status: Abnormal   Collection Time: 08/21/20 12:44 PM  Result Value Ref Range   Salicylate Lvl <7.0 (L) 7.0 - 30.0 mg/dL    Comment: Performed at [facility], [address] [place], [place] [address]  Urinalysis, Complete w Microscopic Urine, Clean Catch     Status: Abnormal   Collection Time: 08/21/20 12:44 PM  Result Value Ref Range   Color, Urine YELLOW (A) YELLOW   APPearance CLEAR (A) CLEAR   Specific Gravity, Urine 1.006 1.005 - 1.030   pH 7.0 5.0 - 8.0   Glucose, UA NEGATIVE NEGATIVE mg/dL   Hgb urine dipstick SMALL (A) NEGATIVE   Bilirubin Urine NEGATIVE NEGATIVE   Ketones, ur NEGATIVE NEGATIVE mg/dL   Protein, ur NEGATIVE NEGATIVE mg/dL   Nitrite NEGATIVE NEGATIVE   Leukocytes,Ua SMALL (A) NEGATIVE   RBC / HPF 0-5 0 - 5 RBC/hpf   WBC, UA 6-10 0 - 5 WBC/hpf   Bacteria, UA RARE (A) NONE SEEN   Squamous Epithelial / LPF 0-5 0 - 5   Mucus PRESENT     Comment: Performed at [facility], [address] [place], [place] [address]  Urine Drug Screen, Qualitative  Status: Abnormal   Collection Time: 08/21/20 12:44 PM  Result Value Ref Range   Tricyclic, Ur Screen POSITIVE (A) NONE DETECTED   Amphetamines, Ur Screen NONE DETECTED NONE DETECTED   MDMA (Ecstasy)Ur Screen NONE DETECTED NONE DETECTED   Cocaine Metabolite,Ur [place] NONE DETECTED NONE DETECTED   Opiate, Ur Screen NONE DETECTED NONE DETECTED   Phencyclidine (PCP) Ur S NONE DETECTED NONE  DETECTED   Cannabinoid 50 Ng, Ur [place] NONE DETECTED NONE DETECTED   Barbiturates, Ur Screen NONE DETECTED NONE DETECTED   Benzodiazepine, Ur Scrn POSITIVE (A) NONE DETECTED   Methadone Scn, Ur NONE DETECTED NONE DETECTED    Comment: (NOTE) Tricyclics + metabolites, urine    Cutoff 1000 ng/mL Amphetamines + metabolites, urine  Cutoff 1000 ng/mL MDMA (Ecstasy), urine              Cutoff 500 ng/mL Cocaine Metabolite, urine          Cutoff 300 ng/mL Opiate + metabolites, urine        Cutoff 300 ng/mL Phencyclidine (PCP), urine         Cutoff 25 ng/mL Cannabinoid, urine                 Cutoff 50 ng/mL Barbiturates + metabolites, urine  Cutoff 200 ng/mL Benzodiazepine, urine              Cutoff 200 ng/mL Methadone, urine                   Cutoff 300 ng/mL  The urine drug screen provides only a preliminary, unconfirmed analytical test result and should not be used for non-medical purposes. Clinical consideration and professional judgment should be applied to any positive drug screen result due to possible interfering substances. A more specific alternate chemical method must be used in order to obtain a confirmed analytical result. Gas chromatography / mass spectrometry (GC/MS) is the preferred confirm atory method. Performed at [facility], [address] [place], [name] [address]   Pregnancy, urine     Status: None   Collection Time: 08/21/20 12:44 PM  Result Value Ref Range   Preg Test, Ur NEGATIVE NEGATIVE    Comment: Performed at [facility], [address] [place], [name] [address]    No current facility-administered medications for this encounter.   Current Outpatient Medications  Medication Sig Dispense Refill  . acetaminophen (TYLENOL) 325 MG tablet Take 650 mg by mouth every 6 (six) hours as needed for mild pain, moderate pain or fever.    [name] atorvastatin (LIPITOR) 10 MG tablet Take 10 mg by mouth daily.     . carbamazepine (TEGRETOL) 200 MG tablet  Take 400 mg by mouth 2 (two) times daily.    . cholecalciferol (VITAMIN D) 1000 units tablet Take 1,000 Units by mouth daily.     . clonazePAM (KLONOPIN) 1 MG tablet Take 1 mg by mouth 3 (three) times daily.     [name] gabapentin (NEURONTIN) 600 MG tablet Take 600 mg by mouth 3 (three) times daily.    [name] levothyroxine (SYNTHROID, LEVOTHROID) 175 MCG tablet Take 175 mcg by mouth daily before breakfast.    . lisinopril (PRINIVIL,ZESTRIL) 20 MG tablet Take 20 mg by mouth daily.     [name] LORazepam (ATIVAN) 2 MG tablet Take 2 mg by mouth daily.    . metFORMIN (GLUCOPHAGE) 500 MG tablet Take 1,000 mg by mouth 2 (two) times daily.     . propranolol (INDERAL) 40 MG tablet  Take 40 mg by mouth 2 (two) times daily.    . QUEtiapine (SEROQUEL XR) 400 MG 24 hr tablet Take 1 tablet (400 mg total) by mouth at bedtime. 60 tablet 2  . QUEtiapine (SEROQUEL) 25 MG tablet Take 1 tablet (25 mg total) by mouth 3 (three) times daily with meals. 90 tablet 2  . clotrimazole (LOTRIMIN) 1 % cream Apply 1 application topically 2 (two) times daily as needed (yeast under the breasts).     . Desoximetasone 0.25 % LIQD Apply 1 application topically daily as needed (skin issue).    . DULoxetine (CYMBALTA) 20 MG capsule Take 1 capsule (20 mg total) by mouth daily for 21 days. 21 capsule 0  . Fluocinolone Acetonide Scalp (DERMA-SMOOTHE/FS SCALP) 0.01 % OIL APPLY TOPICALLY TO SCALP ONCE A WEEK OR UP TO 3 DAYS PER WEEK AS NEEDED FOR FLARES. 118.28 mL 10  . fluticasone (FLONASE) 50 MCG/ACT nasal spray Place 2 sprays into the nose daily as needed for allergies.     [name] lidocaine (XYLOCAINE) 2 % solution Use as directed 15 mLs in the mouth or throat as needed for mouth pain. 100 mL 0  . lithium carbonate 300 MG capsule Take 900 mg by mouth at bedtime.  (Patient not taking: No sig reported)    . magnesium hydroxide (MILK OF MAGNESIA) 400 MG/5ML suspension Take 30 mLs by mouth at bedtime as needed for moderate constipation.  (Patient not taking: No  sig reported)    . medroxyPROGESTERone (DEPO-PROVERA) 150 MG/ML injection Inject 150 mg into the muscle every 3 (three) months.     . mometasone (ELOCON) 0.1 % cream Apply 1 application topically daily as needed (Itching of the ears).  (Patient not taking: No sig reported)    . Multiple Vitamin (THEREMS PO) Take 1 tablet by mouth daily. (Patient not taking: No sig reported)    . Omega-3 1000 MG CAPS Take 1,000 mg by mouth 2 (two) times daily. (Patient not taking: No sig reported)    . Paliperidone Palmitate ER (INVEGA TRINZA) 819 MG/2.625ML injection Inject 819 mg into the muscle every 3 (three) months. (Patient not taking: No sig reported)      Musculoskeletal: Strength & Muscle Tone: within normal limits Gait & Station: normal Patient leans: N/A  Psychiatric Specialty Exam:  Presentation  General Appearance: Appropriate for Environment  Eye Contact:Good  Speech:Clear and Coherent  Speech Volume:Normal  Handedness:Right   Mood and Affect  Mood:Euthymic  Affect:Appropriate; Congruent   Thought Process  Thought Processes:Coherent  Descriptions of Associations:Intact  Orientation:Full (Time, Place and Person)  Thought Content:Logical  History of Schizophrenia/Schizoaffective disorder:No data recorded Duration of Psychotic Symptoms:No data recorded Hallucinations:Hallucinations: None  Ideas of Reference:None  Suicidal Thoughts:Suicidal Thoughts: No  Homicidal Thoughts:Homicidal Thoughts: No   Sensorium  Memory:Immediate Good; Recent Good; Remote Good  Judgment:Good  Insight:Good   Executive Functions  Concentration:Good  Attention Span:Good  Recall:Good  Fund of Knowledge:Good  Language:Good   Psychomotor Activity  Psychomotor Activity:Psychomotor Activity: Normal   Assets  Assets:Communication Skills; Desire for Improvement; Resilience; Social Support   Sleep  Sleep:Sleep: Good   Physical Exam: Physical Exam Vitals and nursing note  reviewed.  Constitutional:      Appearance: Normal appearance. She is obese.  HENT:     Nose: Nose normal.     Mouth/Throat:     Mouth: Mucous membranes are moist.  Cardiovascular:     Pulses: Normal pulses.  Pulmonary:     Effort: Pulmonary effort is normal.  Musculoskeletal:        General: Normal range of motion.     Cervical back: Normal range of motion and neck supple.  Neurological:     General: No focal deficit present.     Mental Status: She is alert and oriented to person, place, and time.  Psychiatric:        Attention and Perception: Attention and perception normal.        Mood and Affect: Mood and affect normal.        Speech: Speech normal.        Behavior: Behavior normal. Behavior is cooperative.        Thought Content: Thought content normal.        Cognition and Memory: Cognition and memory normal.        Judgment: Judgment normal.    Review of Systems  Psychiatric/Behavioral: The patient is nervous/anxious.   All other systems reviewed and are negative.  Blood pressure 134/72, pulse 82, temperature 98.1 F (36.7 C), temperature source Oral, resp. rate 18, SpO2 98 %. There is no height or weight on file to calculate BMI.  Treatment Plan Summary: Medication management and Plan The patient is not a safety risk to herself or others and does not require psychiatric inpatient admission.   Disposition: No evidence of imminent risk to self or others at present.   Supportive therapy provided about ongoing stressors. Refer to IOP. Discussed crisis plan, support from social network, calling 911, coming to the Emergency Department, and calling Suicide Hotline.  Gillermo Murdoch, NP 08/21/2020 10:48 PM

## 2020-08-21 NOTE — ED Notes (Signed)
Trash in pt's room from lunch removed.

## 2020-08-21 NOTE — ED Triage Notes (Signed)
Caregiver reports pt fell last Friday and hit the back of her head and needs to be checked out. No LOC

## 2020-08-21 NOTE — ED Notes (Signed)
IVC,pt moved to [facility] 3

## 2020-08-21 NOTE — BH Assessment (Signed)
Comprehensive Clinical Assessment (CCA) Screening, Triage and Referral Note  08/21/2020 Dawn Lambert [id number]  Chief Complaint:  Chief Complaint  Patient presents with  . Fall  . Head Injury   Visit Diagnosis: Mental Retardation  Patient Reported Information How did you hear about [name]? Self   Referral name: Self   Referral phone number: No data recorded Whom do you see for routine medical problems? No data recorded  Practice/Facility Name: No data recorded  Practice/Facility Phone Number: No data recorded  Name of Contact: No data recorded  Contact Number: No data recorded  Contact Fax Number: No data recorded  Prescriber Name: No data recorded  Prescriber Address (if known): No data recorded What Is the Reason for Your Visit/Call Today? Came to the ER due to hitting her head. Upon d/c she voiced SI.  How Long Has This Been Causing You Problems? <Week  Have You Recently Been in Any Inpatient Treatment (Hospital/Detox/Crisis Center/28-Day Program)? No   Name/Location of Program/Hospital:No data recorded  How Long Were You There? No data recorded  When Were You Discharged? No data recorded Have You Ever Received Services From [facility] Before? Yes   Who Do You See at [facility]? Mental Health and Medical Treatment  Have You Recently Had Any Thoughts About Hurting Yourself? Yes   Are You Planning to Commit Suicide/Harm Yourself At This time?  No  Have you Recently Had Thoughts About Hurting Someone Dawn Lambert? No   Explanation: No data recorded Have You Used Any Alcohol or Drugs in the Past 24 Hours? No   How Long Ago Did You Use Drugs or Alcohol?  No data recorded  What Did You Use and How Much? No data recorded What Do You Feel Would Help You the Most Today? Treatment for Depression or other mood problem  Do You Currently Have a Therapist/Psychiatrist? No   Name of Therapist/Psychiatrist: No data recorded  Have You Been Recently Discharged From Any Office  Practice or Programs? No   Explanation of Discharge From Practice/Program:  No data recorded    CCA Screening Triage Referral Assessment Type of Contact: Face-to-Face   Is this Initial or Reassessment? No data recorded  Date Telepsych consult ordered in [facility]:  02/01/2020   Time Telepsych consult ordered in [facility]:  1555  Patient Reported Information Reviewed? Yes   Patient Left Without Being Seen? No data recorded  Reason for Not Completing Assessment: No data recorded Collateral Involvement: No data recorded Does Patient Have a Court Appointed Legal Guardian? No data recorded  Name and Contact of Legal Guardian:  Dawn Lambert [phone]  If Minor and Not Living with Parent(s), Who has Custody? n/a  Is CPS involved or ever been involved? Never  Is APS involved or ever been involved? Never  Patient Determined To Be At Risk for Harm To Self or Others Based on Review of Patient Reported Information or Presenting Complaint? No   Method: No data recorded  Availability of Means: No data recorded  Intent: No data recorded  Notification Required: No data recorded  Additional Information for Danger to Others Potential:  No data recorded  Additional Comments for Danger to Others Potential:  No data recorded  Are There Guns or Other Weapons in Your Home?  No data recorded   Types of Guns/Weapons: No data recorded   Are These Weapons Safely Secured?  No data recorded   Who Could Verify You Are Able To Have These Secured:    No data recorded Do You Have any Outstanding Charges, Pending Court Dates, Parole/Probation? No data recorded Contacted To Inform of Risk of Harm To Self or Others: No data recorded Location of Assessment: [facility] ED  Does Patient Present under Involuntary Commitment? Yes   IVC Papers Initial File Date: 08/21/2020   [place] of Residence: [place]  Patient Currently Receiving the Following Services: Medication Management   Determination  of Need: Emergent (2 hours)   Options For Referral: Outpatient Therapy  Dawn Gilford MS, LCAS, [facility], [facility] Therapeutic Triage Specialist 08/21/2020 4:47 PM

## 2020-08-21 NOTE — ED Notes (Signed)
Pt transported to CT at this time.

## 2020-08-21 NOTE — ED Notes (Signed)
Received report from [facility]. Pt to be dressed out and blood drawn in [facility] before switching to room 23 in [place] per [employer].

## 2020-08-21 NOTE — ED Notes (Signed)
Pt states increase in falls against the wall and falling out of bed. States head pain on right side with headache since fall on Friday. States it's from her legs. NO dizziness.

## 2020-08-21 NOTE — ED Notes (Signed)
Heather, student RN went in to d/c pt and pt states that she wants to stand naked in traffic and kill herself. Caregiver remains at bedside. PA and Charge RN aware.

## 2020-08-21 NOTE — ED Notes (Signed)
Security Animator notified pt to move to [employer] soon.

## 2020-08-21 NOTE — ED Notes (Signed)
Patient is calm and cooperative. Awake, alert and oriented X3. She denies SI, HI, AVH and pain. She said she fall at the group home to get attention and not to hurt herself. She requested her HS medications and wants to go back to her group home. No issues.

## 2020-08-21 NOTE — ED Notes (Signed)
Pt alert, calm, cooperative. Medic and officer/security present.

## 2020-08-21 NOTE — ED Notes (Signed)
Calvin from psych in ED. Received report; will see pt soon.

## 2020-08-21 NOTE — ED Provider Notes (Signed)
[facility] Emergency Department Provider Note   ____________________________________________   Event Date/Time   First MD Initiated Contact with Patient 08/21/20 1023     (approximate)  I have reviewed the triage vital signs and the nursing notes.   HISTORY  Chief Complaint Fall and Head Injury    HPI Dawn Lambert is a 30 y.o. female presents to the ED with caregiver reporting a fall for 2 - 5 times that happened last Friday( 7 days ago)  in which patient fell backwards hitting her head on her dresser and continues to have a headache.  Patient states that her vision is blurry but denies any nausea, vomiting or known LOC.  Patient has a history of MR however caregiver is present and agrees with history given.  Patient rates her pain as 7 out of 10.       Past Medical History:  Diagnosis Date  . Anxiety   . Arnold-Chiari malformation (HCC)   . Atrial septal defect   . Complication of anesthesia    when pt was 11, woke up during surgery (muscle biopsy)  . Constipation   . Contraception management   . Dandruff   . Dermatomyositis (HCC)    age 67  . Diabetes mellitus without complication (HCC)   . Elevated blood pressure   . Hypertension   . Hypothyroidism   . Mental retardation   . Mood disorder (HCC)   . Overweight(278.02)   . Pelvic pain   . Prediabetes     Patient Active Problem List   Diagnosis Date Noted  . 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 Intellectual disability 10/12/2017  . Umbilical hernia without obstruction and without gangrene 08/15/2017  . Chest pain 11/05/2012  . Atrial septal defect 09/19/2011    Past Surgical History:  Procedure Laterality Date  . ASD REPAIR     at age 14  . UMBILICAL HERNIA REPAIR N/A 09/17/2017   Procedure: HERNIA REPAIR UMBILICAL ADULT;  Surgeon: Earline Mayotte, MD;  Location: [facility] ORS;  Service: General;  Laterality: N/A;    Prior to  Admission medications   Medication Sig Start Date End Date Taking? Authorizing Provider  acetaminophen (TYLENOL) 325 MG tablet Take 650 mg by mouth every 6 (six) hours as needed for mild pain, moderate pain or fever.    [provider]  atorvastatin (LIPITOR) 10 MG tablet Take 10 mg by mouth daily.     [provider]  carbamazepine (TEGRETOL) 200 MG tablet Take 400 mg by mouth 2 (two) times daily.    [provider]  cholecalciferol (VITAMIN D) 1000 units tablet Take 1,000 Units by mouth daily.     [provider]  clonazePAM (KLONOPIN) 1 MG tablet Take 1 mg by mouth 3 (three) times daily.     [provider]  clotrimazole (LOTRIMIN) 1 % cream Apply 1 application topically 2 (two) times daily as needed (yeast under the breasts).     [provider]  Desoximetasone (TOPICORT) 0.25 % LIQD Apply 1 application topically daily as needed (skin issue).    [provider]  DULoxetine (CYMBALTA) 20 MG capsule Take 1 capsule (20 mg total) by mouth daily for 21 days. 02/02/20 02/23/20  Merwyn Katos, MD  Fluocinolone Acetonide Scalp (DERMA-SMOOTHE/FS SCALP) 0.01 % OIL APPLY TOPICALLY TO SCALP ONCE A WEEK OR UP TO 3 DAYS PER WEEK AS NEEDED FOR FLARES. 08/18/20   Deirdre Evener, MD  fluticasone (FLONASE) 50 MCG/ACT nasal spray Place 2 sprays into the nose daily as needed for allergies.     [provider]  gabapentin (NEURONTIN) 600 MG tablet Take 600 mg by mouth 3 (three) times daily.    [provider]  levothyroxine (SYNTHROID, LEVOTHROID) 175 MCG tablet Take 175 mcg by mouth daily before breakfast.    [provider]  lidocaine (XYLOCAINE) 2 % solution Use as directed 15 mLs in the mouth or throat as needed for mouth pain. 07/04/20   Lucy Chris, PA  lisinopril (PRINIVIL,ZESTRIL) 20 MG tablet Take 20 mg by mouth daily.     [provider]  lithium carbonate 300 MG capsule Take 900 mg by mouth at  bedtime.     [provider]  LORazepam (ATIVAN) 2 MG tablet Take 2 mg by mouth daily.     [provider]  magnesium hydroxide (MILK OF MAGNESIA) 400 MG/5ML suspension Take 30 mLs by mouth at bedtime as needed for moderate constipation.     [provider]  medroxyPROGESTERone (DEPO-PROVERA) 150 MG/ML injection Inject 150 mg into the muscle every 3 (three) months.     [provider]  metFORMIN (GLUCOPHAGE) 500 MG tablet Take 1,000 mg by mouth 2 (two) times daily.     [provider]  mometasone (ELOCON) 0.1 % cream Apply 1 application topically daily as needed (Itching of the ears).     [provider]  Multiple Vitamin (THEREMS PO) Take 1 tablet by mouth daily.    [provider]  Nutritional Supplements (JOINT FORMULA PO) Take 1 tablet by mouth 3 (three) times daily.    [provider]  Omega-3 1000 MG CAPS Take 1,000 mg by mouth 2 (two) times daily.    [provider]  Paliperidone Palmitate ER (INVEGA TRINZA) 819 MG/2.625ML SUSY Inject 819 mg into the muscle every 3 (three) months.    [provider]  propranolol (INDERAL) 40 MG tablet Take 40 mg by mouth 2 (two) times daily.    [provider]  QUEtiapine (SEROQUEL XR) 400 MG 24 hr tablet Take 1 tablet (400 mg total) by mouth at bedtime. 10/08/18   Mariel Craft, MD  QUEtiapine (SEROQUEL) 25 MG tablet Take 1 tablet (25 mg total) by mouth 3 (three) times daily with meals. 10/08/18   Mariel Craft, MD    Allergies Augmentin [amoxicillin-pot clavulanate]  No family history on file.  Social History Social History   Tobacco Use  . Smoking status: Never Smoker  . Smokeless tobacco: Never Used  Vaping Use  . Vaping Use: Never used  Substance Use Topics  . Alcohol use: No  . Drug use: No    Review of Systems Constitutional: No fever/chills Eyes: As of blurred vision. ENT: No sore throat. Cardiovascular: Denies chest  pain. Respiratory: Denies shortness of breath. Gastrointestinal: No abdominal pain.  No nausea, no vomiting.  No diarrhea.   Genitourinary: Negative for dysuria. Musculoskeletal: Negative for back pain.  Positive for cervical pain. Skin: Positive for abrasion scalp. Neurological: Alert and for headaches, no focal weakness or numbness. Psychological: History of mental retardation ____________________________________________   PHYSICAL EXAM:  VITAL SIGNS: ED Triage Vitals [08/21/20 1018]  Enc Vitals Group     BP      Pulse      Resp      Temp      Temp src      SpO2      Weight  Height      Head Circumference      Peak Flow      Pain Score 7     Pain Loc      Pain Edu?      Excl. in GC?     Constitutional: Alert and oriented. Well appearing and in no acute distress.  Patient is cooperative, talkative and answers questions appropriately. Eyes: Conjunctivae are normal. PERRL. EOMI. Head: Atraumatic. Nose: No trauma. Mouth/Throat: No trauma. Neck: No stridor.  There is some mild midline tenderness on palpation of cervical spine posteriorly. Cardiovascular: Normal rate, regular rhythm. Grossly normal heart sounds.  Good peripheral circulation. Respiratory: Normal respiratory effort.  No retractions. Lungs CTAB. Gastrointestinal: Soft and nontender. No distention. Musculoskeletal: Nontender thoracic or lumbar spine to palpation posteriorly.  Patient is able move upper and lower extremities without any difficulty and is ambulatory without any assistance. Neurologic:  Normal speech and language. No gross focal neurologic deficits are appreciated. No gait instability. Skin:  Skin is warm, dry.  There is a very superficial abrasion to the right posterior scalp without active bleeding or foreign body noted. Psychiatric: Mood and affect are normal. Speech and behavior are normal.  ____________________________________________   LABS (all labs ordered are listed, but only  abnormal results are displayed)  Labs Reviewed  CBC WITH DIFFERENTIAL/PLATELET - Abnormal; Notable for the following components:      Result Value   WBC 11.1 (*)    Hemoglobin 11.2 (*)    HCT 35.2 (*)    Neutro Abs 8.2 (*)    All other components within normal limits  COMPREHENSIVE METABOLIC PANEL - Abnormal; Notable for the following components:   Glucose, Bld 129 (*)    All other components within normal limits  ACETAMINOPHEN LEVEL - Abnormal; Notable for the following components:   Acetaminophen (Tylenol), Serum <10 (*)    All other components within normal limits  SALICYLATE LEVEL - Abnormal; Notable for the following components:   Salicylate Lvl <7.0 (*)    All other components within normal limits  ETHANOL  URINALYSIS, COMPLETE (UACMP) WITH MICROSCOPIC  URINE DRUG SCREEN, QUALITATIVE ([facility] ONLY)  LITHIUM LEVEL   ____________________________________________ ___________________________________________  RADIOLOGY Beaulah Corin, personally viewed and evaluated these images (plain radiographs) as part of my medical decision making, as well as reviewing the written report by the radiologist.   Official radiology report(s): CT Head Wo Contrast  Result Date: 08/21/2020 CLINICAL DATA:  Head trauma Midline neck tenderness status post fall EXAM: CT HEAD WITHOUT CONTRAST CT CERVICAL SPINE WITHOUT CONTRAST TECHNIQUE: Multidetector CT imaging of the head and cervical spine was performed following the standard protocol without intravenous contrast. Multiplanar CT image reconstructions of the cervical spine were also generated. COMPARISON:  None. FINDINGS: CT HEAD FINDINGS Brain: No evidence of acute infarction, hemorrhage, hydrocephalus, extra-axial collection or mass lesion/mass effect. Vascular: No hyperdense vessel or unexpected calcification. Skull: Normal. Negative for fracture or focal lesion. Sinuses/Orbits: No acute finding. Other: None. CT CERVICAL SPINE FINDINGS Alignment:  Straightening of normal cervical lordosis likely due to patient positioning or muscle spasm. Skull base and vertebrae: No acute fracture. No primary bone lesion or focal pathologic process. Soft tissues and spinal canal: No prevertebral fluid or swelling. No visible canal hematoma. Disc levels: Vertebral body heights are maintained. No significant disc height loss. Upper chest: Median sternotomy changes partially visualized. Other: Evaluation of the mid cervical spine limited due to streak artifact. IMPRESSION: 1. No acute intracranial abnormality. 2. Evaluation of  the cervical spine is somewhat limited due to streak artifact. No definite acute abnormality is identified. Electronically Signed   By: Acquanetta BellingFarhaan  Mir M.D.   On: 08/21/2020 11:59   CT Cervical Spine Wo Contrast  Result Date: 08/21/2020 CLINICAL DATA:  Head trauma Midline neck tenderness status post fall EXAM: CT HEAD WITHOUT CONTRAST CT CERVICAL SPINE WITHOUT CONTRAST TECHNIQUE: Multidetector CT imaging of the head and cervical spine was performed following the standard protocol without intravenous contrast. Multiplanar CT image reconstructions of the cervical spine were also generated. COMPARISON:  None. FINDINGS: CT HEAD FINDINGS Brain: No evidence of acute infarction, hemorrhage, hydrocephalus, extra-axial collection or mass lesion/mass effect. Vascular: No hyperdense vessel or unexpected calcification. Skull: Normal. Negative for fracture or focal lesion. Sinuses/Orbits: No acute finding. Other: None. CT CERVICAL SPINE FINDINGS Alignment: Straightening of normal cervical lordosis likely due to patient positioning or muscle spasm. Skull base and vertebrae: No acute fracture. No primary bone lesion or focal pathologic process. Soft tissues and spinal canal: No prevertebral fluid or swelling. No visible canal hematoma. Disc levels: Vertebral body heights are maintained. No significant disc height loss. Upper chest: Median sternotomy changes partially  visualized. Other: Evaluation of the mid cervical spine limited due to streak artifact. IMPRESSION: 1. No acute intracranial abnormality. 2. Evaluation of the cervical spine is somewhat limited due to streak artifact. No definite acute abnormality is identified. Electronically Signed   By: Acquanetta BellingFarhaan  Mir M.D.   On: 08/21/2020 11:59    ____________________________________________   PROCEDURES  Procedure(s) performed (including Critical Care):  Procedures   ____________________________________________   INITIAL IMPRESSION / ASSESSMENT AND PLAN / ED COURSE  As part of my medical decision making, I reviewed the following data within the electronic MEDICAL RECORD NUMBER Notes from prior ED visits and [place] Controlled Substance Database  30 year old female is brought to the ED for evaluation of headaches.  Patient is here with a caregiver however patient gives most of the history.  Patient has history of MR and reports that she fell 2-5 times last Friday with an injury to the right posterior portion of her head and continues to have headaches.  She endorses some blurred vision but denied nausea or vomiting.  There is no known history of LOC and caregiver agrees.  CT scan of head and cervical spine was reassuring and patient was made aware.  As patient was being discharged she reported to caregiver and repeated it to the nurse that she was going to get naked and began hitting her head multiple times in order to hurt herself.  Patient has had a history of suicidal ideation and has been seen in this ED for the same.  Labs were drawn and patient was stressed out to go to room 22 for evaluation.  Dr. Delton Prairieylan Smith was made aware that patient was being moved to that room from pod D.  ____________________________________________   FINAL CLINICAL IMPRESSION(S) / ED DIAGNOSES  Final diagnoses:  Contusion of scalp, initial encounter  Cervical pain  Fall, initial encounter     ED Discharge Orders    None       *Please note:  Chales AbrahamsLauren S Dimon was evaluated in Emergency Department on 08/21/2020 for the symptoms described in the history of present illness. She was evaluated in the context of the global COVID-19 pandemic, which necessitated consideration that the patient might be at risk for infection with the SARS-CoV-2 virus that causes COVID-19. Institutional protocols and algorithms that pertain to the evaluation of patients at  risk for COVID-19 are in a state of rapid change based on information released by regulatory bodies including the CDC and federal and state organizations. These policies and algorithms were followed during the patient's care in the ED.  Some ED evaluations and interventions may be delayed as a result of limited staffing during and the pandemic.*   Note:  This document was prepared using Dragon voice recognition software and may include unintentional dictation errors.    Tommi Rumps, PA-C 08/21/20 1339    Delton Prairie, MD 08/21/20 1343

## 2020-08-21 NOTE — ED Notes (Signed)
Patient is ready for discharge. This Clinical research associate called group home [facility] with no answer. Left HIPPA compliance voice mail.

## 2020-08-21 NOTE — ED Provider Notes (Signed)
Patient was cleared by psychiatry for discharge home.  The NP is unable to reverse the IVC so she asked me to reverse that.  I reviewed patient's chart and it seems like patient is here multiple times for vague SI and has been evaluated previously and has not required admissions recently.  Therefore we will discharge patient home per psychiatric recommendations   Concha Se, MD 08/21/20 [phone]

## 2020-08-21 NOTE — ED Notes (Signed)
Pt belongings include one pink dress, one pair underwear, one bra, two sandals, two yellow rings, one fitbit watch, one blue mask. 1/1 belongings bag.

## 2020-08-22 LAB — LITHIUM LEVEL: Lithium Lvl: 0.62 mmol/L (ref 0.60–1.20)

## 2020-08-22 MED ORDER — LORAZEPAM 2 MG PO TABS
2.0000 mg | ORAL_TABLET | Freq: Every day | ORAL | Status: DC
  Administered 2020-08-22: 2 mg via ORAL
  Filled 2020-08-22: qty 1

## 2020-08-22 MED ORDER — QUETIAPINE FUMARATE 25 MG PO TABS
25.0000 mg | ORAL_TABLET | Freq: Three times a day (TID) | ORAL | Status: DC
  Administered 2020-08-22 (×3): 25 mg via ORAL
  Filled 2020-08-22 (×3): qty 1

## 2020-08-22 MED ORDER — QUETIAPINE FUMARATE ER 200 MG PO TB24
400.0000 mg | ORAL_TABLET | Freq: Every day | ORAL | Status: DC
  Filled 2020-08-22: qty 2

## 2020-08-22 MED ORDER — ACETAMINOPHEN 325 MG PO TABS: 650.0000 mg | ORAL_TABLET | Freq: Four times a day (QID) | ORAL | Status: DC | PRN

## 2020-08-22 MED ORDER — CLONAZEPAM 1 MG PO TABS
1.0000 mg | ORAL_TABLET | Freq: Three times a day (TID) | ORAL | Status: DC
  Administered 2020-08-22 (×2): 1 mg via ORAL
  Filled 2020-08-22 (×2): qty 1

## 2020-08-22 MED ORDER — LEVOTHYROXINE SODIUM 75 MCG PO TABS
175.0000 ug | ORAL_TABLET | Freq: Every day | ORAL | Status: DC
  Administered 2020-08-22: 175 ug via ORAL
  Filled 2020-08-22: qty 1

## 2020-08-22 MED ORDER — GABAPENTIN 600 MG PO TABS
600.0000 mg | ORAL_TABLET | Freq: Three times a day (TID) | ORAL | Status: DC
  Administered 2020-08-22 (×2): 600 mg via ORAL
  Filled 2020-08-22 (×2): qty 1

## 2020-08-22 MED ORDER — METFORMIN HCL 500 MG PO TABS
1000.0000 mg | ORAL_TABLET | Freq: Two times a day (BID) | ORAL | Status: DC
  Administered 2020-08-22 (×2): 1000 mg via ORAL
  Filled 2020-08-22 (×2): qty 2

## 2020-08-22 MED ORDER — VITAMIN D 25 MCG (1000 UNIT) PO TABS
1000.0000 [IU] | ORAL_TABLET | Freq: Every day | ORAL | Status: DC
  Administered 2020-08-22: 1000 [IU] via ORAL
  Filled 2020-08-22: qty 1

## 2020-08-22 MED ORDER — DULOXETINE HCL 20 MG PO CPEP
20.0000 mg | ORAL_CAPSULE | Freq: Every day | ORAL | Status: DC
  Administered 2020-08-22: 20 mg via ORAL
  Filled 2020-08-22 (×3): qty 1

## 2020-08-22 MED ORDER — CARBAMAZEPINE 200 MG PO TABS
400.0000 mg | ORAL_TABLET | Freq: Two times a day (BID) | ORAL | Status: DC
  Administered 2020-08-22: 400 mg via ORAL
  Filled 2020-08-22: qty 2

## 2020-08-22 NOTE — ED Notes (Signed)
Spoke with Elio Forget who said staff will come to pick up pt but did not say what time

## 2020-08-22 NOTE — ED Notes (Addendum)
This RN called and spoke with [name] from group home and informed him that pt has been psych cleared and is ready to be picked up. Per [name] "it will have to be later this afternoon when we get more staff". This RN informed [name] that pt needed to be picked up today or that we would have to file report with DSS. [name] verbalized understanding and stated "we've been through this before".

## 2020-08-22 NOTE — ED Notes (Signed)
Dawn Lambert called to say he is calling the [facility] staff to come pick up pt

## 2020-08-22 NOTE — ED Notes (Signed)
RN attempted to call group home, no answer, unable to leave message due to mailbox being full.

## 2020-08-22 NOTE — ED Notes (Signed)
Hourly rounding reveals patient in room. No complaints, stable, in no acute distress. Q15 minute rounds and monitoring via Security Cameras to continue. 

## 2020-08-22 NOTE — ED Notes (Signed)
Pt given juice

## 2020-08-22 NOTE — ED Notes (Signed)
Attempted to call group home to talk to them about arranging for someone to come pick pt up as she has been psych cleared and is ready for discharge. No answer, unable to leave message due to mailbox being full. Will attempted to call again.

## 2020-08-22 NOTE — ED Notes (Signed)
Pt. Was given her lunch tray and a drink.

## 2020-08-22 NOTE — ED Notes (Signed)
This RN called and spoke with [name], RN informed him that we have transportation to send pt back to group home, just need to confirm that there is staff there so we can send pt back. Per [name] there is no staff at the group home and they are coming to pick pt up at 5 pm.

## 2020-08-22 NOTE — ED Notes (Signed)
Pt given breakfast tray and juice att. 

## 2020-08-22 NOTE — ED Notes (Signed)
Called to cancel [facility] consult. Pt seen by in house Psych last evening.

## 2020-08-22 NOTE — ED Notes (Signed)
This Clinical research associate called group home [phone]) and informed [name] that the patient is ready for discharge. [name] said they are short staff and they will pick her up tomorrow. Charge nurse notified.

## 2020-08-22 NOTE — ED Notes (Signed)
Called [facility] to inform them pt is ready for discharge but no one picked up. Left VM with call back number and requested for them to call the ER back.

## 2020-08-22 NOTE — ED Notes (Signed)
Left message for Dawn Lambert to call back.

## 2020-08-22 NOTE — ED Notes (Signed)
Pt given phone att, reminded of 10 min policy.

## 2020-08-22 NOTE — ED Notes (Signed)
Pt out of shower att.

## 2020-08-22 NOTE — ED Notes (Signed)
VS not taken, patient asleep 

## 2020-08-22 NOTE — ED Notes (Signed)
Pt discharged to care of [facility] staff, Ms Crystal. Discharge teaching done and both pt and Ms Crystal verbalized understanding. [facility] staff, MS Crystal signed discharge paper form. Pt given all her personal belongings. Escorted out, ambulatory, a&ox4 and in nad.

## 2020-08-22 NOTE — ED Notes (Signed)
Attempted to call cell phone number listed in chart for Mr. Dawn Lambert as well group home, unable to leave message on either voice mail due to them being full. Will continue to try to reach group home.

## 2020-08-22 NOTE — ED Notes (Signed)
Pt given shower supplies att.

## 2020-08-22 NOTE — ED Notes (Addendum)
Spoke with pts Legal guardian, Artemis Koller, informed her that pt has been seen and cleared and is ready to return to the group home but that the group home did not have anyone to come get her. This RN asked [name] if it would be OK to send pt back via safe transport and Renee is agreeable as long as there is staff at the group home.

## 2020-09-17 ENCOUNTER — Encounter: Payer: Self-pay | Admitting: Medical Oncology

## 2020-09-17 ENCOUNTER — Emergency Department
Admission: EM | Admit: 2020-09-17 | Discharge: 2020-09-17 | Disposition: A | Discharge status: Home / Self Care | Payer: Medicaid Other | Attending: Emergency Medicine | Admitting: Emergency Medicine

## 2020-09-17 ENCOUNTER — Other Ambulatory Visit: Payer: Self-pay

## 2020-09-17 DIAGNOSIS — F913 Oppositional defiant disorder: Secondary | ICD-10-CM | POA: Insufficient documentation

## 2020-09-17 DIAGNOSIS — F79 Unspecified intellectual disabilities: Secondary | ICD-10-CM

## 2020-09-17 DIAGNOSIS — I10 Essential (primary) hypertension: Secondary | ICD-10-CM | POA: Insufficient documentation

## 2020-09-17 DIAGNOSIS — E039 Hypothyroidism, unspecified: Secondary | ICD-10-CM | POA: Insufficient documentation

## 2020-09-17 DIAGNOSIS — Z7984 Long term (current) use of oral hypoglycemic drugs: Secondary | ICD-10-CM | POA: Insufficient documentation

## 2020-09-17 DIAGNOSIS — F4325 Adjustment disorder with mixed disturbance of emotions and conduct: Secondary | ICD-10-CM | POA: Diagnosis present

## 2020-09-17 DIAGNOSIS — Z20822 Contact with and (suspected) exposure to covid-19: Secondary | ICD-10-CM | POA: Diagnosis not present

## 2020-09-17 DIAGNOSIS — Z79899 Other long term (current) drug therapy: Secondary | ICD-10-CM | POA: Insufficient documentation

## 2020-09-17 DIAGNOSIS — E119 Type 2 diabetes mellitus without complications: Secondary | ICD-10-CM | POA: Diagnosis not present

## 2020-09-17 DIAGNOSIS — Z593 Problems related to living in residential institution: Secondary | ICD-10-CM

## 2020-09-17 DIAGNOSIS — R4689 Other symptoms and signs involving appearance and behavior: Secondary | ICD-10-CM

## 2020-09-17 LAB — RESP PANEL BY RT-PCR (FLU A&B, COVID) ARPGX2
Influenza A by PCR: NEGATIVE
Influenza B by PCR: NEGATIVE
SARS Coronavirus 2 by RT PCR: NEGATIVE

## 2020-09-17 MED ORDER — LORAZEPAM 1 MG PO TABS
1.0000 mg | ORAL_TABLET | Freq: Once | ORAL | Status: AC
  Administered 2020-09-17: 1 mg via ORAL
  Filled 2020-09-17: qty 1

## 2020-09-17 MED ORDER — ZIPRASIDONE MESYLATE 20 MG IM SOLR
20.0000 mg | Freq: Once | INTRAMUSCULAR | Status: AC
  Administered 2020-09-17: 20 mg via INTRAMUSCULAR
  Filled 2020-09-17: qty 20

## 2020-09-17 NOTE — ED Notes (Signed)
Pt received lunch 
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
## 2020-09-17 NOTE — ED Notes (Addendum)
Pt appears calm and cooperative at this time. Pt states "it probably wasn't very kind of me to yell at [facility] when she was here." Pt given verbal counseling.

## 2020-09-17 NOTE — BH Assessment (Addendum)
Writer spoke with Group Home ([phone]), informed him the patient is ready for discharge. He states he knew she didn't need to be in the ER. EMS asked if she wanted to go to the ER, she told them yes and that's why she is here.  Writer informed patient's nurse Ronnald Collum) and ER MD (Dr. Cyril Loosen).

## 2020-09-17 NOTE — ED Notes (Signed)
Pt states the Geodon shot "makes her feel better," and she'd like to be prescribed geodon for at her home. Pt states "they have a nurse but I don't get that."

## 2020-09-17 NOTE — ED Notes (Signed)
Pt threatening to take off all her clothes.

## 2020-09-17 NOTE — ED Notes (Signed)
Pt states that she wants to see a Child psychotherapist.

## 2020-09-17 NOTE — ED Notes (Signed)
Pt removing shirt and bra again.
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
## 2020-09-17 NOTE — ED Provider Notes (Signed)
[facility] Emergency Department Provider Note   ____________________________________________    I have reviewed the triage vital signs and the nursing notes.   HISTORY  Chief Complaint placement     HPI Dawn Lambert is a 30 y.o. female with history as noted below who apparently became unhappy with her group home for reasons that are not entirely clear, and went outside into the street and laid down.  EMS was called.  Patient has no medical complaints at this time  Past Medical History:  Diagnosis Date  . Anxiety   . Arnold-Chiari malformation (HCC)   . Atrial septal defect   . Complication of anesthesia    when pt was 11, woke up during surgery (muscle biopsy)  . Constipation   . Contraception management   . Dandruff   . Dermatomyositis (HCC)    age 36  . Diabetes mellitus without complication (HCC)   . Elevated blood pressure   . Hypertension   . Hypothyroidism   . Mental retardation   . Mood disorder (HCC)   . Overweight(278.02)   . Pelvic pain   . Prediabetes     Patient Active Problem List   Diagnosis Date Noted  . 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 Intellectual disability 10/12/2017  . Umbilical hernia without obstruction and without gangrene 08/15/2017  . Chest pain 11/05/2012  . Atrial septal defect 09/19/2011    Past Surgical History:  Procedure Laterality Date  . ASD REPAIR     at age 65  . UMBILICAL HERNIA REPAIR N/A 09/17/2017   Procedure: HERNIA REPAIR UMBILICAL ADULT;  Surgeon: Earline Mayotte, MD;  Location: [facility] ORS;  Service: General;  Laterality: N/A;    Prior to Admission medications   Medication Sig Start Date End Date Taking? Authorizing Provider  acetaminophen (TYLENOL) 325 MG tablet Take 650 mg by mouth every 6 (six) hours as needed for mild pain, moderate pain or fever.    [provider]  atorvastatin (LIPITOR) 10 MG tablet Take 10 mg by mouth  daily.     [provider]  carbamazepine (TEGRETOL) 200 MG tablet Take 400 mg by mouth 2 (two) times daily.    [provider]  cholecalciferol (VITAMIN D) 1000 units tablet Take 1,000 Units by mouth daily.     [provider]  clonazePAM (KLONOPIN) 1 MG tablet Take 1 mg by mouth 3 (three) times daily.     [provider]  clotrimazole (LOTRIMIN) 1 % cream Apply 1 application topically 2 (two) times daily as needed (yeast under the breasts).     [provider]  Desoximetasone 0.25 % LIQD Apply 1 application topically daily as needed (skin issue).    [provider]  DULoxetine (CYMBALTA) 20 MG capsule Take 1 capsule (20 mg total) by mouth daily for 21 days. 02/02/20 02/23/20  Merwyn Katos, MD  Fluocinolone Acetonide Scalp (DERMA-SMOOTHE/FS SCALP) 0.01 % OIL APPLY TOPICALLY TO SCALP ONCE A WEEK OR UP TO 3 DAYS PER WEEK AS NEEDED FOR FLARES. 08/18/20   Deirdre Evener, MD  fluticasone [facility]) 50 MCG/ACT nasal spray Place 2 sprays into the nose daily as needed for allergies.     [provider]  gabapentin (NEURONTIN) 600 MG tablet Take 600 mg by mouth 3 (three) times daily.    [provider]  levothyroxine (SYNTHROID, LEVOTHROID) 175 MCG tablet Take 175 mcg by mouth daily before breakfast.  [provider]  lidocaine (XYLOCAINE) 2 % solution Use as directed 15 mLs in the mouth or throat as needed for mouth pain. 07/04/20   Lucy Chris, PA  lisinopril (PRINIVIL,ZESTRIL) 20 MG tablet Take 20 mg by mouth daily.     [provider]  lithium carbonate 300 MG capsule Take 900 mg by mouth at bedtime.  Patient not taking: No sig reported    [provider]  LORazepam (ATIVAN) 2 MG tablet Take 2 mg by mouth daily.    [provider]  magnesium hydroxide (MILK OF MAGNESIA) 400 MG/5ML suspension Take 30 mLs by mouth at bedtime as needed for moderate constipation.  Patient not taking: No sig  reported    [provider]  medroxyPROGESTERone (DEPO-PROVERA) 150 MG/ML injection Inject 150 mg into the muscle every 3 (three) months.     [provider]  metFORMIN (GLUCOPHAGE) 500 MG tablet Take 1,000 mg by mouth 2 (two) times daily.     [provider]  mometasone (ELOCON) 0.1 % cream Apply 1 application topically daily as needed (Itching of the ears).  Patient not taking: No sig reported    [provider]  Multiple Vitamin (THEREMS PO) Take 1 tablet by mouth daily. Patient not taking: No sig reported    [provider]  Omega-3 1000 MG CAPS Take 1,000 mg by mouth 2 (two) times daily. Patient not taking: No sig reported    [provider]  Paliperidone Palmitate ER (INVEGA TRINZA) 819 MG/2.625ML injection Inject 819 mg into the muscle every 3 (three) months. Patient not taking: No sig reported    [provider]  propranolol (INDERAL) 40 MG tablet Take 40 mg by mouth 2 (two) times daily.    [provider]  QUEtiapine (SEROQUEL XR) 400 MG 24 hr tablet Take 1 tablet (400 mg total) by mouth at bedtime. 10/08/18   Mariel Craft, MD  QUEtiapine (SEROQUEL) 25 MG tablet Take 1 tablet (25 mg total) by mouth 3 (three) times daily with meals. 10/08/18   Mariel Craft, MD     Allergies Augmentin [amoxicillin-pot clavulanate]  No family history on file.  Social History Social History   Tobacco Use  . Smoking status: Never Smoker  . Smokeless tobacco: Never Used  Vaping Use  . Vaping Use: Never used  Substance Use Topics  . Alcohol use: No  . Drug use: No    Review of Systems  Constitutional: No fever/chills     Gastrointestinal: No abdominal pain.  No nausea, no vomiting.   Genitourinary: Negative for dysuria. Musculoskeletal: Negative for back pain. Skin: Negative for rash. Neurological: Negative for headaches     ____________________________________________   PHYSICAL EXAM:  VITAL SIGNS: ED  Triage Vitals  Enc Vitals Group     BP 09/17/20 1310 (!) 149/77     Pulse Rate 09/17/20 1310 81     Resp 09/17/20 1310 18     Temp 09/17/20 1310 98.4 F (36.9 C)     Temp Source 09/17/20 1310 Oral     SpO2 09/17/20 1310 99 %     Weight 09/17/20 1308 126 kg (277 lb 12.5 oz)     Height 09/17/20 1308 1.6 m (5\' 3" )     Head Circumference --      Peak Flow --      Pain Score 09/17/20 1308 0     Pain Loc --      Pain Edu? --  Excl. in GC? --      Constitutional: Alert and oriented. Eyes: Conjunctivae are normal.  Head: Atraumatic. Nose: No congestion/rhinnorhea. Mouth/Throat: Mucous membranes are moist.   Cardiovascular: Normal rate, regular rhythm.  Respiratory: Normal respiratory effort.  No retractions. Genitourinary: deferred Musculoskeletal: No lower extremity tenderness nor edema.   Neurologic:  Normal speech and language. No gross focal neurologic deficits are appreciated.   Skin:  Skin is warm, dry and intact. No rash noted.   ____________________________________________   LABS (all labs ordered are listed, but only abnormal results are displayed)  Labs Reviewed  RESP PANEL BY RT-PCR (FLU A&B, COVID) ARPGX2  POC URINE PREG, ED   ____________________________________________  EKG   ____________________________________________  RADIOLOGY   ____________________________________________   PROCEDURES  Procedure(s) performed: No  Procedures   Critical Care performed: No ____________________________________________   INITIAL IMPRESSION / ASSESSMENT AND PLAN / ED COURSE  Pertinent labs & imaging results that were available during my care of the patient were reviewed by me and considered in my medical decision making (see chart for details).  Patient presents because she is unhappy with her group home.  No SI or HI.  She was acting out so that she could be taken to the hospital to try to go to another group home.  Will consult TTS.  Patient is  medically cleared for behavioral medicine   The patient has been placed in psychiatric observation due to the need to provide a safe environment for the patient while obtaining psychiatric consultation and evaluation, as well as ongoing medical and medication management to treat the patient's condition.  The patient has not been placed under full IVC at this time.    ____________________________________________   FINAL CLINICAL IMPRESSION(S) / ED DIAGNOSES  Final diagnoses:  Lives in group home  Oppositional behavior      NEW MEDICATIONS STARTED DURING THIS VISIT:  New Prescriptions   No medications on file     Note:  This document was prepared using Dragon voice recognition software and may include unintentional dictation errors.   Jene Every, MD 09/17/20 [phone]
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
## 2020-09-17 NOTE — ED Notes (Signed)
Assumed care at this time

## 2020-09-17 NOTE — ED Triage Notes (Signed)
Pt to ED via ems, states that she does not want to live at [facility] group home any more, went and laid in the street to get EMS to bring her to the ED.

## 2020-09-17 NOTE — ED Notes (Signed)
Pt given meal

## 2020-09-17 NOTE — ED Notes (Signed)
Pt throwing medication and laughing.
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
## 2020-09-17 NOTE — ED Notes (Signed)
Attempted to call mother [phone] and [phone] without answer, message left for call-back

## 2020-09-17 NOTE — BH Assessment (Signed)
Comprehensive Clinical Assessment (CCA) Screening, Triage and Referral Note  09/17/2020 Dawn Lambert [id number]  Chief Complaint:  Chief Complaint  Patient presents with  . placement   Visit Diagnosis: MR/IDD  Patient Reported Information How did you hear about [name]? Self   Referral name: Self   Referral phone number: No data recorded Whom do you see for routine medical problems? No data recorded  Practice/Facility Name: No data recorded  Practice/Facility Phone Number: No data recorded  Name of Contact: No data recorded  Contact Number: No data recorded  Contact Fax Number: No data recorded  Prescriber Name: No data recorded  Prescriber Address (if known): No data recorded What Is the Reason for Your Visit/Call Today? Wanted to come to the ER to get away from Group Home.  How Long Has This Been Causing You Problems? > than 6 months  Have You Recently Been in Any Inpatient Treatment (Hospital/Detox/Crisis Center/28-Day Program)? No   Name/Location of Program/Hospital:No data recorded  How Long Were You There? No data recorded  When Were You Discharged? No data recorded Have You Ever Received Services From [facility] Before? Yes   Who Do You See at [facility]? ER visits  Have You Recently Had Any Thoughts About Hurting Yourself? Yes   Are You Planning to Commit Suicide/Harm Yourself At This time?  No  Have you Recently Had Thoughts About Hurting Someone Dawn Lambert? No   Explanation: No data recorded Have You Used Any Alcohol or Drugs in the Past 24 Hours? No   How Long Ago Did You Use Drugs or Alcohol?  No data recorded  What Did You Use and How Much? No data recorded What Do You Feel Would Help You the Most Today? -- (Patient MR/IDD)  Do You Currently Have a Therapist/Psychiatrist? No   Name of Therapist/Psychiatrist: No data recorded  Have You Been Recently Discharged From Any Office Practice or Programs? No   Explanation of Discharge From Practice/Program:  No  data recorded    CCA Screening Triage Referral Assessment Type of Contact: Face-to-Face   Is this Initial or Reassessment? No data recorded  Date Telepsych consult ordered in [facility]:  02/01/2020   Time Telepsych consult ordered in [facility]:  1555  Patient Reported Information Reviewed? Yes   Patient Left Without Being Seen? No data recorded  Reason for Not Completing Assessment: No data recorded Collateral Involvement: No data recorded Does Patient Have a Court Appointed Legal Guardian? No data recorded  Name and Contact of Legal Guardian:  Dawn Lambert [phone]  If Minor and Not Living with Parent(s), Who has Custody? n/a  Is CPS involved or ever been involved? Never  Is APS involved or ever been involved? Never  Patient Determined To Be At Risk for Harm To Self or Others Based on Review of Patient Reported Information or Presenting Complaint? No   Method: No data recorded  Availability of Means: No data recorded  Intent: No data recorded  Notification Required: No data recorded  Additional Information for Danger to Others Potential:  No data recorded  Additional Comments for Danger to Others Potential:  No data recorded  Are There Guns or Other Weapons in Your Home?  No data recorded   Types of Guns/Weapons: No data recorded   Are These Weapons Safely Secured?                              No data recorded   Who  Could Verify You Are Able To Have These Secured:    No data recorded Do You Have any Outstanding Charges, Pending Court Dates, Parole/Probation? No data recorded Contacted To Inform of Risk of Harm To Self or Others: No data recorded Location of Assessment: [facility] ED  Does Patient Present under Involuntary Commitment? No   IVC Papers Initial File Date: 08/21/2020   [place] of Residence: [place]  Patient Currently Receiving the Following Services: Group Home; Medication Management   Determination of Need: Emergent (2 hours)   Options For Referral: Group  Home  Dawn Gilford MS, LCAS, [facility], [facility] Therapeutic Triage Specialist 09/17/2020 5:28 PM
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
## 2020-09-17 NOTE — ED Notes (Signed)
Per psych team, group home coming in 1 hour to take pt back.

## 2020-09-18 ENCOUNTER — Emergency Department
Admission: EM | Admit: 2020-09-18 | Discharge: 2020-09-19 | Disposition: A | Discharge status: Home / Self Care | Payer: Medicaid Other | Attending: Emergency Medicine | Admitting: Emergency Medicine

## 2020-09-18 ENCOUNTER — Other Ambulatory Visit: Payer: Self-pay

## 2020-09-18 DIAGNOSIS — E119 Type 2 diabetes mellitus without complications: Secondary | ICD-10-CM | POA: Diagnosis not present

## 2020-09-18 DIAGNOSIS — R456 Violent behavior: Secondary | ICD-10-CM | POA: Diagnosis present

## 2020-09-18 DIAGNOSIS — I10 Essential (primary) hypertension: Secondary | ICD-10-CM | POA: Insufficient documentation

## 2020-09-18 DIAGNOSIS — R45851 Suicidal ideations: Secondary | ICD-10-CM | POA: Diagnosis not present

## 2020-09-18 DIAGNOSIS — F4325 Adjustment disorder with mixed disturbance of emotions and conduct: Secondary | ICD-10-CM | POA: Diagnosis present

## 2020-09-18 DIAGNOSIS — F79 Unspecified intellectual disabilities: Secondary | ICD-10-CM | POA: Diagnosis not present

## 2020-09-18 DIAGNOSIS — E039 Hypothyroidism, unspecified: Secondary | ICD-10-CM | POA: Insufficient documentation

## 2020-09-18 DIAGNOSIS — Z79899 Other long term (current) drug therapy: Secondary | ICD-10-CM | POA: Insufficient documentation

## 2020-09-18 DIAGNOSIS — F4320 Adjustment disorder, unspecified: Secondary | ICD-10-CM | POA: Insufficient documentation
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
## 2020-09-18 NOTE — ED Notes (Signed)
Pt dressed out, belongings to include pink dress, pink underwear, and red slippers- jewelry in bag from officer with watch, bracelet, and ring

## 2020-09-19 DIAGNOSIS — F4325 Adjustment disorder with mixed disturbance of emotions and conduct: Secondary | ICD-10-CM | POA: Diagnosis not present

## 2020-09-19 DIAGNOSIS — F79 Unspecified intellectual disabilities: Secondary | ICD-10-CM

## 2020-09-19 DIAGNOSIS — R45851 Suicidal ideations: Secondary | ICD-10-CM

## 2020-09-19 MED ORDER — METFORMIN HCL 500 MG PO TABS
1000.0000 mg | ORAL_TABLET | Freq: Two times a day (BID) | ORAL | Status: DC
  Administered 2020-09-19: 1000 mg via ORAL
  Filled 2020-09-19: qty 2

## 2020-09-19 MED ORDER — LISINOPRIL 10 MG PO TABS: 20.0000 mg | ORAL_TABLET | Freq: Every day | ORAL | Status: DC

## 2020-09-19 MED ORDER — ZIPRASIDONE HCL 20 MG PO CAPS
20.0000 mg | ORAL_CAPSULE | Freq: Once | ORAL | Status: DC | PRN
  Filled 2020-09-19: qty 1

## 2020-09-19 MED ORDER — LORAZEPAM 2 MG PO TABS: 2.0000 mg | ORAL_TABLET | Freq: Every day | ORAL | Status: DC | PRN

## 2020-09-19 MED ORDER — ATORVASTATIN CALCIUM 20 MG PO TABS: 10.0000 mg | ORAL_TABLET | Freq: Every day | ORAL | Status: DC

## 2020-09-19 MED ORDER — LEVOTHYROXINE SODIUM 50 MCG PO TABS: 175.0000 ug | ORAL_TABLET | Freq: Every day | ORAL | Status: DC

## 2020-09-19 MED ORDER — GABAPENTIN 600 MG PO TABS
600.0000 mg | ORAL_TABLET | Freq: Three times a day (TID) | ORAL | Status: DC
  Administered 2020-09-19: 600 mg via ORAL
  Filled 2020-09-19: qty 1

## 2020-09-19 MED ORDER — LITHIUM CARBONATE 300 MG PO CAPS
900.0000 mg | ORAL_CAPSULE | Freq: Every day | ORAL | Status: DC
  Filled 2020-09-19: qty 3

## 2020-09-19 MED ORDER — LORAZEPAM 2 MG PO TABS
2.0000 mg | ORAL_TABLET | Freq: Every day | ORAL | Status: DC
  Administered 2020-09-19: 2 mg via ORAL
  Filled 2020-09-19: qty 1

## 2020-09-19 MED ORDER — MAGNESIUM HYDROXIDE 400 MG/5ML PO SUSP: 30.0000 mL | Freq: Every evening | ORAL | Status: DC | PRN

## 2020-09-19 MED ORDER — LORAZEPAM 2 MG PO TABS: 2.0000 mg | ORAL_TABLET | Freq: Every day | ORAL | Status: DC

## 2020-09-19 MED ORDER — ZIPRASIDONE HCL 20 MG PO CAPS: 20.0000 mg | ORAL_CAPSULE | Freq: Once | ORAL | 1 refills | Status: DC | PRN

## 2020-09-19 MED ORDER — CARBAMAZEPINE 200 MG PO TABS
400.0000 mg | ORAL_TABLET | Freq: Two times a day (BID) | ORAL | Status: DC
  Administered 2020-09-19: 400 mg via ORAL
  Filled 2020-09-19: qty 2

## 2020-09-19 MED ORDER — ACETAMINOPHEN 500 MG PO TABS
1000.0000 mg | ORAL_TABLET | Freq: Once | ORAL | Status: AC
  Administered 2020-09-19: 1000 mg via ORAL
  Filled 2020-09-19: qty 2

## 2020-09-19 MED ORDER — VITAMIN D 25 MCG (1000 UNIT) PO TABS
1000.0000 [IU] | ORAL_TABLET | Freq: Every day | ORAL | Status: DC
  Administered 2020-09-19: 1000 [IU] via ORAL
  Filled 2020-09-19: qty 1

## 2020-09-19 MED ORDER — PROPRANOLOL HCL 20 MG PO TABS
40.0000 mg | ORAL_TABLET | Freq: Two times a day (BID) | ORAL | Status: DC
  Administered 2020-09-19: 40 mg via ORAL
  Filled 2020-09-19: qty 2

## 2020-09-19 MED ORDER — ACETAMINOPHEN 325 MG PO TABS: 650.0000 mg | ORAL_TABLET | Freq: Four times a day (QID) | ORAL | Status: DC | PRN

## 2020-09-19 MED ORDER — LEVOTHYROXINE SODIUM 50 MCG PO TABS
175.0000 ug | ORAL_TABLET | Freq: Every day | ORAL | Status: DC
  Administered 2020-09-19: 175 ug via ORAL
  Filled 2020-09-19: qty 4

## 2020-09-19 MED ORDER — CLONAZEPAM 0.5 MG PO TABS
1.0000 mg | ORAL_TABLET | Freq: Three times a day (TID) | ORAL | Status: DC
  Administered 2020-09-19: 1 mg via ORAL
  Filled 2020-09-19: qty 2

## 2020-09-19 NOTE — ED Notes (Signed)
Patient ambulated to restroom with steady gait.

## 2020-09-19 NOTE — Discharge Instructions (Signed)
Follow up with out patient provider

## 2020-09-19 NOTE — BH Assessment (Signed)
Comprehensive Clinical Assessment (CCA) Screening, Triage and Referral Note  09/19/2020 Dawn Lambert [id number]  Chief Complaint:  Chief Complaint  Patient presents with  . IVC   Visit Diagnosis: Adjust Disorder  Patient Reported Information How did you hear about [name]? Self   Referral name: self   Referral phone number: No data recorded Whom do you see for routine medical problems? No data recorded  Practice/Facility Name: No data recorded  Practice/Facility Phone Number: No data recorded  Name of Contact: No data recorded  Contact Number: No data recorded  Contact Fax Number: No data recorded  Prescriber Name: No data recorded  Prescriber Address (if known): No data recorded What Is the Reason for Your Visit/Call Today? States she want to end her life and admits to saying it to get away from Group Home  How Long Has This Been Causing You Problems? > than 6 months  Have You Recently Been in Any Inpatient Treatment (Hospital/Detox/Crisis Center/28-Day Program)? No   Name/Location of Program/Hospital:No data recorded  How Long Were You There? No data recorded  When Were You Discharged? No data recorded Have You Ever Received Services From [facility] Before? Yes   Who Do You See at [facility]? Medical & Mental Health Treatment  Have You Recently Had Any Thoughts About Hurting Yourself? No   Are You Planning to Commit Suicide/Harm Yourself At This time?  No  Have you Recently Had Thoughts About Hurting Someone Dawn Lambert? No   Explanation: No data recorded Have You Used Any Alcohol or Drugs in the Past 24 Hours? No   How Long Ago Did You Use Drugs or Alcohol?  No data recorded  What Did You Use and How Much? No data recorded What Do You Feel Would Help You the Most Today? Treatment for Depression or other mood problem  Do You Currently Have a Therapist/Psychiatrist? No   Name of Therapist/Psychiatrist: No data recorded  Have You Been Recently Discharged From Any  Office Practice or Programs? No   Explanation of Discharge From Practice/Program:  No data recorded    CCA Screening Triage Referral Assessment Type of Contact: Face-to-Face   Is this Initial or Reassessment? No data recorded  Date Telepsych consult ordered in [facility]:  02/01/2020   Time Telepsych consult ordered in [facility]:  1555  Patient Reported Information Reviewed? Yes   Patient Left Without Being Seen? No data recorded  Reason for Not Completing Assessment: No data recorded Collateral Involvement: No data recorded Does Patient Have a Court Appointed Legal Guardian? No data recorded  Name and Contact of Legal Guardian:  Cloria Ciresi [phone]  If Minor and Not Living with Parent(s), Who has Custody? n/a  Is CPS involved or ever been involved? Never  Is APS involved or ever been involved? Never  Patient Determined To Be At Risk for Harm To Self or Others Based on Review of Patient Reported Information or Presenting Complaint? No   Method: No data recorded  Availability of Means: No data recorded  Intent: No data recorded  Notification Required: No data recorded  Additional Information for Danger to Others Potential:  No data recorded  Additional Comments for Danger to Others Potential:  No data recorded  Are There Guns or Other Weapons in Your Home?  No data recorded   Types of Guns/Weapons: No data recorded   Are These Weapons Safely Secured?  No data recorded   Who Could Verify You Are Able To Have These Secured:    No data recorded Do You Have any Outstanding Charges, Pending Court Dates, Parole/Probation? No data recorded Contacted To Inform of Risk of Harm To Self or Others: No data recorded Location of Assessment: [facility] ED  Does Patient Present under Involuntary Commitment? Yes   IVC Papers Initial File Date: 09/19/2020   [place] of Residence: [place]  Patient Currently Receiving the Following Services: Group Home; Medication  Management   Determination of Need: Emergent (2 hours)   Options For Referral: Group Home  Lilyan Gilford MS, LCAS, [facility], [facility] Therapeutic Triage Specialist 09/19/2020 2:51 PM

## 2020-09-19 NOTE — ED Notes (Signed)
Dinner tray providen. No other needs found at this moment.  

## 2020-09-19 NOTE — ED Notes (Signed)
Psych and TTS at bedside for reeval

## 2020-09-19 NOTE — ED Notes (Signed)
IVC/pending inpatient psych admission when medically cleared 

## 2020-09-19 NOTE — ED Notes (Signed)
Group home coming to get patient after dinner
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
## 2020-09-19 NOTE — ED Notes (Signed)
Patient hungry, provided sandwich box.

## 2020-09-19 NOTE — Consult Note (Signed)
[facility] Psych ED Discharge  09/19/2020 4:39 PM Dawn Lambert  MRN:  [id number] Principal Problem: Adjustment disorder with mixed disturbance of emotions and conduct Discharge Diagnoses: Principal Problem:   Adjustment disorder with mixed disturbance of emotions and conduct Active Problems:   Intellectual disability   Suicidal ideation  Subjective: "I just would like to get some Geodon and go back to my group home."  30 yo female presented to the ED after getting upset at her group home and becoming very agitated with threats to lay in the road to hurt herself.  She reports the thoughts were more intense today and wanted Geodon like she had here on Friday as it helped her calm.  This provider let her know that she could have this as needed IF she really needed it as she is already on several medications.  She was agreeable to this and wanted to return to the group home, group home agreeable.  No suicidal/homicidal ideations, hallucinations, or substance abuse.  Psychiatrically stable for discharge.  Total Time spent with patient: 45 minutes  Past Psychiatric History: anxiety, mood d/o  Past Medical History:  Past Medical History:  Diagnosis Date  . Anxiety   . Arnold-Chiari malformation (HCC)   . Atrial septal defect   . Complication of anesthesia    when pt was 11, woke up during surgery (muscle biopsy)  . Constipation   . Contraception management   . Dandruff   . Dermatomyositis (HCC)    age 69  . Diabetes mellitus without complication (HCC)   . Elevated blood pressure   . Hypertension   . Hypothyroidism   . Mental retardation   . Mood disorder (HCC)   . Overweight(278.02)   . Pelvic pain   . Prediabetes     Past Surgical History:  Procedure Laterality Date  . ASD REPAIR     at age 81  . UMBILICAL HERNIA REPAIR N/A 09/17/2017   Procedure: HERNIA REPAIR UMBILICAL ADULT;  Surgeon: Earline Mayotte, MD;  Location: [facility] ORS;  Service: General;  Laterality: N/A;   Family  History: History reviewed. No pertinent family history. Family Psychiatric  History: none Social History:  Social History   Substance and Sexual Activity  Alcohol Use No     Social History   Substance and Sexual Activity  Drug Use No    Social History   Socioeconomic History  . Marital status: Single    Spouse name: Not on file  . Number of children: Not on file  . Years of education: Not on file  . Highest education level: Not on file  Occupational History  . Not on file  Tobacco Use  . Smoking status: Never Smoker  . Smokeless tobacco: Never Used  Vaping Use  . Vaping Use: Never used  Substance and Sexual Activity  . Alcohol use: No  . Drug use: No  . Sexual activity: Not on file  Other Topics Concern  . Not on file  Social History Narrative  . Not on file   Social Determinants of Health   Financial Resource Strain: Not on file  Food Insecurity: Not on file  Transportation Needs: Not on file  Physical Activity: Not on file  Stress: Not on file  Social Connections: Not on file    Has this patient used any form of tobacco in the last 30 days? (Cigarettes, Smokeless Tobacco, Cigars, and/or Pipes) NA  Current Medications: Current Facility-Administered Medications  Medication Dose Route Frequency Provider Last Rate Last  Admin  . acetaminophen (TYLENOL) tablet 650 mg  650 mg Oral Q6H PRN Charm Rings, NP      . atorvastatin (LIPITOR) tablet 10 mg  10 mg Oral QHS Charm Rings, NP      . carbamazepine (TEGRETOL) tablet 400 mg  400 mg Oral BID Charm Rings, NP   400 mg at 09/19/20 1507  . cholecalciferol (VITAMIN D3) tablet 1,000 Units  1,000 Units Oral Daily Charm Rings, NP   1,000 Units at 09/19/20 1507  . clonazePAM (KLONOPIN) tablet 1 mg  1 mg Oral TID Charm Rings, NP   1 mg at 09/19/20 1507  . gabapentin (NEURONTIN) tablet 600 mg  600 mg Oral TID Charm Rings, NP   600 mg at 09/19/20 1508  . levothyroxine (SYNTHROID) tablet 175 mcg  175 mcg  Oral Q0600 Charm Rings, NP   175 mcg at 09/19/20 1507  . lisinopril (ZESTRIL) tablet 20 mg  20 mg Oral QHS Nanine Means Y, NP      . lithium carbonate capsule 900 mg  900 mg Oral QHS Charm Rings, NP      . LORazepam (ATIVAN) tablet 2 mg  2 mg Oral Daily PRN Charm Rings, NP      . LORazepam (ATIVAN) tablet 2 mg  2 mg Oral Daily Charm Rings, NP   2 mg at 09/19/20 1507  . magnesium hydroxide (MILK OF MAGNESIA) suspension 30 mL  30 mL Oral QHS PRN Charm Rings, NP      . metFORMIN (GLUCOPHAGE) tablet 1,000 mg  1,000 mg Oral BID AC Charm Rings, NP   1,000 mg at 09/19/20 1628  . propranolol (INDERAL) tablet 40 mg  40 mg Oral BID Charm Rings, NP   40 mg at 09/19/20 1507  . ziprasidone (GEODON) capsule 20 mg  20 mg Oral Once PRN Charm Rings, NP       Current Outpatient Medications  Medication Sig Dispense Refill  . acetaminophen (TYLENOL) 325 MG tablet Take 650 mg by mouth every 6 (six) hours as needed for mild pain, moderate pain or fever.    [name] atorvastatin (LIPITOR) 10 MG tablet Take 10 mg by mouth at bedtime.    . carbamazepine (TEGRETOL) 200 MG tablet Take 400 mg by mouth 2 (two) times daily.    . cholecalciferol (VITAMIN D) 1000 units tablet Take 1,000 Units by mouth daily.     . clonazePAM (KLONOPIN) 1 MG tablet Take 1 mg by mouth 3 (three) times daily.     . Fluocinolone Acetonide Scalp (DERMA-SMOOTHE/FS SCALP) 0.01 % OIL APPLY TOPICALLY TO SCALP ONCE A WEEK OR UP TO 3 DAYS PER WEEK AS NEEDED FOR FLARES. 118.28 mL 10  . fluticasone (FLONASE) 50 MCG/ACT nasal spray Place 2 sprays into both nostrils daily as needed for allergies.    [name] gabapentin (NEURONTIN) 600 MG tablet Take 600 mg by mouth 3 (three) times daily.    [name] levothyroxine (SYNTHROID, LEVOTHROID) 175 MCG tablet Take 175 mcg by mouth daily before breakfast.    . lidocaine (XYLOCAINE) 2 % solution Use as directed 15 mLs in the mouth or throat as needed for mouth pain. 100 mL 0  . lisinopril  (PRINIVIL,ZESTRIL) 20 MG tablet Take 20 mg by mouth at bedtime.    [name] lithium carbonate 300 MG capsule Take 900 mg by mouth at bedtime.    [name] LORazepam (ATIVAN) 2 MG tablet Take 2 mg by mouth  daily.    [name]. LORazepam (ATIVAN) 2 MG tablet Take 2 mg by mouth daily as needed for sedation.    . magnesium hydroxide (MILK OF MAGNESIA) 400 MG/5ML suspension Take 30 mLs by mouth at bedtime as needed for moderate constipation.    . medroxyPROGESTERone (DEPO-PROVERA) 150 MG/ML injection Inject 150 mg into the muscle every 3 (three) months.     . metFORMIN (GLUCOPHAGE) 500 MG tablet Take 1,000 mg by mouth 2 (two) times daily.     . mometasone (ELOCON) 0.1 % cream Apply 1 application topically daily as needed (Itching of the ears).    . Multiple Vitamin (THEREMS PO) Take 1 tablet by mouth daily.    . Paliperidone Palmitate ER (INVEGA TRINZA) 819 MG/2.625ML injection Inject 819 mg into the muscle every 3 (three) months.    . propranolol (INDERAL) 40 MG tablet Take 40 mg by mouth 2 (two) times daily.    . QUEtiapine (SEROQUEL XR) 400 MG 24 hr tablet Take 1 tablet (400 mg total) by mouth at bedtime. 60 tablet 2  . QUEtiapine (SEROQUEL) 25 MG tablet Take 1 tablet (25 mg total) by mouth 3 (three) times daily with meals. 90 tablet 2   PTA Medications: (Not in a hospital admission)   Musculoskeletal: Strength & Muscle Tone: within normal limits Gait & Station: normal Patient leans: N/A  Psychiatric Specialty Exam:  Presentation  General Appearance: Appropriate for Environment  Eye Contact:Good  Speech:Clear and Coherent  Speech Volume:Normal  Handedness:Right  Mood and Affect  Mood:  Anxious at times  Affect:Appropriate; Congruent  Thought Process  Thought Processes:Coherent  Descriptions of Associations:Intact  Orientation:Full (Time, Place and Person)  Thought Content:Logical  History of Schizophrenia/Schizoaffective disorder:  NA Duration of Psychotic Symptoms:  NA Hallucinations:   None Ideas of Reference:None  Suicidal Thoughts: None  Homicidal Thoughts: None  Sensorium  Memory:Immediate Good; Recent Good; Remote Good  Judgment:  Fair  Insight:Good  Executive Functions  Concentration:Good  Attention Span:Good  Recall:Good  Fund of Knowledge:Good  Language:Good  Psychomotor Activity  Psychomotor Activity:  WDL  Assets  Assets:Communication Skills; Desire for Improvement; Resilience; Social Support  Sleep  Sleep:No data recorded  Physical Exam: Physical Exam Vitals and nursing note reviewed.  Constitutional:      Appearance: Normal appearance.  HENT:     Head: Normocephalic.     Nose: Nose normal.  Pulmonary:     Effort: Pulmonary effort is normal.  Musculoskeletal:        General: Normal range of motion.     Cervical back: Normal range of motion.  Neurological:     General: No focal deficit present.     Mental Status: She is alert and oriented to person, place, and time.  Psychiatric:        Attention and Perception: Attention and perception normal.        Mood and Affect: Mood is anxious.        Speech: Speech normal.        Behavior: Behavior normal. Behavior is cooperative.        Thought Content: Thought content normal.        Cognition and Memory: Cognition is impaired.        Judgment: Judgment normal.    ROS Blood pressure (!) 156/83, pulse 77, temperature 97.6 F (36.4 C), temperature source Oral, resp. rate 18, height 5\' 3"  (1.6 m), weight 128.4 kg, SpO2 98 %. Body mass index is 50.13 kg/m.   Demographic Factors:  Adolescent  or young adult and Caucasian  Loss Factors: NA  Historical Factors: Impulsivity  Risk Reduction Factors:   Sense of responsibility to family, Living with another person, especially a relative, Positive social support and Positive therapeutic relationship  Continued Clinical Symptoms:  Anxiety, mild  Cognitive Features That Contribute To Risk:  None    Suicide Risk:  Minimal: No  identifiable suicidal ideation.  Patients presenting with no risk factors but with morbid ruminations; may be classified as minimal risk based on the severity of the depressive symptoms   Plan Of Care/Follow-up recommendations:  Schizoaffective disorder, bipolar type: -Continue Lithium 900 mg at bedtime -Continue Tegretol 400 mg BID -Continue Seroquel 25 mg TID -Continue Seroquel 400 mg at bedtime -Continue Trinza injection every 3 months  Anxiety: -Continue Ativan 2 mg daily and 2 mg PRN -Continue Klonopin 1 mg TID  Severe agitation: -Started Geodon 20 mg once daily PRN   Activity:  as tolerated Diet:  heart healthy diet  Disposition: discharge to group home Nanine Means, NP 09/19/2020, 4:39 PM

## 2020-09-19 NOTE — ED Notes (Signed)
Psych and TTS at bedside. 

## 2020-09-19 NOTE — ED Notes (Signed)
Pt given meal tray.

## 2020-09-19 NOTE — Consult Note (Signed)
[facility]   Reason for Consult:  Psych evaluation Referring Physician:  EDP Patient Identification: Dawn Lambert Czarnecki MRN:  [id number] Principal Diagnosis: Suicidal ideations Diagnosis:  Principal Problem:   Suicidal ideations Active Problems:   Adjustment disorder with mixed disturbance of emotions and conduct   Intellectual disability   Total Time spent with patient: 1 hour  Subjective:   Dawn Lambert Boy is a 30 y.o. female patient admitted, per triage nurse, pt to ED via [facility] under IVC- pt states she is under IVC for "trespassing and laying the road"- pt states she wants to be in a locked facility   HPI:   History of Present Illness   Patient Identification Dawn Lambert Beeks is a 30 y.o. female.   Patient information was obtained from patient. History/Exam limitations: mental status. Patient presented involuntarily on transportation hold to the Emergency Department by BDP.    Chief Complaint  IVC 30 y.o., female presented to ED Under IVC by BDP with complaints of SI.  Patient seen by this provider, chart reviewed and appreciated and consulted with EDP and nursing staff.   Medications will be started for patient stabilized.  Today patient is alert/oriented, calm/cooperative.   Patient presents for psychiatric evaluation and requires medical clearance exam. Patient is brought by [facility]DP. She is placed on Mental Health Hold and is not requiring physical restraint. Patient requires psychiatric evaluation with concern for suicidal ideation, anxiety and running away. She has associated symptoms including poor appetite which began about a few month ago. Patient was not referred by a therapist or psychiatrist. Patient has a history of bipolar and IDD, Suicidal attempts/ideation and adjustment disorder for which she has been hospitalized a few times. The last physical exam was within the past year.   Patient complains of depression, suicidal thoughts/threats and suicide  attempt to lay on road to get run over by a car or truck. Patient states, " I was laying in the road because I wanted to die." Onset of symptoms was gradual starting several year ago. Symptoms are of severe severity and are gradually worsening. Patient states symptoms have been exacerbated by patient states that she thinks she needs new medication. Symptoms are associated with intent to harm self threats to jump in front of motor vehicle and has means to carry out plan and past psychiatric history: suicidal ideation, anxiety, depression and running away. History obtained from: patient and chart review.  Care prior to arrival consisted of nothing, with no relief.   Past Medical History:  Diagnosis Date  . Anxiety   . Arnold-Chiari malformation (HCC)   . Atrial septal defect   . Complication of anesthesia    when pt was 11, woke up during surgery (muscle biopsy)  . Constipation   . Contraception management   . Dandruff   . Dermatomyositis (HCC)    age 89  . Diabetes mellitus without complication (HCC)   . Elevated blood pressure   . Hypertension   . Hypothyroidism   . Mental retardation   . Mood disorder (HCC)   . Overweight(278.02)   . Pelvic pain   . Prediabetes    History reviewed. No pertinent family history. No current facility-administered medications for this encounter.   Current Outpatient Medications  Medication Sig Dispense Refill  . acetaminophen (TYLENOL) 325 MG tablet Take 650 mg by mouth every 6 (six) hours as needed for mild pain, moderate pain or fever.    [name]. atorvastatin (LIPITOR) 10 MG tablet Take 10 mg  by mouth daily.     . carbamazepine (TEGRETOL) 200 MG tablet Take 400 mg by mouth 2 (two) times daily.    . cholecalciferol (VITAMIN D) 1000 units tablet Take 1,000 Units by mouth daily.     . clonazePAM (KLONOPIN) 1 MG tablet Take 1 mg by mouth 3 (three) times daily.     . clotrimazole (LOTRIMIN) 1 % cream Apply 1 application topically 2 (two) times daily as needed  (yeast under the breasts).     . Desoximetasone 0.25 % LIQD Apply 1 application topically daily as needed (skin issue).    . DULoxetine (CYMBALTA) 20 MG capsule Take 1 capsule (20 mg total) by mouth daily for 21 days. 21 capsule 0  . Fluocinolone Acetonide Scalp (DERMA-SMOOTHE/FS SCALP) 0.01 % OIL APPLY TOPICALLY TO SCALP ONCE A WEEK OR UP TO 3 DAYS PER WEEK AS NEEDED FOR FLARES. 118.28 mL 10  . fluticasone (FLONASE) 50 MCG/ACT nasal spray Place 2 sprays into the nose daily as needed for allergies.     [name] gabapentin (NEURONTIN) 600 MG tablet Take 600 mg by mouth 3 (three) times daily.    [name] levothyroxine (SYNTHROID, LEVOTHROID) 175 MCG tablet Take 175 mcg by mouth daily before breakfast.    . lidocaine (XYLOCAINE) 2 % solution Use as directed 15 mLs in the mouth or throat as needed for mouth pain. 100 mL 0  . lisinopril (PRINIVIL,ZESTRIL) 20 MG tablet Take 20 mg by mouth daily.     [name] lithium carbonate 300 MG capsule Take 900 mg by mouth at bedtime.  (Patient not taking: No sig reported)    . LORazepam (ATIVAN) 2 MG tablet Take 2 mg by mouth daily.    . magnesium hydroxide (MILK OF MAGNESIA) 400 MG/5ML suspension Take 30 mLs by mouth at bedtime as needed for moderate constipation.  (Patient not taking: No sig reported)    . medroxyPROGESTERone (DEPO-PROVERA) 150 MG/ML injection Inject 150 mg into the muscle every 3 (three) months.     . metFORMIN (GLUCOPHAGE) 500 MG tablet Take 1,000 mg by mouth 2 (two) times daily.     . mometasone (ELOCON) 0.1 % cream Apply 1 application topically daily as needed (Itching of the ears).  (Patient not taking: No sig reported)    . Multiple Vitamin (THEREMS PO) Take 1 tablet by mouth daily. (Patient not taking: No sig reported)    . Omega-3 1000 MG CAPS Take 1,000 mg by mouth 2 (two) times daily. (Patient not taking: No sig reported)    . Paliperidone Palmitate ER (INVEGA TRINZA) 819 MG/2.625ML injection Inject 819 mg into the muscle every 3 (three) months. (Patient not  taking: No sig reported)    . propranolol (INDERAL) 40 MG tablet Take 40 mg by mouth 2 (two) times daily.    . QUEtiapine (SEROQUEL XR) 400 MG 24 hr tablet Take 1 tablet (400 mg total) by mouth at bedtime. 60 tablet 2  . QUEtiapine (SEROQUEL) 25 MG tablet Take 1 tablet (25 mg total) by mouth 3 (three) times daily with meals. 90 tablet 2   Allergies  Allergen Reactions  . Augmentin [Amoxicillin-Pot Clavulanate] Other (See Comments)    Has patient had a PCN reaction causing immediate rash, facial/tongue/throat swelling, SOB or lightheadedness with hypotension: ______ Has patient had a PCN reaction causing severe rash involving mucus membranes or skin necrosis: _______ Has patient had a PCN reaction that required hospitalization: _______ Has patient had a PCN reaction occurring within the last 10 years:_______ If all  of the above answers are "NO", then may proceed with Cephalosporin use.    Social History   Socioeconomic History  . Marital status: Single    Spouse name: Not on file  . Number of children: Not on file  . Years of education: Not on file  . Highest education level: Not on file  Occupational History  . Not on file  Tobacco Use  . Smoking status: Never Smoker  . Smokeless tobacco: Never Used  Vaping Use  . Vaping Use: Never used  Substance and Sexual Activity  . Alcohol use: No  . Drug use: No  . Sexual activity: Not on file  Other Topics Concern  . Not on file  Social History Narrative  . Not on file   Social Determinants of Health   Financial Resource Strain: Not on file  Food Insecurity: Not on file  Transportation Needs: Not on file  Physical Activity: Not on file  Stress: Not on file  Social Connections: Not on file  Intimate Partner Violence: Not on file   Risk to Self:   Risk to Others:   Prior Inpatient Therapy:   Prior Outpatient Therapy:    Past Medical History:  Past Medical History:  Diagnosis Date  . Anxiety   . Arnold-Chiari malformation  (HCC)   . Atrial septal defect   . Complication of anesthesia    when pt was 11, woke up during surgery (muscle biopsy)  . Constipation   . Contraception management   . Dandruff   . Dermatomyositis (HCC)    age 38  . Diabetes mellitus without complication (HCC)   . Elevated blood pressure   . Hypertension   . Hypothyroidism   . Mental retardation   . Mood disorder (HCC)   . Overweight(278.02)   . Pelvic pain   . Prediabetes     Past Surgical History:  Procedure Laterality Date  . ASD REPAIR     at age 49  . UMBILICAL HERNIA REPAIR N/A 09/17/2017   Procedure: HERNIA REPAIR UMBILICAL ADULT;  Surgeon: Earline Mayotte, MD;  Location: [facility] ORS;  Service: General;  Laterality: N/A;   Family History: History reviewed. No pertinent family history. Family Psychiatric  History: unknown Social History:  Social History   Substance and Sexual Activity  Alcohol Use No     Social History   Substance and Sexual Activity  Drug Use No    Social History   Socioeconomic History  . Marital status: Single    Spouse name: Not on file  . Number of children: Not on file  . Years of education: Not on file  . Highest education level: Not on file  Occupational History  . Not on file  Tobacco Use  . Smoking status: Never Smoker  . Smokeless tobacco: Never Used  Vaping Use  . Vaping Use: Never used  Substance and Sexual Activity  . Alcohol use: No  . Drug use: No  . Sexual activity: Not on file  Other Topics Concern  . Not on file  Social History Narrative  . Not on file   Social Determinants of Health   Financial Resource Strain: Not on file  Food Insecurity: Not on file  Transportation Needs: Not on file  Physical Activity: Not on file  Stress: Not on file  Social Connections: Not on file   Additional Social History:    Allergies:   Allergies  Allergen Reactions  . Augmentin [Amoxicillin-Pot Clavulanate] Other (See Comments)  Has patient had a PCN reaction causing  immediate rash, facial/tongue/throat swelling, SOB or lightheadedness with hypotension: ______ Has patient had a PCN reaction causing severe rash involving mucus membranes or skin necrosis: _______ Has patient had a PCN reaction that required hospitalization: _______ Has patient had a PCN reaction occurring within the last 10 years:_______ If all of the above answers are "NO", then may proceed with Cephalosporin use.     Labs:  Results for orders placed or performed during the hospital encounter of 09/17/20 (from the past 48 hour(Lambert))  Resp Panel by RT-PCR (Flu A&B, Covid) Nasopharyngeal Swab     Status: None   Collection Time: 09/17/20  2:43 PM   Specimen: Nasopharyngeal Swab; Nasopharyngeal(NP) swabs in vial transport medium  Result Value Ref Range   SARS Coronavirus 2 by RT PCR NEGATIVE NEGATIVE    Comment: (NOTE) SARS-CoV-2 target nucleic acids are NOT DETECTED.  The SARS-CoV-2 RNA is generally detectable in upper respiratory specimens during the acute phase of infection. The lowest concentration of SARS-CoV-2 viral copies this assay can detect is 138 copies/mL. A negative result does not preclude SARS-Cov-2 infection and should not be used as the sole basis for treatment or other patient management decisions. A negative result may occur with  improper specimen collection/handling, submission of specimen other than nasopharyngeal swab, presence of viral mutation(Lambert) within the areas targeted by this assay, and inadequate number of viral copies(<138 copies/mL). A negative result must be combined with clinical observations, patient history, and epidemiological information. The expected result is Negative.  Fact Sheet for Patients:  BloggerCourse.com  Fact Sheet for Healthcare Providers:  SeriousBroker.it  This test is no t yet approved or cleared by the [place] FDA and  has been authorized for detection and/or diagnosis of  SARS-CoV-2 by FDA under an Emergency Use Authorization (EUA). This EUA will remain  in effect (meaning this test can be used) for the duration of the COVID-19 declaration under Section 564(b)(1) of the Act, 21 U.Lambert.C.section 360bbb-3(b)(1), unless the authorization is terminated  or revoked sooner.       Influenza A by PCR NEGATIVE NEGATIVE   Influenza B by PCR NEGATIVE NEGATIVE    Comment: (NOTE) The Xpert Xpress SARS-CoV-2/FLU/RSV plus assay is intended as an aid in the diagnosis of influenza from Nasopharyngeal swab specimens and should not be used as a sole basis for treatment. Nasal washings and aspirates are unacceptable for Xpert Xpress SARS-CoV-2/FLU/RSV testing.  Fact Sheet for Patients: BloggerCourse.com  Fact Sheet for Healthcare Providers: SeriousBroker.it  This test is not yet approved or cleared by the [place] FDA and has been authorized for detection and/or diagnosis of SARS-CoV-2 by FDA under an Emergency Use Authorization (EUA). This EUA will remain in effect (meaning this test can be used) for the duration of the COVID-19 declaration under Section 564(b)(1) of the Act, 21 U.Lambert.C. section 360bbb-3(b)(1), unless the authorization is terminated or revoked.  Performed at [facility], [address] [place], [name] [address]     No current facility-administered medications for this encounter.   Current Outpatient Medications  Medication Sig Dispense Refill  . acetaminophen (TYLENOL) 325 MG tablet Take 650 mg by mouth every 6 (six) hours as needed for mild pain, moderate pain or fever.    [name] atorvastatin (LIPITOR) 10 MG tablet Take 10 mg by mouth daily.     . carbamazepine (TEGRETOL) 200 MG tablet Take 400 mg by mouth 2 (two) times daily.    . cholecalciferol (VITAMIN D)  1000 units tablet Take 1,000 Units by mouth daily.     . clonazePAM (KLONOPIN) 1 MG tablet Take 1 mg by mouth 3 (three) times  daily.     . clotrimazole (LOTRIMIN) 1 % cream Apply 1 application topically 2 (two) times daily as needed (yeast under the breasts).     . Desoximetasone 0.25 % LIQD Apply 1 application topically daily as needed (skin issue).    . DULoxetine (CYMBALTA) 20 MG capsule Take 1 capsule (20 mg total) by mouth daily for 21 days. 21 capsule 0  . Fluocinolone Acetonide Scalp (DERMA-SMOOTHE/FS SCALP) 0.01 % OIL APPLY TOPICALLY TO SCALP ONCE A WEEK OR UP TO 3 DAYS PER WEEK AS NEEDED FOR FLARES. 118.28 mL 10  . fluticasone (FLONASE) 50 MCG/ACT nasal spray Place 2 sprays into the nose daily as needed for allergies.     [name] gabapentin (NEURONTIN) 600 MG tablet Take 600 mg by mouth 3 (three) times daily.    [name] levothyroxine (SYNTHROID, LEVOTHROID) 175 MCG tablet Take 175 mcg by mouth daily before breakfast.    . lidocaine (XYLOCAINE) 2 % solution Use as directed 15 mLs in the mouth or throat as needed for mouth pain. 100 mL 0  . lisinopril (PRINIVIL,ZESTRIL) 20 MG tablet Take 20 mg by mouth daily.     [name] lithium carbonate 300 MG capsule Take 900 mg by mouth at bedtime.  (Patient not taking: No sig reported)    . LORazepam (ATIVAN) 2 MG tablet Take 2 mg by mouth daily.    . magnesium hydroxide (MILK OF MAGNESIA) 400 MG/5ML suspension Take 30 mLs by mouth at bedtime as needed for moderate constipation.  (Patient not taking: No sig reported)    . medroxyPROGESTERone (DEPO-PROVERA) 150 MG/ML injection Inject 150 mg into the muscle every 3 (three) months.     . metFORMIN (GLUCOPHAGE) 500 MG tablet Take 1,000 mg by mouth 2 (two) times daily.     . mometasone (ELOCON) 0.1 % cream Apply 1 application topically daily as needed (Itching of the ears).  (Patient not taking: No sig reported)    . Multiple Vitamin (THEREMS PO) Take 1 tablet by mouth daily. (Patient not taking: No sig reported)    . Omega-3 1000 MG CAPS Take 1,000 mg by mouth 2 (two) times daily. (Patient not taking: No sig reported)    . Paliperidone Palmitate ER  (INVEGA TRINZA) 819 MG/2.625ML injection Inject 819 mg into the muscle every 3 (three) months. (Patient not taking: No sig reported)    . propranolol (INDERAL) 40 MG tablet Take 40 mg by mouth 2 (two) times daily.    . QUEtiapine (SEROQUEL XR) 400 MG 24 hr tablet Take 1 tablet (400 mg total) by mouth at bedtime. 60 tablet 2  . QUEtiapine (SEROQUEL) 25 MG tablet Take 1 tablet (25 mg total) by mouth 3 (three) times daily with meals. 90 tablet 2    Musculoskeletal: Strength & Muscle Tone: within normal limits Gait & Station: normal Patient leans: N/A Psychiatric Specialty Exam:  Presentation  General Appearance: Appropriate for Environment  Eye Contact:Good  Speech:Clear and Coherent  Speech Volume:Normal  Handedness:Right   Mood and Affect  Mood:Euthymic  Affect:Appropriate; Congruent   Thought Process  Thought Processes:Coherent  Descriptions of Associations:Intact  Orientation:Full (Time, Place and Person)  Thought Content:Logical  History of Schizophrenia/Schizoaffective disorder:No data recorded Duration of Psychotic Symptoms:No data recorded Hallucinations:No data recorded Ideas of Reference:None  Suicidal Thoughts:No data recorded Homicidal Thoughts:No data recorded  Sensorium  Memory:Immediate  Good; Recent Good; Remote Good  Judgment:Good  Insight:Good   Executive Functions  Concentration:Good  Attention Span:Good  Recall:Good  Fund of Knowledge:Good  Language:Good   Psychomotor Activity  Psychomotor Activity:No data recorded  Assets  Assets:Communication Skills; Desire for Improvement; Resilience; Social Support   Sleep  Sleep:No data recorded  Physical Exam: Physical Exam Vitals and nursing note reviewed.    Review of Systems  Psychiatric/Behavioral: Positive for depression and suicidal ideas.  All other systems reviewed and are negative.  Blood pressure 130/81, pulse (!) 105, temperature 98.8 F (37.1 C), temperature  source Oral, resp. rate 18, height  (1.6 m), weight 128.4 kg, SpO2 97 %. Body mass index is 50.13 kg/m.  Treatment Plan Summary: Daily contact with patient to assess and evaluate symptoms and progress in treatment and Medication management  Disposition: Recommend psychiatric Inpatient admission when medically cleared. Supportive therapy provided about ongoing stressors. Discussed crisis plan, support from social network, calling 911, coming to the Emergency Department, and calling Suicide Hotline.  Jearld Lesch, NP 09/19/2020 4:03 AM

## 2020-09-19 NOTE — ED Notes (Signed)
Patient is resting comfortably. 

## 2020-10-22 ENCOUNTER — Emergency Department
Admission: EM | Admit: 2020-10-22 | Discharge: 2020-10-22 | Disposition: A | Discharge status: Home / Self Care | Payer: Medicaid Other | Attending: Emergency Medicine | Admitting: Emergency Medicine

## 2020-10-22 ENCOUNTER — Other Ambulatory Visit: Payer: Self-pay

## 2020-10-22 DIAGNOSIS — E039 Hypothyroidism, unspecified: Secondary | ICD-10-CM | POA: Insufficient documentation

## 2020-10-22 DIAGNOSIS — Z7984 Long term (current) use of oral hypoglycemic drugs: Secondary | ICD-10-CM | POA: Insufficient documentation

## 2020-10-22 DIAGNOSIS — E119 Type 2 diabetes mellitus without complications: Secondary | ICD-10-CM | POA: Diagnosis not present

## 2020-10-22 DIAGNOSIS — Z79899 Other long term (current) drug therapy: Secondary | ICD-10-CM | POA: Insufficient documentation

## 2020-10-22 DIAGNOSIS — R45851 Suicidal ideations: Secondary | ICD-10-CM | POA: Diagnosis not present

## 2020-10-22 DIAGNOSIS — Z046 Encounter for general psychiatric examination, requested by authority: Secondary | ICD-10-CM | POA: Diagnosis not present

## 2020-10-22 DIAGNOSIS — I10 Essential (primary) hypertension: Secondary | ICD-10-CM | POA: Diagnosis not present

## 2020-10-22 DIAGNOSIS — Y900 Blood alcohol level of less than 20 mg/100 ml: Secondary | ICD-10-CM | POA: Insufficient documentation

## 2020-10-22 DIAGNOSIS — F4325 Adjustment disorder with mixed disturbance of emotions and conduct: Secondary | ICD-10-CM | POA: Diagnosis present

## 2020-10-22 LAB — CBC
HCT: 33.1 % — ABNORMAL LOW (ref 36.0–46.0)
Hemoglobin: 10.7 g/dL — ABNORMAL LOW (ref 12.0–15.0)
MCH: 29.1 pg (ref 26.0–34.0)
MCHC: 32.3 g/dL (ref 30.0–36.0)
MCV: 89.9 fL (ref 80.0–100.0)
Platelets: 257 10*3/uL (ref 150–400)
RBC: 3.68 MIL/uL — ABNORMAL LOW (ref 3.87–5.11)
RDW: 13.1 % (ref 11.5–15.5)
WBC: 10.5 10*3/uL (ref 4.0–10.5)
nRBC: 0 % (ref 0.0–0.2)

## 2020-10-22 LAB — COMPREHENSIVE METABOLIC PANEL
ALT: 20 U/L (ref 0–44)
AST: 26 U/L (ref 15–41)
Albumin: 4 g/dL (ref 3.5–5.0)
Alkaline Phosphatase: 67 U/L (ref 38–126)
Anion gap: 9 (ref 5–15)
BUN: 13 mg/dL (ref 6–20)
CO2: 22 mmol/L (ref 22–32)
Calcium: 8.9 mg/dL (ref 8.9–10.3)
Chloride: 107 mmol/L (ref 98–111)
Creatinine, Ser: 0.85 mg/dL (ref 0.44–1.00)
GFR, Estimated: >60 mL/min (ref >60)
Glucose, Bld: 171 mg/dL — ABNORMAL HIGH (ref 70–99)
Potassium: 3.8 mmol/L (ref 3.5–5.1)
Sodium: 138 mmol/L (ref 135–145)
Total Bilirubin: 0.5 mg/dL (ref 0.3–1.2)
Total Protein: 7.6 g/dL (ref 6.5–8.1)

## 2020-10-22 LAB — ACETAMINOPHEN LEVEL: Acetaminophen (Tylenol), Serum: <10 ug/mL — ABNORMAL LOW (ref 10–30)

## 2020-10-22 LAB — SALICYLATE LEVEL: Salicylate Lvl: <7 mg/dL — ABNORMAL LOW (ref 7.0–30.0)

## 2020-10-22 LAB — ETHANOL: Alcohol, Ethyl (B): <10 mg/dL (ref <10)

## 2020-10-22 NOTE — ED Notes (Signed)
This RN contacted Dawn Lambert at group home who verbalized they would be sending someone to pick patient up from emergency room.

## 2020-10-22 NOTE — ED Notes (Signed)
Group home staff member here to pick up patient. E-signature not working at this time. Pt and cargiver verbalized understanding of D/C instructions, prescriptions and follow up care with no further questions at this time. Pt in NAD and ambulatory at time of D/C.

## 2020-10-22 NOTE — Consult Note (Signed)
[facility] ED Discharge  10/22/2020 5:25 PM Dawn Lambert  MRN:  [id number]  Method of visit?: Face to Face   Principal Problem: Adjustment disorder with mixed disturbance of emotions and conduct Discharge Diagnoses: Principal Problem:   Adjustment disorder with mixed disturbance of emotions and conduct   Subjective: "I felt suicidal and homicidal and laid in the road."  30 yo female presented to the ED after getting upset at her group home and "wanted to prove to them I could leave."  She left the group home and went into the road and laid down.  Dawn Lambert was calm and cooperative arriving to the ED and stayed calm and cooperative.  Earlier, she reports, "I had a bad week."  On assessment, she reports "I wish I could move back out.  I get bored."  When asked about the incident, she stated she wanted to prove to them I could leave.  "I thought they were going to stop me but said 'I have more things to do, so I left."  Discussed life at the group home and she has her own room, there are two other clients there she likes, the food "is pretty good".  Then she stated she wanted a new place to live.  This provider explained to her that this would be up to her guardian which is her mother, not the ED.  She said she understood.  This provider asked if she needed to stay overnight to take a break and she requested she rather return to the group home.  This provider asked what she would do the next time she got upset or felt suicidal or homicidal, she would ask, "Can I talk to [name]?", who is her therapist and helps calm her.  She also wants to see if my [name] would give her Geodon when she gets upset, if possible.  This was ordered PRN for agitation last time and will follow up to see if it is not agreeable with her guardian or group home.  No suicidal/homicidal ideations, hallucinations, or substance abuse.  Psychiatrically stable for discharge.  Group home did not answer.  Her guardian and mother, Dawn Lambert, "This is her MO, this is [name].  It usually comes when someone tells her no.  Something very trivial can set her off and knows it is an ordeal when she does not get her way.  She's had a really good week and did a lot of things.  Sometimes it is to come to the hospital to get medication.  They are really good to her.  Most of the regular staff there are on vacation which may be some of this."  Based on this, the Geodon will not be explored as the guardian does NOT want her to have additional medicines.  Total Time spent with patient: 45 minutes  Past Psychiatric History: anxiety, mood disorder  Past Medical History:  Past Medical History:  Diagnosis Date   Anxiety    Arnold-Chiari malformation (HCC)    Atrial septal defect    Complication of anesthesia    when pt was 11, woke up during surgery (muscle biopsy)   Constipation    Contraception management    Dandruff    Dermatomyositis [facility])    age 33   Diabetes mellitus without complication (HCC)    Elevated blood pressure    Hypertension    Hypothyroidism    Mental retardation    Mood disorder (HCC)    Overweight(278.02)  Pelvic pain    Prediabetes     Past Surgical History:  Procedure Laterality Date   ASD REPAIR     at age 60   UMBILICAL HERNIA REPAIR N/A 09/17/2017   Procedure: HERNIA REPAIR UMBILICAL ADULT;  Surgeon: Earline Mayotte, MD;  Location: [facility] ORS;  Service: General;  Laterality: N/A;   Family History: History reviewed. No pertinent family history. Family Psychiatric  History: none Social History:  Social History   Substance and Sexual Activity  Alcohol Use No     Social History   Substance and Sexual Activity  Drug Use No    Social History   Socioeconomic History   Marital status: Single    Spouse name: Not on file   Number of children: Not on file   Years of education: Not on file   Highest education level: Not on file  Occupational History   Not on file  Tobacco Use   Smoking  status: Never   Smokeless tobacco: Never  Vaping Use   Vaping Use: Never used  Substance and Sexual Activity   Alcohol use: No   Drug use: No   Sexual activity: Not on file  Other Topics Concern   Not on file  Social History Narrative   Not on file   Social Determinants of Health   Financial Resource Strain: Not on file  Food Insecurity: Not on file  Transportation Needs: Not on file  Physical Activity: Not on file  Stress: Not on file  Social Connections: Not on file    Tobacco Cessation:  N/A, patient does not currently use tobacco products  Current Medications: No current facility-administered medications for this encounter.   Current Outpatient Medications  Medication Sig Dispense Refill   acetaminophen (TYLENOL) 325 MG tablet Take 650 mg by mouth every 6 (six) hours as needed for mild pain, moderate pain or fever.     atorvastatin (LIPITOR) 10 MG tablet Take 10 mg by mouth at bedtime.     carbamazepine (TEGRETOL) 200 MG tablet Take 400 mg by mouth 2 (two) times daily.     cholecalciferol (VITAMIN D) 1000 units tablet Take 1,000 Units by mouth daily.      clonazePAM (KLONOPIN) 1 MG tablet Take 1 mg by mouth 3 (three) times daily.      Fluocinolone Acetonide Scalp (DERMA-SMOOTHE/FS SCALP) 0.01 % OIL APPLY TOPICALLY TO SCALP ONCE A WEEK OR UP TO 3 DAYS PER WEEK AS NEEDED FOR FLARES. 118.28 mL 10   fluticasone (FLONASE) 50 MCG/ACT nasal spray Place 2 sprays into both nostrils daily as needed for allergies.     gabapentin (NEURONTIN) 600 MG tablet Take 600 mg by mouth 3 (three) times daily.     levothyroxine (SYNTHROID, LEVOTHROID) 175 MCG tablet Take 175 mcg by mouth daily before breakfast.     lidocaine (XYLOCAINE) 2 % solution Use as directed 15 mLs in the mouth or throat as needed for mouth pain. 100 mL 0   lisinopril (PRINIVIL,ZESTRIL) 20 MG tablet Take 20 mg by mouth at bedtime.     lithium carbonate 300 MG capsule Take 900 mg by mouth at bedtime.     LORazepam  (ATIVAN) 2 MG tablet Take 2 mg by mouth daily.     LORazepam (ATIVAN) 2 MG tablet Take 2 mg by mouth daily as needed for sedation.     magnesium hydroxide (MILK OF MAGNESIA) 400 MG/5ML suspension Take 30 mLs by mouth at bedtime as needed for moderate constipation.  medroxyPROGESTERone (DEPO-PROVERA) 150 MG/ML injection Inject 150 mg into the muscle every 3 (three) months.      metFORMIN (GLUCOPHAGE) 500 MG tablet Take 1,000 mg by mouth 2 (two) times daily.      mometasone (ELOCON) 0.1 % cream Apply 1 application topically daily as needed (Itching of the ears).     Multiple Vitamin (THEREMS PO) Take 1 tablet by mouth daily.     Paliperidone Palmitate ER (INVEGA TRINZA) 819 MG/2.625ML injection Inject 819 mg into the muscle every 3 (three) months.     propranolol (INDERAL) 40 MG tablet Take 40 mg by mouth 2 (two) times daily.     QUEtiapine (SEROQUEL XR) 400 MG 24 hr tablet Take 1 tablet (400 mg total) by mouth at bedtime. 60 tablet 2   QUEtiapine (SEROQUEL) 25 MG tablet Take 1 tablet (25 mg total) by mouth 3 (three) times daily with meals. 90 tablet 2   ziprasidone (GEODON) 20 MG capsule Take 1 capsule (20 mg total) by mouth once as needed (severe agitation). 30 capsule 1   PTA Medications: (Not in a hospital admission)   Musculoskeletal: Strength & Muscle Tone: within normal limits Gait & Station: normal Patient leans: N/A  Psychiatric Specialty Exam:  Presentation  General Appearance: Appropriate for Environment  Eye Contact:Good  Speech:Clear and Coherent  Speech Volume:Normal  Handedness:Right   Mood and Affect  Mood:  Anxious, mild Affect:Appropriate; Congruent   Thought Process  Thought Processes:Coherent  Descriptions of Associations:Intact  Orientation:Full (Time, Place and Person)  Thought Content:Logical  History of Schizophrenia/Schizoaffective disorder:  None Duration of Psychotic Symptoms:  None Hallucinations: None Ideas of  Reference:None  Suicidal Thoughts:  None Homicidal Thoughts:  None  Sensorium  Memory:Immediate Good; Recent Good; Remote Good  Judgment:  Fair Insight:  Fair  Risk managerxecutive Functions  Concentration:Good  Attention Span:Good  Recall:Good  Fund of Knowledge:  Fair Language:Good   Psychomotor Activity  Psychomotor Activity: WDL  Assets  Assets:Communication Skills; Desire for Improvement; Resilience; Social Support   Sleep  Sleep: "Good"  Physical Exam: Physical Exam Vitals and nursing note reviewed.  Constitutional:      Appearance: Normal appearance.  HENT:     Head: Normocephalic.     Nose: Nose normal.  Musculoskeletal:        General: Normal range of motion.     Cervical back: Normal range of motion.  Neurological:     General: No focal deficit present.     Mental Status: She is alert and oriented to person, place, and time.  Psychiatric:        Attention and Perception: Attention and perception normal.        Mood and Affect: Mood is anxious.        Speech: Speech normal.        Behavior: Behavior normal. Behavior is cooperative.        Thought Content: Thought content normal.        Cognition and Memory: Cognition is impaired.        Judgment: Judgment is impulsive.   Review of Systems  Psychiatric/Behavioral:  The patient is nervous/anxious.   All other systems reviewed and are negative. Blood pressure 113/71, pulse 89, temperature 99.8 F (37.7 C), temperature source Oral, resp. rate 20, height 5\' 3"  (1.6 m), weight 123.8 kg, SpO2 98 %. Body mass index is 48.36 kg/m.   Demographic Factors:  Adolescent or young adult and Caucasian  Loss Factors: NA  Historical Factors: Impulsivity  Risk Reduction Factors:  Sense of responsibility to family, Living with another person, especially a relative, Positive social support, and Positive therapeutic relationship  Continued Clinical Symptoms:  Anxiety, mild  Cognitive Features That Contribute To  Risk:  Intellectual disability  Suicide Risk:  Minimal: No identifiable suicidal ideation.  Patients presenting with no risk factors but with morbid ruminations; may be classified as minimal risk based on the severity of the depressive symptoms  Plan Of Care/Follow-up recommendations:  Schizoaffective disorder, bipolar type: -Continue Lithium 900 mg at bedtime -Continue Tegretol 400 mg BID -Continue Seroquel 25 mg TID -Continue Seroquel 400 mg at bedtime -Continue Trinza injection every 3 months   Anxiety: -Continue Ativan 2 mg daily and 2 mg PRN -Continue Klonopin 1 mg TID   Activity:  as tolerated Diet:  heart healthy diet   and diabetic diet  Disposition: discharge to group home Nanine Means, NP 10/22/2020, 5:25 PM

## 2020-10-22 NOTE — ED Notes (Signed)
Pt changed into BLUE scrubs due to not having correct sizing for pt in burgundy scrubs by this RN, and Hospital doctor, Charity fundraiser. Pt belongings include: smart watch, yellow ring with stone, striped dress, sandals. Belongings labeled and handed off to receiving RN.

## 2020-10-22 NOTE — ED Notes (Signed)
Papers rescinded, pending dispo

## 2020-10-22 NOTE — ED Provider Notes (Signed)
[facility] Emergency Department Provider Note   ____________________________________________   Event Date/Time   First MD Initiated Contact with Patient 10/22/20 1645     (approximate)  I have reviewed the triage vital signs and the nursing notes.   HISTORY  Chief Complaint Suicidal    HPI Dawn Lambert is a 30 y.o. female here for evaluation after laying in the road at her group home  Patient reports that she does not like her current group home she would like to find a new one.  Today she walked out and lay down in the road.  She denies any other attempt to harm her self or overdose.  She reports that she does not like where she is living and she went and laid in the road in order to "get killed" but as she discusses it more it seems more like it was an attempt to switch group homes than an actual suicidal attempt.  She seems like she is upset and not handling her current group home situation well.  Denies any injuries.  Denies any was trying to harm her.  No chest pain no trouble breathing no recent illness.  She has not yet had her evening medications she reports  Denies pregnancy or any chance of pregnancy Past Medical History:  Diagnosis Date   Anxiety    Arnold-Chiari malformation (HCC)    Atrial septal defect    Complication of anesthesia    when pt was 11, woke up during surgery (muscle biopsy)   Constipation    Contraception management    Dandruff    Dermatomyositis (HCC)    age 43   Diabetes mellitus without complication (HCC)    Elevated blood pressure    Hypertension    Hypothyroidism    Mental retardation    Mood disorder (HCC)    Overweight(278.02)    Pelvic pain    Prediabetes     Patient Active Problem List   Diagnosis Date Noted   Suicidal ideation 09/19/2020   Adjustment disorder with mixed disturbance of emotions and conduct 10/12/2017   Intellectual disability 10/12/2017   Umbilical hernia without obstruction  and without gangrene 08/15/2017   Chest pain 11/05/2012   Atrial septal defect 09/19/2011    Past Surgical History:  Procedure Laterality Date   ASD REPAIR     at age 6   UMBILICAL HERNIA REPAIR N/A 09/17/2017   Procedure: HERNIA REPAIR UMBILICAL ADULT;  Surgeon: Earline Mayotte, MD;  Location: [facility] ORS;  Service: General;  Laterality: N/A;    Prior to Admission medications   Medication Sig Start Date End Date Taking? Authorizing Provider  acetaminophen (TYLENOL) 325 MG tablet Take 650 mg by mouth every 6 (six) hours as needed for mild pain, moderate pain or fever.    [provider]  atorvastatin (LIPITOR) 10 MG tablet Take 10 mg by mouth at bedtime.    [provider]  carbamazepine (TEGRETOL) 200 MG tablet Take 400 mg by mouth 2 (two) times daily.    [provider]  cholecalciferol (VITAMIN D) 1000 units tablet Take 1,000 Units by mouth daily.     [provider]  clonazePAM (KLONOPIN) 1 MG tablet Take 1 mg by mouth 3 (three) times daily.     [provider]  Fluocinolone Acetonide Scalp (DERMA-SMOOTHE/FS SCALP) 0.01 % OIL APPLY TOPICALLY TO SCALP ONCE A WEEK OR UP TO 3 DAYS PER WEEK AS NEEDED FOR FLARES. 08/18/20   Deirdre Evener,  MD  fluticasone (FLONASE) 50 MCG/ACT nasal spray Place 2 sprays into both nostrils daily as needed for allergies.    [provider]  gabapentin (NEURONTIN) 600 MG tablet Take 600 mg by mouth 3 (three) times daily.    [provider]  levothyroxine (SYNTHROID, LEVOTHROID) 175 MCG tablet Take 175 mcg by mouth daily before breakfast.    [provider]  lidocaine (XYLOCAINE) 2 % solution Use as directed 15 mLs in the mouth or throat as needed for mouth pain. 07/04/20   Lucy Chris, PA  lisinopril (PRINIVIL,ZESTRIL) 20 MG tablet Take 20 mg by mouth at bedtime.    [provider]  lithium carbonate 300 MG capsule Take 900 mg by mouth at bedtime.    [provider]   LORazepam (ATIVAN) 2 MG tablet Take 2 mg by mouth daily.    [provider]  LORazepam (ATIVAN) 2 MG tablet Take 2 mg by mouth daily as needed for sedation.    [provider]  magnesium hydroxide (MILK OF MAGNESIA) 400 MG/5ML suspension Take 30 mLs by mouth at bedtime as needed for moderate constipation.    [provider]  medroxyPROGESTERone (DEPO-PROVERA) 150 MG/ML injection Inject 150 mg into the muscle every 3 (three) months.     [provider]  metFORMIN (GLUCOPHAGE) 500 MG tablet Take 1,000 mg by mouth 2 (two) times daily.     [provider]  mometasone (ELOCON) 0.1 % cream Apply 1 application topically daily as needed (Itching of the ears).    [provider]  Multiple Vitamin (THEREMS PO) Take 1 tablet by mouth daily.    [provider]  Paliperidone Palmitate ER (INVEGA TRINZA) 819 MG/2.625ML injection Inject 819 mg into the muscle every 3 (three) months.    [provider]  propranolol (INDERAL) 40 MG tablet Take 40 mg by mouth 2 (two) times daily.    [provider]  QUEtiapine (SEROQUEL XR) 400 MG 24 hr tablet Take 1 tablet (400 mg total) by mouth at bedtime. 10/08/18   Mariel Craft, MD  QUEtiapine (SEROQUEL) 25 MG tablet Take 1 tablet (25 mg total) by mouth 3 (three) times daily with meals. 10/08/18   Mariel Craft, MD  ziprasidone (GEODON) 20 MG capsule Take 1 capsule (20 mg total) by mouth once as needed (severe agitation). 09/19/20 10/22/20  Charm Rings, NP    Allergies Augmentin [amoxicillin-pot clavulanate]  History reviewed. No pertinent family history.  Social History Social History   Tobacco Use   Smoking status: Never   Smokeless tobacco: Never  Vaping Use   Vaping Use: Never used  Substance Use Topics   Alcohol use: No   Drug use: No    Review of Systems Constitutional: No fever/chills ENT: No sore throat. Cardiovascular: Denies chest pain. Respiratory: Denies  shortness of breath. Gastrointestinal: No abdominal pain.   Genitourinary: Negative for dysuria.  Denies pregnancy Musculoskeletal: No pain in her arms or legs.  No pain in the back. Skin: Negative for rash. Neurological: Negative for headaches    ____________________________________________   PHYSICAL EXAM:  VITAL SIGNS: ED Triage Vitals  Enc Vitals Group     BP 10/22/20 1544 113/71     Pulse Rate 10/22/20 1544 89     Resp 10/22/20 1544 20     Temp 10/22/20 1544 99.8 F (37.7 C)     Temp Source 10/22/20 1544 Oral     SpO2 10/22/20 1544 98 %  Weight 10/22/20 1544 273 lb (123.8 kg)     Height 10/22/20 1544 5\' 3"  (1.6 m)     Head Circumference --      Peak Flow --      Pain Score 10/22/20 1556 0     Pain Loc --      Pain Edu? --      Excl. in GC? --     Constitutional: Alert and oriented. Well appearing and in no acute distress.  She is resting calmly, sleeping on her stomach, sits up follows commands very conversant.  Shows no agitation. Eyes: Conjunctivae are normal. Head: Atraumatic. Nose: No congestion/rhinnorhea. Mouth/Throat: Mucous membranes are moist. Neck: No stridor.  Cardiovascular: Normal rate, regular rhythm. Grossly normal heart sounds.  Good peripheral circulation. Respiratory: Normal respiratory effort.  No retractions. Lungs CTAB. Gastrointestinal: Soft and nontender. No distention. Musculoskeletal: No lower extremity tenderness nor edema. Neurologic:  Normal speech and language. No gross focal neurologic deficits are appreciated.  Skin:  Skin is warm, dry and intact. No rash noted. Psychiatric: Mood and affect are somewhat flat, seems to have mild cognitive impairment to moderate cognitive impairment overall.  Consistent with mental retardation.  Reports suicide attempt because of not liking her current group home situation ____________________________________________   LABS (all labs ordered are listed, but only abnormal results are  displayed)  Labs Reviewed  COMPREHENSIVE METABOLIC PANEL - Abnormal; Notable for the following components:      Result Value   Glucose, Bld 171 (*)    All other components within normal limits  SALICYLATE LEVEL - Abnormal; Notable for the following components:   Salicylate Lvl <7.0 (*)    All other components within normal limits  ACETAMINOPHEN LEVEL - Abnormal; Notable for the following components:   Acetaminophen (Tylenol), Serum <10 (*)    All other components within normal limits  CBC - Abnormal; Notable for the following components:   RBC 3.68 (*)    Hemoglobin 10.7 (*)    HCT 33.1 (*)    All other components within normal limits  RESP PANEL BY RT-PCR (FLU A&B, COVID) ARPGX2  ETHANOL  URINE DRUG SCREEN, QUALITATIVE ([facility] ONLY)  POC URINE PREG, ED  POC URINE PREG, ED   ____________________________________________  EKG   ____________________________________________  RADIOLOGY   ____________________________________________   PROCEDURES  Procedure(s) performed: None  Procedures  Critical Care performed: No  ____________________________________________   INITIAL IMPRESSION / ASSESSMENT AND PLAN / ED COURSE  Pertinent labs & imaging results that were available during my care of the patient were reviewed by me and considered in my medical decision making (see chart for details).  Patient denies any pain or injury.  Reports not liking her current group home setting.  This apparently prompted her to go lay down on the street with a "suicide attempt".  Certainly this is concerning behavior, but I suspect this is more of an adjustment type disorder than actual true suicide attempt, though certainly she is placed now under IVC and we will have further consultation with our psychiatry team.  No evidence or complaint of acute medical illness.  Labs reassuring.   ----------------------------------------- 5:09 PM on  10/22/2020 ----------------------------------------- Medically cleared for psychiatric consult at this time      ____________________________________________   FINAL CLINICAL IMPRESSION(S) / ED DIAGNOSES  Final diagnoses:  Adjustment disorder with mixed disturbance of emotions and conduct        Note:  This document was prepared using Dragon voice recognition software and may include  unintentional dictation errors       Sharyn CreamerQuale, Nevan Creighton, MD 10/22/20 2216

## 2020-10-22 NOTE — ED Triage Notes (Signed)
Pt arrives IVC with [facility] from [facility] group home. Staff called 911 due to pt laying in the middle of the road and telling staff members "I'm suicidal" and "I was laying in the road because I want to die."

## 2020-10-22 NOTE — Discharge Instructions (Addendum)
Follow up with outpatient provider  You have been seen in the Emergency Department (ED) today for a psychiatric complaint.  You have been evaluated by psychiatry and we believe you are safe to be discharged from the hospital.    Please return to the ED immediately if you have ANY thoughts of hurting yourself or anyone else, so that we may help you.  Please avoid alcohol and drug use.  Follow up with your doctor and/or therapist as soon as possible regarding today's ED visit.   Please follow up any other recommendations and clinic appointments provided by the psychiatry team that saw you in the Emergency Department.  

## 2020-10-28 IMAGING — US US ABDOMEN COMPLETE
1 series · 13 of 25 positions shown · non-contrast
Comparison: None.

CLINICAL DATA: Elevated liver enzymes

EXAM:
ABDOMEN ULTRASOUND COMPLETE

[Series 1: us abdomen complete · 13 of 91 slices shown]
[im 1/91]
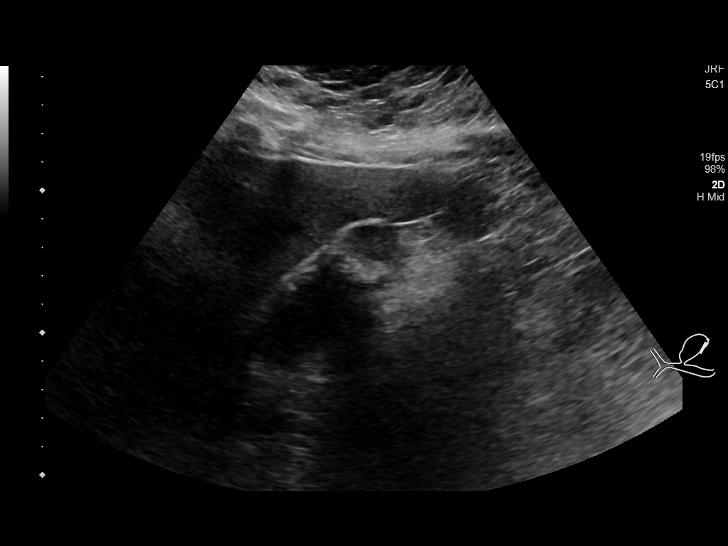
[im 8/91]
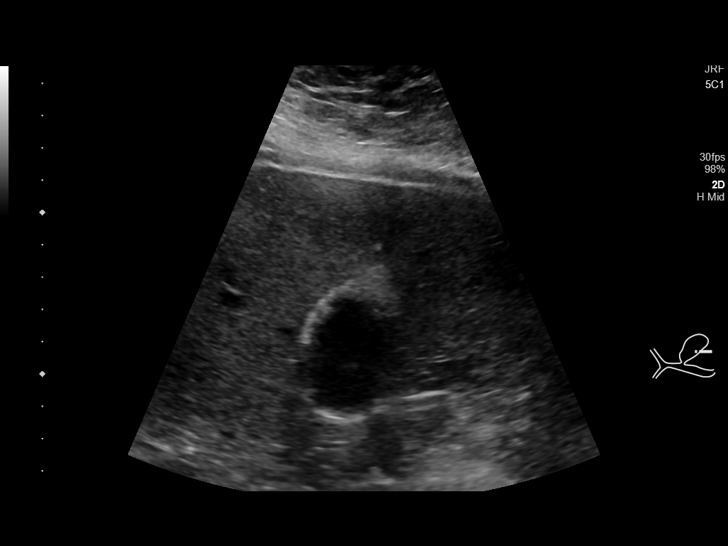
[im 16/91]
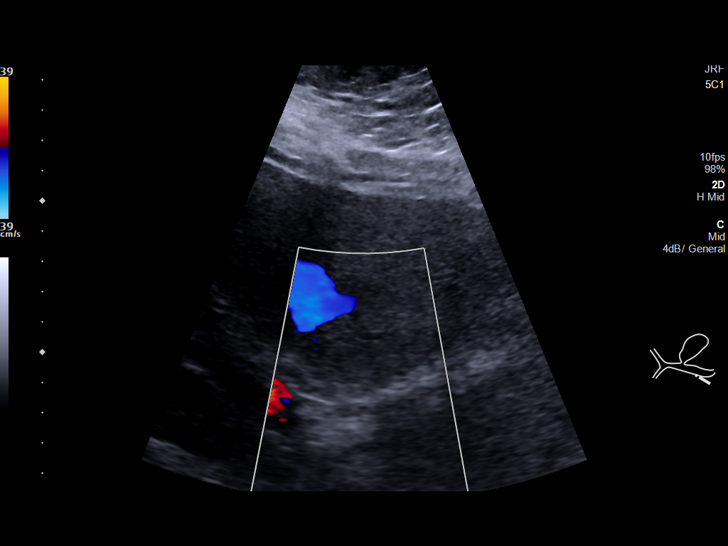
[im 23/91]
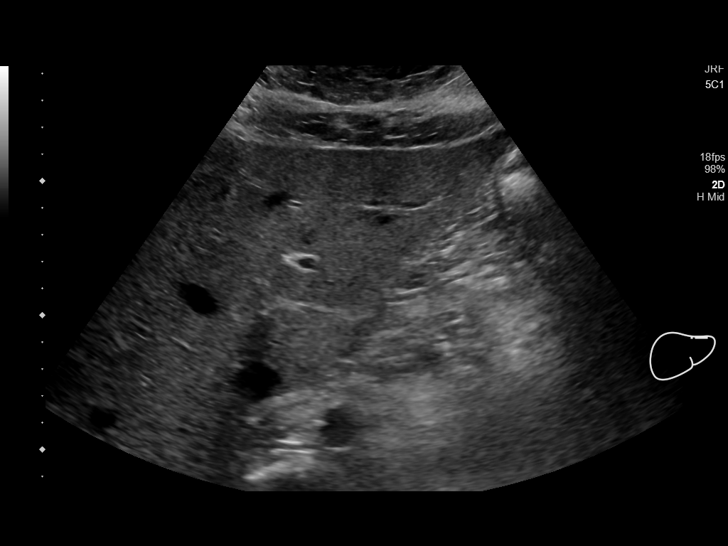
[im 31/91]
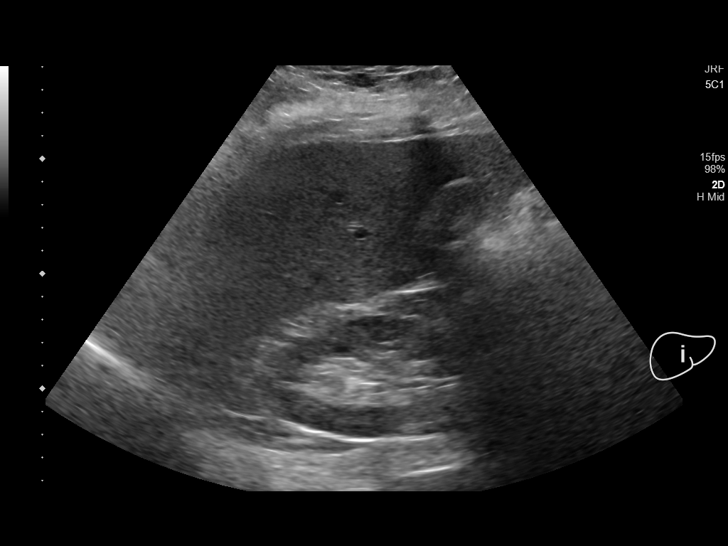
[im 38/91]
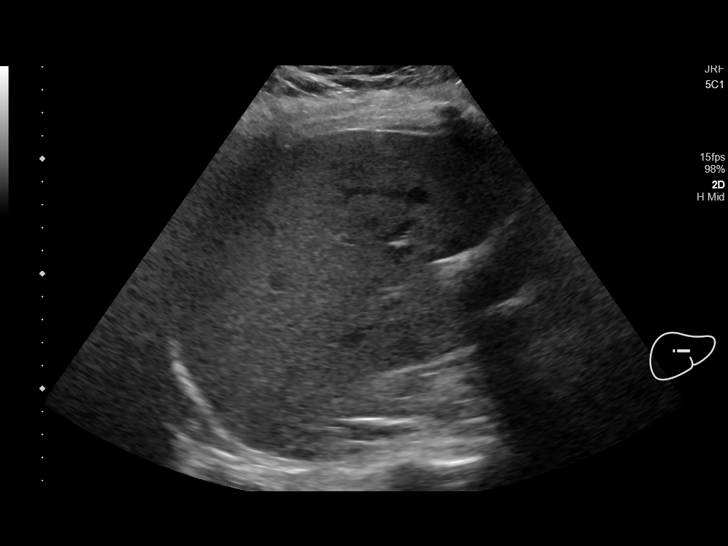
[im 46/91]
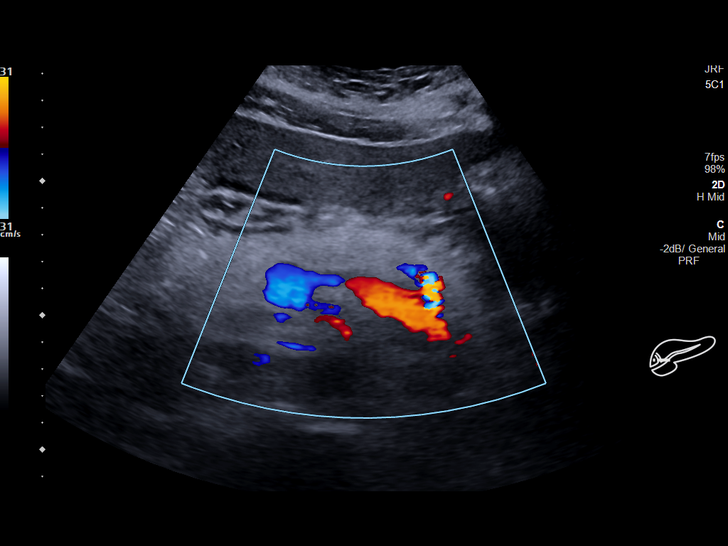
[im 53/91]
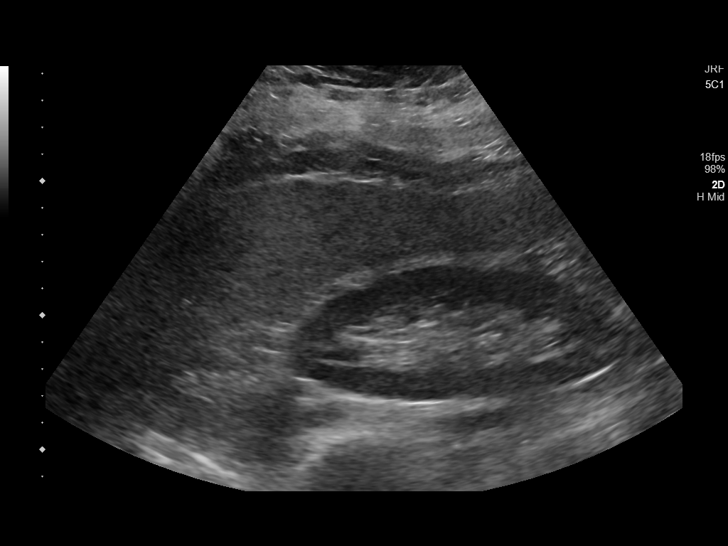
[im 61/91]
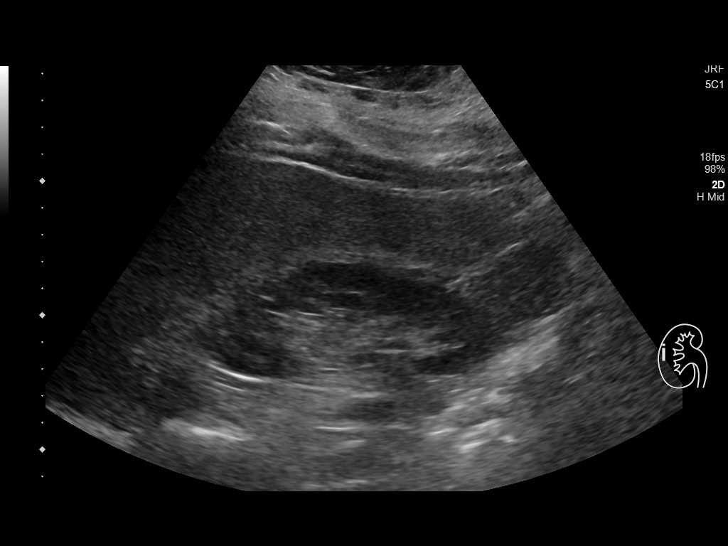
[im 68/91]
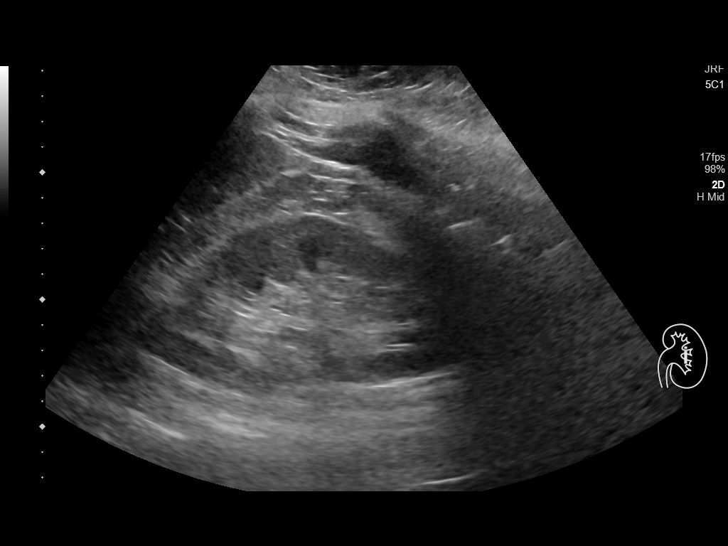
[im 76/91]
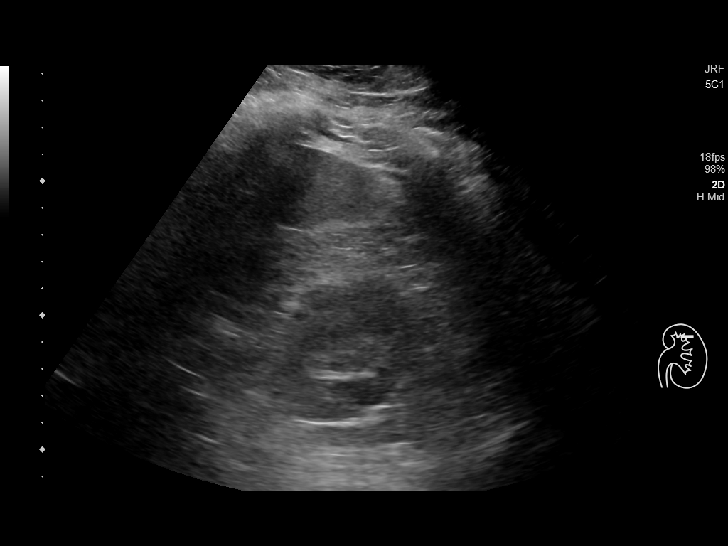
[im 83/91]
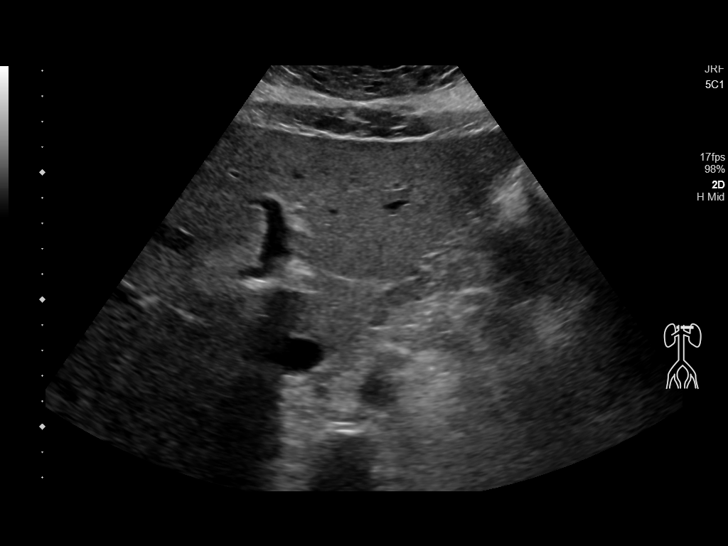
[im 91/91]
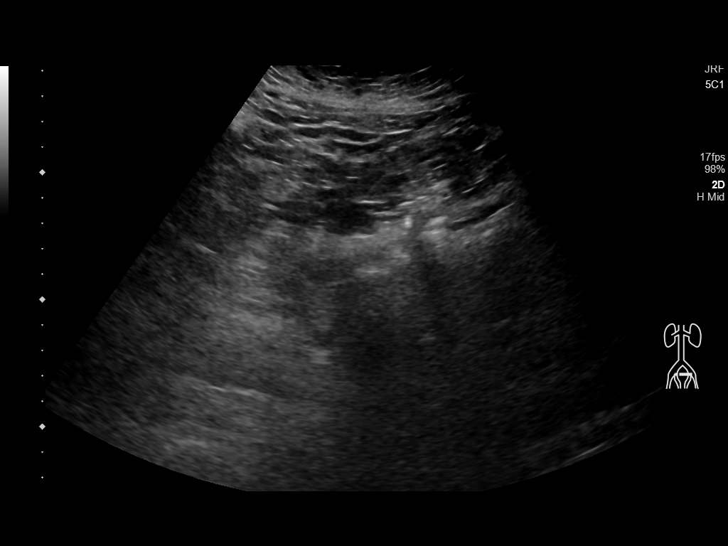

[13 of 25 positions shown; findings below may reference images not displayed]

FINDINGS: Gallbladder: Within the gallbladder, there are echogenic foci which
move and shadow consistent with cholelithiasis. Largest individual
gallstone measures 2.0 cm in length. There is no gallbladder wall
thickening or pericholecystic fluid. No sonographic Murphy sign
noted by sonographer.

Common bile duct: Diameter: 3 mm. No intrahepatic, common hepatic,
or common bile duct dilatation.

Liver: No focal lesion identified. Within normal limits in
parenchymal echogenicity. Portal vein is patent on color Doppler
imaging with normal direction of blood flow towards the liver.

IVC: No abnormality visualized.

Pancreas: No pancreatic mass or inflammatory focus.

Spleen: Size and appearance within normal limits.

Right Kidney: Length: 12.7 cm. Echogenicity within normal limits. No
mass or hydronephrosis visualized.

Left Kidney: Length: 12.9 cm. Echogenicity within normal limits. No
mass or hydronephrosis visualized.

Abdominal aorta: No aneurysm visualized.

Other findings: No demonstrable ascites.
IMPRESSION: Cholelithiasis. No gallbladder wall thickening or pericholecystic
fluid.

Study otherwise unremarkable.

## 2020-11-13 ENCOUNTER — Other Ambulatory Visit: Payer: Self-pay

## 2020-11-13 ENCOUNTER — Encounter: Payer: Self-pay | Admitting: Emergency Medicine

## 2020-11-13 ENCOUNTER — Emergency Department
Admission: EM | Admit: 2020-11-13 | Discharge: 2020-11-14 | Disposition: A | Discharge status: Home / Self Care | Payer: Medicaid Other | Attending: Emergency Medicine | Admitting: Emergency Medicine

## 2020-11-13 DIAGNOSIS — Z7984 Long term (current) use of oral hypoglycemic drugs: Secondary | ICD-10-CM | POA: Insufficient documentation

## 2020-11-13 DIAGNOSIS — I10 Essential (primary) hypertension: Secondary | ICD-10-CM | POA: Insufficient documentation

## 2020-11-13 DIAGNOSIS — Z79899 Other long term (current) drug therapy: Secondary | ICD-10-CM | POA: Insufficient documentation

## 2020-11-13 DIAGNOSIS — R45851 Suicidal ideations: Secondary | ICD-10-CM | POA: Insufficient documentation

## 2020-11-13 DIAGNOSIS — F430 Acute stress reaction: Secondary | ICD-10-CM | POA: Diagnosis not present

## 2020-11-13 DIAGNOSIS — R4589 Other symptoms and signs involving emotional state: Secondary | ICD-10-CM | POA: Diagnosis present

## 2020-11-13 DIAGNOSIS — F438 Other reactions to severe stress: Secondary | ICD-10-CM | POA: Insufficient documentation

## 2020-11-13 DIAGNOSIS — E119 Type 2 diabetes mellitus without complications: Secondary | ICD-10-CM | POA: Diagnosis not present

## 2020-11-13 DIAGNOSIS — E039 Hypothyroidism, unspecified: Secondary | ICD-10-CM | POA: Diagnosis not present

## 2020-11-13 LAB — CBC
HCT: 33.6 % — ABNORMAL LOW (ref 36.0–46.0)
Hemoglobin: 10.8 g/dL — ABNORMAL LOW (ref 12.0–15.0)
MCH: 29 pg (ref 26.0–34.0)
MCHC: 32.1 g/dL (ref 30.0–36.0)
MCV: 90.3 fL (ref 80.0–100.0)
Platelets: 270 10*3/uL (ref 150–400)
RBC: 3.72 MIL/uL — ABNORMAL LOW (ref 3.87–5.11)
RDW: 13.2 % (ref 11.5–15.5)
WBC: 12 10*3/uL — ABNORMAL HIGH (ref 4.0–10.5)
nRBC: 0 % (ref 0.0–0.2)

## 2020-11-13 LAB — CBG MONITORING, ED: Glucose-Capillary: 219 mg/dL — ABNORMAL HIGH (ref 70–99)

## 2020-11-13 LAB — COMPREHENSIVE METABOLIC PANEL
ALT: 20 U/L (ref 0–44)
AST: 27 U/L (ref 15–41)
Albumin: 3.9 g/dL (ref 3.5–5.0)
Alkaline Phosphatase: 64 U/L (ref 38–126)
Anion gap: 10 (ref 5–15)
BUN: 9 mg/dL (ref 6–20)
CO2: 23 mmol/L (ref 22–32)
Calcium: 9.7 mg/dL (ref 8.9–10.3)
Chloride: 105 mmol/L (ref 98–111)
Creatinine, Ser: 0.78 mg/dL (ref 0.44–1.00)
GFR, Estimated: >60 mL/min (ref >60)
Glucose, Bld: 225 mg/dL — ABNORMAL HIGH (ref 70–99)
Potassium: 3.8 mmol/L (ref 3.5–5.1)
Sodium: 138 mmol/L (ref 135–145)
Total Bilirubin: 0.4 mg/dL (ref 0.3–1.2)
Total Protein: 7.6 g/dL (ref 6.5–8.1)

## 2020-11-13 LAB — LITHIUM LEVEL: Lithium Lvl: 0.64 mmol/L (ref 0.60–1.20)

## 2020-11-13 LAB — ACETAMINOPHEN LEVEL: Acetaminophen (Tylenol), Serum: <10 ug/mL — ABNORMAL LOW (ref 10–30)

## 2020-11-13 MED ORDER — PROPRANOLOL HCL 20 MG PO TABS
40.0000 mg | ORAL_TABLET | Freq: Two times a day (BID) | ORAL | Status: DC
  Administered 2020-11-13 – 2020-11-14 (×2): 40 mg via ORAL
  Filled 2020-11-13 (×2): qty 2

## 2020-11-13 MED ORDER — ATORVASTATIN CALCIUM 20 MG PO TABS
10.0000 mg | ORAL_TABLET | Freq: Every day | ORAL | Status: DC
  Administered 2020-11-13: 10 mg via ORAL
  Filled 2020-11-13: qty 1

## 2020-11-13 MED ORDER — METFORMIN HCL 500 MG PO TABS
1000.0000 mg | ORAL_TABLET | Freq: Two times a day (BID) | ORAL | Status: DC
  Administered 2020-11-14: 1000 mg via ORAL
  Filled 2020-11-13: qty 2

## 2020-11-13 MED ORDER — LEVOTHYROXINE SODIUM 50 MCG PO TABS
175.0000 ug | ORAL_TABLET | Freq: Every day | ORAL | Status: DC
  Administered 2020-11-14: 175 ug via ORAL
  Filled 2020-11-13: qty 4

## 2020-11-13 MED ORDER — VITAMIN D 25 MCG (1000 UNIT) PO TABS: 1000.0000 [IU] | ORAL_TABLET | Freq: Every day | ORAL | Status: DC

## 2020-11-13 MED ORDER — GABAPENTIN 600 MG PO TABS
600.0000 mg | ORAL_TABLET | Freq: Three times a day (TID) | ORAL | Status: DC
  Administered 2020-11-13 – 2020-11-14 (×2): 600 mg via ORAL
  Filled 2020-11-13 (×2): qty 1

## 2020-11-13 MED ORDER — CARBAMAZEPINE 200 MG PO TABS
400.0000 mg | ORAL_TABLET | Freq: Two times a day (BID) | ORAL | Status: DC
  Administered 2020-11-13 – 2020-11-14 (×2): 400 mg via ORAL
  Filled 2020-11-13 (×2): qty 2

## 2020-11-13 MED ORDER — LEVOTHYROXINE SODIUM 50 MCG PO TABS: 175.0000 ug | ORAL_TABLET | Freq: Every day | ORAL | Status: DC

## 2020-11-13 MED ORDER — LITHIUM CARBONATE 300 MG PO CAPS
900.0000 mg | ORAL_CAPSULE | Freq: Every day | ORAL | Status: DC
  Administered 2020-11-13: 900 mg via ORAL
  Filled 2020-11-13 (×2): qty 3

## 2020-11-13 MED ORDER — LISINOPRIL 10 MG PO TABS
20.0000 mg | ORAL_TABLET | Freq: Every day | ORAL | Status: DC
  Administered 2020-11-13: 20 mg via ORAL
  Filled 2020-11-13: qty 2

## 2020-11-13 MED ORDER — METFORMIN HCL 500 MG PO TABS: 1000.0000 mg | ORAL_TABLET | Freq: Two times a day (BID) | ORAL | Status: DC

## 2020-11-13 MED ORDER — VITAMIN D 25 MCG (1000 UNIT) PO TABS
1000.0000 [IU] | ORAL_TABLET | Freq: Every day | ORAL | Status: DC
  Administered 2020-11-14: 1000 [IU] via ORAL
  Filled 2020-11-13: qty 1

## 2020-11-13 MED ORDER — QUETIAPINE FUMARATE 25 MG PO TABS
25.0000 mg | ORAL_TABLET | Freq: Three times a day (TID) | ORAL | Status: DC
  Administered 2020-11-14: 25 mg via ORAL
  Filled 2020-11-13: qty 1

## 2020-11-13 MED ORDER — CLONAZEPAM 0.5 MG PO TABS
1.0000 mg | ORAL_TABLET | Freq: Three times a day (TID) | ORAL | Status: DC
  Administered 2020-11-13 – 2020-11-14 (×2): 1 mg via ORAL
  Filled 2020-11-13 (×2): qty 2

## 2020-11-13 NOTE — ED Notes (Signed)
Patient had already put her pants back on. Patient agreed to put her shirt on and was placed in a room in the [facility]. Patient was cooperative with the move.

## 2020-11-13 NOTE — ED Notes (Signed)
PT WAS UNABLE TO PROVIDED W/ AN UA AT THIS MOMENT. WILL TRY LATER.

## 2020-11-13 NOTE — ED Notes (Signed)
Spoke with the pharmacy tech about the patient's concerns about getting her meds. Pharmacy tech states he called the group home earlier today and didn't get an answer and was unable to leave a message. Pharmacy tech states he will try again.

## 2020-11-13 NOTE — ED Notes (Signed)
Patient remains calm and cooperative. Patient was given a meal. Patient states she took her clothes off earlier because "when I go back, I'm just going to lay in the road again." Patient states she wants help. Dr. Cyril Loosen aware.

## 2020-11-13 NOTE — ED Notes (Signed)
Pt given lunch tray.

## 2020-11-13 NOTE — ED Provider Notes (Signed)
[facility] Emergency Department Provider Note   ____________________________________________    I have reviewed the triage vital signs and the nursing notes.   HISTORY  Chief Complaint Suicidal     HPI Dawn Lambert is a 30 y.o. female with past medical history as detailed below who presents with complaints of SI.  Patient was found lying on the side of the road near her group home, and someone called EMS.  She has not tried to harm herself in any other way.  She is unhappy with her group home.  She has presented with similar complaints in the past.  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 Authorizing Provider  acetaminophen (TYLENOL) 325 MG tablet Take 650 mg by mouth every 6 (six) hours as needed for mild pain, moderate pain  or fever.    [provider]  atorvastatin (LIPITOR) 10 MG tablet Take 10 mg by mouth at bedtime.    [provider]  carbamazepine (TEGRETOL) 200 MG tablet Take 400 mg by mouth 2 (two) times daily.    [provider]  cholecalciferol (VITAMIN D) 1000 units tablet Take 1,000 Units by mouth daily.     [provider]  clonazePAM (KLONOPIN) 1 MG tablet Take 1 mg by mouth 3 (three) times daily.     [provider]  Fluocinolone Acetonide Scalp (DERMA-SMOOTHE/FS SCALP) 0.01 % OIL APPLY TOPICALLY TO SCALP ONCE A WEEK OR UP TO 3 DAYS PER WEEK AS NEEDED FOR FLARES. 08/18/20   Deirdre Evener, MD  fluticasone [facility]) 50 MCG/ACT nasal spray Place 2 sprays into both nostrils daily as needed for allergies.    [provider]  gabapentin (NEURONTIN) 600 MG tablet Take 600 mg by mouth 3 (three) times daily.    [provider]  levothyroxine (SYNTHROID, LEVOTHROID) 175 MCG tablet Take 175 mcg by mouth daily before breakfast.    [provider]  lidocaine (XYLOCAINE) 2 % solution Use as directed 15 mLs in the mouth or throat as needed for mouth pain. 07/04/20   Lucy Chris, PA  lisinopril (PRINIVIL,ZESTRIL) 20 MG tablet Take 20 mg by mouth at bedtime.    [provider]  lithium carbonate 300 MG capsule Take 900 mg by mouth at bedtime.    [provider]  LORazepam (ATIVAN) 2 MG tablet Take 2 mg by mouth daily.    [provider]  LORazepam (ATIVAN) 2 MG tablet Take 2 mg by mouth daily as needed for sedation.    [provider]  magnesium hydroxide (MILK OF MAGNESIA) 400 MG/5ML suspension Take 30 mLs by mouth at bedtime as needed for moderate constipation.    [provider]  medroxyPROGESTERone (DEPO-PROVERA) 150 MG/ML injection Inject 150 mg into the muscle every 3 (three) months.     [provider]  metFORMIN (GLUCOPHAGE) 500 MG tablet Take 1,000 mg by mouth 2 (two) times daily.      [provider]  mometasone (ELOCON) 0.1 % cream Apply 1 application topically daily as needed (Itching of the ears).    [provider]  Multiple Vitamin (THEREMS PO) Take 1 tablet by mouth daily.    [provider]  Paliperidone Palmitate ER (INVEGA TRINZA) 819 MG/2.625ML injection Inject 819 mg into the muscle every 3 (three) months.    [provider]  propranolol (INDERAL) 40 MG tablet Take 40 mg by mouth 2 (two) times daily.    [provider]  QUEtiapine (SEROQUEL XR) 400 MG 24 hr tablet Take 1 tablet (400 mg total) by mouth at bedtime. 10/08/18   Mariel Craft, MD  QUEtiapine (SEROQUEL) 25 MG tablet Take 1 tablet (25 mg total) by mouth 3 (three) times daily with meals. 10/08/18   Mariel Craft, MD  ziprasidone (GEODON) 20 MG capsule Take 1 capsule (20 mg total) by mouth once as needed (severe agitation). 09/19/20 10/22/20  Charm Rings, NP     Allergies Augmentin [amoxicillin-pot clavulanate]  History reviewed. No pertinent family history.  Social History Social History   Tobacco Use   Smoking status: Never   Smokeless tobacco: Never  Vaping Use   Vaping Use: Never used  Substance Use Topics   Alcohol use: No   Drug use: No    Review of Systems  Constitutional: No fever/chills Eyes: No visual changes.  ENT: No sore throat. Cardiovascular: Denies chest pain. Respiratory: Denies shortness of breath. Gastrointestinal: No abdominal pain.  No nausea, no vomiting.   Genitourinary: Negative for dysuria. Musculoskeletal: Negative for back pain. Skin: Negative for rash. Neurological: Negative for headaches or weakness   ____________________________________________   PHYSICAL EXAM:  VITAL SIGNS: ED Triage Vitals [11/13/20 0914]  Enc Vitals Group     BP (!) 142/87     Pulse Rate 84     Resp (!) 22     Temp 98.9 F (37.2 C)     Temp Source Oral     SpO2 95 %     Weight 124 kg (273 lb 5.9 oz)     Height 1.6 m (5'  3")     Head Circumference      Peak Flow      Pain Score 0     Pain Loc      Pain Edu?      Excl. in GC?     Constitutional: Alert and oriented.   Mouth/Throat: Mucous membranes are moist.    Cardiovascular: Normal rate, regular rhythm. Grossly normal heart sounds.  Good peripheral circulation. Respiratory: Normal respiratory effort.  No retractions. Lungs CTAB. Gastrointestinal: Soft and nontender. No distention.    Musculoskeletal: No lower extremity tenderness nor edema.  Warm and well  perfused Neurologic:  Normal speech and language. No gross focal neurologic deficits are appreciated.  Skin:  Skin is warm, dry and intact. No rash noted. Psychiatric: Mood and affect are normal. Speech and behavior are normal.  ____________________________________________   LABS (all labs ordered are listed, but only abnormal results are displayed)  Labs Reviewed  CBG MONITORING, ED - Abnormal; Notable for the following components:      Result Value   Glucose-Capillary 219 (*)    All other components within normal limits  CBC  COMPREHENSIVE METABOLIC PANEL  ACETAMINOPHEN LEVEL  LITHIUM LEVEL   ____________________________________________  EKG  None ____________________________________________  RADIOLOGY   ____________________________________________   PROCEDURES  Procedure(s) performed: No  Procedures   Critical Care performed: No ____________________________________________   INITIAL IMPRESSION / ASSESSMENT AND PLAN / ED COURSE  Pertinent labs & imaging results that were available during my care of the patient were reviewed by me and considered in my medical decision making (see chart for details).   Patient well-appearing and in no acute distress.  Exam and lab work are reassuring, patient is medically cleared for psychiatric evaluation.  I gauge her to be low risk for actual self-harm  Patient seen by behavioral team, recommend discharge however we are being  told that group home cannot pick her up until tomorrow, we are working to expedite this to today    ____________________________________________   FINAL CLINICAL IMPRESSION(S) / ED DIAGNOSES  Final diagnoses:  None        Note:  This document was prepared using Dragon voice recognition software and may include unintentional dictation errors.    Jene Every, MD 11/13/20 [phone]

## 2020-11-13 NOTE — ED Notes (Signed)
Report given to Emily RN

## 2020-11-13 NOTE — ED Notes (Signed)
BREAKFAST TRAY GIVEN. NO OTHER NEEDS FOUND AT THIS MOMENT.

## 2020-11-13 NOTE — ED Notes (Signed)
Called and informed mom, legal guardian pt is here.  She will call group home and let them know as I was unable to reach them X 3

## 2020-11-13 NOTE — ED Notes (Signed)
[facility] AND COVID SWAB OBTAINED AND SENT TO LAB.  PT HAD AN "ACCIDENT". PT WAS PROVIDED WITH SHOWER SUPPLIES. PT TOOK A SHOWER AND CHANGED INTO [facility] SCRUBS. CBG OBTAINED. 219 mg/dL PT BELONGINGS INCLUDED: BLUE DRESS AND WHITE SANDALS.   No other needs found at this moment.

## 2020-11-13 NOTE — ED Notes (Signed)
Psych at bedside for eval

## 2020-11-13 NOTE — ED Notes (Signed)
Patient given ice water. Patient has not asked for anything else. Patient is concerned about her home meds. MD aware.

## 2020-11-13 NOTE — ED Notes (Addendum)
Informed james with group home that pt is here.  He states "she has been in a tizzy for past month and half and I need to call and escalate things because of her behaviors".  Informed him that will inform the psych team he would like to speak with them.  Loleta Dicker with group home at [phone]

## 2020-11-13 NOTE — ED Notes (Signed)
Pt given shower by NT while being dressed out.

## 2020-11-13 NOTE — Consult Note (Signed)
[facility] Psych ED Discharge  11/13/2020 12:26 PM Dawn Lambert  MRN:  [id number]  Method of visit?: Face to Face   Principal Problem: Stress reaction causing mixed disturbance of emotion and conduct Discharge Diagnoses: Principal Problem:   Stress reaction causing mixed disturbance of emotion and conduct   Subjective: Patient presents to the Emergency Department after lying in the street due to "wanting to end my life."  HPI: Patient with history of anxiety and IDD, presenting to the Emergency Department from her Group Home due to laying in the street during the day; has a history of doing the same when limitations are set on patient behavior. Patient is currently calm and cooperative, denies homicidal intent, delusions or hallucinations. Not responding to internal stimuli. Patient states still feeling upset and wanting her medications to be adjusted; patient consistently asks for medication adjustments when group home sets limitations on patient behavior. When patient informed she may have to return to the group home by emergency medical staff, patient disrobed "in protest." Upon collateral with group home, staff not concerned for suicidal or homicidal ideation; states that patient is acting out due to limitations set on behavior at the Group Home and privileges removed.    Upon collateral with guardian and Group Home, they requested for an overnight stay as she is still upset and will most likely return with the same behaviors of running into the street.  This was evident when she disrobed in the ED after the ED RN told her she would be returning to the ED.  She is agreeable to discharge tomorrow to return to her group home who will pick her up at 12 noon.  They are hoping this may curb her behaviors.  Total Time spent with patient: 1 hour  Past Psychiatric History: Anxiety, mood disorder, IDD.  Past Medical History:  Past Medical History:  Diagnosis Date   Anxiety    Arnold-Chiari  malformation (HCC)    Atrial septal defect    Complication of anesthesia    when pt was 11, woke up during surgery (muscle biopsy)   Constipation    Contraception management    Dandruff    Dermatomyositis [facility])    age 50   Diabetes mellitus without complication (HCC)    Elevated blood pressure    Hypertension    Hypothyroidism    Mental retardation    Mood disorder (HCC)    Overweight(278.02)    Pelvic pain    Prediabetes     Past Surgical History:  Procedure Laterality Date   ASD REPAIR     at age 10   UMBILICAL HERNIA REPAIR N/A 09/17/2017   Procedure: HERNIA REPAIR UMBILICAL ADULT;  Surgeon: Earline Mayotte, MD;  Location: [facility] ORS;  Service: General;  Laterality: N/A;   Family History: History reviewed. No pertinent family history. Family Psychiatric  History: History reviewed. No pertinent family psychiatric. Social History:  Social History   Substance and Sexual Activity  Alcohol Use No     Social History   Substance and Sexual Activity  Drug Use No    Social History   Socioeconomic History   Marital status: Single    Spouse name: Not on file   Number of children: Not on file   Years of education: Not on file   Highest education level: Not on file  Occupational History   Not on file  Tobacco Use   Smoking status: Never   Smokeless tobacco: Never  Vaping Use   Vaping  Use: Never used  Substance and Sexual Activity   Alcohol use: No   Drug use: No   Sexual activity: Not on file  Other Topics Concern   Not on file  Social History Narrative   Not on file   Social Determinants of Health   Financial Resource Strain: Not on file  Food Insecurity: Not on file  Transportation Needs: Not on file  Physical Activity: Not on file  Stress: Not on file  Social Connections: Not on file    Tobacco Cessation:  A prescription for an FDA-approved tobacco cessation medication was offered at discharge and the patient refused  Current Medications: No current  facility-administered medications for this encounter.   Current Outpatient Medications  Medication Sig Dispense Refill   acetaminophen (TYLENOL) 325 MG tablet Take 650 mg by mouth every 6 (six) hours as needed for mild pain, moderate pain or fever.     atorvastatin (LIPITOR) 10 MG tablet Take 10 mg by mouth at bedtime.     carbamazepine (TEGRETOL) 200 MG tablet Take 400 mg by mouth 2 (two) times daily.     cholecalciferol (VITAMIN D) 1000 units tablet Take 1,000 Units by mouth daily.      clonazePAM (KLONOPIN) 1 MG tablet Take 1 mg by mouth 3 (three) times daily.      Fluocinolone Acetonide Scalp (DERMA-SMOOTHE/FS SCALP) 0.01 % OIL APPLY TOPICALLY TO SCALP ONCE A WEEK OR UP TO 3 DAYS PER WEEK AS NEEDED FOR FLARES. 118.28 mL 10   fluticasone (FLONASE) 50 MCG/ACT nasal spray Place 2 sprays into both nostrils daily as needed for allergies.     gabapentin (NEURONTIN) 600 MG tablet Take 600 mg by mouth 3 (three) times daily.     levothyroxine (SYNTHROID, LEVOTHROID) 175 MCG tablet Take 175 mcg by mouth daily before breakfast.     lidocaine (XYLOCAINE) 2 % solution Use as directed 15 mLs in the mouth or throat as needed for mouth pain. 100 mL 0   lisinopril (PRINIVIL,ZESTRIL) 20 MG tablet Take 20 mg by mouth at bedtime.     lithium carbonate 300 MG capsule Take 900 mg by mouth at bedtime.     LORazepam (ATIVAN) 2 MG tablet Take 2 mg by mouth daily.     LORazepam (ATIVAN) 2 MG tablet Take 2 mg by mouth daily as needed for sedation.     magnesium hydroxide (MILK OF MAGNESIA) 400 MG/5ML suspension Take 30 mLs by mouth at bedtime as needed for moderate constipation.     medroxyPROGESTERone (DEPO-PROVERA) 150 MG/ML injection Inject 150 mg into the muscle every 3 (three) months.      metFORMIN (GLUCOPHAGE) 500 MG tablet Take 1,000 mg by mouth 2 (two) times daily.      mometasone (ELOCON) 0.1 % cream Apply 1 application topically daily as needed (Itching of the ears).     Multiple Vitamin (THEREMS PO) Take  1 tablet by mouth daily.     Paliperidone Palmitate ER (INVEGA TRINZA) 819 MG/2.625ML injection Inject 819 mg into the muscle every 3 (three) months.     propranolol (INDERAL) 40 MG tablet Take 40 mg by mouth 2 (two) times daily.     QUEtiapine (SEROQUEL XR) 400 MG 24 hr tablet Take 1 tablet (400 mg total) by mouth at bedtime. 60 tablet 2   QUEtiapine (SEROQUEL) 25 MG tablet Take 1 tablet (25 mg total) by mouth 3 (three) times daily with meals. 90 tablet 2   PTA Medications: (Not in a hospital admission)  Musculoskeletal: Strength & Muscle Tone: within normal limits Gait & Station: normal Patient leans: N/A  Psychiatric Specialty Exam:  Presentation  General Appearance: Appropriate for Environment  Eye Contact:Good  Speech:Clear and Coherent  Speech Volume:Normal  Handedness:Right   Mood and Affect  Mood:  Mild anxiety at times  Affect:Appropriate; Congruent   Thought Process  Thought Processes:Coherent  Descriptions of Associations:Intact  Orientation:Full (Time, Place and Person)  Thought Content:Logical  History of Schizophrenia/Schizoaffective disorder:  none Duration of Psychotic Symptoms: none Hallucinations: none Ideas of Reference:None  Suicidal Thoughts: none Homicidal Thoughts: none  Sensorium  Memory:Immediate Good; Recent Good; Remote Good  Judgment:Fair Insight:fair  Executive Functions  Concentration:Good  Attention Span:Good  Recall:Good  Fund of Knowledge:Good  Language:Good   Psychomotor Activity  Psychomotor Activity: WDL  Assets  Assets:Communication Skills; Desire for Improvement; Resilience; Social Support   Sleep  Sleep: Good  Physical Exam: Physical Exam Vitals and nursing note reviewed.  Constitutional:      Appearance: Normal appearance.  HENT:     Head: Normocephalic.     Nose: Nose normal.  Pulmonary:     Effort: Pulmonary effort is normal.  Musculoskeletal:        General: Normal range of motion.      Cervical back: Normal range of motion.  Neurological:     General: No focal deficit present.     Mental Status: She is alert and oriented to person, place, and time.  Psychiatric:        Attention and Perception: Attention and perception normal.        Mood and Affect: Affect is blunt.        Speech: Speech normal.        Behavior: Behavior normal. Behavior is cooperative.        Thought Content: Thought content normal.        Cognition and Memory: Cognition is impaired.        Judgment: Judgment is impulsive and inappropriate.   Review of Systems  Psychiatric/Behavioral:  The patient is nervous/anxious.   All other systems reviewed and are negative. Blood pressure (!) 142/87, pulse 84, temperature 98.9 F (37.2 C), temperature source Oral, resp. rate (!) 22, height 5\' 3"  (1.6 m), weight 124 kg, SpO2 95 %. Body mass index is 48.43 kg/m.   Demographic Factors:  NA  Loss Factors: NA  Historical Factors: Impulsivity  Risk Reduction Factors:   Living with another person, especially a relative and Positive social support  Continued Clinical Symptoms:  None  Cognitive Features That Contribute To Risk:  Polarized thinking    Suicide Risk:  Mild:  Suicidal ideation of limited frequency, intensity, duration, and specificity.  There are no identifiable plans, no associated intent, mild dysphoria and related symptoms, good self-control (both objective and subjective assessment), few other risk factors, and identifiable protective factors, including available and accessible social support.   Plan Of Care/Follow-up recommendations:  Activity: As tolerated. Diet: Regular.  Anxiety: Klonopin 1 mg TID Gabapentin 600 mg TID  Mood disorder: Seroquel XR: 400 mg at bedtime Seroquel: 25 mg TID with meals Lithium: 900 mg at bedtime  Insomnia: Ativan: 2 mg as needed for sleep   Disposition: Keep patient overnight in the Emergency Department for observation. If patient  without incident, discharge in good condition to Group Home with consent from primary guardian at 12 noon tomorrow, 7/17.  8/17, NP 11/13/2020, 12:26 PM

## 2020-11-13 NOTE — ED Notes (Signed)
Patient became agitated that she wasn't going home today and has taken all her clothes off. Patient is in a hallway bed, so screens were placed around the bed. Patient is refusing to put her clothes back on. Dr. Cyril Loosen and Richmond Campbell NP are aware.

## 2020-11-13 NOTE — ED Triage Notes (Signed)
Per EMS pt from [facility] group [facility].  Pt was found laying on ground by a bystander and EMS was called. Per EMS pt is suicidal.  Pt currently in bathroom with NT being dressed out and cleaning up from diarrhea.  Will talk with pt when out.

## 2020-11-14 NOTE — ED Notes (Signed)
Spoke with group home who states they will pass on to the next shift that pt should be picked up around 12 today.

## 2020-11-14 NOTE — ED Notes (Signed)
Pt given clothes back to change into. Group home outside. D/c paperwork given to group home.

## 2020-11-14 NOTE — ED Notes (Signed)
Pt taking shower, provided with clean scrubs.

## 2020-11-14 NOTE — ED Notes (Signed)
Called mother (legal guardian) about pt being d/c back to group home.

## 2020-11-14 NOTE — ED Provider Notes (Signed)
Emergency Medicine Observation Re-evaluation Note  Dawn Lambert is a 30 y.o. female, seen on rounds today.  Pt initially presented to the ED for complaints of Suicidal  Currently, the patient is calm, no acute complaints.  Physical Exam  Blood pressure (!) 141/93, pulse 90, temperature 98 F (36.7 C), temperature source Oral, resp. rate 18, height 5\' 3"  (1.6 m), weight 124 kg, SpO2 98 %. Physical Exam General: NAD Lungs: CTAB Psych: not agitated  ED Course / MDM  EKG:    I have reviewed the labs performed to date as well as medications administered while in observation.  Recent changes in the last 24 hours include no acute events overnight.    Plan  Current plan is for psych disposition. Patient is not under full IVC at this time.   , MD 11/14/20 [phone]

## 2020-11-14 NOTE — ED Notes (Signed)
Given breakfast tray. 

## 2020-11-14 NOTE — ED Notes (Signed)
VOL/pending possible dc to group home on 7/17.

## 2021-05-27 ENCOUNTER — Emergency Department
Admission: EM | Admit: 2021-05-27 | Discharge: 2021-05-28 | Disposition: A | Discharge status: Home / Self Care | Payer: Medicaid Other | Attending: Emergency Medicine | Admitting: Emergency Medicine

## 2021-05-27 ENCOUNTER — Other Ambulatory Visit: Payer: Self-pay

## 2021-05-27 ENCOUNTER — Encounter: Payer: Self-pay | Admitting: Emergency Medicine

## 2021-05-27 DIAGNOSIS — R45851 Suicidal ideations: Secondary | ICD-10-CM | POA: Diagnosis not present

## 2021-05-27 DIAGNOSIS — F430 Acute stress reaction: Secondary | ICD-10-CM | POA: Diagnosis not present

## 2021-05-27 DIAGNOSIS — Z20822 Contact with and (suspected) exposure to covid-19: Secondary | ICD-10-CM | POA: Diagnosis not present

## 2021-05-27 DIAGNOSIS — Y900 Blood alcohol level of less than 20 mg/100 ml: Secondary | ICD-10-CM | POA: Insufficient documentation

## 2021-05-27 DIAGNOSIS — Z046 Encounter for general psychiatric examination, requested by authority: Secondary | ICD-10-CM | POA: Diagnosis present

## 2021-05-27 LAB — COMPREHENSIVE METABOLIC PANEL
ALT: 20 U/L (ref 0–44)
AST: 17 U/L (ref 15–41)
Albumin: 3.8 g/dL (ref 3.5–5.0)
Alkaline Phosphatase: 70 U/L (ref 38–126)
Anion gap: 8 (ref 5–15)
BUN: 11 mg/dL (ref 6–20)
CO2: 25 mmol/L (ref 22–32)
Calcium: 8.7 mg/dL — ABNORMAL LOW (ref 8.9–10.3)
Chloride: 95 mmol/L — ABNORMAL LOW (ref 98–111)
Creatinine, Ser: 0.72 mg/dL (ref 0.44–1.00)
GFR, Estimated: >60 mL/min (ref >60)
Glucose, Bld: 101 mg/dL — ABNORMAL HIGH (ref 70–99)
Potassium: 3.7 mmol/L (ref 3.5–5.1)
Sodium: 128 mmol/L — ABNORMAL LOW (ref 135–145)
Total Bilirubin: 0.4 mg/dL (ref 0.3–1.2)
Total Protein: 7.4 g/dL (ref 6.5–8.1)

## 2021-05-27 LAB — CBC
HCT: 32.3 % — ABNORMAL LOW (ref 36.0–46.0)
Hemoglobin: 10.2 g/dL — ABNORMAL LOW (ref 12.0–15.0)
MCH: 29.1 pg (ref 26.0–34.0)
MCHC: 31.6 g/dL (ref 30.0–36.0)
MCV: 92 fL (ref 80.0–100.0)
Platelets: 299 10*3/uL (ref 150–400)
RBC: 3.51 MIL/uL — ABNORMAL LOW (ref 3.87–5.11)
RDW: 13.2 % (ref 11.5–15.5)
WBC: 11.3 10*3/uL — ABNORMAL HIGH (ref 4.0–10.5)
nRBC: 0 % (ref 0.0–0.2)

## 2021-05-27 LAB — ACETAMINOPHEN LEVEL: Acetaminophen (Tylenol), Serum: <10 ug/mL — ABNORMAL LOW (ref 10–30)

## 2021-05-27 LAB — RESP PANEL BY RT-PCR (FLU A&B, COVID) ARPGX2
Influenza A by PCR: NEGATIVE
Influenza B by PCR: NEGATIVE
SARS Coronavirus 2 by RT PCR: NEGATIVE

## 2021-05-27 LAB — ETHANOL: Alcohol, Ethyl (B): <10 mg/dL (ref <10)

## 2021-05-27 LAB — URINE DRUG SCREEN, QUALITATIVE (ARMC ONLY)
Amphetamines, Ur Screen: NOT DETECTED
Barbiturates, Ur Screen: NOT DETECTED
Benzodiazepine, Ur Scrn: POSITIVE — AB
Cannabinoid 50 Ng, Ur ~~LOC~~: NOT DETECTED
Cocaine Metabolite,Ur ~~LOC~~: NOT DETECTED
MDMA (Ecstasy)Ur Screen: NOT DETECTED
Methadone Scn, Ur: NOT DETECTED
Opiate, Ur Screen: NOT DETECTED
Phencyclidine (PCP) Ur S: NOT DETECTED
Tricyclic, Ur Screen: POSITIVE — AB

## 2021-05-27 LAB — PREGNANCY, URINE: Preg Test, Ur: NEGATIVE

## 2021-05-27 LAB — SALICYLATE LEVEL: Salicylate Lvl: <7 mg/dL — ABNORMAL LOW (ref 7.0–30.0)

## 2021-05-27 NOTE — ED Notes (Signed)
VOl pending consult  

## 2021-05-27 NOTE — ED Provider Notes (Signed)
[facility] Provider Note    Event Date/Time   First MD Initiated Contact with Patient 05/27/21 1717     (approximate)   History   Suicidal   HPI Dawn Lambert is a 31 y.o. female with a history of an electrical disability, umbilical hernia, and a group home resident who presents expressing suicidal ideation.  Patient states that she has been feeling this way as she cannot get her hernia operated on soon enough.  Patient currently denies any homicidal ideation, auditory/visual hallucinations.  Patient states that she has a plan to lay in the street until a car runs over her and caregiver states that she has been trying to do this.     Physical Exam   Triage Vital Signs: ED Triage Vitals  Enc Vitals Group     BP 05/27/21 1654 (!) 152/89     Pulse Rate 05/27/21 1654 78     Resp 05/27/21 1654 18     Temp 05/27/21 1654 98.7 F (37.1 C)     Temp Source 05/27/21 1654 Oral     SpO2 05/27/21 1654 98 %     Weight 05/27/21 1703 283 lb (128.4 kg)     Height 05/27/21 1703 5\' 3"  (1.6 m)     Head Circumference --      Peak Flow --      Pain Score 05/27/21 1703 5     Pain Loc --      Pain Edu? --      Excl. in [place]? --     Most recent vital signs: Vitals:   05/27/21 1703 05/27/21 1918  BP: (!) 152/89 130/67  Pulse: 78 77  Resp: 18 17  Temp: 98.1 F (36.7 C) 98.2 F (36.8 C)  SpO2: 98% 100%    General: Awake, at mental baseline per caregiver CV:  Good peripheral perfusion.  Resp:  Normal effort.  Abd:  No distention.  Other:  Obese middle-aged Caucasian female laying in stretcher in no distress.  Poor insight   ED Results / Procedures / Treatments   Labs (all labs ordered are listed, but only abnormal results are displayed) Labs Reviewed  COMPREHENSIVE METABOLIC PANEL - Abnormal; Notable for the following components:      Result Value   Sodium 128 (*)    Chloride 95 (*)    Glucose, Bld 101 (*)    Calcium 8.7 (*)    All other components  within normal limits  SALICYLATE LEVEL - Abnormal; Notable for the following components:   Salicylate Lvl [id number] (*)    All other components within normal limits  ACETAMINOPHEN LEVEL - Abnormal; Notable for the following components:   Acetaminophen (Tylenol), Serum <10 (*)    All other components within normal limits  CBC - Abnormal; Notable for the following components:   WBC 11.3 (*)    RBC 3.51 (*)    Hemoglobin 10.2 (*)    HCT 32.3 (*)    All other components within normal limits  URINE DRUG SCREEN, QUALITATIVE ([facility] ONLY) - Abnormal; Notable for the following components:   Tricyclic, Ur Screen POSITIVE (*)    Benzodiazepine, Ur Scrn POSITIVE (*)    All other components within normal limits  RESP PANEL BY RT-PCR (FLU A&B, COVID) ARPGX2  ETHANOL  PREGNANCY, URINE   PROCEDURES:  Critical Care performed: No  Procedures   MEDICATIONS ORDERED IN ED: Medications - No data to display   IMPRESSION / MDM / ASSESSMENT AND PLAN /  ED COURSE  I reviewed the triage vital signs and the nursing notes.                              Thoughts are linear and organized, and patient has no AH, VH, or HI. Prior suicide attempt: Caregiver denies Prior Psychiatric Hospitalizations: Multiple  Clinically patient displays no overt toxidrome; they are well appearing, with low suspicion for toxic ingestion given history and exam. Thoughts unlikely 2/2 anemia, hypothyroidism, infection, or ICH.  Consult: Psychiatry to evaluate patient for potential hold for danger to self. Disposition: Plan for reassessment in the morning         FINAL CLINICAL IMPRESSION(S) / ED DIAGNOSES   Final diagnoses:  Suicidal ideation     Rx / DC Orders   ED Discharge Orders     None        Note:  This document was prepared using Dragon voice recognition software and may include unintentional dictation errors.   Naaman Plummer, MD 05/27/21 [phone]

## 2021-05-27 NOTE — ED Notes (Signed)
Report received from [place], English as a second language teacher. Patient alert and oriented, warm and dry, and in no acute distress. Patient denies HI, AVH and pain. Patient made aware of Q15 minute rounds and Psychologist, counselling presence for their safety. Patient instructed to come to this nurse with needs or concerns.

## 2021-05-27 NOTE — ED Notes (Signed)
Pt given nighttime snack. 

## 2021-05-27 NOTE — ED Notes (Signed)
Pt belongings include (bag 1/1):  1 multi-colored bracelet  1 red bracelet  1 pink watch  1 pair of rainbow earrings  2 rings  1 gold colored necklace  1 purple jacket  1 pair of dark purple shoes  1 pair of red pants  1 pink shirt  1 pair of multi-colored socks  1 tan bra  1 pair of tan underwear

## 2021-05-27 NOTE — ED Triage Notes (Signed)
Pt via caregiver from [facility]. Pt states she has been SI because she can't get her hernia operated on. Denies HI. Denies AVH. Pt's plan is to lay in the street till a car runs over here and caregiver states that she has been doing this. Pt is calm and cooperative during triage.

## 2021-05-27 NOTE — ED Notes (Signed)
Pt is tired and wakes up off and on when talking and collecting vitals with Tech. Pt is accompanied by member of [facility]

## 2021-05-28 DIAGNOSIS — F430 Acute stress reaction: Secondary | ICD-10-CM

## 2021-05-28 DIAGNOSIS — R45851 Suicidal ideations: Secondary | ICD-10-CM

## 2021-05-28 NOTE — BH Assessment (Signed)
Comprehensive Clinical Assessment (CCA) Note  05/28/2021 Lambert Lambert [id number]  Chief Complaint: Patient is a 31 year old female presenting to [facility] ED voluntarily. Per triage note Pt via caregiver from [facility]. Pt states she has been SI because she can't get her hernia operated on. Denies HI. Denies AVH. Pt's plan is to lay in the street till a car runs over here and caregiver states that she has been doing this. Pt is calm and cooperative during triage. During assessment patient appears alert and oriented x4, calm and cooperative. Patient is presenting with one of the caretakers from her group home. Patient reports "I feel suicidal and I feel like laying in the road." Per patient's caretaker, caretaker reports "when she doesn't get her way she does this, she will get mad and lay in the road, we have had staff that have had to block traffic from hitting her." When asked if patient will continue to engage in this type of behavior patient reports "yes" and that she has been doing it for years. Patient continues to report SI, denies HI/AH/VH.  Per Psyc NP Anette Riedel patient to be reassessed  Chief Complaint  Patient presents with   Suicidal   Visit Diagnosis: Stress reaction causing mixed disturbance    CCA Screening, Triage and Referral (STR)  Patient Reported Information How did you hear about [name]? Other (Comment)  Referral name: self  Referral phone number: No data recorded  Whom do you see for routine medical problems? No data recorded Practice/Facility Name: No data recorded Practice/Facility Phone Number: No data recorded Name of Contact: No data recorded Contact Number: No data recorded Contact Fax Number: No data recorded Prescriber Name: No data recorded Prescriber Address (if known): No data recorded  What Is the Reason for Your Visit/Call Today? Patient presents with group home staff due to SI  How Long Has This Been Causing You Problems? > than 6  months  What Do You Feel Would Help You the Most Today? Treatment for Depression or other mood problem   Have You Recently Been in Any Inpatient Treatment (Hospital/Detox/Crisis Center/28-Day Program)? No  Name/Location of Program/Hospital:No data recorded How Long Were You There? No data recorded When Were You Discharged? No data recorded  Have You Ever Received Services From [facility] Before? Yes  Who Do You [facility] at [facility]? Medical & Mental Health Treatment   Have You Recently Had Any Thoughts About Hurting Yourself? Yes  Are You Planning to Commit Suicide/Harm Yourself At This time? Yes   Have you Recently Had Thoughts About Hurting Someone Lambert Lambert? No  Explanation: No data recorded  Have You Used Any Alcohol or Drugs in the Past 24 Hours? No  How Long Ago Did You Use Drugs or Alcohol? No data recorded What Did You Use and How Much? No data recorded  Do You Currently Have a Therapist/Psychiatrist? Yes  Name of Therapist/Psychiatrist: Unknown   Have You Been Recently Discharged From Any Office Practice or Programs? No  Explanation of Discharge From Practice/Program: No data recorded    CCA Screening Triage Referral Assessment Type of Contact: Face-to-Face  Is this Initial or Reassessment? No data recorded Date Telepsych consult ordered in [facility]:  No data recorded Time Telepsych consult ordered in [facility]:  No data recorded  Patient Reported Information Reviewed? Yes  Patient Left Without Being Seen? No data recorded Reason for Not Completing Assessment: No data recorded  Collateral Involvement: No data recorded  Does Patient Have a Court  Appointed Legal Guardian? No data recorded Name and Contact of Legal Guardian: No data recorded If Minor and Not Living with Parent(s), Who has Custody? n/a  Is CPS involved or ever been involved? Never  Is APS involved or ever been involved? Never   Patient Determined To Be At Risk for Harm To Self or Others Based on  Review of Patient Reported Information or Presenting Complaint? Yes, for Self-Harm  Method: No data recorded Availability of Means: No data recorded Intent: No data recorded Notification Required: No data recorded Additional Information for Danger to Others Potential: No data recorded Additional Comments for Danger to Others Potential: No data recorded Are There Guns or Other Weapons in Your Home? No data recorded Types of Guns/Weapons: No data recorded Are These Weapons Safely Secured?                            No data recorded Who Could Verify You Are Able To Have These Secured: No data recorded Do You Have any Outstanding Charges, Pending Court Dates, Parole/Probation? No data recorded Contacted To Inform of Risk of Harm To Self or Others: No data recorded  Location of Assessment: [facility] ED   Does Patient Present under Involuntary Commitment? No  IVC Papers Initial File Date: 09/19/20   [place] of Residence: [place]   Patient Currently Receiving the Following Services: Group Home; Medication Management   Determination of Need: Emergent (2 hours)   Options For Referral: Group Home     CCA Biopsychosocial Intake/Chief Complaint:  No data recorded Current Symptoms/Problems: No data recorded  Patient Reported Schizophrenia/Schizoaffective Diagnosis in Past: No   Strengths: Patient is able to communicate her needs  Preferences: No data recorded Abilities: No data recorded  Type of Services Patient Feels are Needed: No data recorded  Initial Clinical Notes/Concerns: No data recorded  Mental Health Symptoms Depression:   Fatigue; Hopelessness; Irritability   Duration of Depressive symptoms:  Greater than two weeks   Mania:   None   Anxiety:    Difficulty concentrating; Fatigue; Irritability; Worrying   Psychosis:   None   Duration of Psychotic symptoms: No data recorded  Trauma:   None   Obsessions:   Recurrent & persistent  thoughts/impulses/images   Compulsions:   "Driven" to perform behaviors/acts; Poor Insight   Inattention:   None   Hyperactivity/Impulsivity:   None   Oppositional/Defiant Behaviors:   None   Emotional Irregularity:   Recurrent suicidal behaviors/gestures/threats; Potentially harmful impulsivity   Other Mood/Personality Symptoms:  No data recorded   Mental Status Exam Appearance and self-care  Stature:   Average   Weight:   Overweight   Clothing:   Casual   Grooming:   Normal   Cosmetic use:   None   Posture/gait:   Normal   Motor activity:   Not Remarkable   Sensorium  Attention:   Normal   Concentration:   Normal   Orientation:   X5   Recall/memory:   Normal   Affect and Mood  Affect:   Appropriate   Mood:   Depressed   Relating  Eye contact:   Normal   Facial expression:   Responsive   Attitude toward examiner:   Cooperative   Thought and Language  Speech flow:  Clear and Coherent   Thought content:   Appropriate to Mood and Circumstances   Preoccupation:   None   Hallucinations:   None   Organization:  No data  recorded  [employer] of Knowledge:   Poor   Intelligence:   Needs investigation   Abstraction:   Functional   Judgement:   Poor   Reality Testing:   Unaware   Insight:   Lacking   Decision Making:   Impulsive   Social Functioning  Social Maturity:   Irresponsible   Social Judgement:   Heedless   Stress  Stressors:   Housing   Coping Ability:   Exhausted   Skill Deficits:   Intellect/education; Decision making   Supports:   Friends/Service system; Family     Religion: Religion/Spirituality Are You A Religious Person?: No  Leisure/Recreation: Leisure / Recreation Do You Have Hobbies?: No  Exercise/Diet: Exercise/Diet Do You Exercise?: No Have You Gained or Lost A Significant Amount of Weight in the Past Six Months?: No Do You Follow a Special Diet?:  No Do You Have Any Trouble Sleeping?: No   CCA Employment/Education Employment/Work Situation: Employment / Work Situation Employment Situation: On disability Why is Patient on Disability: Mental Health/Intellectual disability How Long has Patient Been on Disability: Unknown Has Patient ever Been in the [employer]?: No  Education: Education Is Patient Currently Attending School?: No Did You Have An Individualized Education Program (IIEP): No   CCA Family/Childhood History Family and Relationship History: Family history Marital status: Single Does patient have children?: No  Childhood History:  Childhood History By whom was/is the patient raised?: Mother Did patient suffer any verbal/emotional/physical/sexual abuse as a child?: No Did patient suffer from severe childhood neglect?: No Has patient ever been sexually abused/assaulted/raped as an adolescent or adult?: No Was the patient ever a victim of a crime or a disaster?: No Witnessed domestic violence?: No Has patient been affected by domestic violence as an adult?: No  Child/Adolescent Assessment:     CCA Substance Use Alcohol/Drug Use: Alcohol / Drug Use Pain Medications: see mar Prescriptions: see mar Over the Counter: see mar History of alcohol / drug use?: No history of alcohol / drug abuse Longest period of sobriety (when/how long): None reported  Negative Consequences of Use:  (n/a) Withdrawal Symptoms:  (n/a)                         ASAM's:  Six Dimensions of Multidimensional Assessment  Dimension 1:  Acute Intoxication and/or Withdrawal Potential:      Dimension 2:  Biomedical Conditions and Complications:      Dimension 3:  Emotional, Behavioral, or Cognitive Conditions and Complications:     Dimension 4:  Readiness to Change:     Dimension 5:  Relapse, Continued use, or Continued Problem Potential:     Dimension 6:  Recovery/Living Environment:     ASAM Severity Score:    ASAM  Recommended Level of Treatment:     Substance use Disorder (SUD)    Recommendations for Services/Supports/Treatments:    DSM5 Diagnoses: Patient Active Problem List   Diagnosis Date Noted   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   Suicidal ideation 09/19/2020   Intellectual disability [id number]   Umbilical hernia without obstruction and without gangrene 08/15/2017   Chest pain 11/05/2012   Atrial septal defect 09/19/2011    Patient Centered Plan: Patient is on the following Treatment Plan(s):  Impulse Control   Referrals to Alternative Service(s): Referred to Alternative Service(s):   Place:   Date:   Time:    Referred to Alternative Service(s):   Place:   Date:  Time:    Referred to Alternative Service(s):   Place:   Date:   Time:    Referred to Alternative Service(s):   Place:   Date:   Time:     Oyuki Hogan A Rayanna Matusik, LCAS-A

## 2021-05-28 NOTE — ED Notes (Signed)
Psych NP was just at bedside. Pt states she has not had anything to drink this morning. Pt was already provided with water by ED tech.

## 2021-05-28 NOTE — ED Notes (Signed)
VOL/pending reassessment 

## 2021-05-28 NOTE — ED Notes (Signed)
Paper discharge consent will be signed by caregiver.

## 2021-05-28 NOTE — ED Notes (Signed)
Called group home again, [phone]. Spoke with same man. He repeatedly stated "someone will call you back". Again informed him that pt is being discharged and needs to be picked up.

## 2021-05-28 NOTE — ED Notes (Signed)
Group home called back to ascom number. They will come for pt at about 1130 and will notify RN when on way.

## 2021-05-28 NOTE — ED Notes (Signed)
Tried to call group home to come pick up pt. [phone]. No answer. Pt is cleared for discharge and can leave once group home arrives to pick up. Will call again.

## 2021-05-28 NOTE — ED Notes (Signed)
Called group home at [phone]. Spoke with man who said will call back. Informed that pt is ready for discharge, eager to leave, and discharge papers are ready. He again stated that he will call back. Provided direct ascom number to call back.

## 2021-05-28 NOTE — ED Notes (Signed)
Received call from group home. They are on way to come for pt. They will come back and walk out with pt after they arrive.

## 2021-05-28 NOTE — ED Notes (Addendum)
Pt given tray. No other needs at this time.

## 2021-05-28 NOTE — ED Notes (Signed)
TTS gave this nurse number for CSW to attempt to get their help to call group home so they will come get pt.

## 2021-05-28 NOTE — ED Provider Notes (Signed)
Patient was seen and evaluated by psychiatry and was cleared.  She will get a ride back to her group home in the a.m.   Georga Hacking, MD 05/28/21 0530
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
## 2021-05-28 NOTE — ED Notes (Signed)
Pt belonging bag handed back to pt to dress self in bathroom.  Pt belongings include (bag 1/1):  1 multi-colored bracelet  1 red bracelet  1 pink watch  1 pair of rainbow earrings  2 rings  1 gold colored necklace  1 purple jacket  1 pair of dark purple shoes  1 pair of red pants  1 pink shirt  1 pair of multi-colored socks  1 tan bra  1 pair of tan underwear

## 2021-08-18 ENCOUNTER — Ambulatory Visit (INDEPENDENT_AMBULATORY_CARE_PROVIDER_SITE_OTHER): Payer: Medicaid Other | Admitting: Dermatology

## 2021-08-18 DIAGNOSIS — L409 Psoriasis, unspecified: Secondary | ICD-10-CM | POA: Diagnosis not present

## 2021-08-18 MED ORDER — FLUOCINOLONE ACETONIDE SCALP 0.01 % EX OIL: TOPICAL_OIL | CUTANEOUS | 10 refills | Status: DC

## 2021-08-18 NOTE — Patient Instructions (Signed)

## 2021-08-18 NOTE — Progress Notes (Signed)
? ?  Follow-Up Visit ?  ?Subjective  ?Dawn Lambert is a 31 y.o. female who presents for the following: Psoriasis (Patient here today for 1 year psoriasis follow up at the scalp. Patient currently using fluocinolone oil once weekly and feels she is well controlled. ). ? ?Patient accompanied by caregiver.  ? ?The following portions of the chart were reviewed this encounter and updated as appropriate:  ? Tobacco  Allergies  Meds  Problems  Med Hx  Surg Hx  Fam Hx   ?  ?Review of Systems:  No other skin or systemic complaints except as noted in HPI or Assessment and Plan. ? ?Objective  ?Well appearing patient in no apparent distress; mood and affect are within normal limits. ? ?A focused examination was performed including scalp. Relevant physical exam findings are noted in the Assessment and Plan. ? ?Scalp ?Pinkness and scale at postauricular  ? ? ?Assessment & Plan  ?Psoriasis ?Scalp ?Psoriasis is a chronic non-curable, but treatable genetic/hereditary disease that may have other systemic features affecting other organ systems such as joints (Psoriatic Arthritis). It is associated with an increased risk of inflammatory bowel disease, heart disease, non-alcoholic fatty liver disease, and depression.   ? ?Continue fluocinolone oil 1-3 days weekly as needed for scale and itch. ? ?Chronic and persistent condition with duration or expected duration over one year. Condition is symptomatic / bothersome to patient. Not to goal, but pt is happy with current condition and wants to continue current regimen. ? ?Related Medications ?Fluocinolone Acetonide Scalp (DERMA-SMOOTHE/FS SCALP) 0.01 % OIL ?APPLY TOPICALLY TO SCALP ONCE A WEEK OR UP TO 3 DAYS PER WEEK AS NEEDED FOR FLARES. ? ? ?Return in about 1 year (around 08/19/2022) for Psoriasis. ? ?Anise Salvo, RMA, am acting as scribe for Armida Sans, MD . ?Documentation: I have reviewed the above documentation for accuracy and completeness, and I agree with the  above. ? ?Armida Sans, MD ? ?

## 2021-08-27 ENCOUNTER — Encounter: Payer: Self-pay | Admitting: Dermatology

## 2021-09-22 ENCOUNTER — Other Ambulatory Visit: Payer: Self-pay | Admitting: Family Medicine

## 2021-09-22 ENCOUNTER — Ambulatory Visit
Admission: RE | Admit: 2021-09-22 | Discharge: 2021-09-22 | Disposition: A | Discharge status: Home / Self Care | Payer: Medicare Other | Source: Ambulatory Visit | Attending: Family Medicine | Admitting: Family Medicine

## 2021-09-22 ENCOUNTER — Ambulatory Visit
Admission: RE | Admit: 2021-09-22 | Discharge: 2021-09-22 | Disposition: A | Discharge status: Home / Self Care | Payer: Medicare Other | Attending: Family Medicine | Admitting: Family Medicine

## 2021-09-22 DIAGNOSIS — M25572 Pain in left ankle and joints of left foot: Secondary | ICD-10-CM | POA: Insufficient documentation

## 2021-09-30 ENCOUNTER — Emergency Department
Admission: EM | Admit: 2021-09-30 | Discharge: 2021-10-01 | Disposition: A | Discharge status: Home / Self Care | Payer: Medicare Other | Attending: Emergency Medicine | Admitting: Emergency Medicine

## 2021-09-30 ENCOUNTER — Other Ambulatory Visit: Payer: Self-pay

## 2021-09-30 DIAGNOSIS — E119 Type 2 diabetes mellitus without complications: Secondary | ICD-10-CM | POA: Insufficient documentation

## 2021-09-30 DIAGNOSIS — Y900 Blood alcohol level of less than 20 mg/100 ml: Secondary | ICD-10-CM | POA: Diagnosis not present

## 2021-09-30 DIAGNOSIS — F430 Acute stress reaction: Secondary | ICD-10-CM | POA: Diagnosis present

## 2021-09-30 DIAGNOSIS — I10 Essential (primary) hypertension: Secondary | ICD-10-CM | POA: Diagnosis not present

## 2021-09-30 DIAGNOSIS — R45851 Suicidal ideations: Secondary | ICD-10-CM | POA: Insufficient documentation

## 2021-09-30 DIAGNOSIS — M25572 Pain in left ankle and joints of left foot: Secondary | ICD-10-CM | POA: Insufficient documentation

## 2021-09-30 LAB — CBC
HCT: 31.4 % — ABNORMAL LOW (ref 36.0–46.0)
Hemoglobin: 9.9 g/dL — ABNORMAL LOW (ref 12.0–15.0)
MCH: 28.4 pg (ref 26.0–34.0)
MCHC: 31.5 g/dL (ref 30.0–36.0)
MCV: 90.2 fL (ref 80.0–100.0)
Platelets: 290 10*3/uL (ref 150–400)
RBC: 3.48 MIL/uL — ABNORMAL LOW (ref 3.87–5.11)
RDW: 13.4 % (ref 11.5–15.5)
WBC: 11.9 10*3/uL — ABNORMAL HIGH (ref 4.0–10.5)
nRBC: 0 % (ref 0.0–0.2)

## 2021-09-30 LAB — ACETAMINOPHEN LEVEL: Acetaminophen (Tylenol), Serum: <10 ug/mL — ABNORMAL LOW (ref 10–30)

## 2021-09-30 LAB — COMPREHENSIVE METABOLIC PANEL
ALT: 19 U/L (ref 0–44)
AST: 24 U/L (ref 15–41)
Albumin: 3.8 g/dL (ref 3.5–5.0)
Alkaline Phosphatase: 75 U/L (ref 38–126)
Anion gap: 8 (ref 5–15)
BUN: 9 mg/dL (ref 6–20)
CO2: 25 mmol/L (ref 22–32)
Calcium: 9 mg/dL (ref 8.9–10.3)
Chloride: 100 mmol/L (ref 98–111)
Creatinine, Ser: 0.96 mg/dL (ref 0.44–1.00)
GFR, Estimated: >60 mL/min (ref >60)
Glucose, Bld: 117 mg/dL — ABNORMAL HIGH (ref 70–99)
Potassium: 3.4 mmol/L — ABNORMAL LOW (ref 3.5–5.1)
Sodium: 133 mmol/L — ABNORMAL LOW (ref 135–145)
Total Bilirubin: 0.5 mg/dL (ref 0.3–1.2)
Total Protein: 7.9 g/dL (ref 6.5–8.1)

## 2021-09-30 LAB — POC URINE PREG, ED: Preg Test, Ur: NEGATIVE

## 2021-09-30 LAB — URINE DRUG SCREEN, QUALITATIVE (ARMC ONLY)
Amphetamines, Ur Screen: NOT DETECTED
Barbiturates, Ur Screen: NOT DETECTED
Benzodiazepine, Ur Scrn: NOT DETECTED
Cannabinoid 50 Ng, Ur ~~LOC~~: NOT DETECTED
Cocaine Metabolite,Ur ~~LOC~~: NOT DETECTED
MDMA (Ecstasy)Ur Screen: NOT DETECTED
Methadone Scn, Ur: NOT DETECTED
Opiate, Ur Screen: NOT DETECTED
Phencyclidine (PCP) Ur S: NOT DETECTED
Tricyclic, Ur Screen: NOT DETECTED

## 2021-09-30 LAB — SALICYLATE LEVEL: Salicylate Lvl: <7 mg/dL — ABNORMAL LOW (ref 7.0–30.0)

## 2021-09-30 LAB — ETHANOL: Alcohol, Ethyl (B): <10 mg/dL (ref <10)

## 2021-09-30 NOTE — ED Notes (Signed)
Attempted to call pts mom who is her legal guardian without answer. [phone]
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
## 2021-09-30 NOTE — ED Provider Notes (Signed)
[facility] Provider Note    Event Date/Time   First MD Initiated Contact with Patient 09/30/21 2311     (approximate)   History   Chief Complaint Suicidal   HPI  Dawn Lambert is a 31 y.o. female with past medical history of hypertension, diabetes, and intellectual disability who presents to the ED complaining of suicidal ideation.  Patient reports that she has been having thoughts of harming herself for the past 24 hours due to staff at her group home "being mean" to her.  She states that she ran away from her group home earlier this evening and had thoughts of laying down in the road so that she would be hit by a car.  She denies any auditory or visual hallucinations, denies any thoughts of harming others.  She does report being compliant with her usual medications, denies any alcohol or drug use.  She reports pain in her left ankle for about 2 weeks since she had a fall, states she had an x-ray showing a fracture but has not had a splint or boot placed.     Physical Exam   Triage Vital Signs: ED Triage Vitals  Enc Vitals Group     BP 09/30/21 1821 (!) 145/87     Pulse Rate 09/30/21 1821 71     Resp 09/30/21 1821 17     Temp 09/30/21 1821 98.3 F (36.8 C)     Temp Source 09/30/21 1821 Oral     SpO2 09/30/21 1821 98 %     Weight --      Height --      Head Circumference --      Peak Flow --      Pain Score 09/30/21 1817 0     Pain Loc --      Pain Edu? --      Excl. in GC? --     Most recent vital signs: Vitals:   09/30/21 1821  BP: (!) 145/87  Pulse: 71  Resp: 17  Temp: 98.3 F (36.8 C)  SpO2: 98%    Constitutional: Alert and oriented. Eyes: Conjunctivae are normal. Head: Atraumatic. Nose: No congestion/rhinnorhea. Mouth/Throat: Mucous membranes are moist.  Cardiovascular: Normal rate, regular rhythm. Grossly normal heart sounds.  2+ radial and DP pulses bilaterally. Respiratory: Normal respiratory effort.  No retractions.  Lungs CTAB. Gastrointestinal: Soft and nontender. No distention. Musculoskeletal: Tenderness to palpation noted at lateral malleolus of left ankle with no obvious deformity.  No tenderness to palpation over medial malleolus or along metatarsals. Neurologic:  Normal speech and language. No gross focal neurologic deficits are appreciated.    ED Results / Procedures / Treatments   Labs (all labs ordered are listed, but only abnormal results are displayed) Labs Reviewed  COMPREHENSIVE METABOLIC PANEL - Abnormal; Notable for the following components:      Result Value   Sodium 133 (*)    Potassium 3.4 (*)    Glucose, Bld 117 (*)    All other components within normal limits  SALICYLATE LEVEL - Abnormal; Notable for the following components:   Salicylate Lvl <7.0 (*)    All other components within normal limits  ACETAMINOPHEN LEVEL - Abnormal; Notable for the following components:   Acetaminophen (Tylenol), Serum <10 (*)    All other components within normal limits  CBC - Abnormal; Notable for the following components:   WBC 11.9 (*)    RBC 3.48 (*)    Hemoglobin 9.9 (*)  HCT 31.4 (*)    All other components within normal limits  ETHANOL  URINE DRUG SCREEN, QUALITATIVE ([facility] ONLY)  LITHIUM LEVEL  POC URINE PREG, ED   RADIOLOGY Left ankle x-ray reviewed and interpreted by me with healing distal fibular fracture, no acute fracture or dislocation noted.  PROCEDURES:  Critical Care performed: No  Procedures   MEDICATIONS ORDERED IN ED: Medications - No data to display   IMPRESSION / MDM / ASSESSMENT AND PLAN / ED COURSE  I reviewed the triage vital signs and the nursing notes.                              31 y.o. female with past medical history of hypertension, diabetes, and intellectual disability who presents to the ED complaining of suicidal ideation after staff at her group home was mean to her, additionally complains of pain in her left ankle following recent  injury.  Patient's presentation is most consistent with acute presentation with potential threat to life or bodily function.  Differential diagnosis includes, but is not limited to, suicidal ideation, intellectual disability, malingering, substance abuse, ankle fracture, ankle sprain.  Patient nontoxic-appearing and in no acute distress, vital signs are unremarkable and she is neurovascular intact to her left lower extremity.  Outpatient x-rays were reviewed and patient did have healing fracture of distal fibula noted about 1 week prior.  We will check repeat x-ray to ensure no new injury, no signs of infectious process to left leg or ankle.  Screening labs are unremarkable with CBC showing chronic anemia, BMP without electrolyte abnormality or AKI, LFTs within normal limits, Tylenol and salicylate levels undetectable.  Plan for evaluation by psychiatry and TTS given her suicidal ideation, however patient is calm and cooperative here in the ED and we will hold off on IVC.  The patient has been placed in psychiatric observation due to the need to provide a safe environment for the patient while obtaining psychiatric consultation and evaluation, as well as ongoing medical and medication management to treat the patient's condition.  The patient has not been placed under full IVC at this time.       FINAL CLINICAL IMPRESSION(S) / ED DIAGNOSES   Final diagnoses:  Suicidal ideation  Acute left ankle pain     Rx / DC Orders   ED Discharge Orders     None        Note:  This document was prepared using Dragon voice recognition software and may include unintentional dictation errors.   Chesley Noon, MD 10/01/21 [phone]

## 2021-09-30 NOTE — ED Notes (Addendum)
Patient reports she is here for left ankle pain and for feeling suicidal.  Patient reports she has fallen several times over the past month and that it why her ankle hurts, no swelling noted.  States she is suicidal because the people at the group home are not nice to her.

## 2021-09-30 NOTE — ED Notes (Signed)
Pt given sandwich tray, pt in recliner with warm blanket.

## 2021-09-30 NOTE — ED Triage Notes (Signed)
Pt from [facility] via ems- pt was found on sidewalk by road reporting suicidal thoughts bc she doesn't like the staff at her group home. Pt also reports that she would like to hurt the staff at the group home.   VSS [id number]  [facility]- [phone] Pts legal guardian is her mother

## 2021-09-30 NOTE — ED Notes (Signed)
Pt sitting in triage area in green chair, no distress noted, waiting for bed in main ED. Charge RN aware.

## 2021-10-01 ENCOUNTER — Emergency Department: Payer: Medicare Other

## 2021-10-01 DIAGNOSIS — M25572 Pain in left ankle and joints of left foot: Secondary | ICD-10-CM | POA: Diagnosis not present

## 2021-10-01 DIAGNOSIS — F430 Acute stress reaction: Secondary | ICD-10-CM

## 2021-10-01 LAB — LITHIUM LEVEL: Lithium Lvl: 0.63 mmol/L (ref 0.60–1.20)

## 2021-10-01 MED ORDER — LEVOTHYROXINE SODIUM 50 MCG PO TABS
175.0000 ug | ORAL_TABLET | Freq: Every day | ORAL | Status: DC
  Administered 2021-10-01: 175 ug via ORAL
  Filled 2021-10-01: qty 4

## 2021-10-01 MED ORDER — QUETIAPINE FUMARATE 25 MG PO TABS
50.0000 mg | ORAL_TABLET | Freq: Every day | ORAL | Status: DC
  Administered 2021-10-01: 50 mg via ORAL
  Filled 2021-10-01: qty 2

## 2021-10-01 MED ORDER — PROPRANOLOL HCL 20 MG PO TABS: 40.0000 mg | ORAL_TABLET | Freq: Two times a day (BID) | ORAL | Status: DC

## 2021-10-01 MED ORDER — ALBUTEROL SULFATE HFA 108 (90 BASE) MCG/ACT IN AERS: 2.0000 | INHALATION_SPRAY | RESPIRATORY_TRACT | Status: DC | PRN

## 2021-10-01 MED ORDER — ATORVASTATIN CALCIUM 20 MG PO TABS: 10.0000 mg | ORAL_TABLET | Freq: Every day | ORAL | Status: DC

## 2021-10-01 MED ORDER — PROPRANOLOL HCL 20 MG PO TABS
40.0000 mg | ORAL_TABLET | Freq: Two times a day (BID) | ORAL | Status: DC
  Administered 2021-10-01: 40 mg via ORAL
  Filled 2021-10-01: qty 2

## 2021-10-01 MED ORDER — CLONAZEPAM 0.5 MG PO TABS
1.0000 mg | ORAL_TABLET | Freq: Three times a day (TID) | ORAL | Status: DC
  Administered 2021-10-01: 1 mg via ORAL
  Filled 2021-10-01: qty 2

## 2021-10-01 MED ORDER — CARBAMAZEPINE 200 MG PO TABS: 400.0000 mg | ORAL_TABLET | Freq: Two times a day (BID) | ORAL | Status: DC

## 2021-10-01 MED ORDER — CARBAMAZEPINE 200 MG PO TABS
400.0000 mg | ORAL_TABLET | Freq: Two times a day (BID) | ORAL | Status: DC
  Administered 2021-10-01: 400 mg via ORAL
  Filled 2021-10-01: qty 2

## 2021-10-01 MED ORDER — METFORMIN HCL 500 MG PO TABS: 1000.0000 mg | ORAL_TABLET | Freq: Two times a day (BID) | ORAL | Status: DC

## 2021-10-01 MED ORDER — LEVOTHYROXINE SODIUM 50 MCG PO TABS: 175.0000 ug | ORAL_TABLET | Freq: Every day | ORAL | Status: DC

## 2021-10-01 MED ORDER — IBUPROFEN 400 MG PO TABS
400.0000 mg | ORAL_TABLET | Freq: Four times a day (QID) | ORAL | Status: DC | PRN
  Administered 2021-10-01: 400 mg via ORAL
  Filled 2021-10-01: qty 1

## 2021-10-01 MED ORDER — LORAZEPAM ER 2 MG PO CS24: 2.0000 mg | EXTENDED_RELEASE_CAPSULE | Freq: Every morning | ORAL | Status: DC

## 2021-10-01 MED ORDER — GABAPENTIN 600 MG PO TABS
600.0000 mg | ORAL_TABLET | Freq: Three times a day (TID) | ORAL | Status: DC
  Administered 2021-10-01: 600 mg via ORAL
  Filled 2021-10-01: qty 1

## 2021-10-01 MED ORDER — LITHIUM CARBONATE 300 MG PO CAPS: 900.0000 mg | ORAL_CAPSULE | Freq: Every day | ORAL | Status: DC

## 2021-10-01 MED ORDER — METFORMIN HCL 500 MG PO TABS
1000.0000 mg | ORAL_TABLET | Freq: Two times a day (BID) | ORAL | Status: DC
  Administered 2021-10-01: 1000 mg via ORAL
  Filled 2021-10-01: qty 2

## 2021-10-01 MED ORDER — LISINOPRIL 10 MG PO TABS: 20.0000 mg | ORAL_TABLET | Freq: Every day | ORAL | Status: DC

## 2021-10-01 NOTE — ED Notes (Signed)
Attempted to call [facility] for discharge. No answer. Unable to leave VM. Will attempt again.

## 2021-10-01 NOTE — ED Notes (Signed)
Staff member from group home provided with AVS and discharge instructions. Denies questions. Pt ambulated out of department with steady gait.

## 2021-10-01 NOTE — ED Notes (Signed)
Attempted to call [facility] for pending discharge. No answer. Unable to leave VM. Will attempt again later.

## 2021-10-01 NOTE — ED Notes (Signed)
Lunch tray given. 

## 2021-10-01 NOTE — ED Notes (Signed)
To xray via wheelchair.

## 2021-10-01 NOTE — ED Notes (Signed)
VOL/pending psych consult 

## 2021-10-01 NOTE — BH Assessment (Signed)
Comprehensive Clinical Assessment (CCA) Note  10/01/2021 Lucy Antigua [id number] Recommendations for Services/Supports/Treatments: Pt pending psych consult/disposition. Dawn Lambert is a 31 year old, English speaking, white female with no known PMH. Pt has IDD. Per triage note: Pt to ED via [facility] from side of road. Pt resides at [facility]. Pt reports SI due to staff not being friendly. Pt states she plans to lay in the road and let a car run over her. Upon assessment pt. continued to endorse SI with a plan to lay in road and wait to be ran over with a car. Pt denied symptoms of depression, anxiety, or having thoughts of SI prior to her interactions with staff earlier in the day. Pt reported feeling that the staff were being mean and snapping at her. Pt was oriented x4 but had poor reality testing. Pt denied appetite or sleep disturbance. Pt reported that she is medication compliant. Pt was calm and cooperative throughout the assessment. Pt had normal concentration and clear/coherent speech. Pt had poor insight and impaired judgment. Pt had a dysphoric mood and a constricted affect. Pt had repetitive psychomotor activity and a rigid posture. Pt denied HI/AV/H.   Chief Complaint:  Chief Complaint  Patient presents with   Suicidal   Visit Diagnosis: Stress reaction causing mixed disturbance of emotion and conduct    CCA Screening, Triage and Referral (STR)  Patient Reported Information How did you hear about [name]? Other (Comment) (EMS)  Referral name: No data recorded Referral phone number: No data recorded  Whom do you see for routine medical problems? No data recorded Practice/Facility Name: No data recorded Practice/Facility Phone Number: No data recorded Name of Contact: No data recorded Contact Number: No data recorded Contact Fax Number: No data recorded Prescriber Name: No data recorded Prescriber Address (if known): No data recorded  What Is the Reason for Your  Visit/Call Today? Pt from [facility] via ems- pt was found on sidewalk by road reporting suicidal thoughts bc she doesn't like the staff at her group home. Pt also reports that she would like to hurt the staff at the group home.Pt to ED via [facility] from side of road. Pt resides at [facility]. Pt reports SI due to staff not being friendly. Pt states she plans to lay in the road and let a car run over her.  How Long Has This Been Causing You Problems? > than 6 months  What Do You Feel Would Help You the Most Today? Treatment for Depression or other mood problem   Have You Recently Been in Any Inpatient Treatment (Hospital/Detox/Crisis Center/28-Day Program)? No data recorded Name/Location of Program/Hospital:No data recorded How Long Were You There? No data recorded When Were You Discharged? No data recorded  Have You Ever Received Services From [facility] Before? No data recorded Who Do You See at [facility]? No data recorded  Have You Recently Had Any Thoughts About Hurting Yourself? Yes  Are You Planning to Commit Suicide/Harm Yourself At This time? Yes   Have you Recently Had Thoughts About Hurting Someone [name]? No  Explanation: No data recorded  Have You Used Any Alcohol or Drugs in the Past 24 Hours? No  How Long Ago Did You Use Drugs or Alcohol? No data recorded What Did You Use and How Much? No data recorded  Do You Currently Have a Therapist/Psychiatrist? Yes  Name of Therapist/Psychiatrist: Unknown   Have You Been Recently Discharged From Any Office Practice or Programs? No  Explanation of  Discharge From Practice/Program: No data recorded    CCA Screening Triage Referral Assessment Type of Contact: Face-to-Face  Is this Initial or Reassessment? No data recorded Date Telepsych consult ordered in [facility]:  No data recorded Time Telepsych consult ordered in [facility]:  No data recorded  Patient Reported Information Reviewed? No data recorded Patient Left Without Being  Seen? No data recorded Reason for Not Completing Assessment: No data recorded  Collateral Involvement: None provided   Does Patient Have a Burr? No data recorded Name and Contact of Legal Guardian: No data recorded If Minor and Not Living with Parent(s), Who has Custody? n/a  Is CPS involved or ever been involved? Never  Is APS involved or ever been involved? Never   Patient Determined To Be At Risk for Harm To Self or Others Based on Review of Patient Reported Information or Presenting Complaint? Yes, for Self-Harm  Method: No data recorded Availability of Means: No data recorded Intent: No data recorded Notification Required: No data recorded Additional Information for Danger to Others Potential: No data recorded Additional Comments for Danger to Others Potential: No data recorded Are There Guns or Other Weapons in Your Home? No data recorded Types of Guns/Weapons: No data recorded Are These Weapons Safely Secured?                            No data recorded Who Could Verify You Are Able To Have These Secured: No data recorded Do You Have any Outstanding Charges, Pending Court Dates, Parole/Probation? No data recorded Contacted To Inform of Risk of Harm To Self or Others: No data recorded  Location of Assessment: [facility] ED   Does Patient Present under Involuntary Commitment? No  IVC Papers Initial File Date: No data recorded  [place] of Residence:    Patient Currently Receiving the Following Services: Group Home; Medication Management   Determination of Need: Emergent (2 hours)   Options For Referral: ED Visit; Therapeutic Triage Services     CCA Biopsychosocial Intake/Chief Complaint:  No data recorded Current Symptoms/Problems: No data recorded  Patient Reported Schizophrenia/Schizoaffective Diagnosis in Past: No   Strengths: Patient is able to communicate her needs  Preferences: No data recorded Abilities: No data  recorded  Type of Services Patient Feels are Needed: No data recorded  Initial Clinical Notes/Concerns: No data recorded  Mental Health Symptoms Depression:   None   Duration of Depressive symptoms:  N/A   Mania:   None   Anxiety:    Difficulty concentrating; Irritability; Worrying   Psychosis:   None   Duration of Psychotic symptoms: No data recorded  Trauma:   None   Obsessions:   Recurrent & persistent thoughts/impulses/images; Cause anxiety; Disrupts routine/functioning; Poor insight   Compulsions:   "Driven" to perform behaviors/acts; Poor Insight; Disrupts with routine/functioning; Repeated behaviors/mental acts   Inattention:   None   Hyperactivity/Impulsivity:   None   Oppositional/Defiant Behaviors:   None   Emotional Irregularity:   Recurrent suicidal behaviors/gestures/threats; Potentially harmful impulsivity   Other Mood/Personality Symptoms:  No data recorded   Mental Status Exam Appearance and self-care  Stature:   Average   Weight:   Overweight   Clothing:   -- (In scrubs)   Grooming:   Normal   Cosmetic use:   None   Posture/gait:   Rigid   Motor activity:   Repetitive   Sensorium  Attention:   Normal   Concentration:  Normal   Orientation:   Situation; Person; Object; Place   Recall/memory:   Normal   Affect and Mood  Affect:   Constricted   Mood:   Dysphoric   Relating  Eye contact:   None   Facial expression:   Constricted   Attitude toward examiner:   Cooperative   Thought and Language  Speech flow:  Clear and Coherent   Thought content:   Appropriate to Mood and Circumstances   Preoccupation:   None   Hallucinations:   None   Organization:  No data recorded  Affiliated Computer ServicesExecutive Functions  Fund of Knowledge:   Poor   Intelligence:   Needs investigation   Abstraction:   Functional   Judgement:   Poor   Reality Testing:   Unaware   Insight:   Lacking   Decision Making:    Impulsive   Social Functioning  Social Maturity:   Irresponsible   Social Judgement:   Heedless   Stress  Stressors:   Other (Comment) (Interactions with group home staff)   Coping Ability:   Exhausted   Skill Deficits:   Intellect/education; Decision making   Supports:   Friends/Service system; Family     Religion: Religion/Spirituality Are You A Religious Person?: No  Leisure/Recreation: Leisure / Recreation Do You Have Hobbies?: No  Exercise/Diet: Exercise/Diet Do You Exercise?: No Have You Gained or Lost A Significant Amount of Weight in the Past Six Months?: No Do You Follow a Special Diet?: No Do You Have Any Trouble Sleeping?: No   CCA Employment/Education Employment/Work Situation: Employment / Work Systems developerituation Employment Situation: On disability Why is Patient on Disability: Mental Health/Intellectual disability How Long has Patient Been on Disability: Unknown Patient's Job has Been Impacted by Current Illness: No Has Patient ever Been in the [employer] BancorpMilitary?: No  Education: Education Is Patient Currently Attending School?: No Did Theme park managerYou Attend College?: No Did You Have An Individualized Education Program (IIEP):  (UTA) Did You Have Any Difficulty At School?:  (UTA) Patient's Education Has Been Impacted by Current Illness:  (UTA)   CCA Family/Childhood History Family and Relationship History: Family history Marital status: Single Does patient have children?: No  Childhood History:  Childhood History By whom was/is the patient raised?: Mother Did patient suffer any verbal/emotional/physical/sexual abuse as a child?: No Did patient suffer from severe childhood neglect?: No Has patient ever been sexually abused/assaulted/raped as an adolescent or adult?: No Was the patient ever a victim of a crime or a disaster?: No Witnessed domestic violence?: No Has patient been affected by domestic violence as an adult?: No  Child/Adolescent Assessment:      CCA Substance Use Alcohol/Drug Use: Alcohol / Drug Use Pain Medications: see mar Prescriptions: see mar Over the Counter: see mar History of alcohol / drug use?: No history of alcohol / drug abuse Longest period of sobriety (when/how long): None reported                          ASAM's:  Six Dimensions of Multidimensional Assessment  Dimension 1:  Acute Intoxication and/or Withdrawal Potential:      Dimension 2:  Biomedical Conditions and Complications:      Dimension 3:  Emotional, Behavioral, or Cognitive Conditions and Complications:     Dimension 4:  Readiness to Change:     Dimension 5:  Relapse, Continued use, or Continued Problem Potential:     Dimension 6:  Recovery/Living Environment:     ASAM Severity Score:  ASAM Recommended Level of Treatment:     Substance use Disorder (SUD)    Recommendations for Services/Supports/Treatments:    DSM5 Diagnoses: Patient Active Problem List   Diagnosis Date Noted   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   Suicidal ideation 09/19/2020   Intellectual disability [id number]   Umbilical hernia without obstruction and without gangrene 08/15/2017   Chest pain 11/05/2012   Atrial septal defect 09/19/2011    Jahnaya Branscome R Coen Miyasato, LCAS

## 2021-10-01 NOTE — ED Notes (Signed)
Called Drue Stager, pt's mother and legal guardian for assistance getting in touch with group home. Pt's mother gave number for "[facility]" with group home [place] to attempt. Attempted to call that number with no answer. VM full. Will continue to attempt.

## 2021-10-01 NOTE — Consult Note (Signed)
[facility] Face-to-Face Psychiatry Consult   Reason for Consult:  upset with her group home and threatened to run in the street Referring Physician:  Dr Toni Amend Patient Identification: Dawn Lambert MRN:  [id number] Principal Diagnosis: Stress reaction causing mixed disturbance of emotion and conduct Diagnosis:  Principal Problem:   Stress reaction causing mixed disturbance of emotion and conduct   Total Time spent with patient: 45 minutes  Subjective:   Dawn Lambert is a 31 y.o. female patient admitted with threats to run in the road.  HPI:  31 yo female with a history of IDD and a low threshold for frustration.  She got upset at her group home and threatened to run and lay in the  road.  Today, she is calm and cooperative with no suicidal/homicidal ideations, hallucinations, or substance abuse.  She reports, "I made a fool out of myself yesterday."  She is agreeable to return to the group home, psychiatrically cleared.  Past Psychiatric History: IDD, anxiety  Risk to Self:  none Risk to Others:  none Prior Inpatient Therapy:  none Prior Outpatient Therapy:  yes  Past Medical History:  Past Medical History:  Diagnosis Date   Anxiety    Arnold-Chiari malformation (HCC)    Atrial septal defect    Complication of anesthesia    when pt was 11, woke up during surgery (muscle biopsy)   Constipation    Contraception management    Dandruff    Dermatomyositis (HCC)    age 85   Diabetes mellitus without complication (HCC)    Elevated blood pressure    Hypertension    Hypothyroidism    Mental retardation    Mood disorder (HCC)    Overweight(278.02)    Pelvic pain    Prediabetes     Past Surgical History:  Procedure Laterality Date   ASD REPAIR     at age 62   UMBILICAL HERNIA REPAIR N/A 09/17/2017   Procedure: HERNIA REPAIR UMBILICAL ADULT;  Surgeon: Earline Mayotte, MD;  Location: [facility] ORS;  Service: General;  Laterality: N/A;   Family History: History reviewed. No  pertinent family history. Family Psychiatric  History: none Social History:  Social History   Substance and Sexual Activity  Alcohol Use No     Social History   Substance and Sexual Activity  Drug Use No    Social History   Socioeconomic History   Marital status: Single    Spouse name: Not on file   Number of children: Not on file   Years of education: Not on file   Highest education level: Not on file  Occupational History   Not on file  Tobacco Use   Smoking status: Never   Smokeless tobacco: Never  Vaping Use   Vaping Use: Never used  Substance and Sexual Activity   Alcohol use: No   Drug use: No   Sexual activity: Not on file  Other Topics Concern   Not on file  Social History Narrative   Not on file   Social Determinants of Health   Financial Resource Strain: Not on file  Food Insecurity: Not on file  Transportation Needs: Not on file  Physical Activity: Not on file  Stress: Not on file  Social Connections: Not on file   Additional Social History:    Allergies:   Allergies  Allergen Reactions   Augmentin [Amoxicillin-Pot Clavulanate] Other (See Comments)    Has patient had a PCN reaction causing immediate rash, facial/tongue/throat swelling, SOB or  lightheadedness with hypotension: ______ Has patient had a PCN reaction causing severe rash involving mucus membranes or skin necrosis: _______ Has patient had a PCN reaction that required hospitalization: _______ Has patient had a PCN reaction occurring within the last 10 years:_______ If all of the above answers are "NO", then may proceed with Cephalosporin use.     Labs:  Results for orders placed or performed during the hospital encounter of 09/30/21 (from the past 48 hour(s))  Comprehensive metabolic panel     Status: Abnormal   Collection Time: 09/30/21  6:20 PM  Result Value Ref Range   Sodium 133 (L) 135 - 145 mmol/L   Potassium 3.4 (L) 3.5 - 5.1 mmol/L   Chloride 100 98 - 111 mmol/L   CO2  25 22 - 32 mmol/L   Glucose, Bld 117 (H) 70 - 99 mg/dL    Comment: Glucose reference range applies only to samples taken after fasting for at least 8 hours.   BUN 9 6 - 20 mg/dL   Creatinine, Ser 1.60 0.44 - 1.00 mg/dL   Calcium 9.0 8.9 - 10.9 mg/dL   Total Protein 7.9 6.5 - 8.1 g/dL   Albumin 3.8 3.5 - 5.0 g/dL   AST 24 15 - 41 U/L   ALT 19 0 - 44 U/L   Alkaline Phosphatase 75 38 - 126 U/L   Total Bilirubin 0.5 0.3 - 1.2 mg/dL   GFR, Estimated >32 >35 mL/min    Comment: (NOTE) Calculated using the CKD-EPI Creatinine Equation (2021)    Anion gap 8 5 - 15    Comment: Performed at [facility], [address] [place], [name] [address]  Ethanol     Status: None   Collection Time: 09/30/21  6:20 PM  Result Value Ref Range   Alcohol, Ethyl (B) <10 <10 mg/dL    Comment: (NOTE) Lowest detectable limit for serum alcohol is 10 mg/dL.  For medical purposes only. Performed at [facility], [address] [place], [name] [address]   Salicylate level     Status: Abnormal   Collection Time: 09/30/21  6:20 PM  Result Value Ref Range   Salicylate Lvl <7.0 (L) 7.0 - 30.0 mg/dL    Comment: Performed at [facility], [address] [place], [name] [address]  Acetaminophen level     Status: Abnormal   Collection Time: 09/30/21  6:20 PM  Result Value Ref Range   Acetaminophen (Tylenol), Serum <10 (L) 10 - 30 ug/mL    Comment: (NOTE) Therapeutic concentrations vary significantly. A range of 10-30 ug/mL  may be an effective concentration for many patients. However, some  are best treated at concentrations outside of this range. Acetaminophen concentrations >150 ug/mL at 4 hours after ingestion  and >50 ug/mL at 12 hours after ingestion are often associated with  toxic reactions.  Performed at [facility], [address] [place], [name] [address]   cbc     Status: Abnormal   Collection Time: 09/30/21  6:20 PM  Result Value Ref Range   WBC  11.9 (H) 4.0 - 10.5 K/uL   RBC 3.48 (L) 3.87 - 5.11 MIL/uL   Hemoglobin 9.9 (L) 12.0 - 15.0 g/dL   HCT 15.1 (L) 76.1 - 60.7 %   MCV 90.2 80.0 - 100.0 fL   MCH 28.4 26.0 - 34.0 pg   MCHC 31.5 30.0 - 36.0 g/dL   RDW 37.1 06.2 - 69.4 %   Platelets 290 150 - 400 K/uL  nRBC 0.0 0.0 - 0.2 %    Comment: Performed at [facility], [address] [place], [name] [address]  Lithium level     Status: None   Collection Time: 09/30/21  6:20 PM  Result Value Ref Range   Lithium Lvl 0.63 0.60 - 1.20 mmol/L    Comment: Performed at [facility], [address] [place], [name] [address]  Urine Drug Screen, Qualitative     Status: None   Collection Time: 09/30/21  8:22 PM  Result Value Ref Range   Tricyclic, Ur Screen NONE DETECTED NONE DETECTED   Amphetamines, Ur Screen NONE DETECTED NONE DETECTED   MDMA (Ecstasy)Ur Screen NONE DETECTED NONE DETECTED   Cocaine Metabolite,Ur [place] NONE DETECTED NONE DETECTED   Opiate, Ur Screen NONE DETECTED NONE DETECTED   Phencyclidine (PCP) Ur S NONE DETECTED NONE DETECTED   Cannabinoid 50 Ng, Ur [place] NONE DETECTED NONE DETECTED   Barbiturates, Ur Screen NONE DETECTED NONE DETECTED   Benzodiazepine, Ur Scrn NONE DETECTED NONE DETECTED   Methadone Scn, Ur NONE DETECTED NONE DETECTED    Comment: (NOTE) Tricyclics + metabolites, urine    Cutoff 1000 ng/mL Amphetamines + metabolites, urine  Cutoff 1000 ng/mL MDMA (Ecstasy), urine              Cutoff 500 ng/mL Cocaine Metabolite, urine          Cutoff 300 ng/mL Opiate + metabolites, urine        Cutoff 300 ng/mL Phencyclidine (PCP), urine         Cutoff 25 ng/mL Cannabinoid, urine                 Cutoff 50 ng/mL Barbiturates + metabolites, urine  Cutoff 200 ng/mL Benzodiazepine, urine              Cutoff 200 ng/mL Methadone, urine                   Cutoff 300 ng/mL  The urine drug screen provides only a preliminary, unconfirmed analytical test result and should not be used for  non-medical purposes. Clinical consideration and professional judgment should be applied to any positive drug screen result due to possible interfering substances. A more specific alternate chemical method must be used in order to obtain a confirmed analytical result. Gas chromatography / mass spectrometry (GC/MS) is the preferred confirm atory method. Performed at [facility], [address] [place], [name] [address]   POC urine preg, ED     Status: None   Collection Time: 09/30/21  8:25 PM  Result Value Ref Range   Preg Test, Ur NEGATIVE NEGATIVE    Comment:        THE SENSITIVITY OF THIS METHODOLOGY IS >24 mIU/mL     Current Facility-Administered Medications  Medication Dose Route Frequency Provider Last Rate Last Admin   albuterol (VENTOLIN HFA) 108 (90 Base) MCG/ACT inhaler 2 puff  2 puff Inhalation Q4H PRN Chesley Noon, MD       atorvastatin (LIPITOR) tablet 10 mg  10 mg Oral QHS Chesley Noon, MD       carbamazepine (TEGRETOL) tablet 400 mg  400 mg Oral BID Otelia Sergeant, RPH   400 mg at 10/01/21 0329   clonazePAM (KLONOPIN) tablet 1 mg  1 mg Oral TID Chesley Noon, MD   1 mg at 10/01/21 0753   gabapentin (NEURONTIN) tablet 600 mg  600 mg Oral TID Chesley Noon, MD   600 mg at  10/01/21 0754   ibuprofen (ADVIL) tablet 400 mg  400 mg Oral Q6H PRN Chesley NoonJessup, Charles, MD   400 mg at 10/01/21 0501   levothyroxine (SYNTHROID) tablet 175 mcg  175 mcg Oral Q0600 Otelia SergeantBelue, Nathan S, RPH   175 mcg at 10/01/21 0750   lisinopril (ZESTRIL) tablet 20 mg  20 mg Oral QHS Chesley NoonJessup, Charles, MD       lithium carbonate capsule 900 mg  900 mg Oral QHS Chesley NoonJessup, Charles, MD       metFORMIN (GLUCOPHAGE) tablet 1,000 mg  1,000 mg Oral BID WC Otelia SergeantBelue, Nathan S, RPH   1,000 mg at 10/01/21 0750   propranolol (INDERAL) tablet 40 mg  40 mg Oral BID Otelia SergeantBelue, Nathan S, RPH   40 mg at 10/01/21 0329   QUEtiapine (SEROQUEL) tablet 50 mg  50 mg Oral Daily Chesley NoonJessup, Charles, MD   50 mg at 10/01/21 [id number]    Current Outpatient Medications  Medication Sig Dispense Refill   atorvastatin (LIPITOR) 10 MG tablet Take 10 mg by mouth at bedtime.     carbamazepine (TEGRETOL) 200 MG tablet Take 400 mg by mouth 2 (two) times daily.     cholecalciferol (VITAMIN D) 1000 units tablet Take 1,000 Units by mouth daily.      clonazePAM (KLONOPIN) 1 MG tablet Take 1 mg by mouth 3 (three) times daily.      gabapentin (NEURONTIN) 600 MG tablet Take 600 mg by mouth 3 (three) times daily.     levothyroxine (SYNTHROID, LEVOTHROID) 175 MCG tablet Take 175 mcg by mouth daily before breakfast.     lisinopril (PRINIVIL,ZESTRIL) 20 MG tablet Take 20 mg by mouth at bedtime.     lithium carbonate 300 MG capsule Take 900 mg by mouth at bedtime.     LORazepam ER (LOREEV XR) 2 MG CS24 Take 2 mg by mouth in the morning.     metFORMIN (GLUCOPHAGE) 500 MG tablet Take 1,000 mg by mouth 2 (two) times daily.      Multiple Vitamin (THEREMS PO) Take 1 tablet by mouth daily.     propranolol (INDERAL) 40 MG tablet Take 40 mg by mouth 2 (two) times daily.     QUEtiapine (SEROQUEL) 25 MG tablet Take 1 tablet (25 mg total) by mouth 3 (three) times daily with meals. (Patient taking differently: Take 50 mg by mouth daily.) 90 tablet 2   acetaminophen (TYLENOL) 325 MG tablet Take 650 mg by mouth every 6 (six) hours as needed for mild pain or moderate pain.     albuterol (VENTOLIN HFA) 108 (90 Base) MCG/ACT inhaler Inhale 2 puffs into the lungs every 4 (four) hours as needed.      Musculoskeletal: Strength & Muscle Tone: within normal limits Gait & Station: normal Patient leans: N/A  Psychiatric Specialty Exam: Physical Exam Vitals and nursing note reviewed.  Constitutional:      Appearance: Normal appearance.  HENT:     Head: Normocephalic.     Nose: Nose normal.  Pulmonary:     Effort: Pulmonary effort is normal.  Musculoskeletal:        General: Normal range of motion.     Cervical back: Normal range of motion.  Neurological:      General: No focal deficit present.     Mental Status: She is alert and oriented to person, place, and time.  Psychiatric:        Attention and Perception: Attention and perception normal.        Mood and Affect:  Mood and affect normal.        Speech: Speech normal.        Behavior: Behavior normal. Behavior is cooperative.        Thought Content: Thought content normal.        Cognition and Memory: Cognition is impaired.        Judgment: Judgment is impulsive.    Review of Systems  All other systems reviewed and are negative.  Blood pressure 132/82, pulse 64, temperature 98.3 F (36.8 C), temperature source Oral, resp. rate 16, SpO2 100 %.There is no height or weight on file to calculate BMI.  General Appearance: Casual  Eye Contact:  Good  Speech:  Normal Rate  Volume:  Normal  Mood:  Euthymic  Affect:  Appropriate  Thought Process:  Coherent and Descriptions of Associations: Intact  Orientation:  Full (Time, Place, and Person)  Thought Content:  WDL and Logical  Suicidal Thoughts:  No  Homicidal Thoughts:  No  Memory:  Immediate;   Good Recent;   Good Remote;   Good  Judgement:  Fair  Insight:  Fair  Psychomotor Activity:  Normal  Concentration:  Concentration: Good and Attention Span: Good  Recall:  Good  Fund of Knowledge:  Fair  Language:  Good  Akathisia:  No  Handed:  Right  AIMS (if indicated):     Assets:  Housing Leisure Time Physical Health Resilience Social Support  ADL's:  Intact  Cognition:  Impaired,  Mild  Sleep:        Physical Exam: Physical Exam Vitals and nursing note reviewed.  Constitutional:      Appearance: Normal appearance.  HENT:     Head: Normocephalic.     Nose: Nose normal.  Pulmonary:     Effort: Pulmonary effort is normal.  Musculoskeletal:        General: Normal range of motion.     Cervical back: Normal range of motion.  Neurological:     General: No focal deficit present.     Mental Status: She is alert and  oriented to person, place, and time.  Psychiatric:        Attention and Perception: Attention and perception normal.        Mood and Affect: Mood and affect normal.        Speech: Speech normal.        Behavior: Behavior normal. Behavior is cooperative.        Thought Content: Thought content normal.        Cognition and Memory: Cognition is impaired.        Judgment: Judgment is impulsive.   Review of Systems  All other systems reviewed and are negative. Blood pressure 132/82, pulse 64, temperature 98.3 F (36.8 C), temperature source Oral, resp. rate 16, SpO2 100 %. There is no height or weight on file to calculate BMI.  Treatment Plan Summary: Stress reaction with mixed disturbance of emotions and conduct: Return to group home and continue medication regiment along with therapy  Disposition: No evidence of imminent risk to self or others at present.    Nanine Means, NP 10/01/2021 10:31 AM

## 2021-10-01 NOTE — ED Notes (Signed)
Attempted to call group home for discharge again without success. Unable to leave VM. Will continue trying. Charge RN aware.

## 2021-10-01 NOTE — ED Notes (Signed)
Attempted to call 3 different numbers for group home. Mack answered, states someone will be here within the next hour to pick patient up.

## 2021-10-01 NOTE — ED Provider Notes (Signed)
The patient has been evaluated at bedside by NP Shaune Pollack, psychiatry.  Patient is clinically stable.  Not felt to be a danger to self or others.  No SI or Hi.  No indication for inpatient psychiatric admission at this time.  Appropriate for continued outpatient therapy.    Willy Eddy, MD 10/01/21 1041

## 2021-10-02 ENCOUNTER — Encounter: Payer: Self-pay | Admitting: Emergency Medicine

## 2021-10-02 ENCOUNTER — Emergency Department
Admission: EM | Admit: 2021-10-02 | Discharge: 2021-10-02 | Disposition: A | Discharge status: Home / Self Care | Payer: Medicare Other | Attending: Emergency Medicine | Admitting: Emergency Medicine

## 2021-10-02 ENCOUNTER — Other Ambulatory Visit: Payer: Self-pay

## 2021-10-02 DIAGNOSIS — I10 Essential (primary) hypertension: Secondary | ICD-10-CM | POA: Diagnosis not present

## 2021-10-02 DIAGNOSIS — R451 Restlessness and agitation: Secondary | ICD-10-CM | POA: Insufficient documentation

## 2021-10-02 DIAGNOSIS — F430 Acute stress reaction: Secondary | ICD-10-CM | POA: Diagnosis not present

## 2021-10-02 DIAGNOSIS — M25572 Pain in left ankle and joints of left foot: Secondary | ICD-10-CM | POA: Insufficient documentation

## 2021-10-02 DIAGNOSIS — M25571 Pain in right ankle and joints of right foot: Secondary | ICD-10-CM

## 2021-10-02 MED ORDER — ACETAMINOPHEN 325 MG PO TABS
650.0000 mg | ORAL_TABLET | Freq: Once | ORAL | Status: AC
  Administered 2021-10-02: 650 mg via ORAL
  Filled 2021-10-02: qty 2

## 2021-10-02 NOTE — ED Notes (Signed)
See triage note. Pt denies SI and HI. Pt states "the real reason I was upset was because I want a referral to the bone specialist for my broken ankle" and states that she broke her L ankle at [employer]. Pt also asking for food and drink. Pt resting sitting at side of bed.

## 2021-10-02 NOTE — ED Notes (Signed)
Group home will come to pick up pt in about 1 hour. They will call ascom number when they arrive.

## 2021-10-02 NOTE — ED Notes (Signed)
Now pt called nurse over and said "I'm not going to kill myself. I want to go home". Informed provider. Unable to reach group home.

## 2021-10-02 NOTE — ED Triage Notes (Addendum)
Pt via [facility] from [facility], pt was just discharged from the ED yesterday. States that staff members at the group home would not give her another dose of Tylenol for her ankle pain. States that she wants to see an orthopedic doctor. States she was upset and so she ran into the street, states she feels fine now but was just upset because they would not give her any Tylenol. Denies SI/HI. Pt is calm and cooperative during triage.

## 2021-10-02 NOTE — ED Notes (Signed)
EDP Paduchowski at bedside talking with pt, pt denies current SI, stating "that place treats me so well, I wanna go home".

## 2021-10-02 NOTE — ED Provider Notes (Addendum)
[facility] Provider Note    Event Date/Time   First MD Initiated Contact with Patient 10/02/21 1621     (approximate)  History   Chief Complaint: Agitation  HPI  Dawn Lambert is a 31 y.o. female with a past medical history of anxiety, hypertension, MR, mood disorder, presents to the emergency department for agitation.  Patient is coming from a group facility where she was agitated because the group home would not give her Tylenol.  Patient states she has been experiencing left ankle pain for the past 1 month since she sprained her ankle while shopping.  No SI or HI.  No other concerns today.  Denies any new injuries.  Physical Exam   Triage Vital Signs: ED Triage Vitals  Enc Vitals Group     BP 10/02/21 1553 114/73     Pulse Rate 10/02/21 1553 73     Resp 10/02/21 1553 20     Temp 10/02/21 1553 98.9 F (37.2 C)     Temp Source 10/02/21 1553 Oral     SpO2 10/02/21 1553 97 %     Weight 10/02/21 1554 275 lb 9.2 oz (125 kg)     Height 10/02/21 1554 5\' 3"  (1.6 m)     Head Circumference --      Peak Flow --      Pain Score 10/02/21 1554 10     Pain Loc --      Pain Edu? --      Excl. in [place]? --     Most recent vital signs: Vitals:   10/02/21 1553  BP: 114/73  Pulse: 73  Resp: 20  Temp: 98.9 F (37.2 C)  SpO2: 97%    General: Awake, no distress.  CV:  Good peripheral perfusion.  Regular rate and rhythm  Resp:  Normal effort.  Equal breath sounds bilaterally.  Abd:  No distention.  Soft, nontender.  No rebound or guarding. Other:  No tenderness palpation of the left ankle.  States it only hurts when she walks on it.   ED Results / Procedures / Treatments    MEDICATIONS ORDERED IN ED: Medications - No data to display   IMPRESSION / MDM / [place] / ED COURSE  I reviewed the triage vital signs and the nursing notes.  Patient's presentation is most consistent with acute, uncomplicated illness.  Patient presents emergency  department from her group facility.  Patient states she became very agitated at a group facility and walked out because they refused to give her Tylenol for her left ankle pain.  Patient states 1 month ago she sprained her left ankle and she has been experiencing pain ever since.  Patient states she has not seen an orthopedist or other specialist.  Continues to have pain when walking on the left ankle.  No tenderness to palpation, no swelling on my examination.  Neuro vastly intact.  No other acute complaints.  Patient is asking for food.  We will feed the patient does Tylenol I will refer to orthopedics per patient's request.  We will have the group home pick the patient back up.  Patient was seen in the emergency department yesterday and cleared by psychiatry.  The nurse notified me that the patient had stated that she is going to walk into traffic if we send her home.  I went and spoke to the patient and she said that we were going to give her a "supper tray" and that we send  her home she was going to walk into traffic.  I spoke to the patient told her that we already gave her a sandwich tray so we did not order her a dinner tray.  Patient states that the group home treats her very well and would likely give her dinner if she went home and now states she wants to go home and is denying any threat to herself.  States she is not going to hurt herself if we discharge her home.  Patient appears to be showing pretty clear malingering but now is denying any SI and says she wants to go home so she can eat dinner at home.  Patient was seen and cleared by psychiatry yesterday believe the patient still safe for discharge home today.  Group home is coming to pick up the patient.  FINAL CLINICAL IMPRESSION(S) / ED DIAGNOSES   Left ankle pain Agitation  Rx / DC Orders   Orthopedic follow-up  Note:  This document was prepared using Dragon voice recognition software and may include unintentional dictation errors.    Harvest Dark, MD 10/02/21 1636    Harvest Dark, MD 10/02/21 [phone]

## 2021-10-02 NOTE — ED Notes (Signed)
Per Dr. Lenard Lance, MD no new orders at this time and no changing out.

## 2021-10-02 NOTE — ED Notes (Signed)
Informed pt that group home is still coming for her.

## 2021-10-02 NOTE — ED Notes (Addendum)
Pt standing in hallway and saying out loud" I can't wait to go home and get killed." Pt then also states "I can't wait to go home and get killed in the road." Pt stating these comments multiple times and heard by multiple staff members. MD Pad informed.

## 2021-10-02 NOTE — ED Notes (Signed)
Provider will come talk again to pt.

## 2021-10-02 NOTE — ED Notes (Signed)
Provider spoke again to pt. Pt insists she is going to run into the road when she goes home to kill self. Pt was discharged yesterday. Provider informed pt that no more food will be provided here and he will have psych see her. She has stated same thing on other visits when being discharged to group home.

## 2021-10-02 NOTE — ED Notes (Signed)
Provider was at bedside.  Gave [place] sandwich tray and water to pt.

## 2021-10-02 NOTE — ED Notes (Signed)
Pt continues to call out loudly. Group home did just call and say they would be here in 15 min.

## 2021-10-02 NOTE — ED Notes (Signed)
Called back group home and spoke with [employer]. She will be here for pt in less than 1 hour.

## 2021-11-08 ENCOUNTER — Emergency Department
Admission: EM | Admit: 2021-11-08 | Discharge: 2021-11-08 | Disposition: A | Discharge status: Home / Self Care | Payer: Medicare Other | Attending: Emergency Medicine | Admitting: Emergency Medicine

## 2021-11-08 ENCOUNTER — Other Ambulatory Visit: Payer: Self-pay

## 2021-11-08 DIAGNOSIS — R079 Chest pain, unspecified: Secondary | ICD-10-CM | POA: Diagnosis present

## 2021-11-08 DIAGNOSIS — F4389 Other reactions to severe stress: Secondary | ICD-10-CM | POA: Insufficient documentation

## 2021-11-08 DIAGNOSIS — Z20822 Contact with and (suspected) exposure to covid-19: Secondary | ICD-10-CM | POA: Diagnosis not present

## 2021-11-08 DIAGNOSIS — F79 Unspecified intellectual disabilities: Secondary | ICD-10-CM

## 2021-11-08 DIAGNOSIS — R7309 Other abnormal glucose: Secondary | ICD-10-CM | POA: Insufficient documentation

## 2021-11-08 DIAGNOSIS — F430 Acute stress reaction: Secondary | ICD-10-CM | POA: Diagnosis present

## 2021-11-08 DIAGNOSIS — R45850 Homicidal ideations: Secondary | ICD-10-CM | POA: Diagnosis not present

## 2021-11-08 DIAGNOSIS — D72829 Elevated white blood cell count, unspecified: Secondary | ICD-10-CM | POA: Insufficient documentation

## 2021-11-08 DIAGNOSIS — R45851 Suicidal ideations: Secondary | ICD-10-CM | POA: Diagnosis not present

## 2021-11-08 LAB — COMPREHENSIVE METABOLIC PANEL
ALT: 20 U/L (ref 0–44)
AST: 26 U/L (ref 15–41)
Albumin: 3.9 g/dL (ref 3.5–5.0)
Alkaline Phosphatase: 80 U/L (ref 38–126)
Anion gap: 11 (ref 5–15)
BUN: 12 mg/dL (ref 6–20)
CO2: 24 mmol/L (ref 22–32)
Calcium: 9.5 mg/dL (ref 8.9–10.3)
Chloride: 101 mmol/L (ref 98–111)
Creatinine, Ser: 0.92 mg/dL (ref 0.44–1.00)
GFR, Estimated: >60 mL/min (ref >60)
Glucose, Bld: 207 mg/dL — ABNORMAL HIGH (ref 70–99)
Potassium: 3.7 mmol/L (ref 3.5–5.1)
Sodium: 136 mmol/L (ref 135–145)
Total Bilirubin: 0.7 mg/dL (ref 0.3–1.2)
Total Protein: 7.5 g/dL (ref 6.5–8.1)

## 2021-11-08 LAB — URINE DRUG SCREEN, QUALITATIVE (ARMC ONLY)
Amphetamines, Ur Screen: NOT DETECTED
Barbiturates, Ur Screen: NOT DETECTED
Benzodiazepine, Ur Scrn: NOT DETECTED
Cannabinoid 50 Ng, Ur ~~LOC~~: NOT DETECTED
Cocaine Metabolite,Ur ~~LOC~~: NOT DETECTED
MDMA (Ecstasy)Ur Screen: NOT DETECTED
Methadone Scn, Ur: NOT DETECTED
Opiate, Ur Screen: NOT DETECTED
Phencyclidine (PCP) Ur S: NOT DETECTED
Tricyclic, Ur Screen: POSITIVE — AB

## 2021-11-08 LAB — ACETAMINOPHEN LEVEL: Acetaminophen (Tylenol), Serum: <10 ug/mL — ABNORMAL LOW (ref 10–30)

## 2021-11-08 LAB — CBC
HCT: 32.3 % — ABNORMAL LOW (ref 36.0–46.0)
Hemoglobin: 10.2 g/dL — ABNORMAL LOW (ref 12.0–15.0)
MCH: 28.3 pg (ref 26.0–34.0)
MCHC: 31.6 g/dL (ref 30.0–36.0)
MCV: 89.5 fL (ref 80.0–100.0)
Platelets: 273 10*3/uL (ref 150–400)
RBC: 3.61 MIL/uL — ABNORMAL LOW (ref 3.87–5.11)
RDW: 12.8 % (ref 11.5–15.5)
WBC: 10.7 10*3/uL — ABNORMAL HIGH (ref 4.0–10.5)
nRBC: 0 % (ref 0.0–0.2)

## 2021-11-08 LAB — SARS CORONAVIRUS 2 BY RT PCR: SARS Coronavirus 2 by RT PCR: NEGATIVE

## 2021-11-08 LAB — SALICYLATE LEVEL: Salicylate Lvl: <7 mg/dL — ABNORMAL LOW (ref 7.0–30.0)

## 2021-11-08 LAB — ETHANOL: Alcohol, Ethyl (B): <10 mg/dL (ref <10)

## 2021-11-08 LAB — PREGNANCY, URINE: Preg Test, Ur: NEGATIVE

## 2021-11-08 NOTE — ED Notes (Signed)
Report received from [place], English as a second language teacher. Patient alert and oriented, warm and dry, and in no acute distress. Patient denies HI, AVH and pain. Patient made aware of Q15 minute rounds and Psychologist, counselling presence for their safety. Patient instructed to come to this nurse with needs or concerns.

## 2021-11-08 NOTE — ED Notes (Signed)
Hospital meal provided.  100% consumed, pt tolerated w/o complaints.  Waste discarded appropriately.   

## 2021-11-08 NOTE — ED Notes (Signed)
Pt is provided with belongings bag 1 of 1. Changing at this time.   This nurse has contacted [facility] for assistance with ride home to [facility]. Pt changing to allow for quick DC should PD arrive

## 2021-11-08 NOTE — ED Triage Notes (Signed)
Belongings:   Black dress Black shorts Pink/grey watch 1 yellow ring with a redstone

## 2021-11-08 NOTE — ED Notes (Signed)
[facility] iin route to pick up pt and take back to [facility]. [facility] is notified at this time

## 2021-11-08 NOTE — ED Notes (Signed)
Attempted to call [facility] back at [phone] and line is now busy

## 2021-11-08 NOTE — ED Notes (Signed)
Spoke to representative at [facility], she states she has to call her supervisor for transportation. Requests call back in a few minutes. Will return her call

## 2021-11-08 NOTE — ED Notes (Signed)
[facility] states that no ride will be available to pick up pt until after 7AM. [facility] lives not in area

## 2021-11-08 NOTE — ED Provider Notes (Signed)
[facility] Provider Note    Event Date/Time   First MD Initiated Contact with Patient 11/08/21 1542     (approximate)   History   Suicidal and Homicidal   HPI  Dawn Lambert is a 31 y.o. female who comes in from her group home due to having SI and HI.  Patient was brought in by the police but is voluntary at this time.  Patient reported that she "cut the people up at the group home".  Patient reports that she did lay herself down on the ground because she want to roll herself into traffic but she denies falling to the ground hitting her head or being hit by traffic or having any injuries.  She otherwise feels at her baseline self.  Physical Exam   Triage Vital Signs: ED Triage Vitals  Enc Vitals Group     BP 11/08/21 1504 (!) 140/95     Pulse Rate 11/08/21 1504 93     Resp 11/08/21 1504 17     Temp 11/08/21 1504 98.6 F (37 C)     Temp Source 11/08/21 1504 Oral     SpO2 11/08/21 1504 98 %     Weight --      Height --      Head Circumference --      Peak Flow --      Pain Score 11/08/21 1505 0     Pain Loc --      Pain Edu? --      Excl. in GC? --     Most recent vital signs: Vitals:   11/08/21 1504  BP: (!) 140/95  Pulse: 93  Resp: 17  Temp: 98.6 F (37 C)  SpO2: 98%     General: Awake, no distress.  CV:  Good peripheral perfusion.  Resp:  Normal effort.  Abd:  No distention.  Other:  No evidence of trauma to the head.  She moves all extremities well.   no chest wall tenderness no abdominal tenderness or evidence of injury.   ED Results / Procedures / Treatments   Labs (all labs ordered are listed, but only abnormal results are displayed) Labs Reviewed  COMPREHENSIVE METABOLIC PANEL - Abnormal; Notable for the following components:      Result Value   Glucose, Bld 207 (*)    All other components within normal limits  CBC - Abnormal; Notable for the following components:   WBC 10.7 (*)    RBC 3.61 (*)    Hemoglobin  10.2 (*)    HCT 32.3 (*)    All other components within normal limits  ETHANOL  SALICYLATE LEVEL  ACETAMINOPHEN LEVEL  URINE DRUG SCREEN, QUALITATIVE ([facility] ONLY)  POC URINE PREG, ED    MEDICATIONS ORDERED IN ED: Medications - No data to display   IMPRESSION / MDM / ASSESSMENT AND PLAN / ED COURSE  I reviewed the triage vital signs and the nursing notes.   Patient's presentation is most consistent with acute presentation with potential threat to life or bodily function.   Pt is without any acute medical complaints. No exam findings to suggest medical cause of current presentation. Will order psychiatric screening labs and discuss further w/ psychiatric service.  CBC shows stable hemoglobin.  Slightly elevated white count but similar to prior no other infectious symptoms.  CMP shows slightly elevated glucose at 207- salicylate/tylenol negative   D/d includes but is not limited to psychiatric disease, behavioral/personality disorder, inadequate socioeconomic support, medical.  Based on HPI, exam, unremarkable labs, no concern for acute medical problem at this time. No rigidity, clonus, hyperthermia, focal neurologic deficit, diaphoresis, tachycardia, meningismus, ataxia, gait abnormality or other finding to suggest this visit represents a non-psychiatric problem. Screening labs reviewed.    Given this, pt medically cleared, to be dispositioned per Psych.    The patient has been placed in psychiatric observation due to the need to provide a safe environment for the patient while obtaining psychiatric consultation and evaluation, as well as ongoing medical and medication management to treat the patient's condition.  The patient has not been placed under full IVC at this time.    The patient is on the cardiac monitor to evaluate for evidence of arrhythmia and/or significant heart rate changes.      FINAL CLINICAL IMPRESSION(S) / ED DIAGNOSES   Final diagnoses:  Suicidal ideation      Rx / DC Orders   ED Discharge Orders     None        Note:  This document was prepared using Dragon voice recognition software and may include unintentional dictation errors.   Concha Se, MD 11/08/21 [phone]

## 2021-11-08 NOTE — Consult Note (Signed)
[facility] Face-to-Face Psychiatry Consult   Reason for Consult: Suicidal and Homicidal Referring Physician: Dr. Fuller Plan Patient Identification: Dawn Lambert MRN:  [id number] Principal Diagnosis: <principal problem not specified> Diagnosis:  Active Problems:   Chest pain   Intellectual disability   Suicidal ideation   Stress reaction causing mixed disturbance of emotion and conduct   Total Time spent with patient: 1 hour  Subjective: "I don't like my group home. I don't want to live there anymore."  Dawn Lambert is a 31 y.o. female patient presented to [facility] ED via law enforcement voluntarily. Per the ED triage nurses note, the Pt presents to ED with c/o of having SI and HI due to not liking her group home due to " they will never let me out of there". Pt brought in by [employer] but is voluntary at this time.  Pt also reports she wants to "cut the people up at the group home".  Renee, pt legal guardian contacted and made them aware pt is here and consent to treat was obtained by verbal telephone report.  Pt calm and cooperative in triage at this time.  Pt dressed out with no issues.  Pt found to be laying on the side of the road by [employer] and pt states "I want to roll out in the road and be run over by a car" The patient lives in a group home, and she should be monitored. The patient shared that she does not like her group home. The patient could not identify any reason as to why she does not like her group home. The patient was educated on what could happen if she got hit by a car. The patient was made aware that not always a person dies. She could become injured and be in a worse situation.   This provider saw The patient face-to-face; the chart was reviewed, and consulted with Dr. Fuller Plan on 11/08/2021 due to the patient's care. It was discussed with the EDP that the patient does not meet the criteria to be admitted to the psychiatric inpatient unit.  On evaluation, the patient is alert and oriented  x 4, calm, cooperative, and mood-congruent with affect. The patient does not appear to be responding to internal or external stimuli. Neither is the patient presenting with any delusional thinking. The patient denies auditory or visual hallucinations. The patient admits to suicidal ideations but denies homicidal or self-harm ideations. The patient is not presenting with any psychotic or paranoid behaviors. During an encounter with the patient, she could answer questions appropriately.  HPI: Per Dr. Fuller Plan, Dawn Lambert is a 31 y.o. female who comes in from her group home due to having SI and HI.  Patient was brought in by the police but is voluntary at this time.  Patient reported that she "cut the people up at the group home".  Patient reports that she did lay herself down on the ground because she want to roll herself into traffic but she denies falling to the ground hitting her head or being hit by traffic or having any injuries.  She otherwise feels at her baseline self.  Past Psychiatric History:  Anxiety Mental retardation Mood disorder (HCC) Overweight(278.02)   Risk to Self:   Risk to Others:   Prior Inpatient Therapy:   Prior Outpatient Therapy:    Past Medical History:  Past Medical History:  Diagnosis Date   Anxiety    Arnold-Chiari malformation (HCC)    Atrial septal defect    Complication  of anesthesia    when pt was 11, woke up during surgery (muscle biopsy)   Constipation    Contraception management    Dandruff    Dermatomyositis [facility])    age 31   Diabetes mellitus without complication (HCC)    Elevated blood pressure    Hypertension    Hypothyroidism    Mental retardation    Mood disorder (HCC)    Overweight(278.02)    Pelvic pain    Prediabetes     Past Surgical History:  Procedure Laterality Date   ASD REPAIR     at age 40   UMBILICAL HERNIA REPAIR N/A 09/17/2017   Procedure: HERNIA REPAIR UMBILICAL ADULT;  Surgeon: Earline Mayotte, MD;  Location: [facility]  ORS;  Service: General;  Laterality: N/A;   Family History: History reviewed. No pertinent family history. Family Psychiatric  History:  Social History:  Social History   Substance and Sexual Activity  Alcohol Use No     Social History   Substance and Sexual Activity  Drug Use No    Social History   Socioeconomic History   Marital status: Single    Spouse name: Not on file   Number of children: Not on file   Years of education: Not on file   Highest education level: Not on file  Occupational History   Not on file  Tobacco Use   Smoking status: Never   Smokeless tobacco: Never  Vaping Use   Vaping Use: Never used  Substance and Sexual Activity   Alcohol use: No   Drug use: No   Sexual activity: Not on file  Other Topics Concern   Not on file  Social History Narrative   Not on file   Social Determinants of Health   Financial Resource Strain: Not on file  Food Insecurity: Not on file  Transportation Needs: Not on file  Physical Activity: Not on file  Stress: Not on file  Social Connections: Not on file   Additional Social History:    Allergies:   Allergies  Allergen Reactions   Augmentin [Amoxicillin-Pot Clavulanate] Other (See Comments)    Has patient had a PCN reaction causing immediate rash, facial/tongue/throat swelling, SOB or lightheadedness with hypotension: ______ Has patient had a PCN reaction causing severe rash involving mucus membranes or skin necrosis: _______ Has patient had a PCN reaction that required hospitalization: _______ Has patient had a PCN reaction occurring within the last 10 years:_______ If all of the above answers are "NO", then may proceed with Cephalosporin use.     Labs:  Results for orders placed or performed during the hospital encounter of 11/08/21 (from the past 48 hour(s))  Comprehensive metabolic panel     Status: Abnormal   Collection Time: 11/08/21  3:05 PM  Result Value Ref Range   Sodium 136 135 - 145 mmol/L    Potassium 3.7 3.5 - 5.1 mmol/L   Chloride 101 98 - 111 mmol/L   CO2 24 22 - 32 mmol/L   Glucose, Bld 207 (H) 70 - 99 mg/dL    Comment: Glucose reference range applies only to samples taken after fasting for at least 8 hours.   BUN 12 6 - 20 mg/dL   Creatinine, Ser 1.61 0.44 - 1.00 mg/dL   Calcium 9.5 8.9 - 09.6 mg/dL   Total Protein 7.5 6.5 - 8.1 g/dL   Albumin 3.9 3.5 - 5.0 g/dL   AST 26 15 - 41 U/L   ALT 20 0 - 44  U/L   Alkaline Phosphatase 80 38 - 126 U/L   Total Bilirubin 0.7 0.3 - 1.2 mg/dL   GFR, Estimated >93 >79 mL/min    Comment: (NOTE) Calculated using the CKD-EPI Creatinine Equation (2021)    Anion gap 11 5 - 15    Comment: Performed at [facility], [address] [place], [name] [address]  Ethanol     Status: None   Collection Time: 11/08/21  3:05 PM  Result Value Ref Range   Alcohol, Ethyl (B) <10 <10 mg/dL    Comment: (NOTE) Lowest detectable limit for serum alcohol is 10 mg/dL.  For medical purposes only. Performed at [facility], [address] [place], [name] [address]   Salicylate level     Status: Abnormal   Collection Time: 11/08/21  3:05 PM  Result Value Ref Range   Salicylate Lvl <7.0 (L) 7.0 - 30.0 mg/dL    Comment: Performed at [facility], [address] [place], [name] [address]  Acetaminophen level     Status: Abnormal   Collection Time: 11/08/21  3:05 PM  Result Value Ref Range   Acetaminophen (Tylenol), Serum <10 (L) 10 - 30 ug/mL    Comment: (NOTE) Therapeutic concentrations vary significantly. A range of 10-30 ug/mL  may be an effective concentration for many patients. However, some  are best treated at concentrations outside of this range. Acetaminophen concentrations >150 ug/mL at 4 hours after ingestion  and >50 ug/mL at 12 hours after ingestion are often associated with  toxic reactions.  Performed at [facility], [address] [place], [name] [address]   cbc     Status: Abnormal    Collection Time: 11/08/21  3:05 PM  Result Value Ref Range   WBC 10.7 (H) 4.0 - 10.5 K/uL   RBC 3.61 (L) 3.87 - 5.11 MIL/uL   Hemoglobin 10.2 (L) 12.0 - 15.0 g/dL   HCT 96.2 (L) 22.9 - 79.8 %   MCV 89.5 80.0 - 100.0 fL   MCH 28.3 26.0 - 34.0 pg   MCHC 31.6 30.0 - 36.0 g/dL   RDW 92.1 19.4 - 17.4 %   Platelets 273 150 - 400 K/uL   nRBC 0.0 0.0 - 0.2 %    Comment: Performed at [facility], [address] [place], [name] [address]  Urine Drug Screen, Qualitative     Status: Abnormal   Collection Time: 11/08/21  3:30 PM  Result Value Ref Range   Tricyclic, Ur Screen POSITIVE (A) NONE DETECTED   Amphetamines, Ur Screen NONE DETECTED NONE DETECTED   MDMA (Ecstasy)Ur Screen NONE DETECTED NONE DETECTED   Cocaine Metabolite,Ur [place] NONE DETECTED NONE DETECTED   Opiate, Ur Screen NONE DETECTED NONE DETECTED   Phencyclidine (PCP) Ur S NONE DETECTED NONE DETECTED   Cannabinoid 50 Ng, Ur [place] NONE DETECTED NONE DETECTED   Barbiturates, Ur Screen NONE DETECTED NONE DETECTED   Benzodiazepine, Ur Scrn NONE DETECTED NONE DETECTED   Methadone Scn, Ur NONE DETECTED NONE DETECTED    Comment: (NOTE) Tricyclics + metabolites, urine    Cutoff 1000 ng/mL Amphetamines + metabolites, urine  Cutoff 1000 ng/mL MDMA (Ecstasy), urine              Cutoff 500 ng/mL Cocaine Metabolite, urine          Cutoff 300 ng/mL Opiate + metabolites, urine        Cutoff 300 ng/mL Phencyclidine (PCP), urine  Cutoff 25 ng/mL Cannabinoid, urine                 Cutoff 50 ng/mL Barbiturates + metabolites, urine  Cutoff 200 ng/mL Benzodiazepine, urine              Cutoff 200 ng/mL Methadone, urine                   Cutoff 300 ng/mL  The urine drug screen provides only a preliminary, unconfirmed analytical test result and should not be used for non-medical purposes. Clinical consideration and professional judgment should be applied to any positive drug screen result due to possible interfering substances. A  more specific alternate chemical method must be used in order to obtain a confirmed analytical result. Gas chromatography / mass spectrometry (GC/MS) is the preferred confirm atory method. Performed at [facility], [address] [place], [name] [address]   Pregnancy, urine     Status: None   Collection Time: 11/08/21  3:30 PM  Result Value Ref Range   Preg Test, Ur NEGATIVE NEGATIVE    Comment: Performed at [facility], [address] [place], [name] [address]  SARS Coronavirus 2 by RT PCR (hospital order, performed in [facility] hospital lab) *cepheid single result test* Anterior Nasal Swab     Status: None   Collection Time: 11/08/21  8:29 PM   Specimen: Anterior Nasal Swab  Result Value Ref Range   SARS Coronavirus 2 by RT PCR NEGATIVE NEGATIVE    Comment: (NOTE) SARS-CoV-2 target nucleic acids are NOT DETECTED.  The SARS-CoV-2 RNA is generally detectable in upper and lower respiratory specimens during the acute phase of infection. The lowest concentration of SARS-CoV-2 viral copies this assay can detect is 250 copies / mL. A negative result does not preclude SARS-CoV-2 infection and should not be used as the sole basis for treatment or other patient management decisions.  A negative result may occur with improper specimen collection / handling, submission of specimen other than nasopharyngeal swab, presence of viral mutation(s) within the areas targeted by this assay, and inadequate number of viral copies (<250 copies / mL). A negative result must be combined with clinical observations, patient history, and epidemiological information.  Fact Sheet for Patients:   RoadLapTop.co.za  Fact Sheet for Healthcare Providers: http://kim-miller.com/  This test is not yet approved or  cleared by the [place] FDA and has been authorized for detection and/or diagnosis of SARS-CoV-2 by FDA under an Emergency  Use Authorization (EUA).  This EUA will remain in effect (meaning this test can be used) for the duration of the COVID-19 declaration under Section 564(b)(1) of the Act, 21 U.S.C. section 360bbb-3(b)(1), unless the authorization is terminated or revoked sooner.  Performed at [facility], [address] [place], [name] [address]     No current facility-administered medications for this encounter.   Current Outpatient Medications  Medication Sig Dispense Refill   acetaminophen (TYLENOL) 325 MG tablet Take 650 mg by mouth every 6 (six) hours as needed for mild pain or moderate pain.     albuterol (VENTOLIN HFA) 108 (90 Base) MCG/ACT inhaler Inhale 2 puffs into the lungs every 4 (four) hours as needed.     atorvastatin (LIPITOR) 10 MG tablet Take 10 mg by mouth at bedtime.     carbamazepine (TEGRETOL) 200 MG tablet Take 400 mg by mouth 2 (two) times daily.     cholecalciferol (VITAMIN D) 1000 units tablet Take 1,000 Units by  mouth daily.      clonazePAM (KLONOPIN) 1 MG tablet Take 1 mg by mouth 3 (three) times daily.      gabapentin (NEURONTIN) 600 MG tablet Take 600 mg by mouth 3 (three) times daily.     levothyroxine (SYNTHROID, LEVOTHROID) 175 MCG tablet Take 175 mcg by mouth daily before breakfast.     lisinopril (PRINIVIL,ZESTRIL) 20 MG tablet Take 20 mg by mouth at bedtime.     lithium carbonate 300 MG capsule Take 900 mg by mouth at bedtime.     LORazepam ER (LOREEV XR) 2 MG CS24 Take 2 mg by mouth in the morning.     metFORMIN (GLUCOPHAGE) 500 MG tablet Take 1,000 mg by mouth 2 (two) times daily.      Multiple Vitamin (THEREMS PO) Take 1 tablet by mouth daily.     propranolol (INDERAL) 40 MG tablet Take 40 mg by mouth 2 (two) times daily.     QUEtiapine (SEROQUEL) 25 MG tablet Take 1 tablet (25 mg total) by mouth 3 (three) times daily with meals. (Patient taking differently: Take 50 mg by mouth daily.) 90 tablet 2    Musculoskeletal: Strength & Muscle Tone: within  normal limits Gait & Station: normal Patient leans: N/A  Psychiatric Specialty Exam:  Presentation  General Appearance: Appropriate for Environment  Eye Contact:Fair  Speech:Clear and Coherent  Speech Volume:Decreased  Handedness:Right   Mood and Affect  Mood:Euthymic  Affect:Labile   Thought Process  Thought Processes:Coherent  Descriptions of Associations:Circumstantial  Orientation:Full (Time, Place and Person)  Thought Content:Illogical  History of Schizophrenia/Schizoaffective disorder:No  Duration of Psychotic Symptoms:No data recorded Hallucinations:Hallucinations: None  Ideas of Reference:None  Suicidal Thoughts:Suicidal Thoughts: Yes, Active SI Active Intent and/or Plan: With Plan  Homicidal Thoughts:Homicidal Thoughts: No   Sensorium  Memory:Immediate Fair; Recent Fair; Remote Fair  Judgment:Poor  Insight:Poor   Executive Functions  Concentration:Fair  Attention Span:Fair  Recall:Fair  Fund of Knowledge:Fair  Language:Fair   Psychomotor Activity  Psychomotor Activity:Psychomotor Activity: Normal   Assets  Assets:Communication Skills; Desire for Improvement; Social Support; Resilience   Sleep  Sleep:Sleep: Good   Physical Exam: Physical Exam Vitals and nursing note reviewed.  Constitutional:      Appearance: Normal appearance. She is obese.  HENT:     Head: Normocephalic and atraumatic.     Right Ear: External ear normal.     Left Ear: External ear normal.     Nose: Nose normal.  Cardiovascular:     Rate and Rhythm: Normal rate.     Pulses: Normal pulses.  Pulmonary:     Effort: Pulmonary effort is normal.  Musculoskeletal:        General: Normal range of motion.     Cervical back: Normal range of motion and neck supple.  Neurological:     General: No focal deficit present.     Mental Status: She is alert and oriented to person, place, and time.  Psychiatric:        Attention and Perception: Attention and  perception normal.        Mood and Affect: Mood is depressed. Affect is flat.        Speech: Speech normal.        Behavior: Behavior is withdrawn. Behavior is cooperative.        Thought Content: Thought content includes suicidal ideation.        Cognition and Memory: Cognition and memory normal.        Judgment: Judgment normal.  Review of Systems  Psychiatric/Behavioral:  Positive for depression and suicidal ideas.   All other systems reviewed and are negative.  Blood pressure 120/74, pulse 75, temperature 98.8 F (37.1 C), resp. rate 18, SpO2 95 %. There is no height or weight on file to calculate BMI.  Treatment Plan Summary: Plan Patient does meet criteria for psychiatric inpatient admission  Disposition: No evidence of imminent risk to self or others at present.   Patient does not meet criteria for psychiatric inpatient admission. Supportive therapy provided about ongoing stressors. Discussed crisis plan, support from social network, calling 911, coming to the Emergency Department, and calling Suicide Hotline.  Gillermo MurdochJacqueline Pearlina Friedly, NP 11/08/2021 11:33 PM
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
## 2021-11-08 NOTE — Discharge Instructions (Signed)
Psychiatry cleared her to go home but you need to carefully monitor her to prevent her from having these incidents.

## 2021-11-27 ENCOUNTER — Encounter: Payer: Self-pay | Admitting: Emergency Medicine

## 2021-11-27 ENCOUNTER — Other Ambulatory Visit: Payer: Self-pay

## 2021-11-27 ENCOUNTER — Emergency Department: Payer: Medicare Other

## 2021-11-27 ENCOUNTER — Emergency Department
Admission: EM | Admit: 2021-11-27 | Discharge: 2021-11-28 | Disposition: A | Discharge status: Home / Self Care | Payer: Medicare Other | Attending: Emergency Medicine | Admitting: Emergency Medicine

## 2021-11-27 DIAGNOSIS — D649 Anemia, unspecified: Secondary | ICD-10-CM | POA: Insufficient documentation

## 2021-11-27 DIAGNOSIS — D72829 Elevated white blood cell count, unspecified: Secondary | ICD-10-CM | POA: Diagnosis not present

## 2021-11-27 DIAGNOSIS — R102 Pelvic and perineal pain unspecified side: Secondary | ICD-10-CM

## 2021-11-27 DIAGNOSIS — F32A Depression, unspecified: Secondary | ICD-10-CM | POA: Diagnosis not present

## 2021-11-27 DIAGNOSIS — I10 Essential (primary) hypertension: Secondary | ICD-10-CM | POA: Insufficient documentation

## 2021-11-27 DIAGNOSIS — R4689 Other symptoms and signs involving appearance and behavior: Secondary | ICD-10-CM | POA: Diagnosis not present

## 2021-11-27 DIAGNOSIS — F4389 Other reactions to severe stress: Secondary | ICD-10-CM | POA: Diagnosis not present

## 2021-11-27 DIAGNOSIS — E039 Hypothyroidism, unspecified: Secondary | ICD-10-CM | POA: Insufficient documentation

## 2021-11-27 DIAGNOSIS — F430 Acute stress reaction: Secondary | ICD-10-CM | POA: Diagnosis present

## 2021-11-27 DIAGNOSIS — Z703 Counseling related to combined concerns regarding sexual attitude, behavior and orientation: Secondary | ICD-10-CM | POA: Insufficient documentation

## 2021-11-27 DIAGNOSIS — R45851 Suicidal ideations: Secondary | ICD-10-CM

## 2021-11-27 DIAGNOSIS — E119 Type 2 diabetes mellitus without complications: Secondary | ICD-10-CM | POA: Diagnosis not present

## 2021-11-27 DIAGNOSIS — F79 Unspecified intellectual disabilities: Secondary | ICD-10-CM | POA: Diagnosis not present

## 2021-11-27 LAB — CBC
HCT: 33 % — ABNORMAL LOW (ref 36.0–46.0)
Hemoglobin: 10.6 g/dL — ABNORMAL LOW (ref 12.0–15.0)
MCH: 28.6 pg (ref 26.0–34.0)
MCHC: 32.1 g/dL (ref 30.0–36.0)
MCV: 89.2 fL (ref 80.0–100.0)
Platelets: 322 10*3/uL (ref 150–400)
RBC: 3.7 MIL/uL — ABNORMAL LOW (ref 3.87–5.11)
RDW: 13.2 % (ref 11.5–15.5)
WBC: 11.1 10*3/uL — ABNORMAL HIGH (ref 4.0–10.5)
nRBC: 0 % (ref 0.0–0.2)

## 2021-11-27 LAB — URINALYSIS, ROUTINE W REFLEX MICROSCOPIC
Bilirubin Urine: NEGATIVE
Glucose, UA: NEGATIVE mg/dL
Ketones, ur: NEGATIVE mg/dL
Leukocytes,Ua: NEGATIVE
Nitrite: NEGATIVE
Protein, ur: NEGATIVE mg/dL
Specific Gravity, Urine: 1.004 — ABNORMAL LOW (ref 1.005–1.030)
pH: 6 (ref 5.0–8.0)

## 2021-11-27 LAB — URINE DRUG SCREEN, QUALITATIVE (ARMC ONLY)
Amphetamines, Ur Screen: NOT DETECTED
Barbiturates, Ur Screen: NOT DETECTED
Benzodiazepine, Ur Scrn: POSITIVE — AB
Cannabinoid 50 Ng, Ur ~~LOC~~: NOT DETECTED
Cocaine Metabolite,Ur ~~LOC~~: NOT DETECTED
MDMA (Ecstasy)Ur Screen: NOT DETECTED
Methadone Scn, Ur: NOT DETECTED
Opiate, Ur Screen: NOT DETECTED
Phencyclidine (PCP) Ur S: NOT DETECTED
Tricyclic, Ur Screen: NOT DETECTED

## 2021-11-27 LAB — COMPREHENSIVE METABOLIC PANEL
ALT: 18 U/L (ref 0–44)
AST: 25 U/L (ref 15–41)
Albumin: 4 g/dL (ref 3.5–5.0)
Alkaline Phosphatase: 85 U/L (ref 38–126)
Anion gap: 12 (ref 5–15)
BUN: 12 mg/dL (ref 6–20)
CO2: 23 mmol/L (ref 22–32)
Calcium: 9.6 mg/dL (ref 8.9–10.3)
Chloride: 106 mmol/L (ref 98–111)
Creatinine, Ser: 0.99 mg/dL (ref 0.44–1.00)
GFR, Estimated: >60 mL/min (ref >60)
Glucose, Bld: 149 mg/dL — ABNORMAL HIGH (ref 70–99)
Potassium: 3.7 mmol/L (ref 3.5–5.1)
Sodium: 141 mmol/L (ref 135–145)
Total Bilirubin: 0.5 mg/dL (ref 0.3–1.2)
Total Protein: 7.9 g/dL (ref 6.5–8.1)

## 2021-11-27 LAB — CHLAMYDIA/NGC RT PCR (ARMC ONLY)
Chlamydia Tr: NOT DETECTED
N gonorrhoeae: NOT DETECTED

## 2021-11-27 LAB — ACETAMINOPHEN LEVEL: Acetaminophen (Tylenol), Serum: <10 ug/mL — ABNORMAL LOW (ref 10–30)

## 2021-11-27 LAB — PREGNANCY, URINE: Preg Test, Ur: NEGATIVE

## 2021-11-27 LAB — ETHANOL: Alcohol, Ethyl (B): <10 mg/dL (ref <10)

## 2021-11-27 LAB — LITHIUM LEVEL: Lithium Lvl: 0.8 mmol/L (ref 0.60–1.20)

## 2021-11-27 LAB — SALICYLATE LEVEL: Salicylate Lvl: <7 mg/dL — ABNORMAL LOW (ref 7.0–30.0)

## 2021-11-27 MED ORDER — ACETAMINOPHEN 325 MG PO TABS: 650.0000 mg | ORAL_TABLET | Freq: Four times a day (QID) | ORAL | Status: DC | PRN

## 2021-11-27 MED ORDER — METFORMIN HCL 500 MG PO TABS: 1000.0000 mg | ORAL_TABLET | Freq: Two times a day (BID) | ORAL | Status: DC

## 2021-11-27 MED ORDER — PROPRANOLOL HCL 40 MG PO TABS
40.0000 mg | ORAL_TABLET | Freq: Two times a day (BID) | ORAL | Status: DC
  Administered 2021-11-27 – 2021-11-28 (×2): 40 mg via ORAL
  Filled 2021-11-27: qty 1
  Filled 2021-11-27: qty 2

## 2021-11-27 MED ORDER — LEVOTHYROXINE SODIUM 75 MCG PO TABS
175.0000 ug | ORAL_TABLET | Freq: Every day | ORAL | Status: DC
  Administered 2021-11-28: 175 ug via ORAL
  Filled 2021-11-27: qty 1

## 2021-11-27 MED ORDER — METFORMIN HCL 500 MG PO TABS
1000.0000 mg | ORAL_TABLET | Freq: Two times a day (BID) | ORAL | Status: DC
Start: 2021-11-28 — End: 2021-11-28
  Administered 2021-11-28: 1000 mg via ORAL
  Filled 2021-11-27: qty 2

## 2021-11-27 MED ORDER — CARBAMAZEPINE 200 MG PO TABS
400.0000 mg | ORAL_TABLET | Freq: Two times a day (BID) | ORAL | Status: DC
  Administered 2021-11-27 – 2021-11-28 (×2): 400 mg via ORAL
  Filled 2021-11-27 (×2): qty 2

## 2021-11-27 MED ORDER — ATORVASTATIN CALCIUM 20 MG PO TABS
10.0000 mg | ORAL_TABLET | Freq: Every day | ORAL | Status: DC
Start: 2021-11-27 — End: 2021-11-28
  Administered 2021-11-27: 10 mg via ORAL
  Filled 2021-11-27: qty 1

## 2021-11-27 MED ORDER — VITAMIN D 25 MCG (1000 UNIT) PO TABS
1000.0000 [IU] | ORAL_TABLET | Freq: Every day | ORAL | Status: DC
  Administered 2021-11-27 – 2021-11-28 (×2): 1000 [IU] via ORAL
  Filled 2021-11-27 (×2): qty 1

## 2021-11-27 MED ORDER — LISINOPRIL 20 MG PO TABS
20.0000 mg | ORAL_TABLET | Freq: Every day | ORAL | Status: DC
  Administered 2021-11-27: 20 mg via ORAL
  Filled 2021-11-27: qty 2

## 2021-11-27 MED ORDER — GABAPENTIN 600 MG PO TABS
600.0000 mg | ORAL_TABLET | Freq: Three times a day (TID) | ORAL | Status: DC
  Administered 2021-11-27 – 2021-11-28 (×2): 600 mg via ORAL
  Filled 2021-11-27 (×2): qty 1

## 2021-11-27 MED ORDER — CLONAZEPAM 1 MG PO TABS
1.0000 mg | ORAL_TABLET | Freq: Three times a day (TID) | ORAL | Status: DC
  Administered 2021-11-27 – 2021-11-28 (×2): 1 mg via ORAL
  Filled 2021-11-27: qty 2
  Filled 2021-11-27: qty 1

## 2021-11-27 MED ORDER — ADULT MULTIVITAMIN W/MINERALS CH
ORAL_TABLET | Freq: Every day | ORAL | Status: DC
  Filled 2021-11-27: qty 1

## 2021-11-27 MED ORDER — ALBUTEROL SULFATE (2.5 MG/3ML) 0.083% IN NEBU: 3.0000 mL | INHALATION_SOLUTION | RESPIRATORY_TRACT | Status: DC | PRN

## 2021-11-27 MED ORDER — LEVOTHYROXINE SODIUM 50 MCG PO TABS: 175.0000 ug | ORAL_TABLET | Freq: Every day | ORAL | Status: DC

## 2021-11-27 NOTE — ED Notes (Signed)
Patient difficulty lab draw.  Lab called to come attempt.
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
## 2021-11-27 NOTE — ED Notes (Signed)
Snack and beverage given. 
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
## 2021-11-27 NOTE — ED Notes (Signed)
Pt refusing pelvic ultrasound at this time

## 2021-11-27 NOTE — ED Notes (Signed)
Pt given dinner tray.

## 2021-11-27 NOTE — ED Notes (Signed)
Pt agreeable to do external ultrasound

## 2021-11-27 NOTE — ED Notes (Signed)
Patient dressed out at this time with Pharmacist, hospital. Belongings include: Pink shirt Black pants Black and silver flip flops Grey bra Grey watch x2 pink bracelets Pink earrings Silver necklace Pink underwear  x1 belongings bag handed to primary RN.
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
## 2021-11-27 NOTE — ED Notes (Signed)
Pt. Transferred to BHU from ED to room 3 after screening for contraband. Report to include Situation, Background, Assessment and Recommendations from [facility]. Pt. Oriented to unit including Q15 minute rounds as well as the security cameras for their protection. Patient is alert and oriented, warm and dry in no acute distress. Patient report SI without a plan. Contracted for safety. Denied HI, and AVH. Pt. Encouraged to let me know if needs arise.
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
## 2021-11-28 DIAGNOSIS — R4689 Other symptoms and signs involving appearance and behavior: Secondary | ICD-10-CM | POA: Diagnosis not present

## 2021-11-28 DIAGNOSIS — F32A Depression, unspecified: Secondary | ICD-10-CM | POA: Diagnosis not present

## 2021-11-28 DIAGNOSIS — F430 Acute stress reaction: Secondary | ICD-10-CM | POA: Diagnosis not present

## 2021-11-28 DIAGNOSIS — F259 Schizoaffective disorder, unspecified: Secondary | ICD-10-CM | POA: Insufficient documentation

## 2021-11-28 DIAGNOSIS — F79 Unspecified intellectual disabilities: Secondary | ICD-10-CM | POA: Diagnosis not present

## 2021-11-28 DIAGNOSIS — F251 Schizoaffective disorder, depressive type: Secondary | ICD-10-CM | POA: Insufficient documentation

## 2021-11-28 DIAGNOSIS — F4389 Other reactions to severe stress: Secondary | ICD-10-CM | POA: Diagnosis not present

## 2021-11-28 DIAGNOSIS — R45851 Suicidal ideations: Secondary | ICD-10-CM

## 2021-11-28 LAB — URINE CULTURE: Culture: <10000 — AB

## 2021-11-28 NOTE — ED Notes (Signed)
Hospital meal provided, pt tolerated w/o complaints.  Waste discarded appropriately.  

## 2021-11-28 NOTE — ED Notes (Signed)
Ask and was given water. Pt questioning plan for today. I told her that I understood that she would be discharged today but I would let her know after breakfast. Pt accepted that.
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
## 2021-11-28 NOTE — ED Notes (Signed)
Talked to Cristela Blue, pts guardian & mom, via phone. Ms. Forness aware of discharge and ok with pt going back to [facility] group home.

## 2021-11-28 NOTE — ED Provider Notes (Signed)
Emergency Medicine Observation Re-evaluation Note  Dawn Lambert is a 31 y.o. female, seen on rounds today.  Pt initially presented to the ED for complaints of Mental Health Problem Currently, the patient is calm, resting.  Physical Exam  BP 125/80   Pulse 69   Temp 97.8 F (36.6 C)   Resp 20   Ht 5\' 3"  (1.6 m)   Wt 126.1 kg   SpO2 96%   BMI 49.25 kg/m    ED Course / MDM  EKG:   I have reviewed the labs performed to date as well as medications administered while in observation.  Recent changes in the last 24 hours include none.  Plan  Current plan is for psych dispo.  is not under involuntary commitment.     [name], MD 11/28/21 [id number]

## 2021-12-11 ENCOUNTER — Emergency Department
Admission: EM | Admit: 2021-12-11 | Discharge: 2021-12-12 | Disposition: A | Discharge status: Home / Self Care | Payer: Medicare Other | Attending: Emergency Medicine | Admitting: Emergency Medicine

## 2021-12-11 ENCOUNTER — Encounter: Payer: Self-pay | Admitting: Emergency Medicine

## 2021-12-11 ENCOUNTER — Other Ambulatory Visit: Payer: Self-pay

## 2021-12-11 DIAGNOSIS — Z79899 Other long term (current) drug therapy: Secondary | ICD-10-CM | POA: Insufficient documentation

## 2021-12-11 DIAGNOSIS — Z046 Encounter for general psychiatric examination, requested by authority: Secondary | ICD-10-CM | POA: Diagnosis present

## 2021-12-11 DIAGNOSIS — F4320 Adjustment disorder, unspecified: Secondary | ICD-10-CM | POA: Diagnosis not present

## 2021-12-11 DIAGNOSIS — E039 Hypothyroidism, unspecified: Secondary | ICD-10-CM | POA: Diagnosis not present

## 2021-12-11 DIAGNOSIS — E119 Type 2 diabetes mellitus without complications: Secondary | ICD-10-CM | POA: Insufficient documentation

## 2021-12-11 DIAGNOSIS — I10 Essential (primary) hypertension: Secondary | ICD-10-CM | POA: Insufficient documentation

## 2021-12-11 DIAGNOSIS — F430 Acute stress reaction: Secondary | ICD-10-CM | POA: Diagnosis present

## 2021-12-11 DIAGNOSIS — F4389 Other reactions to severe stress: Secondary | ICD-10-CM | POA: Insufficient documentation

## 2021-12-11 LAB — COMPREHENSIVE METABOLIC PANEL
ALT: 18 U/L (ref 0–44)
AST: 23 U/L (ref 15–41)
Albumin: 4.1 g/dL (ref 3.5–5.0)
Alkaline Phosphatase: 84 U/L (ref 38–126)
Anion gap: 10 (ref 5–15)
BUN: 14 mg/dL (ref 6–20)
CO2: 22 mmol/L (ref 22–32)
Calcium: 9.6 mg/dL (ref 8.9–10.3)
Chloride: 109 mmol/L (ref 98–111)
Creatinine, Ser: 1.07 mg/dL — ABNORMAL HIGH (ref 0.44–1.00)
GFR, Estimated: >60 mL/min (ref >60)
Glucose, Bld: 144 mg/dL — ABNORMAL HIGH (ref 70–99)
Potassium: 3.7 mmol/L (ref 3.5–5.1)
Sodium: 141 mmol/L (ref 135–145)
Total Bilirubin: 0.5 mg/dL (ref 0.3–1.2)
Total Protein: 8.1 g/dL (ref 6.5–8.1)

## 2021-12-11 LAB — CBC
HCT: 33.6 % — ABNORMAL LOW (ref 36.0–46.0)
Hemoglobin: 10.7 g/dL — ABNORMAL LOW (ref 12.0–15.0)
MCH: 28.5 pg (ref 26.0–34.0)
MCHC: 31.8 g/dL (ref 30.0–36.0)
MCV: 89.6 fL (ref 80.0–100.0)
Platelets: 305 10*3/uL (ref 150–400)
RBC: 3.75 MIL/uL — ABNORMAL LOW (ref 3.87–5.11)
RDW: 13.5 % (ref 11.5–15.5)
WBC: 10.7 10*3/uL — ABNORMAL HIGH (ref 4.0–10.5)
nRBC: 0 % (ref 0.0–0.2)

## 2021-12-11 LAB — SALICYLATE LEVEL: Salicylate Lvl: <7 mg/dL — ABNORMAL LOW (ref 7.0–30.0)

## 2021-12-11 LAB — ACETAMINOPHEN LEVEL: Acetaminophen (Tylenol), Serum: <10 ug/mL — ABNORMAL LOW (ref 10–30)

## 2021-12-11 MED ORDER — VITAMIN D 25 MCG (1000 UNIT) PO TABS
1000.0000 [IU] | ORAL_TABLET | Freq: Every day | ORAL | Status: DC
  Administered 2021-12-11 – 2021-12-12 (×2): 1000 [IU] via ORAL
  Filled 2021-12-11 (×2): qty 1

## 2021-12-11 MED ORDER — QUETIAPINE FUMARATE ER 200 MG PO TB24
400.0000 mg | ORAL_TABLET | Freq: Every day | ORAL | Status: DC
  Administered 2021-12-11: 400 mg via ORAL
  Filled 2021-12-11: qty 2

## 2021-12-11 MED ORDER — METFORMIN HCL 500 MG PO TABS
1000.0000 mg | ORAL_TABLET | Freq: Two times a day (BID) | ORAL | Status: DC
  Administered 2021-12-11 – 2021-12-12 (×2): 1000 mg via ORAL
  Filled 2021-12-11 (×2): qty 2

## 2021-12-11 MED ORDER — PROPRANOLOL HCL 20 MG PO TABS
40.0000 mg | ORAL_TABLET | Freq: Two times a day (BID) | ORAL | Status: DC
Start: 2021-12-11 — End: 2021-12-12
  Administered 2021-12-11 – 2021-12-12 (×2): 40 mg via ORAL
  Filled 2021-12-11 (×2): qty 2

## 2021-12-11 MED ORDER — GABAPENTIN 300 MG PO CAPS
800.0000 mg | ORAL_CAPSULE | Freq: Three times a day (TID) | ORAL | Status: DC
  Administered 2021-12-11 – 2021-12-12 (×2): 800 mg via ORAL
  Filled 2021-12-11 (×2): qty 2

## 2021-12-11 MED ORDER — LITHIUM CARBONATE 300 MG PO CAPS
900.0000 mg | ORAL_CAPSULE | Freq: Every day | ORAL | Status: DC
Start: 2021-12-11 — End: 2021-12-12
  Administered 2021-12-11: 900 mg via ORAL
  Filled 2021-12-11: qty 3

## 2021-12-11 MED ORDER — LEVOTHYROXINE SODIUM 50 MCG PO TABS
175.0000 ug | ORAL_TABLET | Freq: Every day | ORAL | Status: DC
  Administered 2021-12-12: 175 ug via ORAL
  Filled 2021-12-11: qty 4

## 2021-12-11 MED ORDER — LISINOPRIL 10 MG PO TABS
20.0000 mg | ORAL_TABLET | Freq: Every day | ORAL | Status: DC
Start: 2021-12-11 — End: 2021-12-12
  Administered 2021-12-11: 20 mg via ORAL
  Filled 2021-12-11: qty 2

## 2021-12-11 MED ORDER — CLONAZEPAM 0.5 MG PO TABS
1.0000 mg | ORAL_TABLET | Freq: Three times a day (TID) | ORAL | Status: DC
  Administered 2021-12-11 – 2021-12-12 (×3): 1 mg via ORAL
  Filled 2021-12-11 (×3): qty 2

## 2021-12-11 MED ORDER — ATORVASTATIN CALCIUM 20 MG PO TABS
10.0000 mg | ORAL_TABLET | Freq: Every day | ORAL | Status: DC
  Administered 2021-12-11: 10 mg via ORAL
  Filled 2021-12-11: qty 1

## 2021-12-11 MED ORDER — CARBAMAZEPINE 200 MG PO TABS
400.0000 mg | ORAL_TABLET | Freq: Two times a day (BID) | ORAL | Status: DC
  Administered 2021-12-11 – 2021-12-12 (×2): 400 mg via ORAL
  Filled 2021-12-11 (×2): qty 2

## 2021-12-11 MED ORDER — GABAPENTIN 800 MG PO TABS
800.0000 mg | ORAL_TABLET | Freq: Three times a day (TID) | ORAL | Status: DC
  Filled 2021-12-11: qty 1

## 2021-12-11 NOTE — ED Notes (Signed)
Patient asking if group home had been called.  Explained to patient that previous nurse had tried but that no one answered the phone.  Patient states that if group home is not called again she would act out.  Explained to patient that this RN would try to contact group home after she had made her rounds on all patients.

## 2021-12-11 NOTE — Consult Note (Signed)
[facility] Face-to-Face Psychiatry Consult   Reason for Consult:  suicidal ideations as she does not like her group home Referring Physician:  EDP Patient Identification: Dawn Lambert MRN:  [id number] Principal Diagnosis: Stress reaction causing mixed disturbance of emotion and conduct Diagnosis:  Principal Problem:   Stress reaction causing mixed disturbance of emotion and conduct   Total Time spent with patient: 45 minutes  Subjective:   Dawn Lambert is a 31 y.o. female patient admitted with suicidal ideations because she does not like her group home.  HPI:  31 yo female presented to the ED with suicidal ideations because she does not like the fact he group home does not let her do activities.  She stated if she goes back, she will run in the road to kill herself.  Continues to be upset and voice that she will bang her head in the ED to hurt herself.  She stood up and banged her head on the cushion of her chair.  Shadawn cannot identify anything specific that she does not like there.  She has a long history of coming to the ED when she gets upset or does not get what she wants and states suicidal ideations.  These thoughts started today.  Anxiety is high, no panic attacks.  No homicidal ideations, hallucinations, or substance abuse.    Her guardian, Trent Cleare, reports she often gets upset with weekend staff when Lorenza's every whim is not catered to or she does not get her way.  She will go to great lengths to get it.  She is agreeable for her to return when she calms.    Caveat:  Client with IDD and low threshold for frustrations.  She typically calms and can be redirected back to the group home.  Past Psychiatric History: anxiety, IDD  Risk to Self:  yes Risk to Others:  none Prior Inpatient Therapy:  yes Prior Outpatient Therapy:  yes  Past Medical History:  Past Medical History:  Diagnosis Date   Anxiety    Arnold-Chiari malformation ([place])    Atrial septal defect     Complication of anesthesia    when pt was 11, woke up during surgery (muscle biopsy)   Constipation    Contraception management    Dandruff    Dermatomyositis ([place])    age 59   Diabetes mellitus without complication ([place])    Elevated blood pressure    Hypertension    Hypothyroidism    Mental retardation    Mood disorder ([place])    Overweight(278.02)    Pelvic pain    Prediabetes     Past Surgical History:  Procedure Laterality Date   ASD REPAIR     at age 66   UMBILICAL HERNIA REPAIR N/A 09/17/2017   Procedure: HERNIA REPAIR UMBILICAL ADULT;  Surgeon: Robert Bellow, MD;  Location: [facility] ORS;  Service: General;  Laterality: N/A;   Family History: History reviewed. No pertinent family history. Family Psychiatric  History: none Social History:  Social History   Substance and Sexual Activity  Alcohol Use No     Social History   Substance and Sexual Activity  Drug Use No    Social History   Socioeconomic History   Marital status: Single    Spouse name: Not on file   Number of children: Not on file   Years of education: Not on file   Highest education level: Not on file  Occupational History   Not on file  Tobacco Use  Smoking status: Never   Smokeless tobacco: Never  Vaping Use   Vaping Use: Never used  Substance and Sexual Activity   Alcohol use: No   Drug use: No   Sexual activity: Not on file  Other Topics Concern   Not on file  Social History Narrative   Not on file   Social Determinants of Health   Financial Resource Strain: Not on file  Food Insecurity: Not on file  Transportation Needs: Not on file  Physical Activity: Not on file  Stress: Not on file  Social Connections: Not on file   Additional Social History:  lives in a group home    Allergies:   Allergies  Allergen Reactions   Augmentin [Amoxicillin-Pot Clavulanate] Other (See Comments)    Has patient had a PCN reaction causing immediate rash, facial/tongue/throat swelling, SOB or  lightheadedness with hypotension: ______ Has patient had a PCN reaction causing severe rash involving mucus membranes or skin necrosis: _______ Has patient had a PCN reaction that required hospitalization: _______ Has patient had a PCN reaction occurring within the last 10 years:_______ If all of the above answers are "NO", then may proceed with Cephalosporin use.     Labs:  Results for orders placed or performed during the hospital encounter of 12/11/21 (from the past 48 hour(s))  Comprehensive metabolic panel     Status: Abnormal   Collection Time: 12/11/21  3:37 PM  Result Value Ref Range   Sodium 141 135 - 145 mmol/L   Potassium 3.7 3.5 - 5.1 mmol/L   Chloride 109 98 - 111 mmol/L   CO2 22 22 - 32 mmol/L   Glucose, Bld 144 (H) 70 - 99 mg/dL    Comment: Glucose reference range applies only to samples taken after fasting for at least 8 hours.   BUN 14 6 - 20 mg/dL   Creatinine, Ser 3.15 (H) 0.44 - 1.00 mg/dL   Calcium 9.6 8.9 - 40.0 mg/dL   Total Protein 8.1 6.5 - 8.1 g/dL   Albumin 4.1 3.5 - 5.0 g/dL   AST 23 15 - 41 U/L   ALT 18 0 - 44 U/L   Alkaline Phosphatase 84 38 - 126 U/L   Total Bilirubin 0.5 0.3 - 1.2 mg/dL   GFR, Estimated >86 >76 mL/min    Comment: (NOTE) Calculated using the CKD-EPI Creatinine Equation (2021)    Anion gap 10 5 - 15    Comment: Performed at [facility], [address] [place], [name] [address]  Salicylate level     Status: Abnormal   Collection Time: 12/11/21  3:37 PM  Result Value Ref Range   Salicylate Lvl <7.0 (L) 7.0 - 30.0 mg/dL    Comment: Performed at [facility], [address] [place], [name] [address]  Acetaminophen level     Status: Abnormal   Collection Time: 12/11/21  3:37 PM  Result Value Ref Range   Acetaminophen (Tylenol), Serum <10 (L) 10 - 30 ug/mL    Comment: (NOTE) Therapeutic concentrations vary significantly. A range of 10-30 ug/mL  may be an effective concentration for many patients. However,  some  are best treated at concentrations outside of this range. Acetaminophen concentrations >150 ug/mL at 4 hours after ingestion  and >50 ug/mL at 12 hours after ingestion are often associated with  toxic reactions.  Performed at [facility], [address] [place], [name] [address]   cbc     Status: Abnormal   Collection Time: 12/11/21  3:37 PM  Result Value Ref Range   WBC 10.7 (H) 4.0 - 10.5 K/uL   RBC 3.75 (L) 3.87 - 5.11 MIL/uL   Hemoglobin 10.7 (L) 12.0 - 15.0 g/dL   HCT 12.4 (L) 58.0 - 99.8 %   MCV 89.6 80.0 - 100.0 fL   MCH 28.5 26.0 - 34.0 pg   MCHC 31.8 30.0 - 36.0 g/dL   RDW 33.8 25.0 - 53.9 %   Platelets 305 150 - 400 K/uL   nRBC 0.0 0.0 - 0.2 %    Comment: Performed at [facility], [address] [place], [name] [address]    Current Facility-Administered Medications  Medication Dose Route Frequency Provider Last Rate Last Admin   atorvastatin (LIPITOR) tablet 10 mg  10 mg Oral QHS Charm Rings, NP       carbamazepine (TEGRETOL) tablet 400 mg  400 mg Oral BID Charm Rings, NP       cholecalciferol (VITAMIN D3) 25 MCG (1000 UNIT) tablet 1,000 Units  1,000 Units Oral Daily Charm Rings, NP       clonazePAM (KLONOPIN) tablet 1 mg  1 mg Oral TID Charm Rings, NP       gabapentin (NEURONTIN) tablet 800 mg  800 mg Oral TID Charm Rings, NP       [START ON 12/12/2021] levothyroxine (SYNTHROID) tablet 175 mcg  175 mcg Oral QAC breakfast Charm Rings, NP       lisinopril (ZESTRIL) tablet 20 mg  20 mg Oral QHS Charm Rings, NP       lithium carbonate capsule 900 mg  900 mg Oral QHS Charm Rings, NP       metFORMIN (GLUCOPHAGE) tablet 1,000 mg  1,000 mg Oral BID Charm Rings, NP       propranolol (INDERAL) tablet 40 mg  40 mg Oral BID Charm Rings, NP       QUEtiapine (SEROQUEL XR) 24 hr tablet 400 mg  400 mg Oral QHS Charm Rings, NP       Current Outpatient Medications  Medication Sig Dispense Refill    acetaminophen (TYLENOL) 325 MG tablet Take 650 mg by mouth every 6 (six) hours as needed for mild pain or moderate pain.     albuterol (VENTOLIN HFA) 108 (90 Base) MCG/ACT inhaler Inhale 2 puffs into the lungs every 4 (four) hours as needed.     atorvastatin (LIPITOR) 10 MG tablet Take 10 mg by mouth at bedtime.     carbamazepine (TEGRETOL) 200 MG tablet Take 400 mg by mouth 2 (two) times daily.     cholecalciferol (VITAMIN D) 1000 units tablet Take 1,000 Units by mouth daily.      clonazePAM (KLONOPIN) 1 MG tablet Take 1 mg by mouth 3 (three) times daily.      gabapentin (NEURONTIN) 800 MG tablet Take 800 mg by mouth 3 (three) times daily.     levothyroxine (SYNTHROID, LEVOTHROID) 175 MCG tablet Take 175 mcg by mouth daily before breakfast.     lisinopril (PRINIVIL,ZESTRIL) 20 MG tablet Take 20 mg by mouth at bedtime.     lithium carbonate 300 MG capsule Take 900 mg by mouth at bedtime.     LORazepam ER (LOREEV XR) 2 MG CS24 Take 2 mg by mouth in the morning.     metFORMIN (GLUCOPHAGE) 500 MG tablet Take 1,000 mg by mouth 2 (two) times daily.      Multiple Vitamin (THEREMS PO) Take 1  tablet by mouth daily.     paliperidone Palmitate ER (INVEGA TRINZA) 819 MG/2.63ML injection      propranolol (INDERAL) 40 MG tablet Take 40 mg by mouth 2 (two) times daily.     QUEtiapine (SEROQUEL XR) 300 MG 24 hr tablet Take by mouth.     QUEtiapine (SEROQUEL XR) 400 MG 24 hr tablet Take 400 mg by mouth at bedtime.     QUEtiapine (SEROQUEL) 50 MG tablet Take 50 mg by mouth at bedtime.     TRULICITY [id number] SOPN Inject into the skin.      Musculoskeletal: Strength & Muscle Tone: within normal limits Gait & Station: normal Patient leans: N/A  Psychiatric Specialty Exam: Physical Exam Vitals and nursing note reviewed.  Constitutional:      Appearance: Normal appearance.  HENT:     Head: Normocephalic.     Nose: Nose normal.  Pulmonary:     Effort: Pulmonary effort is normal.  Musculoskeletal:         General: Normal range of motion.     Cervical back: Normal range of motion.  Neurological:     General: No focal deficit present.     Mental Status: She is oriented to person, place, and time.  Psychiatric:        Attention and Perception: Attention and perception normal.        Mood and Affect: Mood is anxious and depressed.        Speech: Speech normal.        Behavior: Behavior normal. Behavior is cooperative.        Thought Content: Thought content includes suicidal ideation. Thought content includes suicidal plan.        Cognition and Memory: Cognition is impaired.        Judgment: Judgment is impulsive.     Review of Systems  Psychiatric/Behavioral:  Positive for depression and suicidal ideas. The patient is nervous/anxious.   All other systems reviewed and are negative.   Blood pressure 118/80, pulse 95, temperature 98.8 F (37.1 C), resp. rate 20, height 5\' 3"  (1.6 m), weight 123.8 kg, SpO2 98 %.Body mass index is 48.36 kg/m.  General Appearance: Casual  Eye Contact:  Fair  Speech:  Normal Rate  Volume:  Normal  Mood:  Anxious and Depressed  Affect:  Congruent  Thought Process:  Coherent  Orientation:  Full (Time, Place, and Person)  Thought Content:  WDL and Logical  Suicidal Thoughts:  Yes.  with intent/plan  Homicidal Thoughts:  No  Memory:  Immediate;   Fair Recent;   Fair Remote;   Fair  Judgement:  Impaired  Insight:  Fair  Psychomotor Activity:  Normal  Concentration:  Concentration: Fair and Attention Span: Fair  Recall:  [employer] of Knowledge:  Fair  Language:  Good  Akathisia:  No  Handed:  Right  AIMS (if indicated):     Assets:  Housing Leisure Time Physical Health Resilience Social Support  ADL's:  Intact  Cognition:  Impaired,  Mild  Sleep:        Physical Exam: Physical Exam Vitals and nursing note reviewed.  Constitutional:      Appearance: Normal appearance.  HENT:     Head: Normocephalic.     Nose: Nose normal.   Pulmonary:     Effort: Pulmonary effort is normal.  Musculoskeletal:        General: Normal range of motion.     Cervical back: Normal range of motion.  Neurological:  General: No focal deficit present.     Mental Status: She is oriented to person, place, and time.  Psychiatric:        Attention and Perception: Attention and perception normal.        Mood and Affect: Mood is anxious and depressed.        Speech: Speech normal.        Behavior: Behavior normal. Behavior is cooperative.        Thought Content: Thought content includes suicidal ideation. Thought content includes suicidal plan.        Cognition and Memory: Cognition is impaired.        Judgment: Judgment is impulsive.    Review of Systems  Psychiatric/Behavioral:  Positive for depression and suicidal ideas. The patient is nervous/anxious.   All other systems reviewed and are negative.  Blood pressure 118/80, pulse 95, temperature 98.8 F (37.1 C), resp. rate 20, height 5\' 3"  (1.6 m), weight 123.8 kg, SpO2 98 %. Body mass index is 48.36 kg/m.  Treatment Plan Summary: Stress reaction with mixed disturbance of emotion and conduct: Restarted home medications Return to group home when she calms  Disposition: Patient does not meet criteria for psychiatric inpatient admission.  Waylan Boga, NP 12/11/2021 5:26 PM

## 2021-12-11 NOTE — ED Provider Notes (Signed)
[facility] Provider Note    Event Date/Time   First MD Initiated Contact with Patient 12/11/21 1547     (approximate)   History   Psychiatric Evaluation   HPI  Dawn Lambert is a 31 y.o. female with history of intellectual disability, recurrent ED visits for behavioral issues, here for running away from her group home.  Patient arrives after reportedly running from her group home.  When the cops came to pick her up she tried to "roll" into the street.  She states that she wanted to kill herself.  She states that if she was to be discharged to go back to her group home, she would want to kill herself.  She states that if she were to be placed somewhere different, she would no longer be suicidal.  Denies any drug use.  She states she has not been taking her medications.     Physical Exam   Triage Vital Signs: ED Triage Vitals  Enc Vitals Group     BP 12/11/21 1532 118/80     Pulse Rate 12/11/21 1532 95     Resp 12/11/21 1532 20     Temp 12/11/21 1532 98.8 F (37.1 C)     Temp src --      SpO2 12/11/21 1532 98 %     Weight 12/11/21 1526 273 lb (123.8 kg)     Height 12/11/21 1526 5\' 3"  (1.6 m)     Head Circumference --      Peak Flow --      Pain Score 12/11/21 1526 10     Pain Loc --      Pain Edu? --      Excl. in GC? --     Most recent vital signs: Vitals:   12/11/21 1532  BP: 118/80  Pulse: 95  Resp: 20  Temp: 98.8 F (37.1 C)  SpO2: 98%     General: Awake, no distress.  CV:  Good peripheral perfusion. Regular rate. Resp:  Normal effort.  Abd:  No distention.  Other:  No significant head trauma.   ED Results / Procedures / Treatments   Labs (all labs ordered are listed, but only abnormal results are displayed) Labs Reviewed  COMPREHENSIVE METABOLIC PANEL - Abnormal; Notable for the following components:      Result Value   Glucose, Bld 144 (*)    Creatinine, Ser 1.07 (*)    All other components within normal limits   SALICYLATE LEVEL - Abnormal; Notable for the following components:   Salicylate Lvl <7.0 (*)    All other components within normal limits  ACETAMINOPHEN LEVEL - Abnormal; Notable for the following components:   Acetaminophen (Tylenol), Serum <10 (*)    All other components within normal limits  CBC - Abnormal; Notable for the following components:   WBC 10.7 (*)    RBC 3.75 (*)    Hemoglobin 10.7 (*)    HCT 33.6 (*)    All other components within normal limits  RESP PANEL BY RT-PCR (FLU A&B, COVID) ARPGX2  ETHANOL  URINE DRUG SCREEN, QUALITATIVE ([facility] ONLY)  POC URINE PREG, ED  POC URINE PREG, ED     EKG    RADIOLOGY    I also independently reviewed and agree with radiologist interpretations.   PROCEDURES:  Critical Care performed: No  MEDICATIONS ORDERED IN ED: Medications  atorvastatin (LIPITOR) tablet 10 mg (has no administration in time range)  carbamazepine (TEGRETOL) tablet 400 mg (has  no administration in time range)  cholecalciferol (VITAMIN D3) 25 MCG (1000 UNIT) tablet 1,000 Units (1,000 Units Oral Given 12/11/21 1738)  clonazePAM (KLONOPIN) tablet 1 mg (1 mg Oral Given 12/11/21 1738)  levothyroxine (SYNTHROID) tablet 175 mcg (has no administration in time range)  lisinopril (ZESTRIL) tablet 20 mg (has no administration in time range)  lithium carbonate capsule 900 mg (has no administration in time range)  metFORMIN (GLUCOPHAGE) tablet 1,000 mg (1,000 mg Oral Given 12/11/21 1738)  propranolol (INDERAL) tablet 40 mg (has no administration in time range)  QUEtiapine (SEROQUEL XR) 24 hr tablet 400 mg (has no administration in time range)  gabapentin (NEURONTIN) capsule 800 mg (has no administration in time range)     IMPRESSION / MDM / ASSESSMENT AND PLAN / ED COURSE  I reviewed the triage vital signs and the nursing notes.                                Ddx:  Differential includes the following, with pertinent life- or limb-threatening emergencies  considered:  Behavioral issue, depression, bipolar d/o  Patient's presentation is most consistent with acute complicated illness / injury requiring diagnostic workup.  MDM:  31 yo F here with behavioral issue versus psychiatric decompensation. No focal deficits. She is not intoxicated. Labs reassuring. CMP unremarkable. Tox labs negative. CBC unremarkable. Will c/s TTS and psych. Medically stable for psych disposition.    MEDICATIONS GIVEN IN ED: Medications  atorvastatin (LIPITOR) tablet 10 mg (has no administration in time range)  carbamazepine (TEGRETOL) tablet 400 mg (has no administration in time range)  cholecalciferol (VITAMIN D3) 25 MCG (1000 UNIT) tablet 1,000 Units (1,000 Units Oral Given 12/11/21 1738)  clonazePAM (KLONOPIN) tablet 1 mg (1 mg Oral Given 12/11/21 1738)  levothyroxine (SYNTHROID) tablet 175 mcg (has no administration in time range)  lisinopril (ZESTRIL) tablet 20 mg (has no administration in time range)  lithium carbonate capsule 900 mg (has no administration in time range)  metFORMIN (GLUCOPHAGE) tablet 1,000 mg (1,000 mg Oral Given 12/11/21 1738)  propranolol (INDERAL) tablet 40 mg (has no administration in time range)  QUEtiapine (SEROQUEL XR) 24 hr tablet 400 mg (has no administration in time range)  gabapentin (NEURONTIN) capsule 800 mg (has no administration in time range)     Consults:  Psych TTS   EMR reviewed  Prior ED visits and BH visits     FINAL CLINICAL IMPRESSION(S) / ED DIAGNOSES   Final diagnoses:  Adjustment disorder, unspecified type     Rx / DC Orders   ED Discharge Orders     None        Note:  This document was prepared using Dragon voice recognition software and may include unintentional dictation errors.   Shaune Pollack, MD 12/11/21 [phone]

## 2021-12-11 NOTE — BH Assessment (Signed)
Comprehensive Clinical Assessment (CCA) Screening, Triage and Referral Note  12/11/2021 Dawn Lambert [id number]  Chief Complaint:  Chief Complaint  Patient presents with   Psychiatric Evaluation   Visit Diagnosis: Stress reaction causing mixed disturbance of emotion and conduct  Dawn Lambert is a 31 year old female who presents to the ER due to voicing SI after getting upset with the staff at her group home. Patient is well known to the ER for similar visits. When she gets upset with staff for not getting something or doing somethings she wants to do, she voices SI with the intentions of coming to the ER to get away from the facility.  Patient Reported Information How did you hear about [name]? Self  What Is the Reason for Your Visit/Call Today? Upset with Group Home and voice SI to leave and come to the ER.  How Long Has This Been Causing You Problems? > than 6 months  What Do You Feel Would Help You the Most Today? Treatment for Depression or other mood problem   Have You Recently Had Any Thoughts About Hurting Yourself? Yes  Are You Planning to Commit Suicide/Harm Yourself At This time? No   Have you Recently Had Thoughts About Hurting Someone Dawn Lambert? No  Are You Planning to Harm Someone at This Time? No  Explanation: No data recorded  Have You Used Any Alcohol or Drugs in the Past 24 Hours? No  How Long Ago Did You Use Drugs or Alcohol? No data recorded What Did You Use and How Much? No data recorded  Do You Currently Have a Therapist/Psychiatrist? Yes  Name of Therapist/Psychiatrist: Via Group Home   Have You Been Recently Discharged From Any Office Practice or Programs? No  Explanation of Discharge From Practice/Program: No data recorded   CCA Screening Triage Referral Assessment Type of Contact: Face-to-Face  Telemedicine Service Delivery:   Is this Initial or Reassessment? No data recorded Date Telepsych consult ordered in [facility]:  No data recorded Time  Telepsych consult ordered in [facility]:  No data recorded Location of Assessment: [facility] ED  Provider Location: [facility] ED   Collateral Involvement: None provided   Does Patient Have a Court Appointed Legal Guardian? No data recorded Name and Contact of Legal Guardian: No data recorded If Minor and Not Living with Parent(s), Who has Custody? n/a  Is CPS involved or ever been involved? Never  Is APS involved or ever been involved? Never   Patient Determined To Be At Risk for Harm To Self or Others Based on Review of Patient Reported Information or Presenting Complaint? No  Method: No data recorded Availability of Means: No data recorded Intent: No data recorded Notification Required: No data recorded Additional Information for Danger to Others Potential: No data recorded Additional Comments for Danger to Others Potential: No data recorded Are There Guns or Other Weapons in Your Home? No data recorded Types of Guns/Weapons: No data recorded Are These Weapons Safely Secured?                            No data recorded Who Could Verify You Are Able To Have These Secured: No data recorded Do You Have any Outstanding Charges, Pending Court Dates, Parole/Probation? No data recorded Contacted To Inform of Risk of Harm To Self or Others: No data recorded  Does Patient Present under Involuntary Commitment? No  IVC Papers Initial File Date: No data recorded  [place] of Residence: [address]  Patient Currently Receiving the Following Services: Medication Management   Determination of Need: Emergent (2 hours)   Options For Referral: ED Visit   Discharge Disposition:    Dawn Gilford MS, LCAS, [facility], [facility] Therapeutic Triage Specialist 12/11/2021 6:33 PM

## 2021-12-11 NOTE — ED Notes (Signed)
Pt consistently screaming "I hate nurses", and continuously banging head and being uncooperative. Pt reports she is going to do that until they find her a new group home tonight. This RN explained this is not an option and behavior is unacceptable.

## 2021-12-11 NOTE — ED Notes (Signed)
This RN attempted to contact group home due to patient requesting to go home. No answer- unable to leave voice message

## 2021-12-11 NOTE — ED Notes (Signed)
Pt continuously biting self and being disruptive in hall in attempt to make nurses and security peform tasks for her. Once again educated patient this is not acceptable behavior in the emergency room

## 2021-12-11 NOTE — ED Notes (Addendum)
Pt belongings include:  1 pair of black shoes 1 multi-colored skirt  1 cream colored shirt  1 blue bra 1 pair of yellow earrings 1 purple red bracelet  1 pink watch  1 gold colored necklace 1 gold colored ring  1 pair pink underwear

## 2021-12-11 NOTE — ED Triage Notes (Signed)
Pt via [employer] from [facility]. Pt states the staff wasn't giving her what she asked them to so she ran and states she laid on the sidewalk and then when police came she tried to run into the road. Pt has a hx of this when she gets upset with her group home staff member. Denies ETOH. Denies substances. Pt is A&Ox4 and NAD, calm and cooperative.

## 2021-12-12 DIAGNOSIS — F4320 Adjustment disorder, unspecified: Secondary | ICD-10-CM | POA: Diagnosis not present

## 2021-12-12 NOTE — ED Notes (Signed)
Pt refused D/C vitals. Pt ambulatory to the waiting room. Met in lobby by group home staff [place].

## 2021-12-12 NOTE — ED Notes (Signed)
Pt has continued to yell "I feel suicidal" over and over. Dr. Scotty Court aware.

## 2021-12-12 NOTE — ED Notes (Signed)
Returned 1 bag of belongings to patient.

## 2021-12-12 NOTE — ED Notes (Signed)
Pt yelling that she hates nurses and that she will kill herself if she goes back to group home. Talked to pt about being quiet and not upsetting other pts. Pt compliant at this time.

## 2021-12-12 NOTE — ED Notes (Signed)
Pt continues to yell "I'm suicidal". Pt moved to room 24 and door closed.

## 2021-12-12 NOTE — ED Provider Notes (Addendum)
Emergency Medicine Observation Re-evaluation Note  Dawn Lambert is a 31 y.o. female, seen on rounds today.  Pt initially presented to the ED for complaints of Psychiatric Evaluation  Currently, the patient is calm, no acute complaints.  Physical Exam  Blood pressure 118/80, pulse 95, temperature 98.8 F (37.1 C), resp. rate 20, height 5\' 3"  (1.6 m), weight 123.8 kg, SpO2 98 %. Physical Exam General: NAD Lungs: CTAB Psych: not agitated  ED Course / MDM  EKG:    I have reviewed the labs performed to date as well as medications administered while in observation.  Recent changes in the last 24 hours include no acute events overnight.  Pt frequently shouts "I'm suicidal," but this is known attention-seeking/malingering behavior from the patient due to episodic discontent with her group home staff.  She has been evaluated by psychiatry who does not find any safety concern.  Plan  Current plan is for discharge back to group home. Patient is not under full IVC at this time.  ----------------------------------------- 9:33 AM on 12/12/2021 ----------------------------------------- Nursing contacted group home staff to come pick the patient up.  They note that it is normal behavior for the patient to shout about being suicidal and not worrisome.   12/14/2021, MD 12/12/21 12/14/21    [id number], MD 12/12/21 [phone]

## 2021-12-12 NOTE — ED Notes (Signed)
VOL/pending dispo in the AM  

## 2021-12-12 NOTE — ED Notes (Signed)
Pt up to bathroom.

## 2021-12-17 ENCOUNTER — Emergency Department
Admission: EM | Admit: 2021-12-17 | Discharge: 2021-12-17 | Disposition: A | Discharge status: Home / Self Care | Payer: Medicare Other | Attending: Emergency Medicine | Admitting: Emergency Medicine

## 2021-12-17 ENCOUNTER — Emergency Department: Payer: Medicare Other

## 2021-12-17 ENCOUNTER — Encounter: Payer: Self-pay | Admitting: Emergency Medicine

## 2021-12-17 ENCOUNTER — Other Ambulatory Visit: Payer: Self-pay

## 2021-12-17 DIAGNOSIS — I10 Essential (primary) hypertension: Secondary | ICD-10-CM | POA: Insufficient documentation

## 2021-12-17 DIAGNOSIS — A084 Viral intestinal infection, unspecified: Secondary | ICD-10-CM | POA: Diagnosis not present

## 2021-12-17 DIAGNOSIS — Z20822 Contact with and (suspected) exposure to covid-19: Secondary | ICD-10-CM | POA: Diagnosis not present

## 2021-12-17 DIAGNOSIS — E119 Type 2 diabetes mellitus without complications: Secondary | ICD-10-CM | POA: Diagnosis not present

## 2021-12-17 DIAGNOSIS — R197 Diarrhea, unspecified: Secondary | ICD-10-CM | POA: Diagnosis present

## 2021-12-17 LAB — COMPREHENSIVE METABOLIC PANEL
ALT: 22 U/L (ref 0–44)
AST: 28 U/L (ref 15–41)
Albumin: 3.9 g/dL (ref 3.5–5.0)
Alkaline Phosphatase: 71 U/L (ref 38–126)
Anion gap: 6 (ref 5–15)
BUN: 13 mg/dL (ref 6–20)
CO2: 24 mmol/L (ref 22–32)
Calcium: 9.4 mg/dL (ref 8.9–10.3)
Chloride: 108 mmol/L (ref 98–111)
Creatinine, Ser: 1.03 mg/dL — ABNORMAL HIGH (ref 0.44–1.00)
GFR, Estimated: >60 mL/min (ref >60)
Glucose, Bld: 128 mg/dL — ABNORMAL HIGH (ref 70–99)
Potassium: 4 mmol/L (ref 3.5–5.1)
Sodium: 138 mmol/L (ref 135–145)
Total Bilirubin: 0.5 mg/dL (ref 0.3–1.2)
Total Protein: 8.1 g/dL (ref 6.5–8.1)

## 2021-12-17 LAB — URINALYSIS, ROUTINE W REFLEX MICROSCOPIC
Bacteria, UA: NONE SEEN
Bilirubin Urine: NEGATIVE
Glucose, UA: NEGATIVE mg/dL
Ketones, ur: NEGATIVE mg/dL
Nitrite: NEGATIVE
Protein, ur: NEGATIVE mg/dL
Specific Gravity, Urine: 1.008 (ref 1.005–1.030)
pH: 6 (ref 5.0–8.0)

## 2021-12-17 LAB — CBC
HCT: 32.3 % — ABNORMAL LOW (ref 36.0–46.0)
Hemoglobin: 10.4 g/dL — ABNORMAL LOW (ref 12.0–15.0)
MCH: 28.9 pg (ref 26.0–34.0)
MCHC: 32.2 g/dL (ref 30.0–36.0)
MCV: 89.7 fL (ref 80.0–100.0)
Platelets: 301 10*3/uL (ref 150–400)
RBC: 3.6 MIL/uL — ABNORMAL LOW (ref 3.87–5.11)
RDW: 12.9 % (ref 11.5–15.5)
WBC: 11 10*3/uL — ABNORMAL HIGH (ref 4.0–10.5)
nRBC: 0 % (ref 0.0–0.2)

## 2021-12-17 LAB — PREGNANCY, URINE: Preg Test, Ur: NEGATIVE

## 2021-12-17 LAB — LIPASE, BLOOD: Lipase: 41 U/L (ref 11–51)

## 2021-12-17 LAB — SARS CORONAVIRUS 2 BY RT PCR: SARS Coronavirus 2 by RT PCR: NEGATIVE

## 2021-12-17 MED ORDER — ONDANSETRON HCL 4 MG/2ML IJ SOLN
4.0000 mg | Freq: Once | INTRAMUSCULAR | Status: AC
  Administered 2021-12-17: 4 mg via INTRAVENOUS
  Filled 2021-12-17: qty 2

## 2021-12-17 MED ORDER — ONDANSETRON 4 MG PO TBDP: 4.0000 mg | ORAL_TABLET | Freq: Three times a day (TID) | ORAL | 0 refills | Status: DC | PRN

## 2021-12-17 MED ORDER — LACTATED RINGERS IV BOLUS
1000.0000 mL | Freq: Once | INTRAVENOUS | Status: AC
  Administered 2021-12-17: 1000 mL via INTRAVENOUS

## 2021-12-17 NOTE — ED Notes (Signed)
E signature pad not working. Pt and caregiver educated on discharge instructions and verbalized understanding.  

## 2021-12-17 NOTE — ED Notes (Signed)
Pt's mother, Kambree Krauss called and given complete update on patients condition and discharge. She acknowledged update and discharge instructions.

## 2021-12-17 NOTE — ED Provider Notes (Signed)
Upon being discharged patient reported that she was having plans of running out of the street.  She was given some time to calm down.  States that she was feeling frustrated because she was wanting to go home and now really wants to go back to her group home.  This is a recurrent complaint for her frequently listed from frustrations.  She denies any SI or HI.  She does appear clinically stable.  Her guardian feels comfortable taking her back.  Patient stable for discharge.   Willy Eddy, MD 12/17/21 [phone]

## 2021-12-17 NOTE — ED Triage Notes (Signed)
Per EMS pt coming from [facility] group home- was riding in car and got out and laid herself on ground. Initial call for SI thoughts. C/o fever and N/V/D along with no sense of taste or smell x 4 days. Patient denies any SI thoughts today just states she was upset at the time since no one would bring her to be evaluated. Patient reports taking tylenol at 7:30 am.

## 2021-12-17 NOTE — ED Provider Notes (Signed)
[facility] Provider Note    Event Date/Time   First MD Initiated Contact with Patient 12/17/21 1200     (approximate)   History   Chief Complaint Diarrhea and Nausea   HPI  Dawn Lambert is a 31 y.o. female with past medical history of hypertension, diabetes, intellectual disability, depression who presents to the ED for vomiting and diarrhea.  Patient reports that she has had about 4 days of persistent nausea, vomiting, and diarrhea.  She reports crampy pain diffusely in her abdomen, but denies significant pain currently.  She has not noticed any blood in her emesis or stool.  She reports subjective fevers and chills, has not taken her temperature at home.  She denies any dysuria, hematuria, or flank pain.  She does endorse a cough but denies any chest pain or shortness of breath.  She is not aware of any sick contacts.  She apparently had a test for COVID performed 3 days ago at her group home that was negative.     Physical Exam   Triage Vital Signs: ED Triage Vitals  Enc Vitals Group     BP 12/17/21 1202 121/63     Pulse Rate 12/17/21 1202 87     Resp 12/17/21 1202 19     Temp 12/17/21 1202 99.6 F (37.6 C)     Temp Source 12/17/21 1202 Oral     SpO2 12/17/21 1152 98 %     Weight 12/17/21 1201 273 lb (123.8 kg)     Height 12/17/21 1201 5\' 3"  (1.6 m)     Head Circumference --      Peak Flow --      Pain Score 12/17/21 1201 8     Pain Loc --      Pain Edu? --      Excl. in GC? --     Most recent vital signs: Vitals:   12/17/21 1300 12/17/21 1400  BP:  120/65  Pulse: 73 78  Resp: 18 17  Temp:    SpO2:  99%    Constitutional: Alert and oriented. Eyes: Conjunctivae are normal. Head: Atraumatic. Nose: No congestion/rhinnorhea. Mouth/Throat: Mucous membranes are moist.  Cardiovascular: Normal rate, regular rhythm. Grossly normal heart sounds.  2+ radial pulses bilaterally. Respiratory: Normal respiratory effort.  No retractions.  Lungs CTAB. Gastrointestinal: Soft and nontender. No distention. Musculoskeletal: No lower extremity tenderness nor edema.  Neurologic:  Normal speech and language. No gross focal neurologic deficits are appreciated.    ED Results / Procedures / Treatments   Labs (all labs ordered are listed, but only abnormal results are displayed) Labs Reviewed  COMPREHENSIVE METABOLIC PANEL - Abnormal; Notable for the following components:      Result Value   Glucose, Bld 128 (*)    Creatinine, Ser 1.03 (*)    All other components within normal limits  CBC - Abnormal; Notable for the following components:   WBC 11.0 (*)    RBC 3.60 (*)    Hemoglobin 10.4 (*)    HCT 32.3 (*)    All other components within normal limits  URINALYSIS, ROUTINE W REFLEX MICROSCOPIC - Abnormal; Notable for the following components:   Color, Urine YELLOW (*)    APPearance HAZY (*)    Hgb urine dipstick SMALL (*)    Leukocytes,Ua TRACE (*)    All other components within normal limits  SARS CORONAVIRUS 2 BY RT PCR  LIPASE, BLOOD  PREGNANCY, URINE     EKG  ED ECG REPORT I, Chesley Noon, the attending physician, personally viewed and interpreted this ECG.   Date: 12/17/2021  EKG Time: 11:59  Rate: 86  Rhythm: normal sinus rhythm  Axis: Normal  Intervals:none  ST&T Change: None  PROCEDURES:  Critical Care performed: No  Procedures   MEDICATIONS ORDERED IN ED: Medications  lactated ringers bolus 1,000 mL (0 mLs Intravenous Stopped 12/17/21 1420)  ondansetron (ZOFRAN) injection 4 mg (4 mg Intravenous Given 12/17/21 1256)     IMPRESSION / MDM / ASSESSMENT AND PLAN / ED COURSE  I reviewed the triage vital signs and the nursing notes.                              31 y.o. female with past medical history of hypertension, diabetes, intellectual disability, and depression who presents to the ED for 4 days of nausea, vomiting, diarrhea, cough, and subjective fevers and chills.  Patient's presentation  is most consistent with acute presentation with potential threat to life or bodily function.  Differential diagnosis includes, but is not limited to, gastroenteritis, appendicitis, diverticulitis, UTI, COVID-19, bronchitis, pneumonia.  Patient well-appearing and in no acute distress, vital signs are reassuring.  She has a benign abdominal exam and symptoms seem most consistent with a viral etiology.  We will send testing for COVID-19 and screen chest x-ray, she is not in any respiratory distress and maintaining oxygen saturations on room air.  No indication for CT imaging of her abdomen at this time, we will screen labs including CBC, CMP, and lipase.  Pregnancy testing and urinalysis are pending at this time.  Labs are reassuring with no significant anemia, leukocytosis, electrolyte abnormality, or AKI.  LFTs and lipase are unremarkable, urinalysis shows no signs of infection.  Patient reports feeling better following IV fluids and Zofran, now tolerating oral intake without difficulty.  She is appropriate for discharge home with PCP follow-up, will be prescribed Zofran.  She was counseled to return to the ED for new or worsening symptoms.  Patient and caregiver agree with plan.      FINAL CLINICAL IMPRESSION(S) / ED DIAGNOSES   Final diagnoses:  Viral gastroenteritis     Rx / DC Orders   ED Discharge Orders          Ordered    ondansetron (ZOFRAN-ODT) 4 MG disintegrating tablet  Every 8 hours PRN        12/17/21 1516             Note:  This document was prepared using Dragon voice recognition software and may include unintentional dictation errors.   Chesley Noon, MD 12/17/21 1539

## 2021-12-18 ENCOUNTER — Emergency Department
Admission: EM | Admit: 2021-12-18 | Discharge: 2021-12-18 | Disposition: A | Discharge status: Home / Self Care | Payer: Medicare Other | Attending: Emergency Medicine | Admitting: Emergency Medicine

## 2021-12-18 ENCOUNTER — Encounter: Payer: Self-pay | Admitting: Emergency Medicine

## 2021-12-18 ENCOUNTER — Other Ambulatory Visit: Payer: Self-pay

## 2021-12-18 ENCOUNTER — Emergency Department: Payer: Medicare Other

## 2021-12-18 DIAGNOSIS — N3000 Acute cystitis without hematuria: Secondary | ICD-10-CM | POA: Diagnosis not present

## 2021-12-18 DIAGNOSIS — R300 Dysuria: Secondary | ICD-10-CM | POA: Diagnosis present

## 2021-12-18 LAB — URINALYSIS, ROUTINE W REFLEX MICROSCOPIC
Bilirubin Urine: NEGATIVE
Glucose, UA: NEGATIVE mg/dL
Ketones, ur: NEGATIVE mg/dL
Nitrite: NEGATIVE
Protein, ur: NEGATIVE mg/dL
Specific Gravity, Urine: 1.014 (ref 1.005–1.030)
pH: 6 (ref 5.0–8.0)

## 2021-12-18 LAB — COMPREHENSIVE METABOLIC PANEL
ALT: 23 U/L (ref 0–44)
AST: 28 U/L (ref 15–41)
Albumin: 3.8 g/dL (ref 3.5–5.0)
Alkaline Phosphatase: 70 U/L (ref 38–126)
Anion gap: 8 (ref 5–15)
BUN: 12 mg/dL (ref 6–20)
CO2: 25 mmol/L (ref 22–32)
Calcium: 9.7 mg/dL (ref 8.9–10.3)
Chloride: 107 mmol/L (ref 98–111)
Creatinine, Ser: 1.07 mg/dL — ABNORMAL HIGH (ref 0.44–1.00)
GFR, Estimated: >60 mL/min (ref >60)
Glucose, Bld: 179 mg/dL — ABNORMAL HIGH (ref 70–99)
Potassium: 3.6 mmol/L (ref 3.5–5.1)
Sodium: 140 mmol/L (ref 135–145)
Total Bilirubin: 0.4 mg/dL (ref 0.3–1.2)
Total Protein: 7.7 g/dL (ref 6.5–8.1)

## 2021-12-18 LAB — CBC
HCT: 34 % — ABNORMAL LOW (ref 36.0–46.0)
Hemoglobin: 10.8 g/dL — ABNORMAL LOW (ref 12.0–15.0)
MCH: 28.6 pg (ref 26.0–34.0)
MCHC: 31.8 g/dL (ref 30.0–36.0)
MCV: 89.9 fL (ref 80.0–100.0)
Platelets: 290 10*3/uL (ref 150–400)
RBC: 3.78 MIL/uL — ABNORMAL LOW (ref 3.87–5.11)
RDW: 13.1 % (ref 11.5–15.5)
WBC: 10.8 10*3/uL — ABNORMAL HIGH (ref 4.0–10.5)
nRBC: 0 % (ref 0.0–0.2)

## 2021-12-18 LAB — LIPASE, BLOOD: Lipase: 41 U/L (ref 11–51)

## 2021-12-18 LAB — POC URINE PREG, ED: Preg Test, Ur: NEGATIVE

## 2021-12-18 MED ORDER — NITROFURANTOIN MONOHYD MACRO 100 MG PO CAPS: 100.0000 mg | ORAL_CAPSULE | Freq: Two times a day (BID) | ORAL | 0 refills | Status: AC

## 2021-12-18 NOTE — ED Triage Notes (Signed)
Pt arrives via [facility] from group home with reports of abdominal pain, nausea, and diarrhea. Pt seen for same yesterday.

## 2021-12-18 NOTE — ED Provider Notes (Signed)
[facility] Provider Note    Event Date/Time   First MD Initiated Contact with Patient 12/18/21 1619     (approximate)   History   Abdominal Pain   HPI  Dawn Lambert is a 31 y.o. female with a past medical history of intellectual disability, depression, currently lives in a group home presents today for evaluation of burning with urination and runny nose. She denies nausea, vomiting, diarrhea or abdominal pain.  She is also requesting an x-ray of her ankle because last months she tripped on a dress and injured her right ankle.  She has been able to ambulate without difficulty.  She denies fevers or chills.  No abdominal pain or flank pain.  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      Excl. in GC?     Most recent vital signs: Vitals:   12/18/21 1632 12/18/21 1852  BP: 123/75 (!) 141/97  Pulse: 75 78  Resp: 16 16  Temp: 98.5 F (36.9 C) 98.3 F (36.8 C)  SpO2: 96% 100%    Physical Exam Vitals and nursing note reviewed.  Constitutional:      General: Awake and alert. No acute distress.    Appearance: Normal appearance. The patient is overweight.  HENT:     Head: Normocephalic and atraumatic.     Mouth: Mucous membranes are moist.  Eyes:     General: PERRL. Normal EOMs        Right eye: No  discharge.        Left eye: No discharge.     Conjunctiva/sclera: Conjunctivae normal.  Cardiovascular:     Rate and Rhythm: Normal rate and regular rhythm.     Pulses: Normal pulses.     Heart sounds: Normal heart sounds Pulmonary:     Effort: Pulmonary effort is normal. No respiratory distress.     Breath sounds: Normal breath sounds.  Abdominal:     Abdomen is soft. There is no abdominal tenderness. No rebound or guarding. No distention.  No CVA tenderness Musculoskeletal:        General: No swelling. Normal range of motion.     Cervical back: Normal range of motion and neck supple.  Left ankle: No deformities, skin color changes, swelling, or wounds noted.  Tenderness without swelling over the anterior talofibular ligament, no lateral or medial malleolar tenderness or proximal fifth metacarpal tenderness. No proximal fibular tenderness. 2+ pedal pulses with brisk capillary refill. Intact distal sensation and strength with normal ROM. Able to  plantar flex and dorsiflex against resistance. Able to invert and evert against resistance. Negative  dorsiflexion external rotation test. Negative squeeze test. Negative Thompson test Skin:    General: Skin is warm and dry.     Capillary Refill: Capillary refill takes less than 2 seconds.     Findings: No rash.  Neurological:     Mental Status: The patient is awake and alert.      ED Results / Procedures / Treatments   Labs (all labs ordered are listed, but only abnormal results are displayed) Labs Reviewed  COMPREHENSIVE METABOLIC PANEL - Abnormal; Notable for the following components:      Result Value   Glucose, Bld 179 (*)    Creatinine, Ser 1.07 (*)    All other components within normal limits  CBC - Abnormal; Notable for the following components:   WBC 10.8 (*)    RBC 3.78 (*)    Hemoglobin 10.8 (*)    HCT 34.0 (*)    All other components within normal limits  URINALYSIS, ROUTINE W REFLEX MICROSCOPIC - Abnormal; Notable for the  following components:   Color, Urine YELLOW (*)    APPearance HAZY (*)    Hgb urine dipstick SMALL (*)    Leukocytes,Ua MODERATE (*)    Bacteria, UA RARE (*)    All other components within normal limits  RESP PANEL BY RT-PCR (FLU A&B, COVID) ARPGX2  LIPASE, BLOOD  POC URINE PREG, ED     EKG     RADIOLOGY I independently reviewed and interpreted imaging and agree with radiologists findings.     PROCEDURES:  Critical Care performed:   Procedures   MEDICATIONS ORDERED IN ED: Medications - No data to display   IMPRESSION / MDM / ASSESSMENT AND PLAN / ED COURSE  I reviewed the triage vital signs and the nursing notes.   Differential diagnosis includes, but is not limited to, UTI/pyelonephritis, gastroenteritis, ankle sprain, ankle fracture.  Patient is awake and alert, hemodynamically stable and afebrile.  She has no reproducible abdominal tenderness.  She does report urinary burning, and urinalysis was positive for urinary tract infection.  No CVA tenderness to suggest pyelonephritis, no fever.  Labs are overall reassuring.  White blood cell count has decreased from that obtained yesterday.  She is overall quite nontoxic in appearance.  She requested soda and crackers multiple times, which she was given by RN.  She reports having an inversion injury to her ankle 1 month ago, and x-ray was obtained.  There is no evidence of fracture on x-ray.  There is no tenderness to proximal fibula that would be concerning for occult fracture.  There is no knee pain or swelling and no ligamental laxity, do not suspect knee injury.  There is no proximal fifth metatarsal tenderness concerning for Jones fracture.  Negative squeeze test so do not suspect high ankle sprain.  Negative Thompson test, do not suspect Achilles tendon rupture. Patient is able to bear weight without pain.  I discussed with her legal guardian [name] who understands and agrees, and all questions were answered.  I was unable to  reach the group home which I discussed with Renee, and [name] was able to call them herself and arrange for transport home.  Patient was transported home by a employee of the care home.  All questions were answered and patient was discharged in stable condition.   Patient's presentation is most consistent with acute complicated illness / injury requiring diagnostic workup.   Clinical Course  as of 12/18/21 2020  Sun Dec 18, 2021  1729 Discussed with [name], patient's legal guardian who is her mother.  We discussed the results.  Mother is coordinating a ride home for the patient to be at the group home [JP]  [phone] Per [name], someone from the group home on the way to pick up the patient [JP]    Clinical Course User Index [JP] Malaiah Viramontes, Herb Grays, PA-C     FINAL CLINICAL IMPRESSION(S) / ED DIAGNOSES   Final diagnoses:  Acute cystitis without hematuria     Rx / DC Orders   ED Discharge Orders          Ordered    nitrofurantoin, macrocrystal-monohydrate, (MACROBID) 100 MG capsule  2 times daily        12/18/21 1723             Note:  This document was prepared using Dragon voice recognition software and may include unintentional dictation errors.   Keturah Shavers 12/18/21 2020    Arnaldo Natal, MD 12/20/21 1300

## 2021-12-18 NOTE — Discharge Instructions (Addendum)
Your results reveal urinary tract infection. Your lab work otherwise is normal.  Please take the antibiotics as prescribed.  Please return for any new, worsening, or change in symptoms or other concerns.  It was a pleasure caring for you today.

## 2021-12-18 NOTE — ED Notes (Signed)
Patient left with Caregiver Crystal from [facility].

## 2021-12-18 NOTE — ED Provider Triage Note (Signed)
  Emergency Medicine Provider Triage Evaluation Note  Dawn Lambert , a 31 y.o.female,  was evaluated in triage.  Pt complains of abdominal pain, nausea, diarrhea.  She was seen here yesterday and diagnosed with viral gastroenteritis.  She states that she is mostly here for her left ankle, which has been hurting her for the past few days after she rolled it.  She states that she wants a referral to an orthopedic surgeon.   Review of Systems  Positive: Left ankle pain, abdominal pain, nausea, vomiting Negative: Denies fever, chest pain, headache  Physical Exam   Vitals:   12/18/21 1332  BP: 132/84  Pulse: 85  Resp: 17  Temp: 99 F (37.2 C)  SpO2: 98%   Gen:   Awake, no distress   Resp:  Normal effort  MSK:   Moves extremities without difficulty  Other:  No gross deformities of the left ankle.  Medical Decision Making  Given the patient's initial medical screening exam, the following diagnostic evaluation has been ordered. The patient will be placed in the appropriate treatment space, once one is available, to complete the evaluation and treatment. I have discussed the plan of care with the patient and I have advised the patient that an ED physician or mid-level practitioner will reevaluate their condition after the test results have been received, as the results may give them additional insight into the type of treatment they may need.    Diagnostics: Left ankle x-ray, labs, UA  Treatments: none immediately   Varney Daily, [place] 12/18/21 1538

## 2021-12-18 NOTE — ED Notes (Signed)
Patient declined Covid swab. Eileen Stanford Poggi PA-c aware

## 2022-03-05 ENCOUNTER — Other Ambulatory Visit: Payer: Self-pay

## 2022-03-05 ENCOUNTER — Emergency Department
Admission: EM | Admit: 2022-03-05 | Discharge: 2022-03-06 | Disposition: A | Discharge status: Home / Self Care | Payer: Medicare Other | Attending: Emergency Medicine | Admitting: Emergency Medicine

## 2022-03-05 DIAGNOSIS — Z79899 Other long term (current) drug therapy: Secondary | ICD-10-CM | POA: Insufficient documentation

## 2022-03-05 DIAGNOSIS — E039 Hypothyroidism, unspecified: Secondary | ICD-10-CM | POA: Insufficient documentation

## 2022-03-05 DIAGNOSIS — F79 Unspecified intellectual disabilities: Secondary | ICD-10-CM | POA: Insufficient documentation

## 2022-03-05 DIAGNOSIS — F4389 Other reactions to severe stress: Secondary | ICD-10-CM | POA: Insufficient documentation

## 2022-03-05 DIAGNOSIS — F430 Acute stress reaction: Secondary | ICD-10-CM | POA: Diagnosis present

## 2022-03-05 DIAGNOSIS — F439 Reaction to severe stress, unspecified: Secondary | ICD-10-CM

## 2022-03-05 DIAGNOSIS — Z8659 Personal history of other mental and behavioral disorders: Secondary | ICD-10-CM | POA: Diagnosis not present

## 2022-03-05 DIAGNOSIS — I10 Essential (primary) hypertension: Secondary | ICD-10-CM | POA: Insufficient documentation

## 2022-03-05 LAB — URINE DRUG SCREEN, QUALITATIVE (ARMC ONLY)
Amphetamines, Ur Screen: NOT DETECTED
Barbiturates, Ur Screen: NOT DETECTED
Benzodiazepine, Ur Scrn: NOT DETECTED
Cannabinoid 50 Ng, Ur ~~LOC~~: NOT DETECTED
Cocaine Metabolite,Ur ~~LOC~~: NOT DETECTED
MDMA (Ecstasy)Ur Screen: NOT DETECTED
Methadone Scn, Ur: NOT DETECTED
Opiate, Ur Screen: NOT DETECTED
Phencyclidine (PCP) Ur S: NOT DETECTED
Tricyclic, Ur Screen: NOT DETECTED

## 2022-03-05 LAB — CBC
HCT: 28.7 % — ABNORMAL LOW (ref 36.0–46.0)
Hemoglobin: 9.6 g/dL — ABNORMAL LOW (ref 12.0–15.0)
MCH: 29.4 pg (ref 26.0–34.0)
MCHC: 33.4 g/dL (ref 30.0–36.0)
MCV: 87.8 fL (ref 80.0–100.0)
Platelets: 273 10*3/uL (ref 150–400)
RBC: 3.27 MIL/uL — ABNORMAL LOW (ref 3.87–5.11)
RDW: 13.3 % (ref 11.5–15.5)
WBC: 11.1 10*3/uL — ABNORMAL HIGH (ref 4.0–10.5)
nRBC: 0 % (ref 0.0–0.2)

## 2022-03-05 LAB — LITHIUM LEVEL: Lithium Lvl: 0.8 mmol/L (ref 0.60–1.20)

## 2022-03-05 LAB — COMPREHENSIVE METABOLIC PANEL
ALT: 17 U/L (ref 0–44)
AST: 21 U/L (ref 15–41)
Albumin: 3.8 g/dL (ref 3.5–5.0)
Alkaline Phosphatase: 75 U/L (ref 38–126)
Anion gap: 9 (ref 5–15)
BUN: 12 mg/dL (ref 6–20)
CO2: 24 mmol/L (ref 22–32)
Calcium: 9.2 mg/dL (ref 8.9–10.3)
Chloride: 104 mmol/L (ref 98–111)
Creatinine, Ser: 1.12 mg/dL — ABNORMAL HIGH (ref 0.44–1.00)
GFR, Estimated: >60 mL/min (ref >60)
Glucose, Bld: 150 mg/dL — ABNORMAL HIGH (ref 70–99)
Potassium: 3.7 mmol/L (ref 3.5–5.1)
Sodium: 137 mmol/L (ref 135–145)
Total Bilirubin: 0.6 mg/dL (ref 0.3–1.2)
Total Protein: 7.4 g/dL (ref 6.5–8.1)

## 2022-03-05 LAB — POC URINE PREG, ED: Preg Test, Ur: NEGATIVE

## 2022-03-05 LAB — URINALYSIS, ROUTINE W REFLEX MICROSCOPIC
Bilirubin Urine: NEGATIVE
Glucose, UA: NEGATIVE mg/dL
Hgb urine dipstick: NEGATIVE
Ketones, ur: NEGATIVE mg/dL
Leukocytes,Ua: NEGATIVE
Nitrite: NEGATIVE
Protein, ur: NEGATIVE mg/dL
Specific Gravity, Urine: 1.004 — ABNORMAL LOW (ref 1.005–1.030)
pH: 7 (ref 5.0–8.0)

## 2022-03-05 LAB — SALICYLATE LEVEL: Salicylate Lvl: <7 mg/dL — ABNORMAL LOW (ref 7.0–30.0)

## 2022-03-05 LAB — ETHANOL: Alcohol, Ethyl (B): <10 mg/dL (ref <10)

## 2022-03-05 LAB — ACETAMINOPHEN LEVEL: Acetaminophen (Tylenol), Serum: <10 ug/mL — ABNORMAL LOW (ref 10–30)

## 2022-03-05 MED ORDER — CLONAZEPAM 0.5 MG PO TABS
1.0000 mg | ORAL_TABLET | Freq: Once | ORAL | Status: AC
  Administered 2022-03-05: 1 mg via ORAL
  Filled 2022-03-05: qty 2

## 2022-03-05 NOTE — ED Notes (Signed)
Lab called to run UA

## 2022-03-05 NOTE — ED Notes (Signed)
UA Preg negative

## 2022-03-05 NOTE — ED Notes (Signed)
Dressed pt out with this tech and RN maureen  Pt belongings:  1 black and white dress 1 black bra 1 blue underwear 2 black and white flip flops 1 colorful bracelet  1 gold ring 2 earrings 1 black necklace 1 pink watch

## 2022-03-05 NOTE — ED Notes (Signed)
Pt given meal tray and water 

## 2022-03-05 NOTE — BH Assessment (Signed)
This Probation officer spoke pt's group home staff Crystal [phone] about picking pt up. Crystal reported that she would speak with oncoming staff as she is the only staff there with residents at this time. Crystal agreed to call back with an update.

## 2022-03-05 NOTE — ED Provider Notes (Signed)
[facility] Provider Note    Event Date/Time   First MD Initiated Contact with Patient 03/05/22 1549     (approximate)   History   Burning with urination.    HPI  Dawn Lambert is a 31 y.o. female history of intellectual disability  Discussed with the patient's mother who is also her guardian.  Mother reports she received notice from the group home that morning had become upset after she did not get an additional meal tray.  When she gets upset she will do activities such as going outside or threatening harm in order to get attention.  Today she evidently went outside and laid down by the right side.  She is otherwise been in good health and actually for the last couple months has been doing very well attending classes at the [facility], and has shown improvement with some of her more recent behaviors.  Guardian is very pleased with the current group home setting she is in at [facility]  Patient reports that she does not have any acute concerns other than she has been having a discomfort when she urinates.  She reports similar symptoms with urinary tract infection in the past.  Otherwise no concerns, she reports that the "group home staff made her lay down in the road"  Denies desire to harm herself or anyone else at this time.  Denies concerns other than burning with urination for several days     Physical Exam   Triage Vital Signs: ED Triage Vitals  Enc Vitals Group     BP 03/05/22 1552 (!) 142/66     Pulse Rate 03/05/22 1552 78     Resp 03/05/22 1552 18     Temp 03/05/22 1552 98.6 F (37 C)     Temp Source 03/05/22 1552 Oral     SpO2 03/05/22 1552 96 %     Weight 03/05/22 1551 289 lb (131.1 kg)     Height 03/05/22 1551 5\' 2"  (1.575 m)     Head Circumference --      Peak Flow --      Pain Score 03/05/22 1551 0     Pain Loc --      Pain Edu? --      Excl. in [place]? --     Most recent vital signs: Vitals:   03/05/22 1552  BP: (!) 142/66   Pulse: 78  Resp: 18  Temp: 98.6 F (37 C)  SpO2: 96%     General: Awake, no distress.  Pleasant.  Calm.  Cognition is slightly slow, but she shows no evidence of acute agitation at this time and is compliant with our staff request CV:  Good peripheral perfusion.  Normal tones Resp:  Normal effort.  Abd:  No distention.  Soft nontender nondistended.  Reassuring abdominal exam Other:  She is alert.  Oriented to self and being at the hospital.  Denies desire to harm herself or anyone else.  She reports that the group home staff's actions for what forced her to go lay down by the road, she does not want to hurt herself   ED Results / Procedures / Treatments   Labs (all labs ordered are listed, but only abnormal results are displayed) Labs Reviewed  COMPREHENSIVE METABOLIC PANEL - Abnormal; Notable for the following components:      Result Value   Glucose, Bld 150 (*)    Creatinine, Ser 1.12 (*)    All other components within  normal limits  SALICYLATE LEVEL - Abnormal; Notable for the following components:   Salicylate Lvl [id number] (*)    All other components within normal limits  ACETAMINOPHEN LEVEL - Abnormal; Notable for the following components:   Acetaminophen (Tylenol), Serum <10 (*)    All other components within normal limits  CBC - Abnormal; Notable for the following components:   WBC 11.1 (*)    RBC 3.27 (*)    Hemoglobin 9.6 (*)    HCT 28.7 (*)    All other components within normal limits  URINALYSIS, ROUTINE W REFLEX MICROSCOPIC - Abnormal; Notable for the following components:   Color, Urine STRAW (*)    APPearance CLEAR (*)    Specific Gravity, Urine 1.004 (*)    All other components within normal limits  URINE CULTURE  ETHANOL  URINE DRUG SCREEN, QUALITATIVE ([facility] ONLY)  LITHIUM LEVEL  POC URINE PREG, ED     EKG     RADIOLOGY     PROCEDURES:  Critical Care performed: No  Procedures   MEDICATIONS ORDERED IN ED: Medications  clonazePAM  (KLONOPIN) tablet 1 mg (1 mg Oral Given 03/05/22 1651)     IMPRESSION / MDM / ASSESSMENT AND PLAN / ED COURSE  I reviewed the triage vital signs and the nursing notes.                              Differential diagnosis includes, but is not limited to, behavioral agitation, intellectual disability, impulse control, based on the clinical history and discussion with the patient's guardian it seems that the patient became upset when she was not given additional meal tray and as [place] she went and laid down next to the road.  She does not seem to have genuine suicidal ideation and given her intellectual disability as well, I think this was more of an impulse control or behavioral issue.  Nonetheless the patient may benefit from psychiatry consult I discussed this with the guardian, and we will have psychiatry and TTS see her.  She does not have evidence of acute medical illness causing this her vital signs are normal or exam reassuring but based on complaint of dysuria will check urinalysis to exclude UTI  Discussed with her guardian, guardian reports it would be in her interest for the patient to be able to return to [facility] group home upon completion of ER visit  Urinalysis negative for obvious infection.  Sent for culture though as patient does report some elements of dysuria, though in further keeping and discussion with guardian it seems the patient's presentation today was unrelated to dysuria and was related to behavioral disturbance and laying next to her roadway.  Labs including metabolic panel reviewed reassuring.  Lithium level therapeutic.  Labs reviewed very mild leukocytosis which appears to be chronic along with an element of a chronic anemia with a hemoglobin 9.6 today.  Urine pregnancy test negative    Patient's presentation is most consistent with acute complicated illness / injury requiring diagnostic workup.   ----------------------------------------- 7:29 PM on  03/05/2022 -----------------------------------------  Patient medically cleared for psychiatric consultation at this time.  The patient has been placed in psychiatric observation due to the need to provide a safe environment for the patient while obtaining psychiatric consultation and evaluation, as well as ongoing medical and medication management to treat the patient's condition.  The patient has not been placed under full IVC at this time.  Patient does, however have a guardian.    FINAL CLINICAL IMPRESSION(S) / ED DIAGNOSES   Final diagnoses:  Intellectual disability  History of impulsive behavior     Rx / DC Orders   ED Discharge Orders     None        Note:  This document was prepared using Dragon voice recognition software and may include unintentional dictation errors.   Delman Kitten, MD 03/05/22 1930

## 2022-03-05 NOTE — ED Notes (Signed)
Patient is vol pending consult 

## 2022-03-05 NOTE — ED Notes (Signed)
Called pt guardian Jamesia Linnen [phone] to inform her the pt is being discharged back to group home. She informed me she was already aware of this arrangement per the psych team. Attempted to call the [facility] for pt pick-up, no answer and unable to leave a VM.

## 2022-03-05 NOTE — Consult Note (Addendum)
[facility] Face-to-Face Psychiatry Consult   Reason for Consult:  suicidal ideations as she does not like her group home Referring Physician:  EDP Patient Identification: Dawn Lambert MRN:  [id number] Principal Diagnosis: <principal problem not specified> Diagnosis:  Active Problems:   Stress reaction causing mixed disturbance of emotion and conduct   Total Time spent with patient: 45 minutes  Subjective:   Dawn Lambert is a 31 y.o. female patient admitted after walking away from her group home and laying in a ditch after getting upset.  "It's the silliest thing."    HPI:  31 yo female presented to the ED after walking away from her group manager, her favorite caregiver, Dawn Lambert.  She got upset at play practice today and walked away.  "I didn't go in the road, I laid down at the ditch."  She is calm and cooperative and did not want to go into it as she feels bad.  She is concerned she won't get to be in the Christmas play now.  She denies suicidal ideations and wants to return to the group home.  Denies suicidal/homicidal ideations, hallucinations, and substance use.  Her guardian, her mother, Dawn Lambert was contacted and agreeable for her to return to her group home.  This provider could not reach the group home, psych cleared.  Caveat:  Client with IDD and low threshold for frustrations.  She typically calms and can be redirected back to the group home.  Past Psychiatric History: anxiety, IDD  Risk to Self:  yes Risk to Others:  none Prior Inpatient Therapy:  yes Prior Outpatient Therapy:  yes  Past Medical History:  Past Medical History:  Diagnosis Date   Anxiety    Arnold-Chiari malformation (HCC)    Atrial septal defect    Complication of anesthesia    when pt was 11, woke up during surgery (muscle biopsy)   Constipation    Contraception management    Dandruff    Dermatomyositis (HCC)    age 16   Diabetes mellitus without complication (HCC)    Elevated blood pressure     Hypertension    Hypothyroidism    Mental retardation    Mood disorder (HCC)    Overweight(278.02)    Pelvic pain    Prediabetes     Past Surgical History:  Procedure Laterality Date   ASD REPAIR     at age 79   UMBILICAL HERNIA REPAIR N/A 09/17/2017   Procedure: HERNIA REPAIR UMBILICAL ADULT;  Surgeon: Earline Mayotte, MD;  Location: [facility] ORS;  Service: General;  Laterality: N/A;   Family History: History reviewed. No pertinent family history. Family Psychiatric  History: none Social History:  Social History   Substance and Sexual Activity  Alcohol Use No     Social History   Substance and Sexual Activity  Drug Use No    Social History   Socioeconomic History   Marital status: Single    Spouse name: Not on file   Number of children: Not on file   Years of education: Not on file   Highest education level: Not on file  Occupational History   Not on file  Tobacco Use   Smoking status: Never   Smokeless tobacco: Never  Vaping Use   Vaping Use: Never used  Substance and Sexual Activity   Alcohol use: No   Drug use: No   Sexual activity: Not on file  Other Topics Concern   Not on file  Social History Narrative  Not on file   Social Determinants of Health   Financial Resource Strain: Not on file  Food Insecurity: Not on file  Transportation Needs: Not on file  Physical Activity: Not on file  Stress: Not on file  Social Connections: Not on file   Additional Social History:  lives in a group home    Allergies:   Allergies  Allergen Reactions   Augmentin [Amoxicillin-Pot Clavulanate] Other (See Comments)    Has patient had a PCN reaction causing immediate rash, facial/tongue/throat swelling, SOB or lightheadedness with hypotension: ______ Has patient had a PCN reaction causing severe rash involving mucus membranes or skin necrosis: _______ Has patient had a PCN reaction that required hospitalization: _______ Has patient had a PCN reaction occurring  within the last 10 years:_______ If all of the above answers are "NO", then may proceed with Cephalosporin use.     Labs:  Results for orders placed or performed during the hospital encounter of 03/05/22 (from the past 48 hour(s))  Comprehensive metabolic panel     Status: Abnormal   Collection Time: 03/05/22  4:06 PM  Result Value Ref Range   Sodium 137 135 - 145 mmol/L   Potassium 3.7 3.5 - 5.1 mmol/L   Chloride 104 98 - 111 mmol/L   CO2 24 22 - 32 mmol/L   Glucose, Bld 150 (H) 70 - 99 mg/dL    Comment: Glucose reference range applies only to samples taken after fasting for at least 8 hours.   BUN 12 6 - 20 mg/dL   Creatinine, Ser 1.96 (H) 0.44 - 1.00 mg/dL   Calcium 9.2 8.9 - 22.2 mg/dL   Total Protein 7.4 6.5 - 8.1 g/dL   Albumin 3.8 3.5 - 5.0 g/dL   AST 21 15 - 41 U/L   ALT 17 0 - 44 U/L   Alkaline Phosphatase 75 38 - 126 U/L   Total Bilirubin 0.6 0.3 - 1.2 mg/dL   GFR, Estimated >97 >98 mL/min    Comment: (NOTE) Calculated using the CKD-EPI Creatinine Equation (2021)    Anion gap 9 5 - 15    Comment: Performed at [facility], [address], [place], [name] [address]  Ethanol     Status: None   Collection Time: 03/05/22  4:06 PM  Result Value Ref Range   Alcohol, Ethyl (B) <10 <10 mg/dL    Comment: (NOTE) Lowest detectable limit for serum alcohol is 10 mg/dL.  For medical purposes only. Performed at [facility], [address] [place], [name] [address]   Salicylate level     Status: Abnormal   Collection Time: 03/05/22  4:06 PM  Result Value Ref Range   Salicylate Lvl <7.0 (L) 7.0 - 30.0 mg/dL    Comment: Performed at [facility], [address] [place], [name] [address]  Acetaminophen level     Status: Abnormal   Collection Time: 03/05/22  4:06 PM  Result Value Ref Range   Acetaminophen (Tylenol), Serum <10 (L) 10 - 30 ug/mL    Comment: (NOTE) Therapeutic concentrations vary significantly. A range of 10-30 ug/mL  may be  an effective concentration for many patients. However, some  are best treated at concentrations outside of this range. Acetaminophen concentrations >150 ug/mL at 4 hours after ingestion  and >50 ug/mL at 12 hours after ingestion are often associated with  toxic reactions.  Performed at [facility], [address], [place], [name] [address]   cbc     Status:  Abnormal   Collection Time: 03/05/22  4:06 PM  Result Value Ref Range   WBC 11.1 (H) 4.0 - 10.5 K/uL   RBC 3.27 (L) 3.87 - 5.11 MIL/uL   Hemoglobin 9.6 (L) 12.0 - 15.0 g/dL   HCT 59.5 (L) 63.8 - 75.6 %   MCV 87.8 80.0 - 100.0 fL   MCH 29.4 26.0 - 34.0 pg   MCHC 33.4 30.0 - 36.0 g/dL   RDW 43.3 29.5 - 18.8 %   Platelets 273 150 - 400 K/uL   nRBC 0.0 0.0 - 0.2 %    Comment: Performed at [facility], [address] [place], [name] [address]  Urine Drug Screen, Qualitative     Status: None   Collection Time: 03/05/22  4:06 PM  Result Value Ref Range   Tricyclic, Ur Screen NONE DETECTED NONE DETECTED   Amphetamines, Ur Screen NONE DETECTED NONE DETECTED   MDMA (Ecstasy)Ur Screen NONE DETECTED NONE DETECTED   Cocaine Metabolite,Ur [place] NONE DETECTED NONE DETECTED   Opiate, Ur Screen NONE DETECTED NONE DETECTED   Phencyclidine (PCP) Ur S NONE DETECTED NONE DETECTED   Cannabinoid 50 Ng, Ur [place] NONE DETECTED NONE DETECTED   Barbiturates, Ur Screen NONE DETECTED NONE DETECTED   Benzodiazepine, Ur Scrn NONE DETECTED NONE DETECTED   Methadone Scn, Ur NONE DETECTED NONE DETECTED    Comment: (NOTE) Tricyclics + metabolites, urine    Cutoff 1000 ng/mL Amphetamines + metabolites, urine  Cutoff 1000 ng/mL MDMA (Ecstasy), urine              Cutoff 500 ng/mL Cocaine Metabolite, urine          Cutoff 300 ng/mL Opiate + metabolites, urine        Cutoff 300 ng/mL Phencyclidine (PCP), urine         Cutoff 25 ng/mL Cannabinoid, urine                 Cutoff 50 ng/mL Barbiturates + metabolites, urine  Cutoff 200  ng/mL Benzodiazepine, urine              Cutoff 200 ng/mL Methadone, urine                   Cutoff 300 ng/mL  The urine drug screen provides only a preliminary, unconfirmed analytical test result and should not be used for non-medical purposes. Clinical consideration and professional judgment should be applied to any positive drug screen result due to possible interfering substances. A more specific alternate chemical method must be used in order to obtain a confirmed analytical result. Gas chromatography / mass spectrometry (GC/MS) is the preferred confirm atory method. Performed at [facility], [address] [place], [name] [address]   Lithium level     Status: None   Collection Time: 03/05/22  4:06 PM  Result Value Ref Range   Lithium Lvl 0.80 0.60 - 1.20 mmol/L    Comment: Performed at [facility], [address] [place], [name] [address]  POC urine preg, ED     Status: None   Collection Time: 03/05/22  4:07 PM  Result Value Ref Range   Preg Test, Ur NEGATIVE NEGATIVE    Comment:        THE SENSITIVITY OF THIS METHODOLOGY IS >24 mIU/mL   Urinalysis, Routine w reflex microscopic Urine, Clean Catch     Status: Abnormal   Collection Time: 03/05/22  4:14 PM  Result Value Ref Range   Color, Urine STRAW (  A) YELLOW   APPearance CLEAR (A) CLEAR   Specific Gravity, Urine 1.004 (L) 1.005 - 1.030   pH 7.0 5.0 - 8.0   Glucose, UA NEGATIVE NEGATIVE mg/dL   Hgb urine dipstick NEGATIVE NEGATIVE   Bilirubin Urine NEGATIVE NEGATIVE   Ketones, ur NEGATIVE NEGATIVE mg/dL   Protein, ur NEGATIVE NEGATIVE mg/dL   Nitrite NEGATIVE NEGATIVE   Leukocytes,Ua NEGATIVE NEGATIVE    Comment: Performed at [facility], [address] [place], [place] [address]    No current facility-administered medications for this encounter.   Current Outpatient Medications  Medication Sig Dispense Refill   acetaminophen (TYLENOL) 325 MG tablet Take 650 mg by mouth every  6 (six) hours as needed for mild pain or moderate pain.     albuterol (VENTOLIN HFA) 108 (90 Base) MCG/ACT inhaler Inhale 2 puffs into the lungs every 4 (four) hours as needed.     atorvastatin (LIPITOR) 10 MG tablet Take 10 mg by mouth at bedtime. (Patient not taking: Reported on 12/12/2021)     carbamazepine (TEGRETOL) 200 MG tablet Take 400 mg by mouth 2 (two) times daily.     cholecalciferol (VITAMIN D) 1000 units tablet Take 1,000 Units by mouth daily.      clonazePAM (KLONOPIN) 1 MG tablet Take 1 mg by mouth 2 (two) times daily.     gabapentin (NEURONTIN) 800 MG tablet Take 800 mg by mouth 3 (three) times daily.     levothyroxine (SYNTHROID, LEVOTHROID) 175 MCG tablet Take 175 mcg by mouth daily before breakfast.     lisinopril (PRINIVIL,ZESTRIL) 20 MG tablet Take 20 mg by mouth at bedtime.     lithium carbonate 300 MG capsule Take 300-600 mg by mouth 2 (two) times daily with a meal. 300 mg qam and 600 mg qhs     LORazepam ER (LOREEV XR) 2 MG CS24 Take 2 mg by mouth in the morning.     medroxyPROGESTERone (DEPO-PROVERA) 150 MG/ML injection Inject into the muscle.     metFORMIN (GLUCOPHAGE) 500 MG tablet Take 1,000 mg by mouth 2 (two) times daily.      Multiple Vitamin (THEREMS PO) Take 1 tablet by mouth daily.     ondansetron (ZOFRAN-ODT) 4 MG disintegrating tablet Take 1 tablet (4 mg total) by mouth every 8 (eight) hours as needed for nausea or vomiting. 12 tablet 0   paliperidone Palmitate ER (INVEGA TRINZA) 819 MG/2.63ML injection      propranolol (INDERAL) 40 MG tablet Take 40 mg by mouth 2 (two) times daily.     QUEtiapine (SEROQUEL XR) 300 MG 24 hr tablet Take by mouth. (Patient not taking: Reported on 12/12/2021)     QUEtiapine (SEROQUEL XR) 400 MG 24 hr tablet Take 400 mg by mouth at bedtime.     QUEtiapine (SEROQUEL) 50 MG tablet Take 50 mg by mouth at bedtime.     TRULICITY 0.75 MG/0.5ML SOPN Inject into the skin.      Musculoskeletal: Strength & Muscle Tone: within normal  limits Gait & Station: normal Patient leans: N/A  Psychiatric Specialty Exam: Physical Exam Vitals and nursing note reviewed.  Constitutional:      Appearance: Normal appearance.  HENT:     Head: Normocephalic.     Nose: Nose normal.  Pulmonary:     Effort: Pulmonary effort is normal.  Musculoskeletal:        General: Normal range of motion.     Cervical back: Normal range of motion.  Neurological:  General: No focal deficit present.     Mental Status: She is oriented to person, place, and time.  Psychiatric:        Attention and Perception: Attention and perception normal.        Mood and Affect: Mood is anxious and depressed.        Speech: Speech normal.        Behavior: Behavior normal. Behavior is cooperative.        Thought Content: Thought content includes suicidal ideation. Thought content includes suicidal plan.        Cognition and Memory: Cognition is impaired.        Judgment: Judgment is impulsive.     Review of Systems  Psychiatric/Behavioral:  Positive for depression and suicidal ideas. The patient is nervous/anxious.   All other systems reviewed and are negative.   Blood pressure (!) 142/66, pulse 78, temperature 98.6 F (37 C), temperature source Oral, resp. rate 18, height  (1.575 m), weight 131.1 kg, SpO2 96 %.Body mass index is 52.86 kg/m.  General Appearance: Casual  Eye Contact:  Fair  Speech:  Normal Rate  Volume:  Normal  Mood:  Anxious and Depressed  Affect:  Congruent  Thought Process:  Coherent  Orientation:  Full (Time, Place, and Person)  Thought Content:  WDL and Logical  Suicidal Thoughts:  Yes.  with intent/plan  Homicidal Thoughts:  No  Memory:  Immediate;   Fair Recent;   Fair Remote;   Fair  Judgement:  Impaired  Insight:  Fair  Psychomotor Activity:  Normal  Concentration:  Concentration: Fair and Attention Span: Fair  Recall:  [employer] of Knowledge:  Fair  Language:  Good  Akathisia:  No  Handed:  Right  AIMS  (if indicated):     Assets:  Housing Leisure Time Physical Health Resilience Social Support  ADL's:  Intact  Cognition:  Impaired,  Mild  Sleep:        Physical Exam: Physical Exam Vitals and nursing note reviewed.  Constitutional:      Appearance: Normal appearance.  HENT:     Head: Normocephalic.     Nose: Nose normal.  Pulmonary:     Effort: Pulmonary effort is normal.  Musculoskeletal:        General: Normal range of motion.     Cervical back: Normal range of motion.  Neurological:     General: No focal deficit present.     Mental Status: She is oriented to person, place, and time.  Psychiatric:        Attention and Perception: Attention and perception normal.        Mood and Affect: Mood is anxious and depressed.        Speech: Speech normal.        Behavior: Behavior normal. Behavior is cooperative.        Thought Content: Thought content includes suicidal ideation. Thought content includes suicidal plan.        Cognition and Memory: Cognition is impaired.        Judgment: Judgment is impulsive.    Review of Systems  Psychiatric/Behavioral:  Positive for depression and suicidal ideas. The patient is nervous/anxious.   All other systems reviewed and are negative.  Blood pressure (!) 142/66, pulse 78, temperature 98.6 F (37 C), temperature source Oral, resp. rate 18, height  (1.575 m), weight 131.1 kg, SpO2 96 %. Body mass index is 52.86 kg/m.  Treatment Plan Summary: Stress reaction with mixed  disturbance of emotion and conduct: Return to group home   Disposition: Patient does not meet criteria for psychiatric inpatient admission.  Waylan Boga, NP 03/05/2022 5:41 PM

## 2022-03-05 NOTE — ED Notes (Signed)
Pt given nighttime snack. 

## 2022-03-05 NOTE — BH Assessment (Signed)
Crystal [phone]  group home staff called back to inform this writer that there would not be enough staff to transport pt tonight. Crystal reported that the pt would have to be picked up in the AM.

## 2022-03-05 NOTE — ED Triage Notes (Signed)
Pt alert, pt from group home, per EMS, pt walked away from group home and was found on the side of the road and states she wanted to kill herself. Pt denies HI/SI, pt states she was willing to go back to the group home with staff but they told her she was suicidal. Pt denies trying to hurt herself. Pt states she has burning with urination.

## 2022-03-06 DIAGNOSIS — F79 Unspecified intellectual disabilities: Secondary | ICD-10-CM | POA: Diagnosis not present

## 2022-03-06 LAB — URINE CULTURE: Culture: NO GROWTH

## 2022-03-06 MED ORDER — CARBAMAZEPINE 200 MG PO TABS
400.0000 mg | ORAL_TABLET | Freq: Two times a day (BID) | ORAL | Status: DC
  Administered 2022-03-06 (×2): 400 mg via ORAL
  Filled 2022-03-06 (×2): qty 2

## 2022-03-06 MED ORDER — ALBUTEROL SULFATE HFA 108 (90 BASE) MCG/ACT IN AERS: 2.0000 | INHALATION_SPRAY | RESPIRATORY_TRACT | Status: DC | PRN

## 2022-03-06 MED ORDER — LEVOTHYROXINE SODIUM 75 MCG PO TABS
175.0000 ug | ORAL_TABLET | Freq: Every day | ORAL | Status: DC
  Administered 2022-03-06: 175 ug via ORAL
  Filled 2022-03-06: qty 1

## 2022-03-06 MED ORDER — ACETAMINOPHEN 325 MG PO TABS: 650.0000 mg | ORAL_TABLET | Freq: Four times a day (QID) | ORAL | Status: DC | PRN

## 2022-03-06 MED ORDER — PROPRANOLOL HCL 40 MG PO TABS
40.0000 mg | ORAL_TABLET | Freq: Two times a day (BID) | ORAL | Status: DC
  Administered 2022-03-06: 40 mg via ORAL
  Filled 2022-03-06: qty 1

## 2022-03-06 MED ORDER — CLONAZEPAM 1 MG PO TABS
1.0000 mg | ORAL_TABLET | Freq: Two times a day (BID) | ORAL | Status: DC
  Administered 2022-03-06 (×2): 1 mg via ORAL
  Filled 2022-03-06 (×2): qty 1

## 2022-03-06 MED ORDER — ONDANSETRON 4 MG PO TBDP: 4.0000 mg | ORAL_TABLET | Freq: Three times a day (TID) | ORAL | Status: DC | PRN

## 2022-03-06 MED ORDER — LITHIUM CARBONATE 300 MG PO CAPS: 600.0000 mg | ORAL_CAPSULE | Freq: Every day | ORAL | Status: DC

## 2022-03-06 MED ORDER — QUETIAPINE FUMARATE 25 MG PO TABS
50.0000 mg | ORAL_TABLET | Freq: Every day | ORAL | Status: DC
  Administered 2022-03-06: 50 mg via ORAL
  Filled 2022-03-06: qty 2

## 2022-03-06 MED ORDER — LITHIUM CARBONATE 300 MG PO CAPS: 300.0000 mg | ORAL_CAPSULE | Freq: Two times a day (BID) | ORAL | Status: DC

## 2022-03-06 MED ORDER — METFORMIN HCL 500 MG PO TABS
1000.0000 mg | ORAL_TABLET | Freq: Two times a day (BID) | ORAL | Status: DC
  Administered 2022-03-06: 1000 mg via ORAL
  Filled 2022-03-06: qty 2

## 2022-03-06 MED ORDER — GABAPENTIN 400 MG PO CAPS
800.0000 mg | ORAL_CAPSULE | Freq: Three times a day (TID) | ORAL | Status: DC
  Filled 2022-03-06: qty 2

## 2022-03-06 MED ORDER — LITHIUM CARBONATE 300 MG PO CAPS
300.0000 mg | ORAL_CAPSULE | Freq: Every day | ORAL | Status: DC
  Administered 2022-03-06: 300 mg via ORAL
  Filled 2022-03-06: qty 1

## 2022-03-06 MED ORDER — LISINOPRIL 20 MG PO TABS
20.0000 mg | ORAL_TABLET | Freq: Every day | ORAL | Status: DC
  Administered 2022-03-06: 20 mg via ORAL
  Filled 2022-03-06: qty 1

## 2022-03-06 NOTE — ED Notes (Signed)
Group home staff contacted, Bas[facility] and states that they will be here to pick up patient around 9AM today. Legal guardian and mother, Menucha Dicesare aware and approves of plan for disposition.

## 2022-03-06 NOTE — ED Notes (Signed)
Patient refused shower but patient washing face and brushing her teeth at this time

## 2022-03-06 NOTE — ED Notes (Signed)
Patient ate 100% of her breakfast.

## 2022-03-06 NOTE — ED Notes (Signed)
Discharged to group home staff [facility]. Pt left with all of belongings.

## 2022-03-12 ENCOUNTER — Emergency Department
Admission: EM | Admit: 2022-03-12 | Discharge: 2022-03-13 | Disposition: A | Discharge status: Home / Self Care | Payer: Medicare Other | Attending: Emergency Medicine | Admitting: Emergency Medicine

## 2022-03-12 ENCOUNTER — Emergency Department: Payer: Medicare Other

## 2022-03-12 ENCOUNTER — Other Ambulatory Visit: Payer: Self-pay

## 2022-03-12 DIAGNOSIS — R911 Solitary pulmonary nodule: Secondary | ICD-10-CM | POA: Diagnosis not present

## 2022-03-12 DIAGNOSIS — Y92480 Sidewalk as the place of occurrence of the external cause: Secondary | ICD-10-CM | POA: Insufficient documentation

## 2022-03-12 DIAGNOSIS — K529 Noninfective gastroenteritis and colitis, unspecified: Secondary | ICD-10-CM | POA: Insufficient documentation

## 2022-03-12 DIAGNOSIS — Y9302 Activity, running: Secondary | ICD-10-CM | POA: Insufficient documentation

## 2022-03-12 DIAGNOSIS — Z79899 Other long term (current) drug therapy: Secondary | ICD-10-CM | POA: Diagnosis not present

## 2022-03-12 DIAGNOSIS — S0990XA Unspecified injury of head, initial encounter: Secondary | ICD-10-CM | POA: Diagnosis present

## 2022-03-12 DIAGNOSIS — W01198A Fall on same level from slipping, tripping and stumbling with subsequent striking against other object, initial encounter: Secondary | ICD-10-CM | POA: Diagnosis not present

## 2022-03-12 DIAGNOSIS — F430 Acute stress reaction: Secondary | ICD-10-CM | POA: Diagnosis present

## 2022-03-12 DIAGNOSIS — F439 Reaction to severe stress, unspecified: Secondary | ICD-10-CM | POA: Diagnosis not present

## 2022-03-12 DIAGNOSIS — F4325 Adjustment disorder with mixed disturbance of emotions and conduct: Secondary | ICD-10-CM | POA: Diagnosis not present

## 2022-03-12 DIAGNOSIS — N9489 Other specified conditions associated with female genital organs and menstrual cycle: Secondary | ICD-10-CM | POA: Diagnosis not present

## 2022-03-12 DIAGNOSIS — Z20822 Contact with and (suspected) exposure to covid-19: Secondary | ICD-10-CM | POA: Insufficient documentation

## 2022-03-12 LAB — URINALYSIS, ROUTINE W REFLEX MICROSCOPIC
Bilirubin Urine: NEGATIVE
Glucose, UA: NEGATIVE mg/dL
Ketones, ur: NEGATIVE mg/dL
Nitrite: NEGATIVE
Protein, ur: NEGATIVE mg/dL
Specific Gravity, Urine: 1.003 — ABNORMAL LOW (ref 1.005–1.030)
pH: 6 (ref 5.0–8.0)

## 2022-03-12 LAB — ACETAMINOPHEN LEVEL: Acetaminophen (Tylenol), Serum: <10 ug/mL — ABNORMAL LOW (ref 10–30)

## 2022-03-12 LAB — CBC
HCT: 30.4 % — ABNORMAL LOW (ref 36.0–46.0)
Hemoglobin: 10 g/dL — ABNORMAL LOW (ref 12.0–15.0)
MCH: 29.2 pg (ref 26.0–34.0)
MCHC: 32.9 g/dL (ref 30.0–36.0)
MCV: 88.6 fL (ref 80.0–100.0)
Platelets: 280 10*3/uL (ref 150–400)
RBC: 3.43 MIL/uL — ABNORMAL LOW (ref 3.87–5.11)
RDW: 13.6 % (ref 11.5–15.5)
WBC: 10.8 10*3/uL — ABNORMAL HIGH (ref 4.0–10.5)
nRBC: 0 % (ref 0.0–0.2)

## 2022-03-12 LAB — COMPREHENSIVE METABOLIC PANEL
ALT: 40 U/L (ref 0–44)
AST: 39 U/L (ref 15–41)
Albumin: 3.8 g/dL (ref 3.5–5.0)
Alkaline Phosphatase: 77 U/L (ref 38–126)
Anion gap: 6 (ref 5–15)
BUN: 12 mg/dL (ref 6–20)
CO2: 25 mmol/L (ref 22–32)
Calcium: 9 mg/dL (ref 8.9–10.3)
Chloride: 104 mmol/L (ref 98–111)
Creatinine, Ser: 0.95 mg/dL (ref 0.44–1.00)
GFR, Estimated: >60 mL/min (ref >60)
Glucose, Bld: 109 mg/dL — ABNORMAL HIGH (ref 70–99)
Potassium: 3.7 mmol/L (ref 3.5–5.1)
Sodium: 135 mmol/L (ref 135–145)
Total Bilirubin: 0.5 mg/dL (ref 0.3–1.2)
Total Protein: 7.6 g/dL (ref 6.5–8.1)

## 2022-03-12 LAB — ETHANOL: Alcohol, Ethyl (B): <10 mg/dL (ref <10)

## 2022-03-12 LAB — URINE DRUG SCREEN, QUALITATIVE (ARMC ONLY)
Amphetamines, Ur Screen: NOT DETECTED
Barbiturates, Ur Screen: NOT DETECTED
Benzodiazepine, Ur Scrn: NOT DETECTED
Cannabinoid 50 Ng, Ur ~~LOC~~: NOT DETECTED
Cocaine Metabolite,Ur ~~LOC~~: NOT DETECTED
MDMA (Ecstasy)Ur Screen: NOT DETECTED
Methadone Scn, Ur: NOT DETECTED
Opiate, Ur Screen: NOT DETECTED
Phencyclidine (PCP) Ur S: NOT DETECTED
Tricyclic, Ur Screen: NOT DETECTED

## 2022-03-12 LAB — RESP PANEL BY RT-PCR (FLU A&B, COVID) ARPGX2
Influenza A by PCR: NEGATIVE
Influenza B by PCR: NEGATIVE
SARS Coronavirus 2 by RT PCR: NEGATIVE

## 2022-03-12 LAB — HCG, QUANTITATIVE, PREGNANCY: hCG, Beta Chain, Quant, S: <1 m[IU]/mL (ref <5)

## 2022-03-12 LAB — SALICYLATE LEVEL: Salicylate Lvl: <7 mg/dL — ABNORMAL LOW (ref 7.0–30.0)

## 2022-03-12 MED ORDER — LISINOPRIL 20 MG PO TABS
20.0000 mg | ORAL_TABLET | Freq: Every day | ORAL | Status: DC
  Administered 2022-03-12: 20 mg via ORAL
  Filled 2022-03-12: qty 2

## 2022-03-12 MED ORDER — CARBAMAZEPINE 200 MG PO TABS
400.0000 mg | ORAL_TABLET | Freq: Two times a day (BID) | ORAL | Status: DC
  Administered 2022-03-12 – 2022-03-13 (×2): 400 mg via ORAL
  Filled 2022-03-12 (×2): qty 2

## 2022-03-12 MED ORDER — ACETAMINOPHEN 500 MG PO TABS
1000.0000 mg | ORAL_TABLET | Freq: Once | ORAL | Status: AC
  Administered 2022-03-12: 1000 mg via ORAL
  Filled 2022-03-12: qty 2

## 2022-03-12 MED ORDER — PROPRANOLOL HCL 40 MG PO TABS
40.0000 mg | ORAL_TABLET | Freq: Two times a day (BID) | ORAL | Status: DC
  Administered 2022-03-12 – 2022-03-13 (×2): 40 mg via ORAL
  Filled 2022-03-12: qty 1
  Filled 2022-03-12: qty 2

## 2022-03-12 MED ORDER — LITHIUM CARBONATE 300 MG PO CAPS
600.0000 mg | ORAL_CAPSULE | Freq: Every day | ORAL | Status: DC
  Administered 2022-03-12: 600 mg via ORAL
  Filled 2022-03-12: qty 2

## 2022-03-12 MED ORDER — GABAPENTIN 400 MG PO CAPS
800.0000 mg | ORAL_CAPSULE | Freq: Three times a day (TID) | ORAL | Status: DC
  Administered 2022-03-12 – 2022-03-13 (×2): 800 mg via ORAL
  Filled 2022-03-12 (×2): qty 2

## 2022-03-12 MED ORDER — QUETIAPINE FUMARATE ER 200 MG PO TB24
400.0000 mg | ORAL_TABLET | Freq: Every day | ORAL | Status: DC
  Administered 2022-03-12: 400 mg via ORAL
  Filled 2022-03-12: qty 2

## 2022-03-12 MED ORDER — LITHIUM CARBONATE 300 MG PO CAPS
300.0000 mg | ORAL_CAPSULE | Freq: Every day | ORAL | Status: DC
  Administered 2022-03-13: 300 mg via ORAL
  Filled 2022-03-12: qty 1

## 2022-03-12 MED ORDER — ACETAMINOPHEN 325 MG PO TABS: 650.0000 mg | ORAL_TABLET | Freq: Four times a day (QID) | ORAL | Status: DC | PRN

## 2022-03-12 MED ORDER — CLONAZEPAM 1 MG PO TABS
1.0000 mg | ORAL_TABLET | Freq: Two times a day (BID) | ORAL | Status: DC
  Administered 2022-03-12 – 2022-03-13 (×2): 1 mg via ORAL
  Filled 2022-03-12: qty 1
  Filled 2022-03-12: qty 2

## 2022-03-12 MED ORDER — QUETIAPINE FUMARATE 25 MG PO TABS
50.0000 mg | ORAL_TABLET | Freq: Every day | ORAL | Status: DC
  Administered 2022-03-12: 50 mg via ORAL
  Filled 2022-03-12: qty 2

## 2022-03-12 MED ORDER — ATORVASTATIN CALCIUM 20 MG PO TABS
10.0000 mg | ORAL_TABLET | Freq: Every day | ORAL | Status: DC
  Administered 2022-03-12: 10 mg via ORAL
  Filled 2022-03-12: qty 1

## 2022-03-12 MED ORDER — LITHIUM CARBONATE ER 300 MG PO TBCR
600.0000 mg | EXTENDED_RELEASE_TABLET | Freq: Every day | ORAL | Status: DC
  Filled 2022-03-12: qty 2

## 2022-03-12 MED ORDER — ONDANSETRON 4 MG PO TBDP: 4.0000 mg | ORAL_TABLET | Freq: Three times a day (TID) | ORAL | 0 refills | Status: AC | PRN

## 2022-03-12 NOTE — Consult Note (Signed)
[facility] Face-to-Face Psychiatry Consult   Reason for Consult:  suicidal ideations after getting upset at her group home Referring Physician:  EDP Patient Identification: Dawn Lambert MRN:  [id number] Principal Diagnosis: Stress reaction with mixed disturbance of emotion and conduct: Diagnosis:  Stress reaction with mixed disturbance of emotion and conduct:  Total Time spent with patient: 45 minutes  Subjective:   Dawn Lambert is a 31 y.o. female patient admitted after walking away from her group home and laying on the sidewalk when she got upset.  "They don't understand I needed to come here because I was sick."  HPI:  31 yo female presented to the ED after walking away from her group home and lying on the sidewalk which resulted in her losing her privileges for 30 days.  She claims she needed to come to the hospital because she "was sick".  Dawn Lambert described having diarrhea occurring when she drinks 40 oz water suddenly.  Her guardian was contacted and discussed how her behaviors caused her restrictions as they made a contract with her to curb her behaviors of running away from her group home and lying in the road or near the road.  Denies suicidal/homicidal ideations, hallucinations, and substance use.  Her guardian, her mother, Dawn Lambert was agreeable for her to return to her group home, psych cleared.  Caveat:  Client with IDD and low threshold for frustrations.  She typically calms and can be redirected back to the group home.  Past Psychiatric History: anxiety, IDD  Risk to Self:  none Risk to Others:  none Prior Inpatient Therapy:  yes Prior Outpatient Therapy:  yes  Past Medical History:  Past Medical History:  Diagnosis Date   Anxiety    Arnold-Chiari malformation (HCC)    Atrial septal defect    Complication of anesthesia    when pt was 11, woke up during surgery (muscle biopsy)   Constipation    Contraception management    Dandruff    Dermatomyositis (HCC)    age 26    Diabetes mellitus without complication (HCC)    Elevated blood pressure    Hypertension    Hypothyroidism    Mental retardation    Mood disorder (HCC)    Overweight(278.02)    Pelvic pain    Prediabetes     Past Surgical History:  Procedure Laterality Date   ASD REPAIR     at age 26   UMBILICAL HERNIA REPAIR N/A 09/17/2017   Procedure: HERNIA REPAIR UMBILICAL ADULT;  Surgeon: Earline Mayotte, MD;  Location: [facility] ORS;  Service: General;  Laterality: N/A;   Family History: No family history on file. Family Psychiatric  History: none Social History:  Social History   Substance and Sexual Activity  Alcohol Use No     Social History   Substance and Sexual Activity  Drug Use No    Social History   Socioeconomic History   Marital status: Single    Spouse name: Not on file   Number of children: Not on file   Years of education: Not on file   Highest education level: Not on file  Occupational History   Not on file  Tobacco Use   Smoking status: Never   Smokeless tobacco: Never  Vaping Use   Vaping Use: Never used  Substance and Sexual Activity   Alcohol use: No   Drug use: No   Sexual activity: Not on file  Other Topics Concern   Not on file  Social History  Narrative   Not on file   Social Determinants of Health   Financial Resource Strain: Not on file  Food Insecurity: Not on file  Transportation Needs: Not on file  Physical Activity: Not on file  Stress: Not on file  Social Connections: Not on file   Additional Social History:  lives in a group home    Allergies:   Allergies  Allergen Reactions   Augmentin [Amoxicillin-Pot Clavulanate] Other (See Comments)    Has patient had a PCN reaction causing immediate rash, facial/tongue/throat swelling, SOB or lightheadedness with hypotension: ______ Has patient had a PCN reaction causing severe rash involving mucus membranes or skin necrosis: _______ Has patient had a PCN reaction that required  hospitalization: _______ Has patient had a PCN reaction occurring within the last 10 years:_______ If all of the above answers are "NO", then may proceed with Cephalosporin use.     Labs:  Results for orders placed or performed during the hospital encounter of 03/12/22 (from the past 48 hour(s))  Comprehensive metabolic panel     Status: Abnormal   Collection Time: 03/12/22 10:53 AM  Result Value Ref Range   Sodium 135 135 - 145 mmol/L   Potassium 3.7 3.5 - 5.1 mmol/L   Chloride 104 98 - 111 mmol/L   CO2 25 22 - 32 mmol/L   Glucose, Bld 109 (H) 70 - 99 mg/dL    Comment: Glucose reference range applies only to samples taken after fasting for at least 8 hours.   BUN 12 6 - 20 mg/dL   Creatinine, Ser 2.950.95 0.44 - 1.00 mg/dL   Calcium 9.0 8.9 - 62.110.3 mg/dL   Total Protein 7.6 6.5 - 8.1 g/dL   Albumin 3.8 3.5 - 5.0 g/dL   AST 39 15 - 41 U/L   ALT 40 0 - 44 U/L   Alkaline Phosphatase 77 38 - 126 U/L   Total Bilirubin 0.5 0.3 - 1.2 mg/dL   GFR, Estimated >30>60 >86>60 mL/min    Comment: (NOTE) Calculated using the CKD-EPI Creatinine Equation (2021)    Anion gap 6 5 - 15    Comment: Performed at [facility], [address] [place], [place] [address]  Ethanol     Status: None   Collection Time: 03/12/22 10:53 AM  Result Value Ref Range   Alcohol, Ethyl (B) <10 <10 mg/dL    Comment: (NOTE) Lowest detectable limit for serum alcohol is 10 mg/dL.  For medical purposes only. Performed at [facility], [address] [place], [place] [address]   Salicylate level     Status: Abnormal   Collection Time: 03/12/22 10:53 AM  Result Value Ref Range   Salicylate Lvl <7.0 (L) 7.0 - 30.0 mg/dL    Comment: Performed at [facility], [address] [place], [place] [address]  Acetaminophen level     Status: Abnormal   Collection Time: 03/12/22 10:53 AM  Result Value Ref Range   Acetaminophen (Tylenol), Serum <10 (L) 10 - 30 ug/mL    Comment: (NOTE) Therapeutic  concentrations vary significantly. A range of 10-30 ug/mL  may be an effective concentration for many patients. However, some  are best treated at concentrations outside of this range. Acetaminophen concentrations >150 ug/mL at 4 hours after ingestion  and >50 ug/mL at 12 hours after ingestion are often associated with  toxic reactions.  Performed at [facility], [address] [place], [place] [address]   cbc     Status: Abnormal  Collection Time: 03/12/22 10:53 AM  Result Value Ref Range   WBC 10.8 (H) 4.0 - 10.5 K/uL   RBC 3.43 (L) 3.87 - 5.11 MIL/uL   Hemoglobin 10.0 (L) 12.0 - 15.0 g/dL   HCT 47.4 (L) 25.9 - 56.3 %   MCV 88.6 80.0 - 100.0 fL   MCH 29.2 26.0 - 34.0 pg   MCHC 32.9 30.0 - 36.0 g/dL   RDW 87.5 64.3 - 32.9 %   Platelets 280 150 - 400 K/uL   nRBC 0.0 0.0 - 0.2 %    Comment: Performed at [facility], [address] [place], [name] [address]    No current facility-administered medications for this encounter.   Current Outpatient Medications  Medication Sig Dispense Refill   atorvastatin (LIPITOR) 10 MG tablet Take 10 mg by mouth at bedtime.     carbamazepine (TEGRETOL) 200 MG tablet Take 400 mg by mouth 2 (two) times daily.     cholecalciferol (VITAMIN D) 1000 units tablet Take 1,000 Units by mouth daily.      clonazePAM (KLONOPIN) 1 MG tablet Take 1 mg by mouth 2 (two) times daily. 2 mg this morn     gabapentin (NEURONTIN) 800 MG tablet Take 800 mg by mouth 3 (three) times daily.     levothyroxine (SYNTHROID, LEVOTHROID) 175 MCG tablet Take 175 mcg by mouth daily before breakfast.     lisinopril (PRINIVIL,ZESTRIL) 20 MG tablet Take 20 mg by mouth at bedtime.     lithium carbonate 300 MG capsule Take 300-600 mg by mouth 2 (two) times daily with a meal. 300 mg qam and 600 mg qhs     LORazepam ER (LOREEV XR) 2 MG CS24 Take 2 mg by mouth in the morning.     medroxyPROGESTERone (DEPO-PROVERA) 150 MG/ML injection Inject into the muscle.      Multiple Vitamin (THEREMS PO) Take 1 tablet by mouth daily.     paliperidone Palmitate ER (INVEGA TRINZA) 819 MG/2.63ML injection      propranolol (INDERAL) 40 MG tablet Take 40 mg by mouth 2 (two) times daily.     QUEtiapine (SEROQUEL XR) 400 MG 24 hr tablet Take 400 mg by mouth at bedtime.     QUEtiapine (SEROQUEL) 50 MG tablet Take 50 mg by mouth at bedtime.     TRULICITY 0.75 MG/0.5ML SOPN Inject into the skin.     acetaminophen (TYLENOL) 325 MG tablet Take 650 mg by mouth every 6 (six) hours as needed for mild pain or moderate pain.     albuterol (VENTOLIN HFA) 108 (90 Base) MCG/ACT inhaler Inhale 2 puffs into the lungs every 4 (four) hours as needed.     metFORMIN (GLUCOPHAGE) 500 MG tablet Take 1,000 mg by mouth 2 (two) times daily.  (Patient not taking: Reported on 03/12/2022)     ondansetron (ZOFRAN-ODT) 4 MG disintegrating tablet Take 1 tablet (4 mg total) by mouth every 8 (eight) hours as needed for nausea or vomiting. 12 tablet 0   QUEtiapine (SEROQUEL XR) 300 MG 24 hr tablet Take by mouth. (Patient not taking: Reported on 12/12/2021)      Musculoskeletal: Strength & Muscle Tone: within normal limits Gait & Station: normal Patient leans: N/A  Psychiatric Specialty Exam: Physical Exam Vitals and nursing note reviewed.  Constitutional:      Appearance: Normal appearance.  HENT:     Head: Normocephalic.     Nose: Nose normal.  Pulmonary:     Effort: Pulmonary effort is normal.  Musculoskeletal:  General: Normal range of motion.     Cervical back: Normal range of motion.  Neurological:     General: No focal deficit present.     Mental Status: She is oriented to person, place, and time.  Psychiatric:        Attention and Perception: Attention and perception normal.        Mood and Affect: Mood normal.        Speech: Speech normal.        Behavior: Behavior normal. Behavior is cooperative.        Thought Content: Thought content normal.        Cognition and Memory:  Cognition is impaired.        Judgment: Judgment is impulsive.     Review of Systems  All other systems reviewed and are negative.   Blood pressure (!) 150/83, pulse (!) 55, temperature 97.6 F (36.4 C), temperature source Oral, resp. rate 18, height 5\' 2"  (1.575 m), weight 131.5 kg, SpO2 95 %.Body mass index is 53.04 kg/m.  General Appearance: Casual  Eye Contact:  Fair  Speech:  Normal Rate  Volume:  Normal  Mood:  Euthymic  Affect:  Congruent  Thought Process:  Coherent  Orientation:  Full (Time, Place, and Person)  Thought Content:  WDL and Logical  Suicidal Thoughts:  No  Homicidal Thoughts:  No  Memory:  Immediate;   Fair Recent;   Fair Remote;   Fair  Judgement:  Impaired  Insight:  Fair  Psychomotor Activity:  Normal  Concentration:  Concentration: Fair and Attention Span: Fair  Recall:  of Knowledge:  Fair  Language:  Good  Akathisia:  No  Handed:  Right  AIMS (if indicated):     Assets:  Housing Leisure Time Physical Health Resilience Social Support  ADL's:  Intact  Cognition:  Impaired,  Mild  Sleep:        Physical Exam: Physical Exam Vitals and nursing note reviewed.  Constitutional:      Appearance: Normal appearance.  HENT:     Head: Normocephalic.     Nose: Nose normal.  Pulmonary:     Effort: Pulmonary effort is normal.  Musculoskeletal:        General: Normal range of motion.     Cervical back: Normal range of motion.  Neurological:     General: No focal deficit present.     Mental Status: She is oriented to person, place, and time.  Psychiatric:        Attention and Perception: Attention and perception normal.        Mood and Affect: Mood normal.        Speech: Speech normal.        Behavior: Behavior normal. Behavior is cooperative.        Thought Content: Thought content normal.        Cognition and Memory: Cognition is impaired.        Judgment: Judgment is impulsive.    Review of Systems  All other systems  reviewed and are negative.  Blood pressure (!) 150/83, pulse (!) 55, temperature 97.6 F (36.4 C), temperature source Oral, resp. rate 18, height 5\' 2"  (1.575 m), weight 131.5 kg, SpO2 95 %. Body mass index is 53.04 kg/m.  Treatment Plan Summary: Stress reaction with mixed disturbance of emotion and conduct: Return to group home   Disposition: Patient does not meet criteria for psychiatric inpatient admission.  [employer], NP 03/12/2022 1:02 PM

## 2022-03-12 NOTE — ED Notes (Signed)
Mother updated on patient condition 

## 2022-03-12 NOTE — ED Notes (Signed)
Pt at CT

## 2022-03-12 NOTE — ED Triage Notes (Signed)
Pt arrives via EMS after she was found face down on the sidewalk- pt had ran away from group home after her some of her privileges were taken away- pt has soiled self with stool- pt told one of the emts that she was suicidal but denies now- pt states she "has a sickness" with diarrhea

## 2022-03-12 NOTE — ED Notes (Signed)
VOL, pend D/C

## 2022-03-12 NOTE — ED Notes (Signed)
Patient took shower and got cleaned up. Change of bed sheets was provided.

## 2022-03-12 NOTE — ED Notes (Signed)
Writer got in touch with pt's mother, request mother if she can contact the group home to come pick her up.

## 2022-03-12 NOTE — ED Notes (Signed)
Patient Items:  Flower Pajamas

## 2022-03-12 NOTE — ED Notes (Signed)
Group home contacted- will be here at 6 to pick patient up

## 2022-03-12 NOTE — ED Notes (Signed)
Patient is resting in bed at this time.

## 2022-03-12 NOTE — ED Provider Notes (Signed)
[facility] Provider Note    Event Date/Time   First MD Initiated Contact with Patient 03/12/22 1048     (approximate)   History   Suicidal   HPI  Dawn Lambert is a 31 y.o. female with intellectual disability who was found facedown on the sidewalk.  Patient ran away from the group home after she reports getting into an argument with her mom about her not being able to go home for the holidays as well as she was having concerns that she was having nausea vomiting diarrhea and abdominal pain and they were taking her seriously and bring her into the hospital.  Patient reports a few episodes of nausea vomiting, diarrhea.  No blood in the diarrhea.  She reports some lower abdominal discomfort as well.  She reports being concerned she may have a UTI as well.  She does report that when she was running that she fell and she tripped and hit her head.  She denies any SI at this time  Physical Exam   Triage Vital Signs: ED Triage Vitals [03/12/22 1046]  Enc Vitals Group     BP      Pulse      Resp      Temp      Temp src      SpO2      Weight 290 lb (131.5 kg)     Height 5\' 2"  (1.575 m)     Head Circumference      Peak Flow      Pain Score      Pain Loc      Pain Edu?      Excl. in GC?     Most recent vital signs: Vitals:   03/12/22 1049  BP: (!) 150/83  Pulse: (!) 55  Resp: 18  Temp: 97.6 F (36.4 C)  SpO2: 95%     General: Awake, no distress.  CV:  Good peripheral perfusion.  Resp:  Normal effort.  Abd:  No distention.  Tender in the lower abdomen Other:     ED Results / Procedures / Treatments   Labs (all labs ordered are listed, but only abnormal results are displayed) Labs Reviewed  COMPREHENSIVE METABOLIC PANEL - Abnormal; Notable for the following components:      Result Value   Glucose, Bld 109 (*)    All other components within normal limits  SALICYLATE LEVEL - Abnormal; Notable for the following components:   Salicylate  Lvl <7.0 (*)    All other components within normal limits  ACETAMINOPHEN LEVEL - Abnormal; Notable for the following components:   Acetaminophen (Tylenol), Serum <10 (*)    All other components within normal limits  CBC - Abnormal; Notable for the following components:   WBC 10.8 (*)    RBC 3.43 (*)    Hemoglobin 10.0 (*)    HCT 30.4 (*)    All other components within normal limits  RESP PANEL BY RT-PCR (FLU A&B, COVID) ARPGX2  C DIFFICILE QUICK SCREEN W PCR REFLEX    ETHANOL  URINE DRUG SCREEN, QUALITATIVE ([facility] ONLY)  POC URINE PREG, ED       RADIOLOGY I have reviewed the ct head personally and no ICH    PROCEDURES:  Critical Care performed: No  Procedures   MEDICATIONS ORDERED IN ED: Medications  acetaminophen (TYLENOL) tablet 1,000 mg (1,000 mg Oral Given 03/12/22 1208)     IMPRESSION / MDM / ASSESSMENT AND PLAN / ED  COURSE  I reviewed the triage vital signs and the nursing notes.   Patient's presentation is most consistent with acute presentation with potential threat to life or bodily function.   Patient comes in with concerns for nausea vomiting diarrhea abdominal discomfort can expect this to be gastroenteritis but she reports significant lower abdominal tenderness.  Will get CT imaging to further evaluate especially given she reports a full historian given intellectual disability so unable intracranial hemorrhage and cervical fracture.  No chest wall tenderness or chest pain.  She is currently denying SI but is requesting to see psychiatry today.  CBC shows stable hemoglobin white count elevated but downtrending CMP normal EtOH negative subsequent negative Tylenol negative.  CT imaging of the abdomen head and neck are reassuring.  There is a small lower nodule that I discussed with patient she can follow-up in 12 months  Seen by psychiatry and cleared.  Patient will be discharged after urine to evaluate for UTI  The patient is on the cardiac monitor to  evaluate for evidence of arrhythmia and/or significant heart rate changes.      FINAL CLINICAL IMPRESSION(S) / ED DIAGNOSES   Final diagnoses:  Pulmonary nodule  Gastroenteritis     Rx / DC Orders   ED Discharge Orders          Ordered    AMB  Referral to Pulmonary Nodule Clinic        03/12/22 1519    ondansetron (ZOFRAN-ODT) 4 MG disintegrating tablet  Every 8 hours PRN        03/12/22 1520             Note:  This document was prepared using Dragon voice recognition software and may include unintentional dictation errors.   Concha Se, MD 03/12/22 [phone]

## 2022-03-12 NOTE — Discharge Instructions (Addendum)
Your work-up was reassuring return to the ER if develop worsening symptoms or any other concerns.  Suspect he most likely have a viral illness.  I prescribed nausea meds to help with any nausea.  IMPRESSION: 1. No acute findings within the abdomen or pelvis. 2. 5 mm right lower lobe pulmonary nodule. There are no consensus for this age group. If the patient is at increased risk consider follow-up imaging in 12 months.

## 2022-03-12 NOTE — ED Notes (Signed)
Pt back from CT- unable to provide stool sample at this time

## 2022-03-12 NOTE — ED Notes (Signed)
This Clinical research associate called pt's group home multiple times with no answer. Pt is discharged and waiting on her group home for transportation. Reassured patient. Will continue to follow up and circle back.

## 2022-03-13 DIAGNOSIS — S0990XA Unspecified injury of head, initial encounter: Secondary | ICD-10-CM | POA: Diagnosis not present

## 2022-03-13 NOTE — ED Notes (Signed)
Patient moved to ED BHU 3.  Patient oriented to unit with rounding and cameras, patient verbalizes understanding.

## 2022-03-13 NOTE — ED Notes (Signed)
Pt group home called this NT phone and stated they are on the way to pick her up and will be here around 10 am.

## 2022-03-13 NOTE — ED Notes (Signed)
Pt in stable condtion discharged to [facility] group home. All belongings returned to patient.  Pt denies SI/HI/AV/H and contracts for safety. Pt escorted to main lobby by nursing staff where she was met by [facility] group home staff to drive her home safely.

## 2022-03-13 NOTE — ED Notes (Signed)
Hospital meal provided, pt tolerated w/o complaints.  Waste discarded appropriately.  

## 2022-08-13 ENCOUNTER — Encounter: Payer: Self-pay | Admitting: Emergency Medicine

## 2022-08-13 ENCOUNTER — Emergency Department
Admission: EM | Admit: 2022-08-13 | Discharge: 2022-08-14 | Disposition: A | Discharge status: Home / Self Care | Payer: 59 | Attending: Emergency Medicine | Admitting: Emergency Medicine

## 2022-08-13 ENCOUNTER — Other Ambulatory Visit: Payer: Self-pay

## 2022-08-13 DIAGNOSIS — M25561 Pain in right knee: Secondary | ICD-10-CM | POA: Diagnosis not present

## 2022-08-13 DIAGNOSIS — E119 Type 2 diabetes mellitus without complications: Secondary | ICD-10-CM | POA: Insufficient documentation

## 2022-08-13 DIAGNOSIS — F430 Acute stress reaction: Secondary | ICD-10-CM | POA: Diagnosis present

## 2022-08-13 DIAGNOSIS — M25562 Pain in left knee: Secondary | ICD-10-CM | POA: Insufficient documentation

## 2022-08-13 DIAGNOSIS — F79 Unspecified intellectual disabilities: Secondary | ICD-10-CM

## 2022-08-13 DIAGNOSIS — G8929 Other chronic pain: Secondary | ICD-10-CM | POA: Insufficient documentation

## 2022-08-13 DIAGNOSIS — I10 Essential (primary) hypertension: Secondary | ICD-10-CM | POA: Insufficient documentation

## 2022-08-13 MED ORDER — QUETIAPINE FUMARATE ER 200 MG PO TB24
400.0000 mg | ORAL_TABLET | Freq: Every day | ORAL | Status: DC
  Administered 2022-08-13: 400 mg via ORAL
  Filled 2022-08-13: qty 2

## 2022-08-13 MED ORDER — TRAZODONE HCL 50 MG PO TABS: 50.0000 mg | ORAL_TABLET | Freq: Every evening | ORAL | Status: DC | PRN

## 2022-08-13 MED ORDER — QUETIAPINE FUMARATE 25 MG PO TABS
50.0000 mg | ORAL_TABLET | Freq: Every day | ORAL | Status: DC
  Administered 2022-08-13: 50 mg via ORAL
  Filled 2022-08-13: qty 2

## 2022-08-13 NOTE — ED Notes (Signed)
TTS at bedside. 

## 2022-08-13 NOTE — BH Assessment (Signed)
Comprehensive Clinical Assessment (CCA) Screening, Triage and Referral Note  08/13/2022 ACCACIA DIMURO [id number]  Chief Complaint:  Chief Complaint  Patient presents with   Knee Pain   Visit Diagnosis: IDD/Mental Retardation  Dawn Lambert is 32 year old female who presents to the ER from group, with complaints of leg and ankle pain. While in the ER she voiced SI without a plan. Per the patient, she has no thoughts or plans of ending her life. As well as no gestures. She admits to saying it, because she thought it would get her faster. Patient is well known to the ER for similar visits. She voices SI when she is upset with the group home or even if she wants to come visit the ER. After arrival she would deny having the thoughts and request to return to the facility. She shared with TTS, she was ready to return to the group home after she was informed she had to see a someone else for her ankle and leg. Per her report, she fell over year ago and wasn't having any pain. Throughout the interview, she denied SI/HI and AV/H. She asked TTS several when can she go home. She was able to provide the contact information for the home, for collateral information.    Patient Reported Information How did you hear about [name]? Self  What Is the Reason for Your Visit/Call Today? Patient came to the ER due to old leg and ankle fall and reported SI.  How Long Has This Been Causing You Problems? <Week  What Do You Feel Would Help You the Most Today? Treatment for Depression or other mood problem   Have You Recently Had Any Thoughts About Hurting Yourself? No  Are You Planning to Commit Suicide/Harm Yourself At This time? No   Have you Recently Had Thoughts About Hurting Someone [name]? No  Are You Planning to Harm Someone at This Time? No  Explanation: No data recorded  Have You Used Any Alcohol or Drugs in the Past 24 Hours? No  How Long Ago Did You Use Drugs or Alcohol? No data recorded What  Did You Use and How Much? No data recorded  Do You Currently Have a Therapist/Psychiatrist? Yes  Name of Therapist/Psychiatrist: Through group home.   Have You Been Recently Discharged From Any Office Practice or Programs? No  Explanation of Discharge From Practice/Program: No data recorded   CCA Screening Triage Referral Assessment Type of Contact: Face-to-Face  Telemedicine Service Delivery:   Is this Initial or Reassessment?   Date Telepsych consult ordered in [facility]:    Time Telepsych consult ordered in [facility]:    Location of Assessment: [facility] ED  Provider Location: [facility] ED    Collateral Involvement: None provided   Does Patient Have a Court Appointed Legal Guardian? No data recorded Name and Contact of Legal Guardian: No data recorded If Minor and Not Living with Parent(s), Who has Custody? n/a  Is CPS involved or ever been involved? Never  Is APS involved or ever been involved? Never   Patient Determined To Be At Risk for Harm To Self or Others Based on Review of Patient Reported Information or Presenting Complaint? No  Method: No data recorded Availability of Means: No data recorded Intent: No data recorded Notification Required: No data recorded Additional Information for Danger to Others Potential: No data recorded Additional Comments for Danger to Others Potential: No data recorded Are There Guns or Other Weapons in Your Home? No  Types of Guns/Weapons: No data  recorded Are These Weapons Safely Secured?                            No data recorded Who Could Verify You Are Able To Have These Secured: No data recorded Do You Have any Outstanding Charges, Pending Court Dates, Parole/Probation? No data recorded Contacted To Inform of Risk of Harm To Self or Others: No data recorded  Does Patient Present under Involuntary Commitment? No    County of Residence: [place]   Patient Currently Receiving the Following Services: Medication Management   Determination  of Need: Emergent (2 hours)   Options For Referral: Outpatient Therapy; Group Home   Discharge Disposition:    Lilyan Gilford MS, LCAS, [facility], [facility] Therapeutic Triage Specialist 08/13/2022 6:27 PM

## 2022-08-13 NOTE — ED Notes (Addendum)
Pt states she fell a year ago on left ankle and continues to have pain with it. Pt also endorses pain in both knees, and a "stiff back". Dr Lenard Lance at bedside to asses pt and pt requesting xrays of knees, ankle and spine. Dr. Lenard Lance explained she would need to see an orthopedist and get an MRI for something that happened so long ago and due to her stating SI at group home psych will need to be consulted. Pt in agreement with all of the above.

## 2022-08-13 NOTE — ED Notes (Signed)
Pt questioning when she can leave, educated on plan of care and pt agrees and expresses understanding. Denies SI, HI, AVH

## 2022-08-13 NOTE — BH Assessment (Signed)
Writer attempted to contacted the Group Home [phone]) for collateral information but was unable to leave a voicemail message because the mailbox was full.

## 2022-08-13 NOTE — ED Triage Notes (Signed)
Pt brought in by [facility] from group home. Pt was brought in because pt reporting to group home staff she was SI. Pt stating in triage "I'm not really suicidal I'm just here because my knees and ankles hurt." "I don't like the staff at the group home on the weekends either."

## 2022-08-13 NOTE — ED Notes (Signed)
Pt given sandwich tray and cup of water. 

## 2022-08-13 NOTE — ED Notes (Signed)
Pharmacy contacted at this time requesting med rec. States will complete

## 2022-08-13 NOTE — ED Provider Notes (Signed)
   [facility] Provider Note    Event Date/Time   First MD Initiated Contact with Patient 08/13/22 1728     (approximate)  History   Chief Complaint: Knee Pain  HPI  Dawn Lambert is a 32 y.o. female with a past medical history of anxiety, diabetes, hypertension, mental retardation, presents to the emergency department from her group facility for SI complaints.  According to the patient she was having knee and back pain which she has been experiencing for over a year.  She states they would not give her any Tylenol or ibuprofen and they would not take her to the hospital so she told them that she was going to kill herself so that they would bring her to the hospital.  Here the patient denies any SI or HI but did tell the triage nurse that she did not like the group home staff on the weekends which is why she wanted to leave and come here.  Patient denies any recent trauma.  Ambulating without difficulty.  Physical Exam   Triage Vital Signs: ED Triage Vitals [08/13/22 1626]  Enc Vitals Group     BP 130/72     Pulse Rate 81     Resp 15     Temp 98.5 F (36.9 C)     Temp Source Oral     SpO2 100 %     Weight      Height      Head Circumference      Peak Flow      Pain Score 7     Pain Loc      Pain Edu?      Excl. in GC?     Most recent vital signs: Vitals:   08/13/22 1626  BP: 130/72  Pulse: 81  Resp: 15  Temp: 98.5 F (36.9 C)  SpO2: 100%    General: Awake, no distress.  CV:  Good peripheral perfusion.  Regular rate and rhythm  Resp:  Normal effort.  Equal breath sounds bilaterally.  Abd:  No distention.  Soft, nontender.  No rebound or guarding. Other:  Good range of motion in extremities.   ED Results / Procedures / Treatments   MEDICATIONS ORDERED IN ED: Medications - No data to display   IMPRESSION / MDM / ASSESSMENT AND PLAN / ED COURSE  I reviewed the triage vital signs and the nursing notes.  Patient's presentation is  most consistent with acute presentation with potential threat to life or bodily function.  Patient presents emergency department with complaints of knee and back pain.  States symptoms have been ongoing for over a year, she believes since 2019.  Denies any new trauma.  I will place an orthopedic referral for the patient to follow-up with orthopedics regarding her chronic pain.  Patient has no acute symptoms and is asking for a dinner tray.  Denies SI or HI.  Will have psychiatry evaluate given her complaint of SI and HI at her group facility.  Psychiatric evaluation pending.  Patient care signed out to oncoming provider.  FINAL CLINICAL IMPRESSION(S) / ED DIAGNOSES   Knee pain Chronic pain   Note:  This document was prepared using Dragon voice recognition software and may include unintentional dictation errors.   Minna Antis, MD 08/13/22 2308

## 2022-08-13 NOTE — ED Notes (Signed)
VOL, pend psych consult 

## 2022-08-13 NOTE — BH Assessment (Addendum)
Writer spoke with Constance [facility]ster [facility] care provider [phone]) for collateral information. Constance Holster reported that the pt has manipulative tendencies and had a tantrum after being told that she could not have an item. Constance Holster explained that the pt reacted by walking down the street and laying on the curb. Constance Holster expressed no concerns about the pt's safety.  Please be advised. Constance Holster reported that the group home would be unable to pick up pt tonight. Follow up needed.

## 2022-08-14 DIAGNOSIS — F430 Acute stress reaction: Secondary | ICD-10-CM | POA: Diagnosis not present

## 2022-08-14 NOTE — Consult Note (Signed)
[facility] Face-to-Face Psychiatry Consult   Reason for Consult:  expressed SI when in the ED for leg pain Referring Physician:  EDP Patient Identification: Dawn Lambert MRN:  [id number] Principal Diagnosis: Stress reaction causing mixed disturbance of emotion and conduct Diagnosis:  Principal Problem:   Stress reaction causing mixed disturbance of emotion and conduct Active Problems:   Intellectual disability   Total Time spent with patient: 15 minutes  Subjective:   Dawn Lambert is a 32 y.o. female patient admitted with expression of SI when she came to ED for leg pain. Dawn Lambert  HPI:  Patient presented to the ED yesterday afternoon (4/14) with group home personnel for leg pain and expressed SI because she doesn't like the week-end staff at her group home.   On evaluation today, patient is calm and cooperative. She reports that she doesn't like the week-end group home staff. Patient is well-known to this facility, presenting to the ED when she gets upset with staff, mostly after she is told she can't do something she wants or can't have something she wants. Patient states that she knows she needs to return to the group home. Patient denies suicidal thought or intent. Denies auditory or visual hallucinations. She does not appear to be She has intellectual disability, but is able to converse in a manner that is understandable. It appears that she has poor impulse control when she does not get what she wants. Discussion with patient regarding saying things she doesn't mean. She is able to agree to not threaten suicide if she doesn't mean it.  Patient does not require or meet criteria for inpatient psychiatric hospitalization       Past Psychiatric History: IDD; SI; Depression  Risk to Self:   Risk to Others:   Prior Inpatient Therapy:   Prior Outpatient Therapy:    Past Medical History:  Past Medical History:  Diagnosis Date   Anxiety    Arnold-Chiari malformation    Atrial septal defect     Complication of anesthesia    when pt was 11, woke up during surgery (muscle biopsy)   Constipation    Contraception management    Dandruff    Dermatomyositis    age 25   Diabetes mellitus without complication    Elevated blood pressure    Hypertension    Hypothyroidism    Mental retardation    Mood disorder    Overweight(278.02)    Pelvic pain    Prediabetes     Past Surgical History:  Procedure Laterality Date   ASD REPAIR     at age 38   UMBILICAL HERNIA REPAIR N/A 09/17/2017   Procedure: HERNIA REPAIR UMBILICAL ADULT;  Surgeon: Earline Mayotte, MD;  Location: [facility] ORS;  Service: General;  Laterality: N/A;   Family History: History reviewed. No pertinent family history. Family Psychiatric  History:  Social History:  Social History   Substance and Sexual Activity  Alcohol Use No     Social History   Substance and Sexual Activity  Drug Use No    Social History   Socioeconomic History   Marital status: Single    Spouse name: Not on file   Number of children: Not on file   Years of education: Not on file   Highest education level: Not on file  Occupational History   Not on file  Tobacco Use   Smoking status: Never   Smokeless tobacco: Never  Vaping Use   Vaping Use: Never used  Substance and  Sexual Activity   Alcohol use: No   Drug use: No   Sexual activity: Not on file  Other Topics Concern   Not on file  Social History Narrative   Not on file   Social Determinants of Health   Financial Resource Strain: Not on file  Food Insecurity: Not on file  Transportation Needs: Not on file  Physical Activity: Not on file  Stress: Not on file  Social Connections: Not on file   Additional Social History:    Allergies:   Allergies  Allergen Reactions   Amoxicillin-Pot Clavulanate Other (See Comments)    Has patient had a PCN reaction causing immediate rash, facial/tongue/throat swelling, SOB or lightheadedness with hypotension: ______  Has patient  had a PCN reaction causing severe rash involving mucus membranes or skin necrosis: _______  Has patient had a PCN reaction that required hospitalization: _______  Has patient had a PCN reaction occurring within the last 10 years:_______  If all of the above answers are "NO", then may proceed with Cephalosporin use.  Other Reaction(s): Unknown  Other reaction(s): Other (See Comments)  Has patient had a PCN reaction causing immediate rash, facial/tongue/throat swelling, SOB or lightheadedness with hypotension: ______  Has patient had a PCN reaction causing severe rash involving mucus membranes or skin necrosis: _______  Has patient had a PCN reaction that required hospitalization: _______  Has patient had a PCN reaction occurring within the last 10 years:_______  If all of the above answers are "NO", then may proceed with Cephalosporin use.    Labs: No results found for this or any previous visit (from the past 48 hour(s)).  No current facility-administered medications for this encounter.   Current Outpatient Medications  Medication Sig Dispense Refill   acetaminophen (TYLENOL) 325 MG tablet Take 650 mg by mouth every 6 (six) hours as needed for mild pain or moderate pain.     albuterol (VENTOLIN HFA) 108 (90 Base) MCG/ACT inhaler Inhale 2 puffs into the lungs every 4 (four) hours as needed.     atorvastatin (LIPITOR) 10 MG tablet Take 10 mg by mouth at bedtime.     carbamazepine (TEGRETOL) 200 MG tablet Take 400 mg by mouth 2 (two) times daily.     cholecalciferol (VITAMIN D) 1000 units tablet Take 1,000 Units by mouth daily.      clonazePAM (KLONOPIN) 1 MG tablet Take 1 mg by mouth 2 (two) times daily. 2 mg this morn     gabapentin (NEURONTIN) 800 MG tablet Take 800 mg by mouth 3 (three) times daily.     guaifenesin (ROBITUSSIN) 100 MG/5ML syrup Take 200 mg by mouth 4 (four) times daily as needed for cough.     levothyroxine (SYNTHROID, LEVOTHROID) 175 MCG tablet Take 175 mcg by mouth  daily before breakfast.     lisinopril (PRINIVIL,ZESTRIL) 20 MG tablet Take 20 mg by mouth at bedtime.     lithium carbonate 300 MG capsule Take 300-600 mg by mouth 2 (two) times daily with a meal. 300 mg qam and 600 mg qhs     LORazepam ER (LOREEV XR) 2 MG CS24 Take 2 mg by mouth in the morning.     metFORMIN (GLUCOPHAGE) 500 MG tablet Take 1,000 mg by mouth 2 (two) times daily.     Multiple Vitamin (THEREMS PO) Take 1 tablet by mouth daily.     propranolol (INDERAL) 40 MG tablet Take 40 mg by mouth 2 (two) times daily.     QUEtiapine (SEROQUEL XR) 300  MG 24 hr tablet Take by mouth.     QUEtiapine (SEROQUEL XR) 400 MG 24 hr tablet Take 400 mg by mouth at bedtime.     QUEtiapine (SEROQUEL) 50 MG tablet Take 50 mg by mouth at bedtime.      Musculoskeletal: Strength & Muscle Tone: within normal limits Gait & Station: normal Patient leans: N/A   Psychiatric Specialty Exam:  Presentation  General Appearance:  Appropriate for Environment  Eye Contact: Good  Speech: Clear and Coherent  Speech Volume: Normal  Handedness: Right   Mood and Affect  Mood: Euthymic  Affect: Congruent   Thought Process  Thought Processes: Linear (at baseline)  Descriptions of Associations:Intact  Orientation:Full (Time, Place and Person)  Thought Content:Other (comment); WDL (Intellectual disability. At baseline)  History of Schizophrenia/Schizoaffective disorder:No  Duration of Psychotic Symptoms:No data recorded Hallucinations:Hallucinations: None  Ideas of Reference:None  Suicidal Thoughts:Suicidal Thoughts: No  Homicidal Thoughts:Homicidal Thoughts: No   Sensorium  Memory: Immediate Good; Recent Good  Judgment: Fair  Insight: Fair   Art therapist  Concentration: Fair  Attention Span: Fair  Recall: Fair  Fund of Knowledge: Fair  Language: Fair   Psychomotor Activity  Psychomotor Activity:Psychomotor Activity: Normal   Assets   Assets: Housing; Health and safety inspector; Physical Health; Resilience; Social Support   Sleep  Sleep:Sleep: Good   Physical Exam: Physical Exam Vitals and nursing note reviewed.  HENT:     Head: Normocephalic.     Nose: No congestion or rhinorrhea.  Eyes:     General:        Right eye: No discharge.        Left eye: No discharge.  Pulmonary:     Effort: Pulmonary effort is normal.  Musculoskeletal:     Cervical back: Normal range of motion.  Skin:    General: Skin is dry.  Neurological:     Mental Status: She is alert and oriented to person, place, and time.  Psychiatric:        Attention and Perception: Attention normal.        Mood and Affect: Mood normal.        Speech: Speech normal.        Behavior: Behavior normal. Behavior is cooperative.        Thought Content: Thought content normal. Thought content is not paranoid or delusional. Thought content does not include homicidal or suicidal ideation.        Cognition and Memory: Cognition is impaired (at baseline).        Judgment: Judgment is impulsive (at baseline).    Review of Systems  Constitutional: Negative.   HENT: Negative.    Respiratory: Negative.    Cardiovascular: Negative.   Musculoskeletal: Negative.   Skin: Negative.    Blood pressure 134/76, pulse 79, temperature 98.7 F (37.1 C), temperature source Oral, resp. rate 18, SpO2 97 %. There is no height or weight on file to calculate BMI.  Treatment Plan Summary: Patient  does  not meet criteria for inpatient psychiatric admission. She only expresses that she does get upset when she doesn't get what she wants and it is difficult for her to wait sometimes. She does not express active suicidal thoughts. Denies HI/AVH. Reviewed with EDP  Disposition: No evidence of imminent risk to self or others at present.   Patient does not meet criteria for psychiatric inpatient admission. Discussed crisis plan, support from social network, calling 911,  coming to the Emergency Department, and calling Suicide Hotline.  Vanetta Mulders,  NP 08/14/2022 3:55 PM

## 2022-08-14 NOTE — ED Notes (Signed)
VOL/ Consult Pending  

## 2022-08-14 NOTE — Discharge Instructions (Signed)
You were cleared both medically and psychiatrically to follow-up as an outpatient.  Please follow-up with Dr. Hyacinth Meeker or one of his colleagues in orthopedics to discuss additional things that may help your knees.  Continue on your regular medications including your scheduled Tylenol.  Please follow-up with your regular psychiatrist or therapist, or consider going to [facility] for additional assistance.

## 2022-08-14 NOTE — ED Provider Notes (Signed)
Emergency Medicine Observation Re-evaluation Note  Dawn Lambert is a 32 y.o. female, seen on rounds today.  Pt initially presented to the ED for complaints of Knee Pain Currently, the patient is resting.  Physical Exam  BP 125/77 (BP Location: Right Arm)   Pulse 68   Temp 98.4 F (36.9 C) (Oral)   Resp 15   SpO2 98%  Physical Exam Gen:  No acute distress Resp:  Breathing easily and comfortably, no accessory muscle usage Neuro:  Moving all four extremities, no gross focal neuro deficits Psych:  Resting currently, calm when awake  ED Course / MDM  EKG:   I have reviewed the labs performed to date as well as medications administered while in observation.  Recent changes in the last 24 hours include initial EDP evaluation and psychiatry team assessment.  Plan  Current plan is for discharge back to group home.  We have to do so last night, but the group home did not have anyone that could pick her up.  The plan is for someone to pick her up after 8:30 AM.    Dawn Rose, MD 08/14/22 0530

## 2022-08-14 NOTE — ED Notes (Signed)
Spoke to employee at [facility] [phone]) in reference to transport to [facility] per request of Dr. York Cerise. At this time she is the only resource for [facility] during night shift. States shift change occurs at 8:30 and she will have staff come to pick up pt after this. Dr. York Cerise notified via secure chat at this time

## 2022-08-14 NOTE — ED Notes (Signed)
Pt given a breakfast tray.

## 2022-08-14 NOTE — ED Notes (Signed)
Spoke with Ty at the pt's group home to inform her that the patient is ready for discharge. She stated that they are waiting for morning staff to arrive, and then they will be here to pick up pt.

## 2022-08-24 ENCOUNTER — Ambulatory Visit (INDEPENDENT_AMBULATORY_CARE_PROVIDER_SITE_OTHER): Payer: 59 | Admitting: Dermatology

## 2022-08-24 VITALS — BP 132/91 | HR 52

## 2022-08-24 DIAGNOSIS — Z79899 Other long term (current) drug therapy: Secondary | ICD-10-CM | POA: Diagnosis not present

## 2022-08-24 DIAGNOSIS — L409 Psoriasis, unspecified: Secondary | ICD-10-CM

## 2022-08-24 DIAGNOSIS — Z7189 Other specified counseling: Secondary | ICD-10-CM | POA: Diagnosis not present

## 2022-08-24 MED ORDER — FLUOCINOLONE ACETONIDE SCALP 0.01 % EX OIL: TOPICAL_OIL | CUTANEOUS | 11 refills | Status: AC

## 2022-08-24 NOTE — Progress Notes (Signed)
   Follow-Up Visit   Subjective  Dawn Lambert is a 32 y.o. female who presents for the following: Psoriasis - of the scalp, does occasionally flare. Currently using Fluocinolone oil QW.  Aide with pt and contributes to history  The following portions of the chart were reviewed this encounter and updated as appropriate: medications, allergies, medical history  Review of Systems:  No other skin or systemic complaints except as noted in HPI or Assessment and Plan.  Objective  Well appearing patient in no apparent distress; mood and affect are within normal limits.  Areas Examined: The scalp  Relevant exam findings are noted in the Assessment and Plan.     Assessment & Plan   Psoriasis  Medication management  Counseling and coordination of care   PSORIASIS Pinkness and scale of the L post auricular, Scale of the L scalp supra auricular.  Chronic and persistent condition with duration or expected duration over one year. Condition is symptomatic/ bothersome to patient. Not currently at goal.  Treatment Plan: Continue Fluocinolone oil to aa's scalp but increase to 3d/wk on Monday, Wednesday, and Friday x 6 weeks. After 6 wks, if doing well, may decrease to QW on Friday. During times of flares may increase back to 3d/wk until controlled again.   Counseling on psoriasis and coordination of care  psoriasis is a chronic non-curable, but treatable genetic/hereditary disease that may have other systemic features affecting other organ systems such as joints (Psoriatic Arthritis). It is associated with an increased risk of inflammatory bowel disease, heart disease, non-alcoholic fatty liver disease, and depression.  Treatments include light and laser treatments; topical medications; and systemic medications including oral and injectables.  Return in about 1 year (around 08/24/2023) for psoriasis follow up.  Maylene Roes, CMA, am acting as scribe for Armida Sans, MD  .  Documentation: I have reviewed the above documentation for accuracy and completeness, and I agree with the above.  Armida Sans, MD

## 2022-08-24 NOTE — Patient Instructions (Signed)
Due to recent changes in healthcare laws, you may see results of your pathology and/or laboratory studies on MyChart before the doctors have had a chance to review them. We understand that in some cases there may be results that are confusing or concerning to you. Please understand that not all results are received at the same time and often the doctors may need to interpret multiple results in order to provide you with the best plan of care or course of treatment. Therefore, we ask that you please give us 2 business days to thoroughly review all your results before contacting the office for clarification. Should we see a critical lab result, you will be contacted sooner.   If You Need Anything After Your Visit  If you have any questions or concerns for your doctor, please call our main line at [phone] and press option 4 to reach your doctor's medical assistant. If no one answers, please leave a voicemail as directed and we will return your call as soon as possible. Messages left after 4 pm will be answered the following business day.   You may also send us a message via MyChart. We typically respond to MyChart messages within 1-2 business days.  For prescription refills, please ask your pharmacy to contact our office. Our fax number is [phone].  If you have an urgent issue when the clinic is closed that cannot wait until the next business day, you can page your doctor at the number below.    Please note that while we do our best to be available for urgent issues outside of office hours, we are not available 24/7.   If you have an urgent issue and are unable to reach us, you may choose to seek medical care at your doctor's office, retail clinic, urgent care center, or emergency room.  If you have a medical emergency, please immediately call 911 or go to the emergency department.  Pager Numbers  - Dr. Kowalski: [phone]  - Dr. Moye: [phone]  - Dr. Stewart:  [phone]  In the event of inclement weather, please call our main line at [phone] for an update on the status of any delays or closures.  Dermatology Medication Tips: Please keep the boxes that topical medications come in in order to help keep track of the instructions about where and how to use these. Pharmacies typically print the medication instructions only on the boxes and not directly on the medication tubes.   If your medication is too expensive, please contact our office at [phone] option 4 or send us a message through MyChart.   We are unable to tell what your co-pay for medications will be in advance as this is different depending on your insurance coverage. However, we may be able to find a substitute medication at lower cost or fill out paperwork to get insurance to cover a needed medication.   If a prior authorization is required to get your medication covered by your insurance company, please allow us 1-2 business days to complete this process.  Drug prices often vary depending on where the prescription is filled and some pharmacies may offer cheaper prices.  The website www.goodrx.com contains coupons for medications through different pharmacies. The prices here do not account for what the cost may be with help from insurance (it may be cheaper with your insurance), but the website can give you the price if you did not use any insurance.  - You can print the associated coupon and take it with   your prescription to the pharmacy.  - You may also stop by our office during regular business hours and pick up a GoodRx coupon card.  - If you need your prescription sent electronically to a different pharmacy, notify our office through [facility] MyChart or by phone at [phone] option 4.     Si Usted Necesita Algo Despus de Su Visita  Tambin puede enviarnos un mensaje a travs de MyChart. Por lo general respondemos a los mensajes de MyChart en el transcurso de 1 a 2  das hbiles.  Para renovar recetas, por favor pida a su farmacia que se ponga en contacto con nuestra oficina. Nuestro nmero de fax es el [phone].  Si tiene un asunto urgente cuando la clnica est cerrada y que no puede esperar hasta el siguiente da hbil, puede llamar/localizar a su doctor(a) al nmero que aparece a continuacin.   Por favor, tenga en cuenta que aunque hacemos todo lo posible para estar disponibles para asuntos urgentes fuera del horario de oficina, no estamos disponibles las 24 horas del da, los 7 das de la semana.   Si tiene un problema urgente y no puede comunicarse con nosotros, puede optar por buscar atencin mdica  en el consultorio de su doctor(a), en una clnica privada, en un centro de atencin urgente o en una sala de emergencias.  Si tiene una emergencia mdica, por favor llame inmediatamente al 911 o vaya a la sala de emergencias.  Nmeros de bper  - Dr. Kowalski: [phone]  - Dra. Moye: [phone]  - Dra. Stewart: [phone]  En caso de inclemencias del tiempo, por favor llame a nuestra lnea principal al [phone] para una actualizacin sobre el estado de cualquier retraso o cierre.  Consejos para la medicacin en dermatologa: Por favor, guarde las cajas en las que vienen los medicamentos de uso tpico para ayudarle a seguir las instrucciones sobre dnde y cmo usarlos. Las farmacias generalmente imprimen las instrucciones del medicamento slo en las cajas y no directamente en los tubos del medicamento.   Si su medicamento es muy caro, por favor, pngase en contacto con nuestra oficina llamando al [phone] y presione la opcin 4 o envenos un mensaje a travs de MyChart.   No podemos decirle cul ser su copago por los medicamentos por adelantado ya que esto es diferente dependiendo de la cobertura de su seguro. Sin embargo, es posible que podamos encontrar un medicamento sustituto a menor costo o llenar un formulario para que el  seguro cubra el medicamento que se considera necesario.   Si se requiere una autorizacin previa para que su compaa de seguros cubra su medicamento, por favor permtanos de 1 a 2 das hbiles para completar este proceso.  Los precios de los medicamentos varan con frecuencia dependiendo del lugar de dnde se surte la receta y alguna farmacias pueden ofrecer precios ms baratos.  El sitio web www.goodrx.com tiene cupones para medicamentos de diferentes farmacias. Los precios aqu no tienen en cuenta lo que podra costar con la ayuda del seguro (puede ser ms barato con su seguro), pero el sitio web puede darle el precio si no utiliz ningn seguro.  - Puede imprimir el cupn correspondiente y llevarlo con su receta a la farmacia.  - Tambin puede pasar por nuestra oficina durante el horario de atencin regular y recoger una tarjeta de cupones de GoodRx.  - Si necesita que su receta se enve electrnicamente a una farmacia diferente, informe a nuestra oficina a travs de MyChart de [place]   o por telfono llamando al [phone] y presione la opcin 4.  

## 2022-09-01 ENCOUNTER — Encounter: Payer: Self-pay | Admitting: Dermatology

## 2022-10-08 ENCOUNTER — Other Ambulatory Visit: Payer: Self-pay

## 2022-10-08 ENCOUNTER — Inpatient Hospital Stay
Admission: EM | Admit: 2022-10-08 | Discharge: 2022-10-10 | DRG: 392 | Disposition: A | Discharge status: Home / Self Care | Payer: 59 | Attending: Internal Medicine | Admitting: Internal Medicine

## 2022-10-08 DIAGNOSIS — Q0700 Arnold-Chiari syndrome without spina bifida or hydrocephalus: Secondary | ICD-10-CM

## 2022-10-08 DIAGNOSIS — M549 Dorsalgia, unspecified: Secondary | ICD-10-CM | POA: Diagnosis present

## 2022-10-08 DIAGNOSIS — Z7984 Long term (current) use of oral hypoglycemic drugs: Secondary | ICD-10-CM

## 2022-10-08 DIAGNOSIS — N3000 Acute cystitis without hematuria: Secondary | ICD-10-CM

## 2022-10-08 DIAGNOSIS — E66813 Obesity, class 3: Secondary | ICD-10-CM

## 2022-10-08 DIAGNOSIS — F430 Acute stress reaction: Secondary | ICD-10-CM | POA: Diagnosis present

## 2022-10-08 DIAGNOSIS — M25561 Pain in right knee: Secondary | ICD-10-CM | POA: Diagnosis present

## 2022-10-08 DIAGNOSIS — N179 Acute kidney failure, unspecified: Secondary | ICD-10-CM

## 2022-10-08 DIAGNOSIS — I10 Essential (primary) hypertension: Secondary | ICD-10-CM | POA: Insufficient documentation

## 2022-10-08 DIAGNOSIS — G8929 Other chronic pain: Secondary | ICD-10-CM | POA: Diagnosis present

## 2022-10-08 DIAGNOSIS — F259 Schizoaffective disorder, unspecified: Secondary | ICD-10-CM | POA: Diagnosis present

## 2022-10-08 DIAGNOSIS — Z79890 Hormone replacement therapy (postmenopausal): Secondary | ICD-10-CM

## 2022-10-08 DIAGNOSIS — Z880 Allergy status to penicillin: Secondary | ICD-10-CM

## 2022-10-08 DIAGNOSIS — Z1152 Encounter for screening for COVID-19: Secondary | ICD-10-CM

## 2022-10-08 DIAGNOSIS — F251 Schizoaffective disorder, depressive type: Secondary | ICD-10-CM | POA: Diagnosis present

## 2022-10-08 DIAGNOSIS — E039 Hypothyroidism, unspecified: Secondary | ICD-10-CM | POA: Insufficient documentation

## 2022-10-08 DIAGNOSIS — N390 Urinary tract infection, site not specified: Secondary | ICD-10-CM

## 2022-10-08 DIAGNOSIS — F79 Unspecified intellectual disabilities: Secondary | ICD-10-CM

## 2022-10-08 DIAGNOSIS — K529 Noninfective gastroenteritis and colitis, unspecified: Principal | ICD-10-CM

## 2022-10-08 DIAGNOSIS — M2550 Pain in unspecified joint: Secondary | ICD-10-CM | POA: Insufficient documentation

## 2022-10-08 DIAGNOSIS — A419 Sepsis, unspecified organism: Principal | ICD-10-CM

## 2022-10-08 DIAGNOSIS — Z6841 Body Mass Index (BMI) 40.0 and over, adult: Secondary | ICD-10-CM

## 2022-10-08 DIAGNOSIS — F32A Depression, unspecified: Secondary | ICD-10-CM | POA: Diagnosis present

## 2022-10-08 DIAGNOSIS — Z79899 Other long term (current) drug therapy: Secondary | ICD-10-CM

## 2022-10-08 DIAGNOSIS — F439 Reaction to severe stress, unspecified: Secondary | ICD-10-CM | POA: Diagnosis present

## 2022-10-08 DIAGNOSIS — E1165 Type 2 diabetes mellitus with hyperglycemia: Secondary | ICD-10-CM

## 2022-10-08 DIAGNOSIS — M25562 Pain in left knee: Secondary | ICD-10-CM | POA: Diagnosis present

## 2022-10-08 MED ORDER — ONDANSETRON 4 MG PO TBDP
ORAL_TABLET | ORAL | Status: AC
  Filled 2022-10-08: qty 1

## 2022-10-08 MED ORDER — KETOROLAC TROMETHAMINE 30 MG/ML IJ SOLN
15.0000 mg | Freq: Once | INTRAMUSCULAR | Status: AC
  Administered 2022-10-09: 15 mg via INTRAVENOUS
  Filled 2022-10-08: qty 1

## 2022-10-08 MED ORDER — ONDANSETRON HCL 4 MG/2ML IJ SOLN
4.0000 mg | Freq: Once | INTRAMUSCULAR | Status: AC
  Administered 2022-10-09: 4 mg via INTRAVENOUS
  Filled 2022-10-08: qty 2

## 2022-10-08 MED ORDER — ONDANSETRON HCL 4 MG PO TABS: 4.0000 mg | ORAL_TABLET | Freq: Once | ORAL | Status: DC

## 2022-10-08 MED ORDER — ONDANSETRON 4 MG PO TBDP
4.0000 mg | ORAL_TABLET | Freq: Once | ORAL | Status: AC
  Administered 2022-10-08: 4 mg via ORAL

## 2022-10-08 MED ORDER — LACTATED RINGERS IV BOLUS
1000.0000 mL | Freq: Once | INTRAVENOUS | Status: AC
  Administered 2022-10-09: 1000 mL via INTRAVENOUS

## 2022-10-08 NOTE — ED Provider Notes (Incomplete)
[facility] Provider Note    Event Date/Time   First MD Initiated Contact with Patient 10/08/22 2257     (approximate)   History   Joint Pain and Psychiatric Evaluation   HPI  Dawn Lambert is a 32 y.o. female who presents to the ED for evaluation of Joint Pain and Psychiatric Evaluation   I review ED visit and psych evaluation for similar presentation that occurred 2 months ago.  Was similarly complaining of chronic knee and back pain for a matter of years at that time.  Psych cleared and discharged the following morning.  History of intellectual disability.  Patient presents to the ED for chronic pain and to have her medication regimen evaluated by psychiatry.  Denies any suicidal thoughts or hallucinations.  She is evaluated by psychiatry, who clears her from their perspective.  When I talk with her about her chronic pain, she begins vomiting and having copious diarrhea and just in the past few minutes.  She reports lower abdominal discomfort.  Uncertain about urinary changes  History is limited  Physical Exam   Triage Vital Signs: ED Triage Vitals  Enc Vitals Group     BP 10/08/22 2148 137/87     Pulse Rate 10/08/22 2148 (!) 104     Resp 10/08/22 2148 18     Temp 10/08/22 2148 98 F (36.7 C)     Temp Source 10/08/22 2148 Oral     SpO2 10/08/22 2148 98 %     Weight 10/08/22 2154 290 lb (131.5 kg)     Height --      Head Circumference --      Peak Flow --      Pain Score 10/08/22 2154 8     Pain Loc --      Pain Edu? --      Excl. in GC? --     Most recent vital signs: Vitals:   10/08/22 2148 10/09/22 0154  BP: 137/87 121/72  Pulse: (!) 104 92  Resp: 18 18  Temp: 98 F (36.7 C) 98.8 F (37.1 C)  SpO2: 98% 99%    General: Awake, no distress.  Obese, seems uncomfortable, actively vomiting CV:  Good peripheral perfusion.  Resp:  Normal effort.  Abd:  No distention.  Suprapubic tenderness is present, otherwise benign.  No  peritoneal features. MSK:  No deformity noted.  Neuro:  No focal deficits appreciated. Other:     ED Results / Procedures / Treatments   Labs (all labs ordered are listed, but only abnormal results are displayed) Labs Reviewed  URINALYSIS, ROUTINE W REFLEX MICROSCOPIC - Abnormal; Notable for the following components:      Result Value   Color, Urine AMBER (*)    APPearance TURBID (*)    Hgb urine dipstick MODERATE (*)    Protein, ur 30 (*)    Leukocytes,Ua LARGE (*)    Bacteria, UA MANY (*)    All other components within normal limits  URINE DRUG SCREEN, QUALITATIVE ([facility] ONLY) - Abnormal; Notable for the following components:   Tricyclic, Ur Screen POSITIVE (*)    Benzodiazepine, Ur Scrn POSITIVE (*)    All other components within normal limits  CBC WITH DIFFERENTIAL/PLATELET - Abnormal; Notable for the following components:   WBC 22.6 (*)    Hemoglobin 11.9 (*)    Neutro Abs 20.4 (*)    Monocytes Absolute 1.1 (*)    Abs Immature Granulocytes 0.10 (*)    All other  components within normal limits  COMPREHENSIVE METABOLIC PANEL - Abnormal; Notable for the following components:   CO2 19 (*)    Glucose, Bld 221 (*)    BUN 21 (*)    Creatinine, Ser 1.15 (*)    All other components within normal limits  SALICYLATE LEVEL - Abnormal; Notable for the following components:   Salicylate Lvl <7.0 (*)    All other components within normal limits  URINE CULTURE  CULTURE, BLOOD (SINGLE)  ETHANOL  HCG, QUANTITATIVE, PREGNANCY  POC URINE PREG, ED    EKG   RADIOLOGY CT abdomen/pelvis interpreted by me without evidence of urologic obstruction  Official radiology report(s): CT ABDOMEN PELVIS W CONTRAST  Result Date: 10/09/2022 CLINICAL DATA:  lower abd pain, emesis EXAM: CT ABDOMEN AND PELVIS WITH CONTRAST TECHNIQUE: Multidetector CT imaging of the abdomen and pelvis was performed using the standard protocol following bolus administration of intravenous contrast. RADIATION DOSE  REDUCTION: This exam was performed according to the departmental dose-optimization program which includes automated exposure control, adjustment of the mA and/or kV according to patient size and/or use of iterative reconstruction technique. CONTRAST:  OMNIPAQUE IOHEXOL 300 MG/ML  SOLN COMPARISON:  None Available. FINDINGS: Lower chest: No acute abnormality. Hepatobiliary: No focal liver abnormality is seen. No gallstones, gallbladder wall thickening, or biliary dilatation. Pancreas: Unremarkable Spleen: Unremarkable Adrenals/Urinary Tract: The adrenal glands are unremarkable. The kidneys are normal. The bladder is decompressed. There is, however, superimposed circumferential bladder wall thickening and a superimposed diffuse infectious or inflammatory cystitis is difficult to exclude. No perivesicular inflammatory stranding or fluid collections are identified. Stomach/Bowel: Stomach is within normal limits. Appendix appears normal. No evidence of bowel wall thickening, distention, or inflammatory changes. Vascular/Lymphatic: No significant vascular findings are present. No enlarged abdominal or pelvic lymph nodes. Reproductive: Uterus and bilateral adnexa are unremarkable. Other: No abdominal wall hernia or abnormality. No abdominopelvic ascites. Musculoskeletal: No acute or significant osseous findings. IMPRESSION: 1. Circumferential bladder wall thickening, possibly related to underlying cystitis. Correlation with urinalysis and urine culture may be helpful for further evaluation. 2. No other acute intra-abdominal pathology identified. Electronically Signed   By: Helyn Numbers M.D.   On: 10/09/2022 03:35    PROCEDURES and INTERVENTIONS:  .Critical Care  Performed by: Delton Prairie, MD Authorized by: Delton Prairie, MD   Critical care provider statement:    Critical care time (minutes):  30   Critical care time was exclusive of:  Separately billable procedures and treating other patients   Critical  care was necessary to treat or prevent imminent or life-threatening deterioration of the following conditions:  Sepsis   Critical care was time spent personally by me on the following activities:  Development of treatment plan with patient or surrogate, discussions with consultants, evaluation of patient's response to treatment, examination of patient, ordering and review of laboratory studies, ordering and review of radiographic studies, ordering and performing treatments and interventions, pulse oximetry, re-evaluation of patient's condition and review of old charts   Medications  cefTRIAXone (ROCEPHIN) 2 g in sodium chloride 0.9 % 100 mL IVPB (has no administration in time range)  ondansetron (ZOFRAN-ODT) disintegrating tablet 4 mg (0 mg Oral See Procedure Record 10/08/22 2255)  lactated ringers bolus 1,000 mL (0 mLs Intravenous Stopped 10/09/22 0249)  ondansetron (ZOFRAN) injection 4 mg (4 mg Intravenous Given 10/09/22 0019)  ketorolac (TORADOL) 30 MG/ML injection 15 mg (15 mg Intravenous Given 10/09/22 0025)  iohexol (OMNIPAQUE) 300 MG/ML solution 100 mL (100 mLs Intravenous Contrast Given 10/09/22  95)     IMPRESSION / MDM / ASSESSMENT AND PLAN / ED COURSE  I reviewed the triage vital signs and the nursing notes.  Differential diagnosis includes, but is not limited to, polysubstance abuse, suicidal, chronic pain, appendicitis, urosepsis  {Patient presents with symptoms of an acute illness or injury that is potentially life-threatening.  Patient presents from her group home with evidence of sepsis from UTI requiring medical admission.  Tachycardic and leukocytosis is SIRS criteria and urologic source.  Sent for a culture.  Blood work with leukocytosis to 22,000.  Mild not anion gap metabolic acidosis.  Normal electrolytes.  I consult with medicine for admission.  CT as above  Clinical Course as of 10/09/22 0347  Wynelle Link Oct 08, 2022  2344 Went to evaluate the patient for her joint pain that she  presented with, she has apparently just in the past hour or 2 after being here had multiple episodes of emesis and diarrhea.  She reports lower abdominal discomfort and has multiple episodes of emesis during my evaluation.  Update the nurses regarding medical workup. [DS]    Clinical Course User Index [DS] Delton Prairie, MD     FINAL CLINICAL IMPRESSION(S) / ED DIAGNOSES   Final diagnoses:  Sepsis without acute organ dysfunction, due to unspecified organism [facility])  Acute cystitis without hematuria     Rx / DC Orders   ED Discharge Orders     None        Note:  This document was prepared using Dragon voice recognition software and may include unintentional dictation errors.   Delton Prairie, MD 10/09/22 4098    Delton Prairie, MD 10/09/22 [phone]

## 2022-10-08 NOTE — Consult Note (Signed)
[facility] Face-to-Face Psychiatry Consult   Reason for Consult:  Psychiatric Eval Referring Physician:  Dr. Katrinka Blazing Patient Identification: Dawn Lambert MRN:  [id number] Principal Diagnosis: Stress reaction causing mixed disturbance of emotion and conduct Diagnosis:  Principal Problem:   Stress reaction causing mixed disturbance of emotion and conduct Active Problems:   Depression   Total Time spent with patient: 30 minutes  Subjective:   " Im in pain"  HPI:   Dawn Lambert, 32 y.o., female patient seen via tele health by this provider; chart reviewed and consulted with Dr. Katrinka Blazing on 10/08/22.  On evaluation Dawn Lambert reports  that she is in pain, but also that she feels as though her medication antidepressants needs to be adjusted.    Per triage note,  pt presents to ER via ems from [facility] with c/o BIL knee and ankle pain that pt states has been going on for years, but has become worse in the last few weeks. Pt denies any new injury to her lower extremities. Pt also reports that she feels like she needs some of her psych meds adjusted, as she feels like she has been having increased amts of unusual behaviors. Pt denies any SI/HI/AVH at this time, and states she just wishes to speak with the psych team. Pt otherwise A&O x4 and in NAD on arrival.    She states that she sees a psychiatrist at her group home once per month.  She has been advised to follow-up with her outside provider to make adjustments and educated her on the importance of keeping her provider updated on the effectiveness of her medications. She verbalized understanding.    During evaluation Dawn Lambert is laying on the bed; she is alert/oriented x 4; calm/cooperative; appears to be in distress and has asked for pain medication. Her mood  depressed with flat affect.  Patient is speaking in a clear tone at moderate volume, and normal pace; with good eye contact. Her thought process is coherent and relevant;  There is no indication that she is currently responding to internal/external stimuli or experiencing delusional thought content.  Patient denies suicidal/self-harm/homicidal ideation, psychosis, and paranoia.  Patient has remained calm throughout assessment and has answered questions appropriately.    Disposition: Patient psychiatrically cleared   Past Psychiatric History: IDD, depression   Risk to Self:   Risk to Others:   Prior Inpatient Therapy:   Prior Outpatient Therapy:    Past Medical History:  Past Medical History:  Diagnosis Date   Anxiety    Arnold-Chiari malformation (HCC)    Atrial septal defect    Complication of anesthesia    when pt was 11, woke up during surgery (muscle biopsy)   Constipation    Contraception management    Dandruff    Dermatomyositis (HCC)    age 12   Diabetes mellitus without complication (HCC)    Elevated blood pressure    Hypertension    Hypothyroidism    Mental retardation    Mood disorder (HCC)    Overweight(278.02)    Pelvic pain    Prediabetes     Past Surgical History:  Procedure Laterality Date   ASD REPAIR     at age 82   UMBILICAL HERNIA REPAIR N/A 09/17/2017   Procedure: HERNIA REPAIR UMBILICAL ADULT;  Surgeon: Earline Mayotte, MD;  Location: [facility] ORS;  Service: General;  Laterality: N/A;   Family History: History reviewed. No pertinent family history. Family Psychiatric  History:  Social  History:  Social History   Substance and Sexual Activity  Alcohol Use No     Social History   Substance and Sexual Activity  Drug Use No    Social History   Socioeconomic History   Marital status: Single    Spouse name: Not on file   Number of children: Not on file   Years of education: Not on file   Highest education level: Not on file  Occupational History   Not on file  Tobacco Use   Smoking status: Never   Smokeless tobacco: Never  Vaping Use   Vaping Use: Never used  Substance and Sexual Activity   Alcohol use:  No   Drug use: No   Sexual activity: Not on file  Other Topics Concern   Not on file  Social History Narrative   Not on file   Social Determinants of Health   Financial Resource Strain: Not on file  Food Insecurity: Not on file  Transportation Needs: Not on file  Physical Activity: Not on file  Stress: Not on file  Social Connections: Not on file   Additional Social History:    Allergies:   Allergies  Allergen Reactions   Amoxicillin-Pot Clavulanate Other (See Comments)    Has patient had a PCN reaction causing immediate rash, facial/tongue/throat swelling, SOB or lightheadedness with hypotension: ______  Has patient had a PCN reaction causing severe rash involving mucus membranes or skin necrosis: _______  Has patient had a PCN reaction that required hospitalization: _______  Has patient had a PCN reaction occurring within the last 10 years:_______  If all of the above answers are "NO", then may proceed with Cephalosporin use.  Other Reaction(s): Unknown  Other reaction(s): Other (See Comments)  Has patient had a PCN reaction causing immediate rash, facial/tongue/throat swelling, SOB or lightheadedness with hypotension: ______  Has patient had a PCN reaction causing severe rash involving mucus membranes or skin necrosis: _______  Has patient had a PCN reaction that required hospitalization: _______  Has patient had a PCN reaction occurring within the last 10 years:_______  If all of the above answers are "NO", then may proceed with Cephalosporin use.    Labs: No results found for this or any previous visit (from the past 48 hour(s)).  Current Facility-Administered Medications  Medication Dose Route Frequency Provider Last Rate Last Admin   lactated ringers bolus 1,000 mL  1,000 mL Intravenous Once Delton Prairie, MD       ondansetron [facility]) injection 4 mg  4 mg Intravenous Once Delton Prairie, MD       Current Outpatient Medications  Medication Sig Dispense Refill    acetaminophen (TYLENOL) 325 MG tablet Take 650 mg by mouth every 6 (six) hours as needed for mild pain or moderate pain.     albuterol (VENTOLIN HFA) 108 (90 Base) MCG/ACT inhaler Inhale 2 puffs into the lungs every 4 (four) hours as needed.     atorvastatin (LIPITOR) 10 MG tablet Take 10 mg by mouth at bedtime.     carbamazepine (TEGRETOL) 200 MG tablet Take 400 mg by mouth 2 (two) times daily.     cholecalciferol (VITAMIN D) 1000 units tablet Take 1,000 Units by mouth daily.      clonazePAM (KLONOPIN) 1 MG tablet Take 1 mg by mouth 2 (two) times daily. 2 mg this morn     Fluocinolone Acetonide Scalp 0.01 % OIL Apply to aa's scalp QD on Monday, Wednesday and Friday x 6 weeks. After 6 wks  decrease to QW on Friday. May increase to QD on Monday, Wednesday, and Friday during times of flares. 118.28 mL 11   gabapentin (NEURONTIN) 800 MG tablet Take 800 mg by mouth 3 (three) times daily.     guaifenesin (ROBITUSSIN) 100 MG/5ML syrup Take 200 mg by mouth 4 (four) times daily as needed for cough.     levothyroxine (SYNTHROID, LEVOTHROID) 175 MCG tablet Take 175 mcg by mouth daily before breakfast.     lisinopril (PRINIVIL,ZESTRIL) 20 MG tablet Take 20 mg by mouth at bedtime.     lithium carbonate 300 MG capsule Take 300-600 mg by mouth 2 (two) times daily with a meal. 300 mg qam and 600 mg qhs     LORazepam ER (LOREEV XR) 2 MG CS24 Take 2 mg by mouth in the morning.     metFORMIN (GLUCOPHAGE) 500 MG tablet Take 1,000 mg by mouth 2 (two) times daily.     Multiple Vitamin (THEREMS PO) Take 1 tablet by mouth daily.     propranolol (INDERAL) 40 MG tablet Take 40 mg by mouth 2 (two) times daily.     QUEtiapine (SEROQUEL XR) 300 MG 24 hr tablet Take by mouth.     QUEtiapine (SEROQUEL XR) 400 MG 24 hr tablet Take 400 mg by mouth at bedtime.     QUEtiapine (SEROQUEL) 50 MG tablet Take 50 mg by mouth at bedtime.      Musculoskeletal: Strength & Muscle Tone: within normal limits Gait & Station:  normal Patient leans: N/A  Pychiatric Specialty Exam:  Presentation  General Appearance:  Appropriate for Environment  Eye Contact: Fair  Speech: Clear and Coherent  Speech Volume: Decreased  Handedness: Right   Mood and Affect  Mood: Depressed  Affect: Congruent; Flat   Thought Process  Thought Processes: Coherent  Descriptions of Associations:Intact  Orientation:Full (Time, Place and Person)  Thought Content:WDL; Illogical  History of Schizophrenia/Schizoaffective disorder:No  Duration of Psychotic Symptoms:No data recorded Hallucinations:Hallucinations: None  Ideas of Reference:None  Suicidal Thoughts:Suicidal Thoughts: No  Homicidal Thoughts:Homicidal Thoughts: No   Sensorium  Memory: Recent Fair; Immediate Fair  Judgment: Fair  Insight: Fair   Art therapist  Concentration: Fair  Attention Span: Fair  Recall: [employer] of Knowledge: Fair  Language: Fair   Psychomotor Activity  Psychomotor Activity:Psychomotor Activity: Normal   Assets  Assets: Communication Skills; Desire for Improvement; Financial Resources/Insurance; Housing; Resilience; Social Support   Sleep  Sleep:Sleep: Fair   Physical Exam: Physical Exam Vitals and nursing note reviewed.  Constitutional:      General: She is in acute distress.     Appearance: She is ill-appearing.  HENT:     Head: Normocephalic and atraumatic.     Nose: Nose normal.     Mouth/Throat:     Mouth: Mucous membranes are dry.  Eyes:     Pupils: Pupils are equal, round, and reactive to light.  Pulmonary:     Effort: Pulmonary effort is normal.  Musculoskeletal:        General: Normal range of motion.     Cervical back: Normal range of motion.  Skin:    General: Skin is dry.  Neurological:     Mental Status: She is alert and oriented to person, place, and time.  Psychiatric:        Attention and Perception: Attention and perception normal.        Mood and  Affect: Mood is depressed.        Speech: Speech normal.  Behavior: Behavior is cooperative.        Thought Content: Thought content normal. Thought content does not include suicidal ideation. Thought content does not include suicidal plan.        Cognition and Memory: Cognition is impaired.        Judgment: Judgment is impulsive.    Review of Systems  Psychiatric/Behavioral:  Positive for depression. Negative for hallucinations, memory loss, substance abuse and suicidal ideas. The patient is not nervous/anxious and does not have insomnia.   All other systems reviewed and are negative.  Blood pressure 137/87, pulse (!) 104, temperature 98 F (36.7 C), temperature source Oral, resp. rate 18, weight 131.5 kg, SpO2 98 %. Body mass index is 53.04 kg/m.  Treatment Plan Summary:   Disposition: No evidence of imminent risk to self or others at present.   Patient does not meet criteria for psychiatric inpatient admission. Discussed crisis plan, support from social network, calling 911, coming to the Emergency Department, and calling Suicide Hotline.  Jearld Lesch, NP 10/08/2022 11:45 PM

## 2022-10-08 NOTE — ED Notes (Addendum)
Pt continues to vomit and had episode of liquid stool. Pts bedding and pts clothing changed for the second time.

## 2022-10-08 NOTE — ED Triage Notes (Signed)
Pt presents to ER via ems from [facility] with c/o BIL knee and ankle pain that pt states has been going on for years, but has become worse in the last few weeks.  Pt denies any new injury to her lower extremities.  Pt also reports that she feels like she needs some of her psych meds adjusted, as she feels like she has been having increased amts of unusual behaviors.  Pt denies any SI/HI/AVH at this time, and states she just wishes to speak with the psych team.  Pt otherwise A&O x4 and in NAD on arrival.

## 2022-10-09 ENCOUNTER — Ambulatory Visit: Payer: 59 | Admitting: Internal Medicine

## 2022-10-09 ENCOUNTER — Emergency Department: Payer: 59

## 2022-10-09 DIAGNOSIS — G8929 Other chronic pain: Secondary | ICD-10-CM | POA: Diagnosis present

## 2022-10-09 DIAGNOSIS — K529 Noninfective gastroenteritis and colitis, unspecified: Secondary | ICD-10-CM

## 2022-10-09 DIAGNOSIS — Z6841 Body Mass Index (BMI) 40.0 and over, adult: Secondary | ICD-10-CM | POA: Diagnosis not present

## 2022-10-09 DIAGNOSIS — F439 Reaction to severe stress, unspecified: Secondary | ICD-10-CM | POA: Diagnosis present

## 2022-10-09 DIAGNOSIS — E039 Hypothyroidism, unspecified: Secondary | ICD-10-CM | POA: Diagnosis present

## 2022-10-09 DIAGNOSIS — N390 Urinary tract infection, site not specified: Secondary | ICD-10-CM

## 2022-10-09 DIAGNOSIS — N179 Acute kidney failure, unspecified: Secondary | ICD-10-CM | POA: Diagnosis present

## 2022-10-09 DIAGNOSIS — E1165 Type 2 diabetes mellitus with hyperglycemia: Secondary | ICD-10-CM

## 2022-10-09 DIAGNOSIS — Z7984 Long term (current) use of oral hypoglycemic drugs: Secondary | ICD-10-CM | POA: Diagnosis not present

## 2022-10-09 DIAGNOSIS — N3000 Acute cystitis without hematuria: Secondary | ICD-10-CM

## 2022-10-09 DIAGNOSIS — Q0700 Arnold-Chiari syndrome without spina bifida or hydrocephalus: Secondary | ICD-10-CM | POA: Diagnosis not present

## 2022-10-09 DIAGNOSIS — Z1152 Encounter for screening for COVID-19: Secondary | ICD-10-CM | POA: Diagnosis not present

## 2022-10-09 DIAGNOSIS — Z79899 Other long term (current) drug therapy: Secondary | ICD-10-CM | POA: Diagnosis not present

## 2022-10-09 DIAGNOSIS — F32A Depression, unspecified: Secondary | ICD-10-CM | POA: Diagnosis present

## 2022-10-09 DIAGNOSIS — I10 Essential (primary) hypertension: Secondary | ICD-10-CM | POA: Insufficient documentation

## 2022-10-09 DIAGNOSIS — Z79890 Hormone replacement therapy (postmenopausal): Secondary | ICD-10-CM | POA: Diagnosis not present

## 2022-10-09 DIAGNOSIS — F79 Unspecified intellectual disabilities: Secondary | ICD-10-CM | POA: Diagnosis present

## 2022-10-09 DIAGNOSIS — E66813 Obesity, class 3: Secondary | ICD-10-CM

## 2022-10-09 DIAGNOSIS — Z880 Allergy status to penicillin: Secondary | ICD-10-CM | POA: Diagnosis not present

## 2022-10-09 DIAGNOSIS — M2550 Pain in unspecified joint: Secondary | ICD-10-CM | POA: Insufficient documentation

## 2022-10-09 DIAGNOSIS — M25562 Pain in left knee: Secondary | ICD-10-CM | POA: Diagnosis present

## 2022-10-09 DIAGNOSIS — M549 Dorsalgia, unspecified: Secondary | ICD-10-CM | POA: Diagnosis present

## 2022-10-09 DIAGNOSIS — M25561 Pain in right knee: Secondary | ICD-10-CM | POA: Diagnosis present

## 2022-10-09 DIAGNOSIS — A419 Sepsis, unspecified organism: Secondary | ICD-10-CM | POA: Diagnosis not present

## 2022-10-09 LAB — CBC WITH DIFFERENTIAL/PLATELET
Abs Immature Granulocytes: 0.1 10*3/uL — ABNORMAL HIGH (ref 0.00–0.07)
Basophils Absolute: 0.1 10*3/uL (ref 0.0–0.1)
Basophils Relative: 0 %
Eosinophils Absolute: 0.3 10*3/uL (ref 0.0–0.5)
Eosinophils Relative: 1 %
HCT: 38.7 % (ref 36.0–46.0)
Hemoglobin: 11.9 g/dL — ABNORMAL LOW (ref 12.0–15.0)
Immature Granulocytes: 0 %
Lymphocytes Relative: 3 %
Lymphs Abs: 0.7 10*3/uL (ref 0.7–4.0)
MCH: 28.8 pg (ref 26.0–34.0)
MCHC: 30.7 g/dL (ref 30.0–36.0)
MCV: 93.7 fL (ref 80.0–100.0)
Monocytes Absolute: 1.1 10*3/uL — ABNORMAL HIGH (ref 0.1–1.0)
Monocytes Relative: 5 %
Neutro Abs: 20.4 10*3/uL — ABNORMAL HIGH (ref 1.7–7.7)
Neutrophils Relative %: 91 %
Platelets: 299 10*3/uL (ref 150–400)
RBC: 4.13 MIL/uL (ref 3.87–5.11)
RDW: 13.8 % (ref 11.5–15.5)
WBC: 22.6 10*3/uL — ABNORMAL HIGH (ref 4.0–10.5)
nRBC: 0 % (ref 0.0–0.2)

## 2022-10-09 LAB — LITHIUM LEVEL: Lithium Lvl: 0.51 mmol/L — ABNORMAL LOW (ref 0.60–1.20)

## 2022-10-09 LAB — GASTROINTESTINAL PANEL BY PCR, STOOL (REPLACES STOOL CULTURE)

## 2022-10-09 LAB — HEMOGLOBIN A1C
Hgb A1c MFr Bld: 6.3 % — ABNORMAL HIGH (ref 4.8–5.6)
Mean Plasma Glucose: 134.11 mg/dL

## 2022-10-09 LAB — COMPREHENSIVE METABOLIC PANEL
ALT: 19 U/L (ref 0–44)
AST: 24 U/L (ref 15–41)
Albumin: 4.2 g/dL (ref 3.5–5.0)
Alkaline Phosphatase: 78 U/L (ref 38–126)
Anion gap: 14 (ref 5–15)
BUN: 21 mg/dL — ABNORMAL HIGH (ref 6–20)
CO2: 19 mmol/L — ABNORMAL LOW (ref 22–32)
Calcium: 9 mg/dL (ref 8.9–10.3)
Chloride: 103 mmol/L (ref 98–111)
Creatinine, Ser: 1.15 mg/dL — ABNORMAL HIGH (ref 0.44–1.00)
GFR, Estimated: >60 mL/min (ref >60)
Glucose, Bld: 221 mg/dL — ABNORMAL HIGH (ref 70–99)
Potassium: 3.7 mmol/L (ref 3.5–5.1)
Sodium: 136 mmol/L (ref 135–145)
Total Bilirubin: 0.6 mg/dL (ref 0.3–1.2)
Total Protein: 8.1 g/dL (ref 6.5–8.1)

## 2022-10-09 LAB — SALICYLATE LEVEL: Salicylate Lvl: <7 mg/dL — ABNORMAL LOW (ref 7.0–30.0)

## 2022-10-09 LAB — CBG MONITORING, ED
Glucose-Capillary: 202 mg/dL — ABNORMAL HIGH (ref 70–99)
Glucose-Capillary: 225 mg/dL — ABNORMAL HIGH (ref 70–99)
Glucose-Capillary: 134 mg/dL — ABNORMAL HIGH (ref 70–99)
Glucose-Capillary: 130 mg/dL — ABNORMAL HIGH (ref 70–99)

## 2022-10-09 LAB — URINE DRUG SCREEN, QUALITATIVE (ARMC ONLY)
Amphetamines, Ur Screen: NOT DETECTED
Barbiturates, Ur Screen: NOT DETECTED
Benzodiazepine, Ur Scrn: POSITIVE — AB
Cannabinoid 50 Ng, Ur ~~LOC~~: NOT DETECTED
Cocaine Metabolite,Ur ~~LOC~~: NOT DETECTED
MDMA (Ecstasy)Ur Screen: NOT DETECTED
Methadone Scn, Ur: NOT DETECTED
Opiate, Ur Screen: NOT DETECTED
Phencyclidine (PCP) Ur S: NOT DETECTED
Tricyclic, Ur Screen: POSITIVE — AB

## 2022-10-09 LAB — CREATININE, SERUM
Creatinine, Ser: 1.1 mg/dL — ABNORMAL HIGH (ref 0.44–1.00)
GFR, Estimated: >60 mL/min (ref >60)

## 2022-10-09 LAB — URINALYSIS, ROUTINE W REFLEX MICROSCOPIC
Bilirubin Urine: NEGATIVE
Glucose, UA: NEGATIVE mg/dL
Ketones, ur: NEGATIVE mg/dL
Nitrite: NEGATIVE
Protein, ur: 30 mg/dL — AB
Specific Gravity, Urine: 1.02 (ref 1.005–1.030)
WBC, UA: >50 WBC/hpf (ref 0–5)
pH: 5 (ref 5.0–8.0)

## 2022-10-09 LAB — CBC
HCT: 34 % — ABNORMAL LOW (ref 36.0–46.0)
Hemoglobin: 10.6 g/dL — ABNORMAL LOW (ref 12.0–15.0)
MCH: 29.3 pg (ref 26.0–34.0)
MCHC: 31.2 g/dL (ref 30.0–36.0)
MCV: 93.9 fL (ref 80.0–100.0)
Platelets: 231 10*3/uL (ref 150–400)
RBC: 3.62 MIL/uL — ABNORMAL LOW (ref 3.87–5.11)
RDW: 13.7 % (ref 11.5–15.5)
WBC: 15.1 10*3/uL — ABNORMAL HIGH (ref 4.0–10.5)
nRBC: 0 % (ref 0.0–0.2)

## 2022-10-09 LAB — ETHANOL: Alcohol, Ethyl (B): <10 mg/dL (ref <10)

## 2022-10-09 LAB — C DIFFICILE QUICK SCREEN W PCR REFLEX
C Diff antigen: NEGATIVE
C Diff interpretation: NOT DETECTED
C Diff toxin: NEGATIVE

## 2022-10-09 LAB — GLUCOSE, CAPILLARY: Glucose-Capillary: 131 mg/dL — ABNORMAL HIGH (ref 70–99)

## 2022-10-09 LAB — HIV ANTIBODY (ROUTINE TESTING W REFLEX): HIV Screen 4th Generation wRfx: NONREACTIVE

## 2022-10-09 LAB — HCG, QUANTITATIVE, PREGNANCY: hCG, Beta Chain, Quant, S: <1 m[IU]/mL (ref <5)

## 2022-10-09 LAB — SARS CORONAVIRUS 2 BY RT PCR: SARS Coronavirus 2 by RT PCR: NEGATIVE

## 2022-10-09 MED ORDER — LITHIUM CARBONATE 300 MG PO CAPS
300.0000 mg | ORAL_CAPSULE | Freq: Every day | ORAL | Status: DC
  Administered 2022-10-10: 300 mg via ORAL
  Filled 2022-10-09 (×3): qty 1

## 2022-10-09 MED ORDER — QUETIAPINE FUMARATE 25 MG PO TABS
50.0000 mg | ORAL_TABLET | Freq: Every day | ORAL | Status: DC
  Administered 2022-10-09: 50 mg via ORAL
  Filled 2022-10-09: qty 2

## 2022-10-09 MED ORDER — SODIUM CHLORIDE 0.9 % IV SOLN
1.0000 g | INTRAVENOUS | Status: DC
  Administered 2022-10-10: 1 g via INTRAVENOUS
  Filled 2022-10-09 (×2): qty 10

## 2022-10-09 MED ORDER — INSULIN ASPART 100 UNIT/ML IJ SOLN
0.0000 [IU] | Freq: Three times a day (TID) | INTRAMUSCULAR | Status: DC
  Administered 2022-10-09 – 2022-10-10 (×4): 2 [IU] via SUBCUTANEOUS
  Filled 2022-10-09 (×4): qty 1

## 2022-10-09 MED ORDER — ONDANSETRON HCL 4 MG/2ML IJ SOLN
4.0000 mg | Freq: Four times a day (QID) | INTRAMUSCULAR | Status: DC | PRN
  Administered 2022-10-09: 4 mg via INTRAVENOUS
  Filled 2022-10-09: qty 2

## 2022-10-09 MED ORDER — ACETAMINOPHEN 325 MG PO TABS: 650.0000 mg | ORAL_TABLET | Freq: Four times a day (QID) | ORAL | Status: DC | PRN

## 2022-10-09 MED ORDER — QUETIAPINE FUMARATE ER 200 MG PO TB24
400.0000 mg | ORAL_TABLET | Freq: Every day | ORAL | Status: DC
  Administered 2022-10-09: 400 mg via ORAL
  Filled 2022-10-09 (×2): qty 2

## 2022-10-09 MED ORDER — LITHIUM CARBONATE 300 MG PO CAPS
600.0000 mg | ORAL_CAPSULE | Freq: Every day | ORAL | Status: DC
  Administered 2022-10-09: 600 mg via ORAL
  Filled 2022-10-09 (×2): qty 2

## 2022-10-09 MED ORDER — IOHEXOL 300 MG/ML  SOLN
100.0000 mL | Freq: Once | INTRAMUSCULAR | Status: AC | PRN
  Administered 2022-10-09: 100 mL via INTRAVENOUS

## 2022-10-09 MED ORDER — ONDANSETRON HCL 4 MG PO TABS: 4.0000 mg | ORAL_TABLET | Freq: Four times a day (QID) | ORAL | Status: DC | PRN

## 2022-10-09 MED ORDER — ENOXAPARIN SODIUM 80 MG/0.8ML IJ SOSY
0.5000 mg/kg | PREFILLED_SYRINGE | INTRAMUSCULAR | Status: DC
  Administered 2022-10-09 – 2022-10-10 (×2): 65 mg via SUBCUTANEOUS
  Filled 2022-10-09 (×2): qty 0.65

## 2022-10-09 MED ORDER — LORAZEPAM ER 2 MG PO CS24: 2.0000 mg | EXTENDED_RELEASE_CAPSULE | Freq: Every morning | ORAL | Status: DC

## 2022-10-09 MED ORDER — INSULIN ASPART 100 UNIT/ML IJ SOLN: 0.0000 [IU] | Freq: Every day | INTRAMUSCULAR | Status: DC

## 2022-10-09 MED ORDER — LEVOTHYROXINE SODIUM 50 MCG PO TABS
175.0000 ug | ORAL_TABLET | Freq: Every day | ORAL | Status: DC
  Administered 2022-10-09 – 2022-10-10 (×2): 175 ug via ORAL
  Filled 2022-10-09: qty 2
  Filled 2022-10-09: qty 4

## 2022-10-09 MED ORDER — SODIUM CHLORIDE 0.9 % IV SOLN: INTRAVENOUS | Status: DC

## 2022-10-09 MED ORDER — INSULIN ASPART 100 UNIT/ML IJ SOLN
0.0000 [IU] | INTRAMUSCULAR | Status: DC
  Administered 2022-10-09 (×2): 7 [IU] via SUBCUTANEOUS
  Filled 2022-10-09 (×2): qty 1

## 2022-10-09 MED ORDER — PROPRANOLOL HCL 10 MG PO TABS
40.0000 mg | ORAL_TABLET | Freq: Two times a day (BID) | ORAL | Status: DC
  Administered 2022-10-09 – 2022-10-10 (×3): 40 mg via ORAL
  Filled 2022-10-09: qty 2
  Filled 2022-10-09 (×2): qty 4

## 2022-10-09 MED ORDER — SODIUM CHLORIDE 0.9 % IV SOLN
2.0000 g | Freq: Once | INTRAVENOUS | Status: AC
  Administered 2022-10-09: 2 g via INTRAVENOUS
  Filled 2022-10-09: qty 20

## 2022-10-09 MED ORDER — CARBAMAZEPINE 200 MG PO TABS
400.0000 mg | ORAL_TABLET | Freq: Two times a day (BID) | ORAL | Status: DC
  Administered 2022-10-09 – 2022-10-10 (×3): 400 mg via ORAL
  Filled 2022-10-09 (×4): qty 2

## 2022-10-09 MED ORDER — GABAPENTIN 400 MG PO CAPS
800.0000 mg | ORAL_CAPSULE | Freq: Three times a day (TID) | ORAL | Status: DC
  Administered 2022-10-09 – 2022-10-10 (×5): 800 mg via ORAL
  Filled 2022-10-09: qty 2
  Filled 2022-10-09: qty 8
  Filled 2022-10-09 (×4): qty 2
  Filled 2022-10-09: qty 8

## 2022-10-09 MED ORDER — ACETAMINOPHEN 650 MG RE SUPP: 650.0000 mg | Freq: Four times a day (QID) | RECTAL | Status: DC | PRN

## 2022-10-09 MED ORDER — CLONAZEPAM 1 MG PO TABS
1.0000 mg | ORAL_TABLET | Freq: Two times a day (BID) | ORAL | Status: DC
  Administered 2022-10-09 – 2022-10-10 (×3): 1 mg via ORAL
  Filled 2022-10-09: qty 1
  Filled 2022-10-09: qty 2
  Filled 2022-10-09: qty 1

## 2022-10-09 NOTE — Assessment & Plan Note (Signed)
No prior history of diabetes Will get an A1c to screen for diabetes

## 2022-10-09 NOTE — Progress Notes (Unsigned)
Pt canceled her appointment due to illness and hospitalization.

## 2022-10-09 NOTE — Assessment & Plan Note (Signed)
Patient reports chronic joint pain Tylenol as needed

## 2022-10-09 NOTE — Progress Notes (Signed)
CODE SEPSIS - PHARMACY COMMUNICATION  **Broad Spectrum Antibiotics should be administered within 1 hour of Sepsis diagnosis**  Time Code Sepsis Called/Page Received: 6/10 @ 0346  Antibiotics Ordered: Ceftriaxone 2 gm   Time of 1st antibiotic administration: Ceftriaxone 2 gm IV X 1 on 6/10 @ 0406   Additional action taken by pharmacy:   If necessary, Name of Provider/Nurse Contacted:     Nandi Tonnesen D ,PharmD Clinical Pharmacist  10/09/2022  4:17 AM

## 2022-10-09 NOTE — Progress Notes (Addendum)
Interim progress note not for billing.  Admitted earlier this morning by my colleague. I've seen and examined patient, agree with h and p unless otherwise stipulated.   Presents from group home requesting psych med adjustment. Seen by behavioral health and cleared for discharge. Then developed nbnb emesis and nb diarrhea. Wbcs elevated, hemodynamically stable, ct abd/pelvis sig only for bladder wall thickening, ua does suggest possible infection. Started on ceftriaxone. On exam this morning patient says had another episode of emesis as well as diarrhea, this was flushed and no sample sent. I've spoken with patient and nursing about getting a sample of the diarrhea so we can test. Will add c diff to gi pathogen panel ordered by admitting provider. Lithium level is slightly sub-therapeutic so no concern now for lithium toxicity. Pharmacy med rec pending will plan to re-order home meds once that's complete. Urine and blood cultures are pending. Patient requesting something to drink, will d/c NPO orders and advance to clears.

## 2022-10-09 NOTE — Assessment & Plan Note (Signed)
CT abdomen and pelvis showing cystitis but otherwise nonacute GI panel as patient is group home resident Supportive care IV fluids, IV antiemetics, IV Protonix

## 2022-10-09 NOTE — Assessment & Plan Note (Signed)
Rocephin ?Follow cultures ?

## 2022-10-09 NOTE — Assessment & Plan Note (Addendum)
Creatinine 1.15 above baseline of 0.9, bicarb 19 IV hydration and monitor

## 2022-10-09 NOTE — ED Notes (Signed)
This nurse called Pts legal guardian, who is her mother, to update her on pt status and medical admission.

## 2022-10-09 NOTE — Assessment & Plan Note (Signed)
Continue levothyroxine. check TSH 

## 2022-10-09 NOTE — Inpatient Diabetes Management (Signed)
Inpatient Diabetes Program Recommendations  AACE/ADA: New Consensus Statement on Inpatient Glycemic Control (2015)  Target Ranges:  Prepandial:   less than 140 mg/dL      Peak postprandial:   less than 180 mg/dL (1-2 hours)      Critically ill patients:  140 - 180 mg/dL   Lab Results  Component Value Date   GLUCAP 225 (H) 10/09/2022   HGBA1C 6.5 (H) 10/18/2012    Review of Glycemic Control  Latest Reference Range & Units 10/09/22 05:48 10/09/22 07:57  Glucose-Capillary 70 - 99 mg/dL 409 (H) 811 (H)  (H): Data is abnormally high Diabetes history: Type 2 DM Outpatient Diabetes medications: Metformin 1000 mg BID (NT), Trulicity 3 mg qwk Current orders for Inpatient glycemic control: Novolog 0-15 units TID & HS  Inpatient Diabetes Program Recommendations:    Consider: -A1C -Changing correction to Q4H since on clear liquids.  Thanks, Lujean Rave, MSN, RNC-OB Diabetes Coordinator [phone] (8a-5p)

## 2022-10-09 NOTE — Sepsis Progress Note (Signed)
Following for sepsis monitoring ?

## 2022-10-09 NOTE — Assessment & Plan Note (Signed)
Overall complicating factor

## 2022-10-09 NOTE — Progress Notes (Signed)
Anticoagulation monitoring(Lovenox):  32 yo female ordered Lovenox 40 mg Q24h    Filed Weights   10/08/22 2154  Weight: 131.5 kg (290 lb)   BMI 53   Lab Results  Component Value Date   CREATININE 1.10 (H) 10/09/2022   CREATININE 1.15 (H) 10/09/2022   CREATININE 0.95 03/12/2022   Estimated Creatinine Clearance: 95 mL/min (A) (by C-G formula based on SCr of 1.1 mg/dL (H)). Hemoglobin & Hematocrit     Component Value Date/Time   HGB 10.6 (L) 10/09/2022 0440   HGB 11.6 (L) 10/25/2013 1838   HCT 34.0 (L) 10/09/2022 0440   HCT 34.9 (L) 10/25/2013 1838     Per Protocol for Patient with estCrcl > 30 ml/min and BMI > 30, will transition to Lovenox 65 mg Q24h.

## 2022-10-09 NOTE — Assessment & Plan Note (Signed)
Sliding scale insulin coverage.  Hold home metformin ?

## 2022-10-09 NOTE — Assessment & Plan Note (Addendum)
Depression Psychiatric condition Supportive care Patient resides in a group home Continue psych meds once tolerating (patient on multiple meds lithium, quetiapine... Pending med rec) Follow-up lithium level

## 2022-10-09 NOTE — H&P (Addendum)
History and Physical    Patient: Dawn Lambert:[id number] DOB: [birth year] DOA: 10/08/2022 DOS: the patient was seen and examined on 10/09/2022 PCP: [facility]  Patient coming from:  Group home  Chief Complaint:  Chief Complaint  Patient presents with   Joint Pain   Psychiatric Evaluation    HPI: Dawn Lambert is a 32 y.o. female with medical history significant for Intellectual disability, diabetes, hypertension, hypothyroidism.  Morbid obesity with BMI 50-59.9, who was sent from her group home with initial complaint of pain in the bilateral knees and ankles and wanting to have her psych meds adjusted.  She was evaluated by behavioral health who found her to be depressed but otherwise at no imminent risk to self or others.  She was cleared by psych however while in the ED she developed vomiting and diarrhea, thus prompting a full medical workup.  She had several episodes of emesis, nonbloody nonbilious and started having diarrhea.  She reports that she had none of the symptoms prior to coming to the emergency room.  She denies abdominal pain or fever and chills.  Her knee and ankle pain has been chronic for years. ED course and data review: Briefly tachycardic to 104 but with otherwise normal vitals.  Labs significant for WBC of 22,000.  Urinalysis with large leukocyte esterase and many bacteria.  CMP significant for glucose of 221  creatinine of 1.15 above baseline of 0.9 and bicarb of 19. CT abdomen and pelvis notable for cystitis as follows: IMPRESSION: 1. Circumferential bladder wall thickening, possibly related to underlying cystitis. Correlation with urinalysis and urine culture may be helpful for further evaluation. 2. No other acute intra-abdominal pathology identified.  Patient treated with Rocephin for UTI, Zofran and started on LR boluses. Hospitalist consulted for admission.   Review of Systems: As mentioned in the history of present illness.  All other systems reviewed and are negative.  Past Medical History:  Diagnosis Date   Anxiety    Arnold-Chiari malformation (HCC)    Atrial septal defect    Complication of anesthesia    when pt was 11, woke up during surgery (muscle biopsy)   Constipation    Contraception management    Dandruff    Dermatomyositis [facility])    age 47   Diabetes mellitus without complication (HCC)    Elevated blood pressure    Hypertension    Hypothyroidism    Mental retardation    Mood disorder (HCC)    Overweight(278.02)    Pelvic pain    Prediabetes    Past Surgical History:  Procedure Laterality Date   ASD REPAIR     at age 85   UMBILICAL HERNIA REPAIR N/A 09/17/2017   Procedure: HERNIA REPAIR UMBILICAL ADULT;  Surgeon: Earline Mayotte, MD;  Location: [facility] ORS;  Service: General;  Laterality: N/A;   Social History:  reports that she has never smoked. She has never used smokeless tobacco. She reports that she does not drink alcohol and does not use drugs.  Allergies  Allergen Reactions   Amoxicillin-Pot Clavulanate Other (See Comments)    Has patient had a PCN reaction causing immediate rash, facial/tongue/throat swelling, SOB or lightheadedness with hypotension: ______  Has patient had a PCN reaction causing severe rash involving mucus membranes or skin necrosis: _______  Has patient had a PCN reaction that required hospitalization: _______  Has patient had a PCN reaction occurring within the last 10 years:_______  If all of the above answers are "  NO", then may proceed with Cephalosporin use.  Other Reaction(s): Unknown  Other reaction(s): Other (See Comments)  Has patient had a PCN reaction causing immediate rash, facial/tongue/throat swelling, SOB or lightheadedness with hypotension: ______  Has patient had a PCN reaction causing severe rash involving mucus membranes or skin necrosis: _______  Has patient had a PCN reaction that required hospitalization: _______  Has patient had a  PCN reaction occurring within the last 10 years:_______  If all of the above answers are "NO", then may proceed with Cephalosporin use.    History reviewed. No pertinent family history.  Prior to Admission medications   Medication Sig Start Date End Date Taking? Authorizing Provider  acetaminophen (TYLENOL) 325 MG tablet Take 650 mg by mouth every 6 (six) hours as needed for mild pain or moderate pain.    [provider]  albuterol (VENTOLIN HFA) 108 (90 Base) MCG/ACT inhaler Inhale 2 puffs into the lungs every 4 (four) hours as needed. 05/03/21   [provider]  atorvastatin (LIPITOR) 10 MG tablet Take 10 mg by mouth at bedtime.    [provider]  carbamazepine (TEGRETOL) 200 MG tablet Take 400 mg by mouth 2 (two) times daily.    [provider]  cholecalciferol (VITAMIN D) 1000 units tablet Take 1,000 Units by mouth daily.     [provider]  clonazePAM (KLONOPIN) 1 MG tablet Take 1 mg by mouth 2 (two) times daily. 2 mg this morn    [provider]  Fluocinolone Acetonide Scalp 0.01 % OIL Apply to aa's scalp QD on Monday, Wednesday and Friday x 6 weeks. After 6 wks decrease to QW on Friday. May increase to QD on Monday, Wednesday, and Friday during times of flares. 08/24/22   Deirdre Evener, MD  gabapentin (NEURONTIN) 800 MG tablet Take 800 mg by mouth 3 (three) times daily. 10/24/21   [provider]  guaifenesin (ROBITUSSIN) 100 MG/5ML syrup Take 200 mg by mouth 4 (four) times daily as needed for cough.    [provider]  levothyroxine (SYNTHROID, LEVOTHROID) 175 MCG tablet Take 175 mcg by mouth daily before breakfast.    [provider]  lisinopril (PRINIVIL,ZESTRIL) 20 MG tablet Take 20 mg by mouth at bedtime.    [provider]  lithium carbonate 300 MG capsule Take 300-600 mg by mouth 2 (two) times daily with a meal. 300 mg qam and 600 mg qhs    [provider]  LORazepam ER (LOREEV XR) 2  MG CS24 Take 2 mg by mouth in the morning.    [provider]  metFORMIN (GLUCOPHAGE) 500 MG tablet Take 1,000 mg by mouth 2 (two) times daily.    [provider]  Multiple Vitamin (THEREMS PO) Take 1 tablet by mouth daily.    [provider]  propranolol (INDERAL) 40 MG tablet Take 40 mg by mouth 2 (two) times daily.    [provider]  QUEtiapine (SEROQUEL XR) 300 MG 24 hr tablet Take by mouth.    [provider]  QUEtiapine (SEROQUEL XR) 400 MG 24 hr tablet Take 400 mg by mouth at bedtime. 10/24/21   [provider]  QUEtiapine (SEROQUEL) 50 MG tablet Take 50 mg by mouth at bedtime. 10/24/21   [provider]  ziprasidone (GEODON) 20 MG capsule Take 1 capsule (20 mg total) by mouth once as needed (severe agitation). 09/19/20 10/22/20  Charm Rings, NP    Physical Exam: Vitals:   10/08/22 2148  10/08/22 2154 10/09/22 0154  BP: 137/87  121/72  Pulse: (!) 104  92  Resp: 18  18  Temp: 98 F (36.7 C)  98.8 F (37.1 C)  TempSrc: Oral  Oral  SpO2: 98%  99%  Weight:  131.5 kg    Physical Exam Vitals and nursing note reviewed.  Constitutional:      General: She is not in acute distress.    Appearance: She is morbidly obese.  HENT:     Head: Normocephalic and atraumatic.  Cardiovascular:     Rate and Rhythm: Normal rate and regular rhythm.     Heart sounds: Normal heart sounds.  Pulmonary:     Effort: Pulmonary effort is normal.     Breath sounds: Normal breath sounds.  Abdominal:     Palpations: Abdomen is soft.     Tenderness: There is no abdominal tenderness.  Neurological:     Mental Status: Mental status is at baseline.     Labs on Admission: I have personally reviewed following labs and imaging studies  CBC: Recent Labs  Lab 10/09/22 0029  WBC 22.6*  NEUTROABS 20.4*  HGB 11.9*  HCT 38.7  MCV 93.7  PLT 299   Basic Metabolic Panel: Recent Labs  Lab 10/09/22 0029  NA 136  K 3.7  CL 103  CO2 19*   GLUCOSE 221*  BUN 21*  CREATININE 1.15*  CALCIUM 9.0   GFR: CrCl cannot be calculated (Unknown ideal weight.). Liver Function Tests: Recent Labs  Lab 10/09/22 0029  AST 24  ALT 19  ALKPHOS 78  BILITOT 0.6  PROT 8.1  ALBUMIN 4.2   No results for input(s): "LIPASE", "AMYLASE" in the last 168 hours. No results for input(s): "AMMONIA" in the last 168 hours. Coagulation Profile: No results for input(s): "INR", "PROTIME" in the last 168 hours. Cardiac Enzymes: No results for input(s): "CKTOTAL", "CKMB", "CKMBINDEX", "TROPONINI" in the last 168 hours. BNP (last 3 results) No results for input(s): "PROBNP" in the last 8760 hours. HbA1C: No results for input(s): "HGBA1C" in the last 72 hours. CBG: No results for input(s): "GLUCAP" in the last 168 hours. Lipid Profile: No results for input(s): "CHOL", "HDL", "LDLCALC", "TRIG", "CHOLHDL", "LDLDIRECT" in the last 72 hours. Thyroid Function Tests: No results for input(s): "TSH", "T4TOTAL", "FREET4", "T3FREE", "THYROIDAB" in the last 72 hours. Anemia Panel: No results for input(s): "VITAMINB12", "FOLATE", "FERRITIN", "TIBC", "IRON", "RETICCTPCT" in the last 72 hours. Urine analysis:    Component Value Date/Time   COLORURINE AMBER (A) 10/09/2022 0029   APPEARANCEUR TURBID (A) 10/09/2022 0029   APPEARANCEUR Clear 07/10/2013 1629   LABSPEC 1.020 10/09/2022 0029   LABSPEC 1.013 07/10/2013 1629   PHURINE 5.0 10/09/2022 0029   GLUCOSEU NEGATIVE 10/09/2022 0029   GLUCOSEU Negative 07/10/2013 1629   HGBUR MODERATE (A) 10/09/2022 0029   BILIRUBINUR NEGATIVE 10/09/2022 0029   BILIRUBINUR Negative 07/10/2013 1629   KETONESUR NEGATIVE 10/09/2022 0029   PROTEINUR 30 (A) 10/09/2022 0029   NITRITE NEGATIVE 10/09/2022 0029   LEUKOCYTESUR LARGE (A) 10/09/2022 0029   LEUKOCYTESUR Negative 07/10/2013 1629    Radiological Exams on Admission: CT ABDOMEN PELVIS W CONTRAST  Result Date: 10/09/2022 CLINICAL DATA:  lower abd pain, emesis EXAM:  CT ABDOMEN AND PELVIS WITH CONTRAST TECHNIQUE: Multidetector CT imaging of the abdomen and pelvis was performed using the standard protocol following bolus administration of intravenous contrast. RADIATION DOSE REDUCTION: This exam was performed according to the departmental dose-optimization program which includes automated exposure control, adjustment of the mA  and/or kV according to patient size and/or use of iterative reconstruction technique. CONTRAST:  OMNIPAQUE IOHEXOL 300 MG/ML  SOLN COMPARISON:  None Available. FINDINGS: Lower chest: No acute abnormality. Hepatobiliary: No focal liver abnormality is seen. No gallstones, gallbladder wall thickening, or biliary dilatation. Pancreas: Unremarkable Spleen: Unremarkable Adrenals/Urinary Tract: The adrenal glands are unremarkable. The kidneys are normal. The bladder is decompressed. There is, however, superimposed circumferential bladder wall thickening and a superimposed diffuse infectious or inflammatory cystitis is difficult to exclude. No perivesicular inflammatory stranding or fluid collections are identified. Stomach/Bowel: Stomach is within normal limits. Appendix appears normal. No evidence of bowel wall thickening, distention, or inflammatory changes. Vascular/Lymphatic: No significant vascular findings are present. No enlarged abdominal or pelvic lymph nodes. Reproductive: Uterus and bilateral adnexa are unremarkable. Other: No abdominal wall hernia or abnormality. No abdominopelvic ascites. Musculoskeletal: No acute or significant osseous findings. IMPRESSION: 1. Circumferential bladder wall thickening, possibly related to underlying cystitis. Correlation with urinalysis and urine culture may be helpful for further evaluation. 2. No other acute intra-abdominal pathology identified. Electronically Signed   By: Helyn Numbers M.D.   On: 10/09/2022 03:35     Data Reviewed: Relevant notes from primary care and specialist visits, past discharge  summaries as available in EHR, including Care Everywhere. Prior diagnostic testing as pertinent to current admission diagnoses Updated medications and problem lists for reconciliation ED course, including vitals, labs, imaging, treatment and response to treatment Triage notes, nursing and pharmacy notes and ED provider's notes Notable results as noted in HPI   Assessment and Plan: Urinary tract infection Rocephin Follow cultures  Acute gastroenteritis CT abdomen and pelvis showing cystitis but otherwise nonacute GI panel as patient is group home resident Supportive care IV fluids, IV antiemetics, IV Protonix  Uncontrolled type 2 diabetes mellitus with hyperglycemia, without long-term current use of insulin (HCC) Sliding scale insulin coverage Hold home metformin  AKI (acute kidney injury) (HCC) Creatinine 1.15 above baseline of 0.9, bicarb 19 IV hydration and monitor  Joint pain Patient reports chronic joint pain Tylenol as needed  Hypothyroidism Continue levothyroxine Will check TSH  Hypertension Continue lisinopril and propranolol once tolerating orally  Morbid obesity with BMI of 50.0-59.9, adult (HCC) Overall complicating factor  Intellectual disability Depression Psychiatric condition Supportive care Patient resides in a group home Continue psych meds once tolerating (patient on multiple meds lithium, quetiapine... Pending med rec) Follow-up lithium level     DVT prophylaxis: Lovenox  Consults: none  Advance Care Planning:   Code Status: Prior   Family Communication: none  Disposition Plan: Back to previous home environment  Severity of Illness: The appropriate patient status for this patient is OBSERVATION. Observation status is judged to be reasonable and necessary in order to provide the required intensity of service to ensure the patient's safety. The patient's presenting symptoms, physical exam findings, and initial radiographic and laboratory  data in the context of their medical condition is felt to place them at decreased risk for further clinical deterioration. Furthermore, it is anticipated that the patient will be medically stable for discharge from the hospital within 2 midnights of admission.   Author: Andris Baumann, MD 10/09/2022 4:03 AM  For on call review [email or web address].

## 2022-10-09 NOTE — ED Notes (Signed)
ED TO INPATIENT HANDOFF REPORT  ED Nurse Name and Phone #: Thurmon Fair. Manson Passey, RN [phone]  S Name/Age/Gender Dawn Lambert 32 y.o. female Room/Bed: ED24A/ED24A  Code Status   Code Status: Full Code  Home/SNF/Other Group Home Patient oriented to: self, place, time, and situation Is this baseline? Yes   Triage Complete: Triage complete  Chief Complaint Acute gastroenteritis [K52.9]  Triage Note Pt presents to ER via ems from [facility] with c/o BIL knee and ankle pain that pt states has been going on for years, but has become worse in the last few weeks.  Pt denies any new injury to her lower extremities.  Pt also reports that she feels like she needs some of her psych meds adjusted, as she feels like she has been having increased amts of unusual behaviors.  Pt denies any SI/HI/AVH at this time, and states she just wishes to speak with the psych team.  Pt otherwise A&O x4 and in NAD on arrival.     Allergies Allergies  Allergen Reactions   Amoxicillin-Pot Clavulanate Other (See Comments)    Has patient had a PCN reaction causing immediate rash, facial/tongue/throat swelling, SOB or lightheadedness with hypotension: ______  Has patient had a PCN reaction causing severe rash involving mucus membranes or skin necrosis: _______  Has patient had a PCN reaction that required hospitalization: _______  Has patient had a PCN reaction occurring within the last 10 years:_______  If all of the above answers are "NO", then may proceed with Cephalosporin use.  Other Reaction(s): Unknown  Other reaction(s): Other (See Comments)  Has patient had a PCN reaction causing immediate rash, facial/tongue/throat swelling, SOB or lightheadedness with hypotension: ______  Has patient had a PCN reaction causing severe rash involving mucus membranes or skin necrosis: _______  Has patient had a PCN reaction that required hospitalization: _______  Has patient had a PCN reaction occurring within the  last 10 years:_______  If all of the above answers are "NO", then may proceed with Cephalosporin use.    Level of Care/Admitting Diagnosis ED Disposition     ED Disposition  Admit   Condition  --   Comment  Hospital Area: [facility] [[id number]]  Level of Care: Med-Surg [16]  Covid Evaluation: Asymptomatic - no recent exposure (last 10 days) testing not required  Diagnosis: Acute gastroenteritis [171312]  Admitting Physician: Andris Baumann [[id number]]  Attending Physician: Andris Baumann [[id number]]  Certification:: I certify this patient will need inpatient services for at least 2 midnights  Estimated Length of Stay: 2          B Medical/Surgery History Past Medical History:  Diagnosis Date   Anxiety    Arnold-Chiari malformation (HCC)    Atrial septal defect    Complication of anesthesia    when pt was 11, woke up during surgery (muscle biopsy)   Constipation    Contraception management    Dandruff    Dermatomyositis [facility])    age 21   Diabetes mellitus without complication (HCC)    Elevated blood pressure    Hypertension    Hypothyroidism    Mental retardation    Mood disorder (HCC)    Overweight(278.02)    Pelvic pain    Prediabetes    Past Surgical History:  Procedure Laterality Date   ASD REPAIR     at age 67   UMBILICAL HERNIA REPAIR N/A 09/17/2017   Procedure: HERNIA REPAIR UMBILICAL ADULT;  Surgeon: Earline Mayotte,  MD;  Location: [facility] ORS;  Service: General;  Laterality: N/A;     A IV Location/Drains/Wounds Patient Lines/Drains/Airways Status     Active Line/Drains/Airways     Name Placement date Placement time Site Days   Peripheral IV 10/09/22 20 G 1" Left Antecubital 10/09/22  0019  Antecubital  less than 1   Peripheral IV 10/09/22 20 G Right Forearm 10/09/22  0522  Forearm  less than 1            Intake/Output Last 24 hours  Intake/Output Summary (Last 24 hours) at 10/09/2022 1322 Last data filed at 10/09/2022  0508 Gross per 24 hour  Intake 1100.23 ml  Output --  Net 1100.23 ml    Labs/Imaging Results for orders placed or performed during the hospital encounter of 10/08/22 (from the past 48 hour(s))  Lithium level     Status: Abnormal   Collection Time: 10/09/22 12:27 AM  Result Value Ref Range   Lithium Lvl 0.51 (L) 0.60 - 1.20 mmol/L    Comment: Performed at [facility], [address] [place], [name] [address]  Urinalysis, Routine w reflex microscopic -Urine, Clean Catch     Status: Abnormal   Collection Time: 10/09/22 12:29 AM  Result Value Ref Range   Color, Urine AMBER (A) YELLOW    Comment: BIOCHEMICALS MAY BE AFFECTED BY COLOR   APPearance TURBID (A) CLEAR   Specific Gravity, Urine 1.020 1.005 - 1.030   pH 5.0 5.0 - 8.0   Glucose, UA NEGATIVE NEGATIVE mg/dL   Hgb urine dipstick MODERATE (A) NEGATIVE   Bilirubin Urine NEGATIVE NEGATIVE   Ketones, ur NEGATIVE NEGATIVE mg/dL   Protein, ur 30 (A) NEGATIVE mg/dL   Nitrite NEGATIVE NEGATIVE   Leukocytes,Ua LARGE (A) NEGATIVE   RBC / HPF 21-50 0 - 5 RBC/hpf   WBC, UA >50 0 - 5 WBC/hpf   Bacteria, UA MANY (A) NONE SEEN   Squamous Epithelial / HPF 21-50 0 - 5 /HPF   Mucus PRESENT    Hyaline Casts, UA PRESENT     Comment: Performed at [facility], [address] [place], [name] [address]  Urine Drug Screen, Qualitative ([facility] only)     Status: Abnormal   Collection Time: 10/09/22 12:29 AM  Result Value Ref Range   Tricyclic, Ur Screen POSITIVE (A) NONE DETECTED   Amphetamines, Ur Screen NONE DETECTED NONE DETECTED   MDMA (Ecstasy)Ur Screen NONE DETECTED NONE DETECTED   Cocaine Metabolite,Ur [place] NONE DETECTED NONE DETECTED   Opiate, Ur Screen NONE DETECTED NONE DETECTED   Phencyclidine (PCP) Ur S NONE DETECTED NONE DETECTED   Cannabinoid 50 Ng, Ur [place] NONE DETECTED NONE DETECTED   Barbiturates, Ur Screen NONE DETECTED NONE DETECTED   Benzodiazepine, Ur Scrn POSITIVE (A) NONE DETECTED   Methadone Scn, Ur  NONE DETECTED NONE DETECTED    Comment: (NOTE) Tricyclics + metabolites, urine    Cutoff 1000 ng/mL Amphetamines + metabolites, urine  Cutoff 1000 ng/mL MDMA (Ecstasy), urine              Cutoff 500 ng/mL Cocaine Metabolite, urine          Cutoff 300 ng/mL Opiate + metabolites, urine        Cutoff 300 ng/mL Phencyclidine (PCP), urine         Cutoff 25 ng/mL Cannabinoid, urine                 Cutoff 50 ng/mL Barbiturates + metabolites, urine  Cutoff 200  ng/mL Benzodiazepine, urine              Cutoff 200 ng/mL Methadone, urine                   Cutoff 300 ng/mL  The urine drug screen provides only a preliminary, unconfirmed analytical test result and should not be used for non-medical purposes. Clinical consideration and professional judgment should be applied to any positive drug screen result due to possible interfering substances. A more specific alternate chemical method must be used in order to obtain a confirmed analytical result. Gas chromatography / mass spectrometry (GC/MS) is the preferred confirm atory method. Performed at [facility], [address] [place], [name] [address]   CBC with Differential     Status: Abnormal   Collection Time: 10/09/22 12:29 AM  Result Value Ref Range   WBC 22.6 (H) 4.0 - 10.5 K/uL   RBC 4.13 3.87 - 5.11 MIL/uL   Hemoglobin 11.9 (L) 12.0 - 15.0 g/dL   HCT 60.4 54.0 - 98.1 %   MCV 93.7 80.0 - 100.0 fL   MCH 28.8 26.0 - 34.0 pg   MCHC 30.7 30.0 - 36.0 g/dL   RDW 19.1 47.8 - 29.5 %   Platelets 299 150 - 400 K/uL   nRBC 0.0 0.0 - 0.2 %   Neutrophils Relative % 91 %   Neutro Abs 20.4 (H) 1.7 - 7.7 K/uL   Lymphocytes Relative 3 %   Lymphs Abs 0.7 0.7 - 4.0 K/uL   Monocytes Relative 5 %   Monocytes Absolute 1.1 (H) 0.1 - 1.0 K/uL   Eosinophils Relative 1 %   Eosinophils Absolute 0.3 0.0 - 0.5 K/uL   Basophils Relative 0 %   Basophils Absolute 0.1 0.0 - 0.1 K/uL   Immature Granulocytes 0 %   Abs Immature Granulocytes 0.10 (H)  0.00 - 0.07 K/uL    Comment: Performed at [facility], [address] [place], [name] [address]  Comprehensive metabolic panel     Status: Abnormal   Collection Time: 10/09/22 12:29 AM  Result Value Ref Range   Sodium 136 135 - 145 mmol/L   Potassium 3.7 3.5 - 5.1 mmol/L   Chloride 103 98 - 111 mmol/L   CO2 19 (L) 22 - 32 mmol/L   Glucose, Bld 221 (H) 70 - 99 mg/dL    Comment: Glucose reference range applies only to samples taken after fasting for at least 8 hours.   BUN 21 (H) 6 - 20 mg/dL   Creatinine, Ser 8.65 (H) 0.44 - 1.00 mg/dL   Calcium 9.0 8.9 - 78.4 mg/dL   Total Protein 8.1 6.5 - 8.1 g/dL   Albumin 4.2 3.5 - 5.0 g/dL   AST 24 15 - 41 U/L   ALT 19 0 - 44 U/L   Alkaline Phosphatase 78 38 - 126 U/L   Total Bilirubin 0.6 0.3 - 1.2 mg/dL   GFR, Estimated >69 >62 mL/min    Comment: (NOTE) Calculated using the CKD-EPI Creatinine Equation (2021)    Anion gap 14 5 - 15    Comment: Performed at [facility], [address] [place], [name] [address]  Ethanol     Status: None   Collection Time: 10/09/22 12:29 AM  Result Value Ref Range   Alcohol, Ethyl (B) <10 <10 mg/dL    Comment: (NOTE) Lowest detectable limit for serum alcohol is 10 mg/dL.  For medical purposes only. Performed at [facility], [address] [place], [name] [address]   Salicylate level     Status: Abnormal   Collection Time: 10/09/22 12:29 AM  Result Value Ref Range   Salicylate Lvl <7.0 (L) 7.0 - 30.0 mg/dL    Comment: Performed at [facility], [address] [place], [name] [address]  hCG, quantitative, pregnancy     Status: None   Collection Time: 10/09/22 12:29 AM  Result Value Ref Range   hCG, Beta Chain, Quant, S <1 <5 mIU/mL    Comment:          GEST. AGE      CONC.  (mIU/mL)   <=1 WEEK        5 - 50     2 WEEKS       50 - 500     3 WEEKS       100 - 10,000     4 WEEKS     1,000 - 30,000     5 WEEKS     3,500 - 115,000   6-8 WEEKS      12,000 - 270,000    12 WEEKS     15,000 - 220,000        FEMALE AND NON-PREGNANT FEMALE:     LESS THAN 5 mIU/mL Performed at [facility], [address] [place], [name] [address]   Culture, blood (single)     Status: None (Preliminary result)   Collection Time: 10/09/22  4:40 AM   Specimen: BLOOD RIGHT ARM  Result Value Ref Range   Specimen Description BLOOD RIGHT ARM    Special Requests      BOTTLES DRAWN AEROBIC AND ANAEROBIC Blood Culture adequate volume   Culture      NO GROWTH < 12 HOURS Performed at [facility], [address] [place], [name] [address]    Report Status PENDING   CBC     Status: Abnormal   Collection Time: 10/09/22  4:40 AM  Result Value Ref Range   WBC 15.1 (H) 4.0 - 10.5 K/uL   RBC 3.62 (L) 3.87 - 5.11 MIL/uL   Hemoglobin 10.6 (L) 12.0 - 15.0 g/dL   HCT 29.5 (L) 62.1 - 30.8 %   MCV 93.9 80.0 - 100.0 fL   MCH 29.3 26.0 - 34.0 pg   MCHC 31.2 30.0 - 36.0 g/dL   RDW 65.7 84.6 - 96.2 %   Platelets 231 150 - 400 K/uL   nRBC 0.0 0.0 - 0.2 %    Comment: Performed at [facility], [address] [place], [name] [address]  Creatinine, serum     Status: Abnormal   Collection Time: 10/09/22  4:40 AM  Result Value Ref Range   Creatinine, Ser 1.10 (H) 0.44 - 1.00 mg/dL   GFR, Estimated >13 >24 mL/min    Comment: (NOTE) Calculated using the CKD-EPI Creatinine Equation (2021) Performed at [facility], [address] [place], [name] [address]   HIV Antibody (routine testing w rflx)     Status: None   Collection Time: 10/09/22  4:40 AM  Result Value Ref Range   HIV Screen 4th Generation wRfx Non Reactive Non Reactive    Comment: Performed at [facility], [address], [place], [name] [address]  Hemoglobin A1c     Status: Abnormal   Collection Time: 10/09/22  4:40 AM  Result Value Ref Range   Hgb A1c MFr Bld 6.3 (H) 4.8 - 5.6 %    Comment: (  NOTE) Pre diabetes:          5.7%-6.4%  Diabetes:               >6.4%  Glycemic control for   <7.0% adults with diabetes    Mean Plasma Glucose 134.11 mg/dL    Comment: Performed at [facility], [address] [place], [name] [address]  CBG monitoring, ED     Status: Abnormal   Collection Time: 10/09/22  5:48 AM  Result Value Ref Range   Glucose-Capillary 202 (H) 70 - 99 mg/dL    Comment: Glucose reference range applies only to samples taken after fasting for at least 8 hours.  CBG monitoring, ED     Status: Abnormal   Collection Time: 10/09/22  7:57 AM  Result Value Ref Range   Glucose-Capillary 225 (H) 70 - 99 mg/dL    Comment: Glucose reference range applies only to samples taken after fasting for at least 8 hours.  SARS Coronavirus 2 by RT PCR (hospital order, performed in [facility] hospital lab) *cepheid single result test* Anterior Nasal Swab     Status: None   Collection Time: 10/09/22  8:22 AM   Specimen: Anterior Nasal Swab  Result Value Ref Range   SARS Coronavirus 2 by RT PCR NEGATIVE NEGATIVE    Comment: (NOTE) SARS-CoV-2 target nucleic acids are NOT DETECTED.  The SARS-CoV-2 RNA is generally detectable in upper and lower respiratory specimens during the acute phase of infection. The lowest concentration of SARS-CoV-2 viral copies this assay can detect is 250 copies / mL. A negative result does not preclude SARS-CoV-2 infection and should not be used as the sole basis for treatment or other patient management decisions.  A negative result may occur with improper specimen collection / handling, submission of specimen other than nasopharyngeal swab, presence of viral mutation(s) within the areas targeted by this assay, and inadequate number of viral copies (<250 copies / mL). A negative result must be combined with clinical observations, patient history, and epidemiological information.  Fact Sheet for Patients:   RoadLapTop.co.za  Fact Sheet for Healthcare  Providers: http://kim-miller.com/  This test is not yet approved or  cleared by the [place] FDA and has been authorized for detection and/or diagnosis of SARS-CoV-2 by FDA under an Emergency Use Authorization (EUA).  This EUA will remain in effect (meaning this test can be used) for the duration of the COVID-19 declaration under Section 564(b)(1) of the Act, 21 U.S.C. section 360bbb-3(b)(1), unless the authorization is terminated or revoked sooner.  Performed at [facility], [address] [place], [name] [address]   Gastrointestinal Panel by PCR , Stool     Status: None   Collection Time: 10/09/22 10:52 AM   Specimen: Stool  Result Value Ref Range   Campylobacter species NOT DETECTED NOT DETECTED   Plesimonas shigelloides NOT DETECTED NOT DETECTED   Salmonella species NOT DETECTED NOT DETECTED   Yersinia enterocolitica NOT DETECTED NOT DETECTED   Vibrio species NOT DETECTED NOT DETECTED   Vibrio cholerae NOT DETECTED NOT DETECTED   Enteroaggregative E coli (EAEC) NOT DETECTED NOT DETECTED   Enteropathogenic E coli (EPEC) NOT DETECTED NOT DETECTED   Enterotoxigenic E coli (ETEC) NOT DETECTED NOT DETECTED   Shiga like toxin producing E coli (STEC) NOT DETECTED NOT DETECTED   Shigella/Enteroinvasive E coli (EIEC) NOT DETECTED NOT DETECTED   Cryptosporidium NOT DETECTED NOT DETECTED   Cyclospora cayetanensis NOT DETECTED NOT DETECTED   Entamoeba histolytica NOT  DETECTED NOT DETECTED   Giardia lamblia NOT DETECTED NOT DETECTED   Adenovirus F40/41 NOT DETECTED NOT DETECTED   Astrovirus NOT DETECTED NOT DETECTED   Norovirus GI/GII NOT DETECTED NOT DETECTED   Rotavirus A NOT DETECTED NOT DETECTED   Sapovirus (I, II, IV, and V) NOT DETECTED NOT DETECTED    Comment: Performed at [facility], [address], [place], [name] [address]  C Difficile Quick Screen w PCR reflex     Status: None   Collection Time: 10/09/22 10:52 AM   Specimen:  Stool  Result Value Ref Range   C Diff antigen NEGATIVE NEGATIVE   C Diff toxin NEGATIVE NEGATIVE   C Diff interpretation No C. difficile detected.     Comment: Performed at [facility], [address] [place], [name] [address]  CBG monitoring, ED     Status: Abnormal   Collection Time: 10/09/22 12:35 PM  Result Value Ref Range   Glucose-Capillary 134 (H) 70 - 99 mg/dL    Comment: Glucose reference range applies only to samples taken after fasting for at least 8 hours.   CT ABDOMEN PELVIS W CONTRAST  Result Date: 10/09/2022 CLINICAL DATA:  lower abd pain, emesis EXAM: CT ABDOMEN AND PELVIS WITH CONTRAST TECHNIQUE: Multidetector CT imaging of the abdomen and pelvis was performed using the standard protocol following bolus administration of intravenous contrast. RADIATION DOSE REDUCTION: This exam was performed according to the departmental dose-optimization program which includes automated exposure control, adjustment of the mA and/or kV according to patient size and/or use of iterative reconstruction technique. CONTRAST:  OMNIPAQUE IOHEXOL 300 MG/ML  SOLN COMPARISON:  None Available. FINDINGS: Lower chest: No acute abnormality. Hepatobiliary: No focal liver abnormality is seen. No gallstones, gallbladder wall thickening, or biliary dilatation. Pancreas: Unremarkable Spleen: Unremarkable Adrenals/Urinary Tract: The adrenal glands are unremarkable. The kidneys are normal. The bladder is decompressed. There is, however, superimposed circumferential bladder wall thickening and a superimposed diffuse infectious or inflammatory cystitis is difficult to exclude. No perivesicular inflammatory stranding or fluid collections are identified. Stomach/Bowel: Stomach is within normal limits. Appendix appears normal. No evidence of bowel wall thickening, distention, or inflammatory changes. Vascular/Lymphatic: No significant vascular findings are present. No enlarged abdominal or pelvic lymph  nodes. Reproductive: Uterus and bilateral adnexa are unremarkable. Other: No abdominal wall hernia or abnormality. No abdominopelvic ascites. Musculoskeletal: No acute or significant osseous findings. IMPRESSION: 1. Circumferential bladder wall thickening, possibly related to underlying cystitis. Correlation with urinalysis and urine culture may be helpful for further evaluation. 2. No other acute intra-abdominal pathology identified. Electronically Signed   By: Helyn Numbers M.D.   On: 10/09/2022 03:35    Pending Labs Unresulted Labs (From admission, onward)     Start     Ordered   10/16/22 0500  Creatinine, serum  (enoxaparin (LOVENOX)    CrCl >/= 30 ml/min)  Weekly,   TIMED     Comments: while on enoxaparin therapy    10/09/22 0420   10/10/22 0500  Basic metabolic panel  Daily,   R      10/09/22 0820   10/09/22 0346  Urine Culture  Add-on,   AD       Question:  Indication  Answer:  Suprapubic pain   10/09/22 0345            Vitals/Pain Today's Vitals   10/09/22 0959 10/09/22 1000 10/09/22 1100 10/09/22 1232  BP: (!) 142/97 (!) 150/86 (!) 152/89 122/81  Pulse: 87 88 87 79  Resp:  16 18 20   Temp:    98.2 F (36.8 C)  TempSrc:    Oral  SpO2: 100% 100% 100% 100%  Weight:      Height:      PainSc:        Isolation Precautions Enteric precautions (UV disinfection)  Medications Medications  cefTRIAXone (ROCEPHIN) 1 g in sodium chloride 0.9 % 100 mL IVPB (has no administration in time range)  enoxaparin (LOVENOX) injection 65 mg (65 mg Subcutaneous Given 10/09/22 0801)  acetaminophen (TYLENOL) tablet 650 mg (has no administration in time range)    Or  acetaminophen (TYLENOL) suppository 650 mg (has no administration in time range)  ondansetron (ZOFRAN) tablet 4 mg ( Oral See Alternative 10/09/22 1245)    Or  ondansetron (ZOFRAN) injection 4 mg (4 mg Intravenous Given 10/09/22 1245)  0.9 %  sodium chloride infusion ( Intravenous Rate/Dose Change 10/09/22 0819)  insulin  aspart (novoLOG) injection 0-15 Units (2 Units Subcutaneous Given 10/09/22 1242)  insulin aspart (novoLOG) injection 0-5 Units (has no administration in time range)  carbamazepine (TEGRETOL) tablet 400 mg (400 mg Oral Given 10/09/22 1046)  clonazePAM (KLONOPIN) tablet 1 mg (1 mg Oral Given 10/09/22 1057)  gabapentin (NEURONTIN) capsule 800 mg (800 mg Oral Given 10/09/22 1046)  levothyroxine (SYNTHROID) tablet 175 mcg (175 mcg Oral Given 10/09/22 1047)  propranolol (INDERAL) tablet 40 mg (40 mg Oral Given 10/09/22 1047)  QUEtiapine (SEROQUEL XR) 24 hr tablet 400 mg (has no administration in time range)  QUEtiapine (SEROQUEL) tablet 50 mg (has no administration in time range)  ondansetron (ZOFRAN-ODT) disintegrating tablet 4 mg (0 mg Oral See Procedure Record 10/08/22 2255)  lactated ringers bolus 1,000 mL (0 mLs Intravenous Stopped 10/09/22 0249)  ondansetron (ZOFRAN) injection 4 mg (4 mg Intravenous Given 10/09/22 0019)  ketorolac (TORADOL) 30 MG/ML injection 15 mg (15 mg Intravenous Given 10/09/22 0025)  iohexol (OMNIPAQUE) 300 MG/ML solution 100 mL (100 mLs Intravenous Contrast Given 10/09/22 0255)  cefTRIAXone (ROCEPHIN) 2 g in sodium chloride 0.9 % 100 mL IVPB (0 g Intravenous Stopped 10/09/22 0508)    Mobility walks     Focused Assessments Diarrhea, nausea   R Recommendations: See Admitting Provider Note  Report given to:   Additional Notes:

## 2022-10-09 NOTE — Assessment & Plan Note (Signed)
Continue lisinopril and propranolol once tolerating orally

## 2022-10-10 DIAGNOSIS — F79 Unspecified intellectual disabilities: Secondary | ICD-10-CM

## 2022-10-10 DIAGNOSIS — A419 Sepsis, unspecified organism: Secondary | ICD-10-CM

## 2022-10-10 DIAGNOSIS — E1165 Type 2 diabetes mellitus with hyperglycemia: Secondary | ICD-10-CM

## 2022-10-10 DIAGNOSIS — Z6841 Body Mass Index (BMI) 40.0 and over, adult: Secondary | ICD-10-CM

## 2022-10-10 DIAGNOSIS — N179 Acute kidney failure, unspecified: Secondary | ICD-10-CM

## 2022-10-10 DIAGNOSIS — K529 Noninfective gastroenteritis and colitis, unspecified: Principal | ICD-10-CM

## 2022-10-10 LAB — URINALYSIS, W/ REFLEX TO CULTURE (INFECTION SUSPECTED)
Bacteria, UA: NONE SEEN
Bilirubin Urine: NEGATIVE
Glucose, UA: NEGATIVE mg/dL
Ketones, ur: NEGATIVE mg/dL
Leukocytes,Ua: NEGATIVE
Nitrite: NEGATIVE
Protein, ur: NEGATIVE mg/dL
Specific Gravity, Urine: 1.006 (ref 1.005–1.030)
pH: 5 (ref 5.0–8.0)

## 2022-10-10 LAB — CBC
HCT: 30.2 % — ABNORMAL LOW (ref 36.0–46.0)
Hemoglobin: 9.4 g/dL — ABNORMAL LOW (ref 12.0–15.0)
MCH: 29.1 pg (ref 26.0–34.0)
MCHC: 31.1 g/dL (ref 30.0–36.0)
MCV: 93.5 fL (ref 80.0–100.0)
Platelets: 224 10*3/uL (ref 150–400)
RBC: 3.23 MIL/uL — ABNORMAL LOW (ref 3.87–5.11)
RDW: 13.7 % (ref 11.5–15.5)
WBC: 8.6 10*3/uL (ref 4.0–10.5)
nRBC: 0 % (ref 0.0–0.2)

## 2022-10-10 LAB — GLUCOSE, CAPILLARY
Glucose-Capillary: 150 mg/dL — ABNORMAL HIGH (ref 70–99)
Glucose-Capillary: 133 mg/dL — ABNORMAL HIGH (ref 70–99)
Glucose-Capillary: 118 mg/dL — ABNORMAL HIGH (ref 70–99)

## 2022-10-10 LAB — URINE CULTURE

## 2022-10-10 LAB — BASIC METABOLIC PANEL
Anion gap: 9 (ref 5–15)
BUN: 11 mg/dL (ref 6–20)
CO2: 22 mmol/L (ref 22–32)
Calcium: 7.9 mg/dL — ABNORMAL LOW (ref 8.9–10.3)
Chloride: 111 mmol/L (ref 98–111)
Creatinine, Ser: 0.81 mg/dL (ref 0.44–1.00)
GFR, Estimated: >60 mL/min (ref >60)
Glucose, Bld: 129 mg/dL — ABNORMAL HIGH (ref 70–99)
Potassium: 3.5 mmol/L (ref 3.5–5.1)
Sodium: 142 mmol/L (ref 135–145)

## 2022-10-10 LAB — CULTURE, BLOOD (SINGLE): Culture: NO GROWTH

## 2022-10-10 MED ORDER — CIPROFLOXACIN HCL 500 MG PO TABS: 500.0000 mg | ORAL_TABLET | Freq: Two times a day (BID) | ORAL | 0 refills | Status: AC

## 2022-10-10 NOTE — Plan of Care (Signed)
Urine sample obtained for burning urinating sensation. Diet advanced today. Call bell at reach.  Problem: Education: Goal: Ability to describe self-care measures that may prevent or decrease complications (Diabetes Survival Skills Education) will improve Outcome: Progressing Goal: Individualized Educational Video(s) Outcome: Progressing   Problem: Coping: Goal: Ability to adjust to condition or change in health will improve Outcome: Progressing   Problem: Fluid Volume: Goal: Ability to maintain a balanced intake and output will improve Outcome: Progressing   Problem: Health Behavior/Discharge Planning: Goal: Ability to identify and utilize available resources and services will improve Outcome: Progressing Goal: Ability to manage health-related needs will improve Outcome: Progressing   Problem: Metabolic: Goal: Ability to maintain appropriate glucose levels will improve Outcome: Progressing   Problem: Nutritional: Goal: Maintenance of adequate nutrition will improve Outcome: Progressing Goal: Progress toward achieving an optimal weight will improve Outcome: Progressing

## 2022-10-10 NOTE — TOC Initial Note (Signed)
Transition of Care [facility]) - Initial/Assessment Note    Patient Details  Name: Dawn Lambert MRN: [id number] Date of Birth: 01/01/1991  Transition of Care [facility]) CM/SW Contact:    Chapman Fitch, RN Phone Number: 10/10/2022, 4:53 PM  Clinical Narrative:                  .Admitted for: gastritis Admitted from: [facility] PCP: [facility]   Patient to discharge today Patient legal guardian/mother notifed and in agreement of plan  TOC spoke with Rosa at group home and confirms patient can return today.   Rosa to transport after 6pm. Bedside RN aware.  Bedside RN to give report when she arrives.   Hard copy for dc summary and Fl2 printed and bed RN to place in dc packet per Rosas request        Patient Goals and CMS Choice            Expected Discharge Plan and Services         Expected Discharge Date: 10/10/22                                    Prior Living Arrangements/Services                       Activities of Daily Living Home Assistive Devices/Equipment: None ADL Screening (condition at time of admission) Patient's cognitive ability adequate to safely complete daily activities?: Yes Is the patient deaf or have difficulty hearing?: No Does the patient have difficulty seeing, even when wearing glasses/contacts?: No Does the patient have difficulty concentrating, remembering, or making decisions?: Yes Patient able to express need for assistance with ADLs?: Yes Does the patient have difficulty dressing or bathing?: No Independently performs ADLs?: Yes (appropriate for developmental age) Does the patient have difficulty walking or climbing stairs?: No Weakness of Legs: None Weakness of Arms/Hands: None  Permission Sought/Granted                  Emotional Assessment              Admission diagnosis:  Acute gastroenteritis [K52.9] Acute cystitis without hematuria [N30.00] Sepsis without acute organ dysfunction, due to  unspecified organism [facility]) [A41.9] Patient Active Problem List   Diagnosis Date Noted   Sepsis without acute organ dysfunction (HCC) 10/10/2022   Acute gastroenteritis 10/09/2022   Morbid obesity with BMI of 50.0-59.9, adult (HCC) 10/09/2022   Urinary tract infection 10/09/2022   AKI (acute kidney injury) (HCC) 10/09/2022   Hypertension 10/09/2022   Uncontrolled type 2 diabetes mellitus with hyperglycemia, without long-term current use of insulin (HCC) 10/09/2022   Hypothyroidism 10/09/2022   Joint pain 10/09/2022   Behavior concern    Depression    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 [name] [address] Phone: [phone] Fax: [phone]  [facility] - [place], [name] - [address] [name] [address] Phone: [phone] Fax: [phone]     Social Determinants of Health (SDOH) Social History: SDOH Screenings  Food Insecurity: No Food Insecurity (10/09/2022)  Housing: Low Risk  (10/09/2022)  Transportation Needs: No Transportation Needs (10/09/2022)  Utilities: Not At Risk (10/09/2022)  Tobacco Use: Low Risk  (10/08/2022)   SDOH Interventions:     Readmission Risk Interventions     No data to display

## 2022-10-10 NOTE — Discharge Summary (Signed)
Physician Discharge Summary   Patient: Dawn Lambert MRN: [id number] DOB: 22-Mar-1991  Admit date:     10/08/2022  Discharge date: 10/10/22  Discharge Physician: Marcelino Duster   PCP: [facility]   Recommendations at discharge:    PCP follow up in 1 week. Follow urine cultures for antibiotic adjustment. Avoid polypharmacy.  Discharge Diagnoses: Principal Problem:   Acute gastroenteritis Active Problems:   Urinary tract infection   AKI (acute kidney injury) (HCC)   Uncontrolled type 2 diabetes mellitus with hyperglycemia, without long-term current use of insulin (HCC)   Intellectual disability   Depression   Morbid obesity with BMI of 50.0-59.9, adult (HCC)   Hypertension   Hypothyroidism   Joint pain  Resolved Problems:   * No resolved hospital problems. [facility] Course: As per HPI - Dawn Lambert is a 32 y.o. female with medical history significant for Intellectual disability, diabetes, hypertension, hypothyroidism.  Morbid obesity with BMI 50-59.9, who was sent from her group home with initial complaint of pain in the bilateral knees and ankles and wanting to have her psych meds adjusted.  She was evaluated by behavioral health who found her to be depressed but otherwise at no imminent risk to self or others.  She was cleared by psych however while in the ED she developed vomiting and diarrhea, thus prompting a full medical workup.  She had several episodes of emesis, nonbloody nonbilious and started having diarrhea.  She reports that she had none of the symptoms prior to coming to the emergency room.  She denies abdominal pain or fever and chills.  Her knee and ankle pain has been chronic for years.   CT abdomen pelvis showed evidence of circumferential bladder wall thickening possibly related to cystitis, urinalysis abnormal started on IV Rocephin therapy and admitted to hospitalist service for further management evaluation of acute  gastroenteritis, UTI.  Patient's kidney function gradually improved with IV hydration.  Patient is sugars closely monitored and insulin adjusted accordingly.  Today patient's nausea, vomiting improved able to tolerate clears gradually advance to soft diet.  She has mild abdominal discomfort, antibiotic changed to ciprofloxacin.  Her urine cultures from yesterday showed multiple species suggested recollection.  UA and cultures repeat sent awaiting results.  Patient is hemodynamically stable to be discharged back to group home on Cipro for 3 more days.  She is advised to follow-up with primary care physician upon discharge as instructed.  She understands and agrees with the discharge plan.  Consultants: Psychiatry Procedures performed: none  Disposition: Group home Diet recommendation:  Discharge Diet Orders (From admission, onward)     Start     Ordered   10/10/22 0000  Diet - low sodium heart healthy        10/10/22 1556   10/10/22 0000  Diet Carb Modified        10/10/22 1556           Carb modified diet DISCHARGE MEDICATION: Allergies as of 10/10/2022       Reactions   Amoxicillin-pot Clavulanate Other (See Comments)   Has patient had a PCN reaction causing immediate rash, facial/tongue/throat swelling, SOB or lightheadedness with hypotension: ______ Has patient had a PCN reaction causing severe rash involving mucus membranes or skin necrosis: _______ Has patient had a PCN reaction that required hospitalization: _______ Has patient had a PCN reaction occurring within the last 10 years:_______ If all of the above answers are "NO", then may proceed with Cephalosporin use.  Other Reaction(s): Unknown Other reaction(s): Other (See Comments)  Has patient had a PCN reaction causing immediate rash, facial/tongue/throat swelling, SOB or lightheadedness with hypotension: ______  Has patient had a PCN reaction causing severe rash involving mucus membranes or skin necrosis: _______  Has  patient had a PCN reaction that required hospitalization: _______  Has patient had a PCN reaction occurring within the last 10 years:_______  If all of the above answers are "NO", then may proceed with Cephalosporin use.        Medication List     STOP taking these medications    meloxicam 15 MG tablet Commonly known as: MOBIC   metFORMIN 500 MG tablet Commonly known as: GLUCOPHAGE       TAKE these medications    acetaminophen 325 MG tablet Commonly known as: TYLENOL Take 650 mg by mouth every 6 (six) hours as needed for mild pain or moderate pain.   albuterol 108 (90 Base) MCG/ACT inhaler Commonly known as: VENTOLIN HFA Inhale 2 puffs into the lungs every 4 (four) hours as needed.   benzonatate 100 MG capsule Commonly known as: TESSALON Take 100 mg by mouth 3 (three) times daily as needed for cough.   carbamazepine 200 MG tablet Commonly known as: TEGRETOL Take 400 mg by mouth 2 (two) times daily. With food   cetirizine 10 MG tablet Commonly known as: ZYRTEC Take 10 mg by mouth daily as needed for allergies.   cholecalciferol 1000 units tablet Commonly known as: VITAMIN D Take 1,000 Units by mouth daily.   ciprofloxacin 500 MG tablet Commonly known as: Cipro Take 1 tablet (500 mg total) by mouth 2 (two) times daily for 3 days.   clonazePAM 1 MG tablet Commonly known as: KLONOPIN Take 1 mg by mouth 2 (two) times daily.   Fluocinolone Acetonide Scalp 0.01 % Oil Apply to aa's scalp QD on Monday, Wednesday and Friday x 6 weeks. After 6 wks decrease to QW on Friday. May increase to QD on Monday, Wednesday, and Friday during times of flares.   fluticasone 50 MCG/ACT nasal spray Commonly known as: FLONASE Place 1 spray into both nostrils daily as needed for allergies or rhinitis.   gabapentin 800 MG tablet Commonly known as: NEURONTIN Take 800 mg by mouth 3 (three) times daily.   Guaifenesin 1200 MG Tb12 Take 1,200 mg by mouth daily as needed  (Congestion). What changed: Another medication with the same name was removed. Continue taking this medication, and follow the directions you see here.   Hinda Glatter Trinza 819 MG/2.63ML injection Generic drug: paliperidone Palmitate ER Inject 819 mg into the muscle every 3 (three) months.   levothyroxine 175 MCG tablet Commonly known as: SYNTHROID Take 175 mcg by mouth daily before breakfast.   lisinopril 20 MG tablet Commonly known as: ZESTRIL Take 20 mg by mouth at bedtime.   lithium carbonate 300 MG capsule Take 300-600 mg by mouth 2 (two) times daily. Take 300 mg by mouth every morning 600 mg At bedtime   Loreev XR 2 MG Cs24 Generic drug: LORazepam ER Take 2 mg by mouth in the morning.   nystatin powder Generic drug: nystatin Apply 1 Application topically 2 (two) times daily as needed (fungal infection). Apply under breast   propranolol 40 MG tablet Commonly known as: INDERAL Take 40 mg by mouth 2 (two) times daily.   QUEtiapine 400 MG 24 hr tablet Commonly known as: SEROQUEL XR Take 400 mg by mouth at bedtime.   QUEtiapine 50 MG tablet  Commonly known as: SEROQUEL Take 50 mg by mouth daily. At 2pm.   THEREMS PO Take 1 tablet by mouth daily. Take with food   Trulicity 3 MG/0.5ML Sopn Generic drug: Dulaglutide Inject 3 mg into the skin once a week. Wednesday        Discharge Exam: Filed Weights   10/08/22 2154  Weight: 131.5 kg   Blood pressure 138/89, pulse 78, temperature (!) 97.5 F (36.4 C), resp. rate 16, height 5\' 2"  (1.575 m), weight 131.5 kg, SpO2 100 %.   General - Young morbidly obese female, no apparent distress HEENT - PERRLA, EOMI, atraumatic head, non tender sinuses. Lung - Clear, rales, rhonchi, wheezes. Heart - S1, S2 heard, no murmurs, rubs, trace pedal edema. Abdomen -distended, lower abdominal tenderness, no rigidity, bowel sounds good. Neuro - Alert, awake and oriented, non focal exam. Skin - Warm and dry.  Condition at discharge:  stable  The results of significant diagnostics from this hospitalization (including imaging, microbiology, ancillary and laboratory) are listed below for reference.   Imaging Studies: CT ABDOMEN PELVIS W CONTRAST  Result Date: 10/09/2022 CLINICAL DATA:  lower abd pain, emesis EXAM: CT ABDOMEN AND PELVIS WITH CONTRAST TECHNIQUE: Multidetector CT imaging of the abdomen and pelvis was performed using the standard protocol following bolus administration of intravenous contrast. RADIATION DOSE REDUCTION: This exam was performed according to the departmental dose-optimization program which includes automated exposure control, adjustment of the mA and/or kV according to patient size and/or use of iterative reconstruction technique. CONTRAST:  OMNIPAQUE IOHEXOL 300 MG/ML  SOLN COMPARISON:  None Available. FINDINGS: Lower chest: No acute abnormality. Hepatobiliary: No focal liver abnormality is seen. No gallstones, gallbladder wall thickening, or biliary dilatation. Pancreas: Unremarkable Spleen: Unremarkable Adrenals/Urinary Tract: The adrenal glands are unremarkable. The kidneys are normal. The bladder is decompressed. There is, however, superimposed circumferential bladder wall thickening and a superimposed diffuse infectious or inflammatory cystitis is difficult to exclude. No perivesicular inflammatory stranding or fluid collections are identified. Stomach/Bowel: Stomach is within normal limits. Appendix appears normal. No evidence of bowel wall thickening, distention, or inflammatory changes. Vascular/Lymphatic: No significant vascular findings are present. No enlarged abdominal or pelvic lymph nodes. Reproductive: Uterus and bilateral adnexa are unremarkable. Other: No abdominal wall hernia or abnormality. No abdominopelvic ascites. Musculoskeletal: No acute or significant osseous findings. IMPRESSION: 1. Circumferential bladder wall thickening, possibly related to underlying cystitis. Correlation with  urinalysis and urine culture may be helpful for further evaluation. 2. No other acute intra-abdominal pathology identified. Electronically Signed   By: Helyn Numbers M.D.   On: 10/09/2022 03:35    Microbiology: Results for orders placed or performed during the hospital encounter of 10/08/22  Urine Culture     Status: Abnormal   Collection Time: 10/09/22 12:27 AM   Specimen: Urine, Clean Catch  Result Value Ref Range Status   Specimen Description   Final    URINE, CLEAN CATCH Performed at [facility], [address], [place], [name] [address]    Special Requests   Final    NONE Performed at [facility], [address] [place], [name] [address]    Culture MULTIPLE SPECIES PRESENT, SUGGEST RECOLLECTION (A)  Final   Report Status 10/10/2022 FINAL  Final  Culture, blood (single)     Status: None (Preliminary result)   Collection Time: 10/09/22  4:40 AM   Specimen: BLOOD RIGHT ARM  Result Value Ref Range Status   Specimen Description BLOOD RIGHT ARM  Final   Special  Requests   Final    BOTTLES DRAWN AEROBIC AND ANAEROBIC Blood Culture adequate volume   Culture   Final    NO GROWTH 1 DAY Performed at [facility], [address] [place], [name] [address]    Report Status PENDING  Incomplete  SARS Coronavirus 2 by RT PCR (hospital order, performed in [facility] hospital lab) *cepheid single result test* Anterior Nasal Swab     Status: None   Collection Time: 10/09/22  8:22 AM   Specimen: Anterior Nasal Swab  Result Value Ref Range Status   SARS Coronavirus 2 by RT PCR NEGATIVE NEGATIVE Final    Comment: (NOTE) SARS-CoV-2 target nucleic acids are NOT DETECTED.  The SARS-CoV-2 RNA is generally detectable in upper and lower respiratory specimens during the acute phase of infection. The lowest concentration of SARS-CoV-2 viral copies this assay can detect is 250 copies / mL. A negative result does not preclude SARS-CoV-2 infection and should not be  used as the sole basis for treatment or other patient management decisions.  A negative result may occur with improper specimen collection / handling, submission of specimen other than nasopharyngeal swab, presence of viral mutation(s) within the areas targeted by this assay, and inadequate number of viral copies (<250 copies / mL). A negative result must be combined with clinical observations, patient history, and epidemiological information.  Fact Sheet for Patients:   RoadLapTop.co.za  Fact Sheet for Healthcare Providers: http://kim-miller.com/  This test is not yet approved or  cleared by the [place] FDA and has been authorized for detection and/or diagnosis of SARS-CoV-2 by FDA under an Emergency Use Authorization (EUA).  This EUA will remain in effect (meaning this test can be used) for the duration of the COVID-19 declaration under Section 564(b)(1) of the Act, 21 U.S.C. section 360bbb-3(b)(1), unless the authorization is terminated or revoked sooner.  Performed at [facility], [address] [place], [name] [address]   Gastrointestinal Panel by PCR , Stool     Status: None   Collection Time: 10/09/22 10:52 AM   Specimen: Stool  Result Value Ref Range Status   Campylobacter species NOT DETECTED NOT DETECTED Final   Plesimonas shigelloides NOT DETECTED NOT DETECTED Final   Salmonella species NOT DETECTED NOT DETECTED Final   Yersinia enterocolitica NOT DETECTED NOT DETECTED Final   Vibrio species NOT DETECTED NOT DETECTED Final   Vibrio cholerae NOT DETECTED NOT DETECTED Final   Enteroaggregative E coli (EAEC) NOT DETECTED NOT DETECTED Final   Enteropathogenic E coli (EPEC) NOT DETECTED NOT DETECTED Final   Enterotoxigenic E coli (ETEC) NOT DETECTED NOT DETECTED Final   Shiga like toxin producing E coli (STEC) NOT DETECTED NOT DETECTED Final   Shigella/Enteroinvasive E coli (EIEC) NOT DETECTED NOT DETECTED  Final   Cryptosporidium NOT DETECTED NOT DETECTED Final   Cyclospora cayetanensis NOT DETECTED NOT DETECTED Final   Entamoeba histolytica NOT DETECTED NOT DETECTED Final   Giardia lamblia NOT DETECTED NOT DETECTED Final   Adenovirus F40/41 NOT DETECTED NOT DETECTED Final   Astrovirus NOT DETECTED NOT DETECTED Final   Norovirus GI/GII NOT DETECTED NOT DETECTED Final   Rotavirus A NOT DETECTED NOT DETECTED Final   Sapovirus (I, II, IV, and V) NOT DETECTED NOT DETECTED Final    Comment: Performed at [facility], [address], [place], [name] [address]  C Difficile Quick Screen w PCR reflex     Status: None   Collection Time: 10/09/22 10:52 AM   Specimen:  Stool  Result Value Ref Range Status   C Diff antigen NEGATIVE NEGATIVE Final   C Diff toxin NEGATIVE NEGATIVE Final   C Diff interpretation No C. difficile detected.  Final    Comment: Performed at [facility], [address] [place], [name] [address]    Labs: CBC: Recent Labs  Lab 10/09/22 0029 10/09/22 0440 10/10/22 0510  WBC 22.6* 15.1* 8.6  NEUTROABS 20.4*  --   --   HGB 11.9* 10.6* 9.4*  HCT 38.7 34.0* 30.2*  MCV 93.7 93.9 93.5  PLT 299 231 224   Basic Metabolic Panel: Recent Labs  Lab 10/09/22 0029 10/09/22 0440 10/10/22 0511  NA 136  --  142  K 3.7  --  3.5  CL 103  --  111  CO2 19*  --  22  GLUCOSE 221*  --  129*  BUN 21*  --  11  CREATININE 1.15* 1.10* 0.81  CALCIUM 9.0  --  7.9*   Liver Function Tests: Recent Labs  Lab 10/09/22 0029  AST 24  ALT 19  ALKPHOS 78  BILITOT 0.6  PROT 8.1  ALBUMIN 4.2   CBG: Recent Labs  Lab 10/09/22 1235 10/09/22 1632 10/09/22 2117 10/10/22 0902 10/10/22 1215  GLUCAP 134* 130* 131* 150* 133*    Discharge time spent: greater than 30 minutes.  Signed: Marcelino Duster, MD [facility] 10/10/2022

## 2022-10-10 NOTE — Plan of Care (Signed)
The patient has been discharged with facility staff. IV removed. Education has been completed. Problem: Education: Goal: Ability to describe self-care measures that may prevent or decrease complications (Diabetes Survival Skills Education) will improve 10/10/2022 1950 by Murriel Hopper, Donald Pore, RN Outcome: Not Applicable 10/10/2022 1221 by Murriel Hopper, Donald Pore, RN Outcome: Progressing Goal: Individualized Educational Video(s) 10/10/2022 1950 by Murriel Hopper, Donald Pore, RN Outcome: Not Applicable 10/10/2022 1221 by Murriel Hopper, Donald Pore, RN Outcome: Progressing   Problem: Coping: Goal: Ability to adjust to condition or change in health will improve 10/10/2022 1950 by Monica Becton, RN Outcome: Not Applicable 10/10/2022 1221 by Murriel Hopper, Donald Pore, RN Outcome: Progressing   Problem: Fluid Volume: Goal: Ability to maintain a balanced intake and output will improve 10/10/2022 1950 by Monica Becton, RN Outcome: Not Applicable 10/10/2022 1221 by Murriel Hopper, Donald Pore, RN Outcome: Progressing   Problem: Health Behavior/Discharge Planning: Goal: Ability to identify and utilize available resources and services will improve 10/10/2022 1950 by Monica Becton, RN Outcome: Not Applicable 10/10/2022 1221 by Murriel Hopper, Donald Pore, RN Outcome: Progressing Goal: Ability to manage health-related needs will improve 10/10/2022 1950 by Monica Becton, RN Outcome: Not Applicable 10/10/2022 1221 by Murriel Hopper, Donald Pore, RN Outcome: Progressing   Problem: Metabolic: Goal: Ability to maintain appropriate glucose levels will improve 10/10/2022 1950 by Monica Becton, RN Outcome: Not Applicable 10/10/2022 1221 by Murriel Hopper, Donald Pore, RN Outcome: Progressing   Problem: Nutritional: Goal: Maintenance of adequate nutrition will improve 10/10/2022 1950 by Monica Becton, RN Outcome: Not Applicable 10/10/2022 1221 by Murriel Hopper,  Donald Pore, RN Outcome: Progressing Goal: Progress toward achieving an optimal weight will improve 10/10/2022 1950 by Monica Becton, RN Outcome: Not Applicable 10/10/2022 1221 by Murriel Hopper, Donald Pore, RN Outcome: Progressing   Problem: Skin Integrity: Goal: Risk for impaired skin integrity will decrease 10/10/2022 1950 by Monica Becton, RN Outcome: Not Applicable 10/10/2022 1221 by Murriel Hopper, Donald Pore, RN Outcome: Progressing   Problem: Tissue Perfusion: Goal: Adequacy of tissue perfusion will improve 10/10/2022 1950 by Monica Becton, RN Outcome: Not Applicable 10/10/2022 1221 by Murriel Hopper, Donald Pore, RN Outcome: Progressing   Problem: Health Behavior/Discharge Planning: Goal: Ability to manage health-related needs will improve 10/10/2022 1950 by Monica Becton, RN Outcome: Not Applicable 10/10/2022 1221 by Murriel Hopper, Donald Pore, RN Outcome: Progressing   Problem: Clinical Measurements: Goal: Ability to maintain clinical measurements within normal limits will improve 10/10/2022 1950 by Monica Becton, RN Outcome: Not Applicable 10/10/2022 1221 by Murriel Hopper, Donald Pore, RN Outcome: Progressing Goal: Will remain free from infection 10/10/2022 1950 by Monica Becton, RN Outcome: Not Applicable 10/10/2022 1221 by Murriel Hopper, Donald Pore, RN Outcome: Progressing Goal: Diagnostic test results will improve 10/10/2022 1950 by Monica Becton, RN Outcome: Not Applicable 10/10/2022 1221 by Murriel Hopper, Donald Pore, RN Outcome: Progressing Goal: Respiratory complications will improve 10/10/2022 1950 by Monica Becton, RN Outcome: Not Applicable 10/10/2022 1221 by Murriel Hopper, Donald Pore, RN Outcome: Progressing Goal: Cardiovascular complication will be avoided 10/10/2022 1950 by Monica Becton, RN Outcome: Not Applicable 10/10/2022 1221 by Murriel Hopper, Donald Pore, RN Outcome: Progressing   Problem:  Activity: Goal: Risk for activity intolerance will decrease 10/10/2022 1950 by Monica Becton, RN Outcome: Not Applicable 10/10/2022 1221 by Murriel Hopper, Donald Pore, RN Outcome: Progressing   Problem: Coping: Goal: Level of anxiety will decrease 10/10/2022 1950 by Monica Becton, RN Outcome: Not Applicable 10/10/2022 1221 by Murriel Hopper, Donald Pore, RN Outcome: Progressing   Problem: Elimination: Goal: Will not experience complications  related to bowel motility 10/10/2022 1950 by Monica Becton, RN Outcome: Not Applicable 10/10/2022 1221 by Murriel Hopper, Donald Pore, RN Outcome: Progressing Goal: Will not experience complications related to urinary retention 10/10/2022 1950 by Monica Becton, RN Outcome: Not Applicable 10/10/2022 1221 by Murriel Hopper, Donald Pore, RN Outcome: Progressing

## 2022-10-10 NOTE — [place] FL2 (Signed)
[place] MEDICAID FL2 LEVEL OF CARE FORM     IDENTIFICATION  Patient Name: Dawn Lambert Birthdate: 26-Mar-1991 Sex: female Admission Date (Current Location): 10/08/2022  [facility] and [place] Number:  Chiropodist and Address:         Provider Number: [id number]  Attending Physician Name and Address:  Marcelino Duster, MD  Relative Name and Phone Number:       Current Level of Care: Hospital Recommended Level of Care: Family Care Home Prior Approval Number:    Date Approved/Denied:   PASRR Number:    Discharge Plan: Other (Comment) (Family care home/group home)    Current Diagnoses: Patient Active Problem List   Diagnosis Date Noted   Sepsis without acute organ dysfunction (HCC) 10/10/2022   Acute gastroenteritis 10/09/2022   Morbid obesity with BMI of 50.0-59.9, adult (HCC) 10/09/2022   Urinary tract infection 10/09/2022   AKI (acute kidney injury) (HCC) 10/09/2022   Hypertension 10/09/2022   Uncontrolled type 2 diabetes mellitus with hyperglycemia, without long-term current use of insulin (HCC) 10/09/2022   Hypothyroidism 10/09/2022   Joint pain 10/09/2022   Behavior concern    Depression    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   Suicidal ideation 09/19/2020   Intellectual disability 10/12/2017   Umbilical hernia without obstruction and without gangrene 08/15/2017   Chest pain 11/05/2012   Atrial septal defect 09/19/2011    Orientation RESPIRATION BLADDER Height & Weight     Situation, Self, Time, Place  Normal Continent Weight: 131.5 kg Height:  5\' 2"  (157.5 cm)  BEHAVIORAL SYMPTOMS/MOOD NEUROLOGICAL BOWEL NUTRITION STATUS      Continent Diet (soft)  AMBULATORY STATUS COMMUNICATION OF NEEDS Skin   Independent Verbally Normal                       Personal Care Assistance Level of Assistance              Functional Limitations Info             SPECIAL CARE FACTORS FREQUENCY                        Contractures Contractures Info: Not present    Additional Factors Info  Code Status, Allergies Code Status Info: Full Allergies Info: Amoxicillin-pot Clavulante           Medication List       STOP taking these medications     meloxicam 15 MG tablet Commonly known as: MOBIC    metFORMIN 500 MG tablet Commonly known as: GLUCOPHAGE           TAKE these medications     acetaminophen 325 MG tablet Commonly known as: TYLENOL Take 650 mg by mouth every 6 (six) hours as needed for mild pain or moderate pain.    albuterol 108 (90 Base) MCG/ACT inhaler Commonly known as: VENTOLIN HFA Inhale 2 puffs into the lungs every 4 (four) hours as needed.    benzonatate 100 MG capsule Commonly known as: TESSALON Take 100 mg by mouth 3 (three) times daily as needed for cough.    carbamazepine 200 MG tablet Commonly known as: TEGRETOL Take 400 mg by mouth 2 (two) times daily. With food    cetirizine 10 MG tablet Commonly known as: ZYRTEC Take 10 mg by mouth daily as needed for allergies.    cholecalciferol 1000 units tablet Commonly known as: VITAMIN D Take 1,000 Units  by mouth daily.    ciprofloxacin 500 MG tablet Commonly known as: Cipro Take 1 tablet (500 mg total) by mouth 2 (two) times daily for 3 days.    clonazePAM 1 MG tablet Commonly known as: KLONOPIN Take 1 mg by mouth 2 (two) times daily.    Fluocinolone Acetonide Scalp 0.01 % Oil Apply to aa's scalp QD on Monday, Wednesday and Friday x 6 weeks. After 6 wks decrease to QW on Friday. May increase to QD on Monday, Wednesday, and Friday during times of flares.    fluticasone 50 MCG/ACT nasal spray Commonly known as: FLONASE Place 1 spray into both nostrils daily as needed for allergies or rhinitis.    gabapentin 800 MG tablet Commonly known as: NEURONTIN Take 800 mg by mouth 3 (three) times daily.    Guaifenesin 1200 MG Tb12 Take 1,200 mg by mouth daily as needed  (Congestion). What changed: Another medication with the same name was removed. Continue taking this medication, and follow the directions you see here.    Hinda Glatter Trinza 819 MG/2.63ML injection Generic drug: paliperidone Palmitate ER Inject 819 mg into the muscle every 3 (three) months.    levothyroxine 175 MCG tablet Commonly known as: SYNTHROID Take 175 mcg by mouth daily before breakfast.    lisinopril 20 MG tablet Commonly known as: ZESTRIL Take 20 mg by mouth at bedtime.    lithium carbonate 300 MG capsule Take 300-600 mg by mouth 2 (two) times daily. Take 300 mg by mouth every morning 600 mg At bedtime    Loreev XR 2 MG Cs24 Generic drug: LORazepam ER Take 2 mg by mouth in the morning.    nystatin powder Generic drug: nystatin Apply 1 Application topically 2 (two) times daily as needed (fungal infection). Apply under breast    propranolol 40 MG tablet Commonly known as: INDERAL Take 40 mg by mouth 2 (two) times daily.    QUEtiapine 400 MG 24 hr tablet Commonly known as: SEROQUEL XR Take 400 mg by mouth at bedtime.    QUEtiapine 50 MG tablet Commonly known as: SEROQUEL Take 50 mg by mouth daily. At 2pm.    THEREMS PO Take 1 tablet by mouth daily. Take with food    Trulicity 3 MG/0.5ML Sopn Generic drug: Dulaglutide Inject 3 mg into the skin once a week. Wednesday        Relevant Imaging Results:  Relevant Lab Results:   Additional Information    Kreg Shropshire, RN

## 2022-10-10 NOTE — Progress Notes (Signed)
Discharge complete. Discharged to the care of Dawn Lambert.  [name] [name]

## 2022-10-13 LAB — CULTURE, BLOOD (SINGLE)

## 2022-10-14 LAB — CULTURE, BLOOD (SINGLE): Special Requests: ADEQUATE

## 2022-12-04 ENCOUNTER — Ambulatory Visit (INDEPENDENT_AMBULATORY_CARE_PROVIDER_SITE_OTHER): Payer: 59 | Admitting: Internal Medicine

## 2022-12-04 VITALS — BP 142/88 | HR 65 | Resp 16 | Ht 62.0 in | Wt 289.0 lb

## 2022-12-04 DIAGNOSIS — R0681 Apnea, not elsewhere classified: Secondary | ICD-10-CM | POA: Diagnosis not present

## 2022-12-04 DIAGNOSIS — R0683 Snoring: Secondary | ICD-10-CM | POA: Insufficient documentation

## 2022-12-04 DIAGNOSIS — Z6841 Body Mass Index (BMI) 40.0 and over, adult: Secondary | ICD-10-CM

## 2022-12-04 NOTE — Progress Notes (Signed)
Sleep Medicine   Office Visit  Patient Name: Dawn Lambert DOB: Apr 28, 1991 MRN [id number]    Chief Complaint: sleep evaluation  Brief History:  Koralyn presents with a about 10 year history of loud snoring.  Sleep quality is good. This is noted most nights. The patient's bed partner reports loud snoring and witnessed apnea at night. The patient relates the following symptoms: loud snoring, gasping, excessive daytime sleepiness and witnessed apnea are also present. The patient goes to sleep at 7:30-8 pm and wakes up at 6-6:30 am.  Patient has noted some restlessness of her legs at night.  The patient  relates no unusual behavior during the night.  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 The Epworth Sleepiness Score is 16 out of 24 .  The patient relates  Cardiovascular risk factors include: ASD, this was repaired at age 32, and hypertension.    ROS  General: (-) fever, (-) chills, (-) night sweat Nose and Sinuses: (-) nasal stuffiness or itchiness, (-) postnasal drip, (-) nosebleeds, (-) sinus trouble. Mouth and Throat: (-) sore throat, (-) hoarseness. Neck: (-) swollen glands, (-) enlarged thyroid, (-) neck pain. Respiratory: - cough, - shortness of breath, - wheezing. Neurologic: - numbness, - tingling. Psychiatric: + anxiety, - depression Sleep behavior: -sleep paralysis -hypnogogic hallucinations -dream enactment      -vivid dreams -cataplexy -night terrors -sleep walking   Current Medication: Outpatient Encounter Medications as of 12/04/2022  Medication Sig Note   acetaminophen (TYLENOL) 325 MG tablet Take 650 mg by mouth every 6 (six) hours as needed for mild pain or moderate pain.    albuterol (VENTOLIN HFA) 108 (90 Base) MCG/ACT inhaler Inhale 2 puffs into the lungs every 4 (four) hours as needed.    benzonatate (TESSALON) 100 MG capsule Take 100 mg by mouth 3 (three) times daily as needed for  cough.    carbamazepine (TEGRETOL) 200 MG tablet Take 400 mg by mouth 2 (two) times daily. With food    cetirizine (ZYRTEC) 10 MG tablet Take 10 mg by mouth daily as needed for allergies.    cholecalciferol (VITAMIN D) 1000 units tablet Take 1,000 Units by mouth daily.     clonazePAM (KLONOPIN) 1 MG tablet Take 1 mg by mouth 2 (two) times daily. 10/09/2022: Also can take 1 tablet daily as needed   Fluocinolone Acetonide Scalp 0.01 % OIL Apply to aa's scalp QD on Monday, Wednesday and Friday x 6 weeks. After 6 wks decrease to QW on Friday. May increase to QD on Monday, Wednesday, and Friday during times of flares.    fluticasone (FLONASE) 50 MCG/ACT nasal spray Place 1 spray into both nostrils daily as needed for allergies or rhinitis.    gabapentin (NEURONTIN) 800 MG tablet Take 800 mg by mouth 3 (three) times daily.    Guaifenesin 1200 MG TB12 Take 1,200 mg by mouth daily as needed (Congestion).    INVEGA TRINZA 819 MG/2.63ML injection Inject 819 mg into the muscle every 3 (three) months.    levothyroxine (SYNTHROID, LEVOTHROID) 175 MCG tablet Take 175 mcg by mouth daily before breakfast.    lisinopril (PRINIVIL,ZESTRIL) 20 MG tablet Take 20 mg by mouth at bedtime.    lithium carbonate 300 MG capsule Take 300-600 mg by mouth 2 (two) times daily. Take 300 mg by mouth every morning 600 mg At bedtime    LORazepam ER (LOREEV XR) 2 MG CS24 Take 2 mg by mouth in  the morning.    Multiple Vitamin (THEREMS PO) Take 1 tablet by mouth daily. Take with food    nystatin powder Apply 1 Application topically 2 (two) times daily as needed (fungal infection). Apply under breast    propranolol (INDERAL) 40 MG tablet Take 40 mg by mouth 2 (two) times daily.    QUEtiapine (SEROQUEL XR) 400 MG 24 hr tablet Take 400 mg by mouth at bedtime.    QUEtiapine (SEROQUEL) 50 MG tablet Take 50 mg by mouth daily. At 2pm.    TRULICITY 3 MG/0.5ML SOPN Inject 3 mg into the skin once a week. Wednesday    [DISCONTINUED] ziprasidone  (GEODON) 20 MG capsule Take 1 capsule (20 mg total) by mouth once as needed (severe agitation).    No facility-administered encounter medications on file as of 12/04/2022.    Surgical History: Past Surgical History:  Procedure Laterality Date   ASD REPAIR     at age 13   UMBILICAL HERNIA REPAIR N/A 09/17/2017   Procedure: HERNIA REPAIR UMBILICAL ADULT;  Surgeon: Earline Mayotte, MD;  Location: [facility] ORS;  Service: General;  Laterality: N/A;    Medical History: Past Medical History:  Diagnosis Date   Anxiety    Arnold-Chiari malformation (HCC)    Atrial septal defect    Complication of anesthesia    when pt was 32, woke up during surgery (muscle biopsy)   Constipation    Contraception management    Dandruff    Dermatomyositis (HCC)    age 57   Diabetes mellitus without complication (HCC)    Elevated blood pressure    Hypertension    Hypothyroidism    Mental retardation    Mood disorder (HCC)    Overweight(278.02)    Pelvic pain    Prediabetes     Family History: Non contributory to the present illness  Social History: Social History   Socioeconomic History   Marital status: Single    Spouse name: Not on file   Number of children: Not on file   Years of education: Not on file   Highest education level: Not on file  Occupational History   Not on file  Tobacco Use   Smoking status: Never   Smokeless tobacco: Never  Vaping Use   Vaping status: Never Used  Substance and Sexual Activity   Alcohol use: No   Drug use: No   Sexual activity: Not on file  Other Topics Concern   Not on file  Social History Narrative   Not on file   Social Determinants of Health   Financial Resource Strain: Not on file  Food Insecurity: No Food Insecurity (10/09/2022)   Hunger Vital Sign    Worried About Running Out of Food in the Last Year: Never true    Ran Out of Food in the Last Year: Never true  Transportation Needs: No Transportation Needs (10/09/2022)   PRAPARE -  Administrator, Civil Service (Medical): No    Lack of Transportation (Non-Medical): No  Physical Activity: Not on file  Stress: Not on file  Social Connections: Not on file  Intimate Partner Violence: Not At Risk (10/09/2022)   Humiliation, Afraid, Rape, and Kick questionnaire    Fear of Current or Ex-Partner: No    Emotionally Abused: No    Physically Abused: No    Sexually Abused: No    Vital Signs: Blood pressure (!) 142/88, pulse 65, resp. rate 16, height 5\' 2"  (1.575 m), weight 289 lb (131.1  kg), SpO2 98%. Body mass index is 52.86 kg/m.   Examination: General Appearance: The patient is well-developed, well-nourished, and in no distress. Neck Circumference: 45 cm Skin: Gross inspection of skin unremarkable. Head: normocephalic, no gross deformities. Eyes: no gross deformities noted. ENT: ears appear grossly normal Neurologic: Alert and oriented. No involuntary movements.    STOP BANG RISK ASSESSMENT S (snore) Have you been told that you snore?     YES   T (tired) Are you often tired, fatigued, or sleepy during the day?   YES  O (obstruction) Do you stop breathing, choke, or gasp during sleep? YES   P (pressure) Do you have or are you being treated for high blood pressure? YES   B (BMI) Is your body index greater than 35 kg/m? NO   A (age) Are you 65 years old or older? NO   N (neck) Do you have a neck circumference greater than 16 inches?   YES  G (gender) Are you a female? NO   TOTAL STOP/BANG "YES" ANSWERS 5                                                               A STOP-Bang score of 2 or less is considered low risk, and a score of 5 or more is high risk for having either moderate or severe OSA. For people who score 3 or 4, doctors may need to perform further assessment to determine how likely they are to have OSA.         EPWORTH SLEEPINESS SCALE:  Scale:  (0)= no chance of dozing; (1)= slight chance of dozing; (2)= moderate chance of  dozing; (3)= high chance of dozing  Chance  Situtation    Sitting and reading: 3    Watching TV: 3    Sitting Inactive in public: 3    As a passenger in car: 2      Lying down to rest: 2    Sitting and talking: 1    Sitting quielty after lunch: 2    In a car, stopped in traffic: 0   TOTAL SCORE:   16 out of 24    SLEEP STUDIES:  PSG (07/15/18) AHI 0, min SPO2 81%   LABS: Recent Results (from the past 2160 hour(s))  Urine Culture     Status: Abnormal   Collection Time: 10/09/22 12:27 AM   Specimen: Urine, Clean Catch  Result Value Ref Range   Specimen Description      URINE, CLEAN CATCH Performed at [facility], [address] [place], [name] [address]    Special Requests      NONE Performed at [facility], [address] [place], [name] [address]    Culture MULTIPLE SPECIES PRESENT, SUGGEST RECOLLECTION (A)    Report Status 10/10/2022 FINAL   Lithium level     Status: Abnormal   Collection Time: 10/09/22 12:27 AM  Result Value Ref Range   Lithium Lvl 0.51 (L) 0.60 - 1.20 mmol/L    Comment: Performed at [facility], [address] [place], [name] [address]  Urinalysis, Routine w reflex microscopic -Urine, Clean Catch     Status: Abnormal   Collection Time: 10/09/22 12:29 AM  Result Value Ref Range   Color, Urine  AMBER (A) YELLOW    Comment: BIOCHEMICALS MAY BE AFFECTED BY COLOR   APPearance TURBID (A) CLEAR   Specific Gravity, Urine 1.020 1.005 - 1.030   pH 5.0 5.0 - 8.0   Glucose, UA NEGATIVE NEGATIVE mg/dL   Hgb urine dipstick MODERATE (A) NEGATIVE   Bilirubin Urine NEGATIVE NEGATIVE   Ketones, ur NEGATIVE NEGATIVE mg/dL   Protein, ur 30 (A) NEGATIVE mg/dL   Nitrite NEGATIVE NEGATIVE   Leukocytes,Ua LARGE (A) NEGATIVE   RBC / HPF 21-50 0 - 5 RBC/hpf   WBC, UA >50 0 - 5 WBC/hpf   Bacteria, UA MANY (A) NONE SEEN   Squamous Epithelial / HPF 21-50 0 - 5 /HPF   Mucus PRESENT    Hyaline Casts, UA PRESENT      Comment: Performed at [facility], [address] [place], [name] [address]  Urine Drug Screen, Qualitative ([facility] only)     Status: Abnormal   Collection Time: 10/09/22 12:29 AM  Result Value Ref Range   Tricyclic, Ur Screen POSITIVE (A) NONE DETECTED   Amphetamines, Ur Screen NONE DETECTED NONE DETECTED   MDMA (Ecstasy)Ur Screen NONE DETECTED NONE DETECTED   Cocaine Metabolite,Ur [place] NONE DETECTED NONE DETECTED   Opiate, Ur Screen NONE DETECTED NONE DETECTED   Phencyclidine (PCP) Ur S NONE DETECTED NONE DETECTED   Cannabinoid 50 Ng, Ur [place] NONE DETECTED NONE DETECTED   Barbiturates, Ur Screen NONE DETECTED NONE DETECTED   Benzodiazepine, Ur Scrn POSITIVE (A) NONE DETECTED   Methadone Scn, Ur NONE DETECTED NONE DETECTED    Comment: (NOTE) Tricyclics + metabolites, urine    Cutoff 1000 ng/mL Amphetamines + metabolites, urine  Cutoff 1000 ng/mL MDMA (Ecstasy), urine              Cutoff 500 ng/mL Cocaine Metabolite, urine          Cutoff 300 ng/mL Opiate + metabolites, urine        Cutoff 300 ng/mL Phencyclidine (PCP), urine         Cutoff 25 ng/mL Cannabinoid, urine                 Cutoff 50 ng/mL Barbiturates + metabolites, urine  Cutoff 200 ng/mL Benzodiazepine, urine              Cutoff 200 ng/mL Methadone, urine                   Cutoff 300 ng/mL  The urine drug screen provides only a preliminary, unconfirmed analytical test result and should not be used for non-medical purposes. Clinical consideration and professional judgment should be applied to any positive drug screen result due to possible interfering substances. A more specific alternate chemical method must be used in order to obtain a confirmed analytical result. Gas chromatography / mass spectrometry (GC/MS) is the preferred confirm atory method. Performed at [facility], [address] [place], [name] [address]   CBC with Differential     Status: Abnormal   Collection Time: 10/09/22 12:29 AM   Result Value Ref Range   WBC 22.6 (H) 4.0 - 10.5 K/uL   RBC 4.13 3.87 - 5.11 MIL/uL   Hemoglobin 11.9 (L) 12.0 - 15.0 g/dL   HCT 09.8 11.9 - 14.7 %   MCV 93.7 80.0 - 100.0 fL   MCH 28.8 26.0 - 34.0 pg   MCHC 30.7 30.0 - 36.0 g/dL   RDW 82.9 56.2 - 13.0 %   Platelets 299 150 - 400  K/uL   nRBC 0.0 0.0 - 0.2 %   Neutrophils Relative % 91 %   Neutro Abs 20.4 (H) 1.7 - 7.7 K/uL   Lymphocytes Relative 3 %   Lymphs Abs 0.7 0.7 - 4.0 K/uL   Monocytes Relative 5 %   Monocytes Absolute 1.1 (H) 0.1 - 1.0 K/uL   Eosinophils Relative 1 %   Eosinophils Absolute 0.3 0.0 - 0.5 K/uL   Basophils Relative 0 %   Basophils Absolute 0.1 0.0 - 0.1 K/uL   Immature Granulocytes 0 %   Abs Immature Granulocytes 0.10 (H) 0.00 - 0.07 K/uL    Comment: Performed at [facility], [address] [place], [name] [address]  Comprehensive metabolic panel     Status: Abnormal   Collection Time: 10/09/22 12:29 AM  Result Value Ref Range   Sodium 136 135 - 145 mmol/L   Potassium 3.7 3.5 - 5.1 mmol/L   Chloride 103 98 - 111 mmol/L   CO2 19 (L) 22 - 32 mmol/L   Glucose, Bld 221 (H) 70 - 99 mg/dL    Comment: Glucose reference range applies only to samples taken after fasting for at least 8 hours.   BUN 21 (H) 6 - 20 mg/dL   Creatinine, Ser 6.04 (H) 0.44 - 1.00 mg/dL   Calcium 9.0 8.9 - 54.0 mg/dL   Total Protein 8.1 6.5 - 8.1 g/dL   Albumin 4.2 3.5 - 5.0 g/dL   AST 24 15 - 41 U/L   ALT 19 0 - 44 U/L   Alkaline Phosphatase 78 38 - 126 U/L   Total Bilirubin 0.6 0.3 - 1.2 mg/dL   GFR, Estimated >98 >11 mL/min    Comment: (NOTE) Calculated using the CKD-EPI Creatinine Equation (2021)    Anion gap 14 5 - 15    Comment: Performed at [facility], [address], [place], [name] [address]  Ethanol     Status: None   Collection Time: 10/09/22 12:29 AM  Result Value Ref Range   Alcohol, Ethyl (B) <10 <10 mg/dL    Comment: (NOTE) Lowest detectable limit for serum alcohol is 10 mg/dL.  For  medical purposes only. Performed at [facility], [address] [place], [name] [address]   Salicylate level     Status: Abnormal   Collection Time: 10/09/22 12:29 AM  Result Value Ref Range   Salicylate Lvl <7.0 (L) 7.0 - 30.0 mg/dL    Comment: Performed at [facility], [address] [place], [name] [address]  hCG, quantitative, pregnancy     Status: None   Collection Time: 10/09/22 12:29 AM  Result Value Ref Range   hCG, Beta Chain, Quant, S <1 <5 mIU/mL    Comment:          GEST. AGE      CONC.  (mIU/mL)   <=1 WEEK        5 - 50     2 WEEKS       50 - 500     3 WEEKS       100 - 10,000     4 WEEKS     1,000 - 30,000     5 WEEKS     3,500 - 115,000   6-8 WEEKS     12,000 - 270,000    12 WEEKS     15,000 - 220,000        FEMALE AND NON-PREGNANT FEMALE:     LESS THAN 5  mIU/mL Performed at [facility], [address] [place], [name] [address]   Culture, blood (single)     Status: None   Collection Time: 10/09/22  4:40 AM   Specimen: BLOOD RIGHT ARM  Result Value Ref Range   Specimen Description BLOOD RIGHT ARM    Special Requests      BOTTLES DRAWN AEROBIC AND ANAEROBIC Blood Culture adequate volume   Culture      NO GROWTH 5 DAYS Performed at [facility], [address] [place], [name] [address]    Report Status 10/14/2022 FINAL   CBC     Status: Abnormal   Collection Time: 10/09/22  4:40 AM  Result Value Ref Range   WBC 15.1 (H) 4.0 - 10.5 K/uL   RBC 3.62 (L) 3.87 - 5.11 MIL/uL   Hemoglobin 10.6 (L) 12.0 - 15.0 g/dL   HCT 82.9 (L) 56.2 - 13.0 %   MCV 93.9 80.0 - 100.0 fL   MCH 29.3 26.0 - 34.0 pg   MCHC 31.2 30.0 - 36.0 g/dL   RDW 86.5 78.4 - 69.6 %   Platelets 231 150 - 400 K/uL   nRBC 0.0 0.0 - 0.2 %    Comment: Performed at [facility], [address] [place], [name] [address]  Creatinine, serum     Status: Abnormal   Collection Time: 10/09/22  4:40 AM  Result Value Ref Range   Creatinine, Ser  1.10 (H) 0.44 - 1.00 mg/dL   GFR, Estimated >41 >32 mL/min    Comment: (NOTE) Calculated using the CKD-EPI Creatinine Equation (2021) Performed at [facility], [address] [place], [name] [address]   HIV Antibody (routine testing w rflx)     Status: None   Collection Time: 10/09/22  4:40 AM  Result Value Ref Range   HIV Screen 4th Generation wRfx Non Reactive Non Reactive    Comment: Performed at [facility], [address], [place], [name] [address]  Hemoglobin A1c     Status: Abnormal   Collection Time: 10/09/22  4:40 AM  Result Value Ref Range   Hgb A1c MFr Bld 6.3 (H) 4.8 - 5.6 %    Comment: (NOTE) Pre diabetes:          5.7%-6.4%  Diabetes:              >6.4%  Glycemic control for   <7.0% adults with diabetes    Mean Plasma Glucose 134.11 mg/dL    Comment: Performed at [facility], [address], [place], [name] [address]  CBG monitoring, ED     Status: Abnormal   Collection Time: 10/09/22  5:48 AM  Result Value Ref Range   Glucose-Capillary 202 (H) 70 - 99 mg/dL    Comment: Glucose reference range applies only to samples taken after fasting for at least 8 hours.  CBG monitoring, ED     Status: Abnormal   Collection Time: 10/09/22  7:57 AM  Result Value Ref Range   Glucose-Capillary 225 (H) 70 - 99 mg/dL    Comment: Glucose reference range applies only to samples taken after fasting for at least 8 hours.  SARS Coronavirus 2 by RT PCR (hospital order, performed in [facility] hospital lab) *cepheid single result test* Anterior Nasal Swab     Status: None   Collection Time: 10/09/22  8:22 AM   Specimen: Anterior Nasal Swab  Result Value Ref Range   SARS Coronavirus 2 by RT PCR  NEGATIVE NEGATIVE    Comment: (NOTE) SARS-CoV-2 target nucleic acids are NOT DETECTED.  The SARS-CoV-2 RNA is generally detectable in upper and lower respiratory specimens during the acute phase of infection. The lowest concentration of SARS-CoV-2 viral copies  this assay can detect is 250 copies / mL. A negative result does not preclude SARS-CoV-2 infection and should not be used as the sole basis for treatment or other patient management decisions.  A negative result may occur with improper specimen collection / handling, submission of specimen other than nasopharyngeal swab, presence of viral mutation(s) within the areas targeted by this assay, and inadequate number of viral copies (<250 copies / mL). A negative result must be combined with clinical observations, patient history, and epidemiological information.  Fact Sheet for Patients:   RoadLapTop.co.za  Fact Sheet for Healthcare Providers: http://kim-miller.com/  This test is not yet approved or  cleared by the [place] FDA and has been authorized for detection and/or diagnosis of SARS-CoV-2 by FDA under an Emergency Use Authorization (EUA).  This EUA will remain in effect (meaning this test can be used) for the duration of the COVID-19 declaration under Section 564(b)(1) of the Act, 21 U.S.C. section 360bbb-3(b)(1), unless the authorization is terminated or revoked sooner.  Performed at [facility], [address] [place], [name] [address]   Gastrointestinal Panel by PCR , Stool     Status: None   Collection Time: 10/09/22 10:52 AM   Specimen: Stool  Result Value Ref Range   Campylobacter species NOT DETECTED NOT DETECTED   Plesimonas shigelloides NOT DETECTED NOT DETECTED   Salmonella species NOT DETECTED NOT DETECTED   Yersinia enterocolitica NOT DETECTED NOT DETECTED   Vibrio species NOT DETECTED NOT DETECTED   Vibrio cholerae NOT DETECTED NOT DETECTED   Enteroaggregative E coli (EAEC) NOT DETECTED NOT DETECTED   Enteropathogenic E coli (EPEC) NOT DETECTED NOT DETECTED   Enterotoxigenic E coli (ETEC) NOT DETECTED NOT DETECTED   Shiga like toxin producing E coli (STEC) NOT DETECTED NOT DETECTED    Shigella/Enteroinvasive E coli (EIEC) NOT DETECTED NOT DETECTED   Cryptosporidium NOT DETECTED NOT DETECTED   Cyclospora cayetanensis NOT DETECTED NOT DETECTED   Entamoeba histolytica NOT DETECTED NOT DETECTED   Giardia lamblia NOT DETECTED NOT DETECTED   Adenovirus F40/41 NOT DETECTED NOT DETECTED   Astrovirus NOT DETECTED NOT DETECTED   Norovirus GI/GII NOT DETECTED NOT DETECTED   Rotavirus A NOT DETECTED NOT DETECTED   Sapovirus (I, II, IV, and V) NOT DETECTED NOT DETECTED    Comment: Performed at [facility], [address] [place], [name] [address]  C Difficile Quick Screen w PCR reflex     Status: None   Collection Time: 10/09/22 10:52 AM   Specimen: Stool  Result Value Ref Range   C Diff antigen NEGATIVE NEGATIVE   C Diff toxin NEGATIVE NEGATIVE   C Diff interpretation No C. difficile detected.     Comment: Performed at [facility], [address] [place], [name] [address]  CBG monitoring, ED     Status: Abnormal   Collection Time: 10/09/22 12:35 PM  Result Value Ref Range   Glucose-Capillary 134 (H) 70 - 99 mg/dL    Comment: Glucose reference range applies only to samples taken after fasting for at least 8 hours.  CBG monitoring, ED     Status: Abnormal   Collection Time: 10/09/22  4:32 PM  Result Value Ref Range   Glucose-Capillary 130 (H) 70 - 99  mg/dL    Comment: Glucose reference range applies only to samples taken after fasting for at least 8 hours.  Glucose, capillary     Status: Abnormal   Collection Time: 10/09/22  9:17 PM  Result Value Ref Range   Glucose-Capillary 131 (H) 70 - 99 mg/dL    Comment: Glucose reference range applies only to samples taken after fasting for at least 8 hours.  CBC     Status: Abnormal   Collection Time: 10/10/22  5:10 AM  Result Value Ref Range   WBC 8.6 4.0 - 10.5 K/uL   RBC 3.23 (L) 3.87 - 5.11 MIL/uL   Hemoglobin 9.4 (L) 12.0 - 15.0 g/dL   HCT 16.1 (L) 09.6 - 04.5 %   MCV 93.5 80.0 - 100.0 fL   MCH  29.1 26.0 - 34.0 pg   MCHC 31.1 30.0 - 36.0 g/dL   RDW 40.9 81.1 - 91.4 %   Platelets 224 150 - 400 K/uL   nRBC 0.0 0.0 - 0.2 %    Comment: Performed at [facility], [address], [place], [name] [address]  Basic metabolic panel     Status: Abnormal   Collection Time: 10/10/22  5:11 AM  Result Value Ref Range   Sodium 142 135 - 145 mmol/L   Potassium 3.5 3.5 - 5.1 mmol/L   Chloride 111 98 - 111 mmol/L   CO2 22 22 - 32 mmol/L   Glucose, Bld 129 (H) 70 - 99 mg/dL    Comment: Glucose reference range applies only to samples taken after fasting for at least 8 hours.   BUN 11 6 - 20 mg/dL   Creatinine, Ser 6.21 0.44 - 1.00 mg/dL   Calcium 7.9 (L) 8.9 - 10.3 mg/dL   GFR, Estimated >30 >86 mL/min    Comment: (NOTE) Calculated using the CKD-EPI Creatinine Equation (2021)    Anion gap 9 5 - 15    Comment: Performed at [facility], [address] [place], [name] [address]  Glucose, capillary     Status: Abnormal   Collection Time: 10/10/22  9:02 AM  Result Value Ref Range   Glucose-Capillary 150 (H) 70 - 99 mg/dL    Comment: Glucose reference range applies only to samples taken after fasting for at least 8 hours.  Urinalysis, w/ Reflex to Culture (Infection Suspected) -Urine, Clean Catch     Status: Abnormal   Collection Time: 10/10/22 11:45 AM  Result Value Ref Range   Specimen Source URINE, RANDOM     Comment: CORRECTED ON 06/11 AT 1151: PREVIOUSLY REPORTED AS URINE, CLEAN CATCH   Color, Urine STRAW (A) YELLOW   APPearance CLEAR (A) CLEAR   Specific Gravity, Urine 1.006 1.005 - 1.030   pH 5.0 5.0 - 8.0   Glucose, UA NEGATIVE NEGATIVE mg/dL   Hgb urine dipstick SMALL (A) NEGATIVE   Bilirubin Urine NEGATIVE NEGATIVE   Ketones, ur NEGATIVE NEGATIVE mg/dL   Protein, ur NEGATIVE NEGATIVE mg/dL   Nitrite NEGATIVE NEGATIVE   Leukocytes,Ua NEGATIVE NEGATIVE   RBC / HPF 0-5 0 - 5 RBC/hpf   WBC, UA 0-5 0 - 5 WBC/hpf    Comment:        Reflex urine culture not  performed if WBC <=10, OR if Squamous epithelial cells >5. If Squamous epithelial cells >5 suggest recollection.    Bacteria, UA NONE SEEN NONE SEEN   Squamous Epithelial / HPF 0-5 0 - 5 /HPF   Mucus PRESENT  Comment: Performed at [facility], [address] [place], [name] [address]  Glucose, capillary     Status: Abnormal   Collection Time: 10/10/22 12:15 PM  Result Value Ref Range   Glucose-Capillary 133 (H) 70 - 99 mg/dL    Comment: Glucose reference range applies only to samples taken after fasting for at least 8 hours.  Glucose, capillary     Status: Abnormal   Collection Time: 10/10/22  5:12 PM  Result Value Ref Range   Glucose-Capillary 118 (H) 70 - 99 mg/dL    Comment: Glucose reference range applies only to samples taken after fasting for at least 8 hours.    Radiology: CT ABDOMEN PELVIS W CONTRAST  Result Date: 10/09/2022 CLINICAL DATA:  lower abd pain, emesis EXAM: CT ABDOMEN AND PELVIS WITH CONTRAST TECHNIQUE: Multidetector CT imaging of the abdomen and pelvis was performed using the standard protocol following bolus administration of intravenous contrast. RADIATION DOSE REDUCTION: This exam was performed according to the departmental dose-optimization program which includes automated exposure control, adjustment of the mA and/or kV according to patient size and/or use of iterative reconstruction technique. CONTRAST:  OMNIPAQUE IOHEXOL 300 MG/ML  SOLN COMPARISON:  None Available. FINDINGS: Lower chest: No acute abnormality. Hepatobiliary: No focal liver abnormality is seen. No gallstones, gallbladder wall thickening, or biliary dilatation. Pancreas: Unremarkable Spleen: Unremarkable Adrenals/Urinary Tract: The adrenal glands are unremarkable. The kidneys are normal. The bladder is decompressed. There is, however, superimposed circumferential bladder wall thickening and a superimposed diffuse infectious or inflammatory cystitis is difficult to exclude. No  perivesicular inflammatory stranding or fluid collections are identified. Stomach/Bowel: Stomach is within normal limits. Appendix appears normal. No evidence of bowel wall thickening, distention, or inflammatory changes. Vascular/Lymphatic: No significant vascular findings are present. No enlarged abdominal or pelvic lymph nodes. Reproductive: Uterus and bilateral adnexa are unremarkable. Other: No abdominal wall hernia or abnormality. No abdominopelvic ascites. Musculoskeletal: No acute or significant osseous findings. IMPRESSION: 1. Circumferential bladder wall thickening, possibly related to underlying cystitis. Correlation with urinalysis and urine culture may be helpful for further evaluation. 2. No other acute intra-abdominal pathology identified. Electronically Signed   By: Helyn Numbers M.D.   On: 10/09/2022 03:35    No results found.  No results found.    Assessment and Plan: Patient Active Problem List   Diagnosis Date Noted   Snoring 12/04/2022   Witnessed episode of apnea 12/04/2022   Sepsis without acute organ dysfunction (HCC) 10/10/2022   Acute gastroenteritis 10/09/2022   Morbid obesity with BMI of 50.0-59.9, adult (HCC) 10/09/2022   Urinary tract infection 10/09/2022   AKI (acute kidney injury) (HCC) 10/09/2022   Hypertension 10/09/2022   Uncontrolled type 2 diabetes mellitus with hyperglycemia, without long-term current use of insulin (HCC) 10/09/2022   Hypothyroidism 10/09/2022   Joint pain 10/09/2022   Behavior concern    Depression    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 Snoring Will obtain PSG   2. Witnessed episode of apnea Patient evaluation suggests high risk of sleep disordered breathing due to witnessed apnea,  snoring, daytime somnolence, gasping. .  Patient has comorbid cardiovascular risk factors including: hypertension  which could be exacerbated by pathologic sleep-disordered breathing.  Suggest: PSG  to assess/treat the patient's sleep disordered breathing. Caregiver will need  to be present for study, and they were informed. She resides in a group home.  The patient was also counselled on weight loss to optimize sleep health.  3. Morbid obesity with BMI of 50.0-59.9, adult (HCC) Obesity Counseling: Had a lengthy discussion regarding patients BMI and weight issues. Patient was instructed on portion control as well as increased activity. Also discussed caloric restrictions with trying to maintain intake less than 2000 Kcal. Discussions were made in accordance with the 5As of weight management. Simple actions such as not eating late and if able to, taking a walk is suggested.     General Counseling: I have discussed the findings of the evaluation and examination with Anylah.  I have also discussed any further diagnostic evaluation thatmay be needed or ordered today. Makailah verbalizes understanding of the findings of todays visit. We also reviewed her medications today and discussed drug interactions and side effects including but not limited excessive drowsiness and altered mental states. We also discussed that there is always a risk not just to her but also people around her. she has been encouraged to call the office with any questions or concerns that should arise related to todays visit.  No orders of the defined types were placed in this encounter.       I have personally obtained a history, evaluated the patient, evaluated pertinent data, formulated the assessment and plan and placed orders.   This patient was seen today by Emmaline Kluver, PA-C in collaboration with Dr. Freda Munro.   Yevonne Pax, MD [facility] Diplomate ABMS Pulmonary and Critical Care Medicine Sleep medicine

## 2023-01-07 ENCOUNTER — Other Ambulatory Visit: Payer: Self-pay

## 2023-01-07 ENCOUNTER — Emergency Department
Admission: EM | Admit: 2023-01-07 | Discharge: 2023-01-10 | Disposition: A | Discharge status: Home / Self Care | Payer: 59 | Attending: Emergency Medicine | Admitting: Emergency Medicine

## 2023-01-07 DIAGNOSIS — R45851 Suicidal ideations: Secondary | ICD-10-CM

## 2023-01-07 DIAGNOSIS — I10 Essential (primary) hypertension: Secondary | ICD-10-CM | POA: Diagnosis not present

## 2023-01-07 DIAGNOSIS — E039 Hypothyroidism, unspecified: Secondary | ICD-10-CM | POA: Insufficient documentation

## 2023-01-07 DIAGNOSIS — F411 Generalized anxiety disorder: Secondary | ICD-10-CM | POA: Diagnosis present

## 2023-01-07 DIAGNOSIS — F39 Unspecified mood [affective] disorder: Secondary | ICD-10-CM | POA: Insufficient documentation

## 2023-01-07 DIAGNOSIS — E119 Type 2 diabetes mellitus without complications: Secondary | ICD-10-CM | POA: Insufficient documentation

## 2023-01-07 DIAGNOSIS — F79 Unspecified intellectual disabilities: Secondary | ICD-10-CM | POA: Diagnosis not present

## 2023-01-07 LAB — URINE DRUG SCREEN, QUALITATIVE (ARMC ONLY)
Amphetamines, Ur Screen: NOT DETECTED
Barbiturates, Ur Screen: NOT DETECTED
Benzodiazepine, Ur Scrn: NOT DETECTED
Cannabinoid 50 Ng, Ur ~~LOC~~: NOT DETECTED
Cocaine Metabolite,Ur ~~LOC~~: NOT DETECTED
MDMA (Ecstasy)Ur Screen: NOT DETECTED
Methadone Scn, Ur: NOT DETECTED
Opiate, Ur Screen: NOT DETECTED
Phencyclidine (PCP) Ur S: NOT DETECTED
Tricyclic, Ur Screen: POSITIVE — AB

## 2023-01-07 LAB — CBC
HCT: 35.7 % — ABNORMAL LOW (ref 36.0–46.0)
Hemoglobin: 11.2 g/dL — ABNORMAL LOW (ref 12.0–15.0)
MCH: 29.4 pg (ref 26.0–34.0)
MCHC: 31.4 g/dL (ref 30.0–36.0)
MCV: 93.7 fL (ref 80.0–100.0)
Platelets: 283 10*3/uL (ref 150–400)
RBC: 3.81 MIL/uL — ABNORMAL LOW (ref 3.87–5.11)
RDW: 12.5 % (ref 11.5–15.5)
WBC: 8 10*3/uL (ref 4.0–10.5)
nRBC: 0 % (ref 0.0–0.2)

## 2023-01-07 LAB — COMPREHENSIVE METABOLIC PANEL
ALT: 18 U/L (ref 0–44)
AST: 18 U/L (ref 15–41)
Albumin: 3.9 g/dL (ref 3.5–5.0)
Alkaline Phosphatase: 82 U/L (ref 38–126)
Anion gap: 9 (ref 5–15)
BUN: 13 mg/dL (ref 6–20)
CO2: 27 mmol/L (ref 22–32)
Calcium: 9.4 mg/dL (ref 8.9–10.3)
Chloride: 103 mmol/L (ref 98–111)
Creatinine, Ser: 0.89 mg/dL (ref 0.44–1.00)
GFR, Estimated: >60 mL/min (ref >60)
Glucose, Bld: 162 mg/dL — ABNORMAL HIGH (ref 70–99)
Potassium: 3.7 mmol/L (ref 3.5–5.1)
Sodium: 139 mmol/L (ref 135–145)
Total Bilirubin: 0.3 mg/dL (ref 0.3–1.2)
Total Protein: 7.8 g/dL (ref 6.5–8.1)

## 2023-01-07 LAB — ACETAMINOPHEN LEVEL: Acetaminophen (Tylenol), Serum: <10 ug/mL — ABNORMAL LOW (ref 10–30)

## 2023-01-07 LAB — SALICYLATE LEVEL: Salicylate Lvl: <7 mg/dL — ABNORMAL LOW (ref 7.0–30.0)

## 2023-01-07 LAB — ETHANOL: Alcohol, Ethyl (B): <10 mg/dL (ref <10)

## 2023-01-07 NOTE — ED Triage Notes (Signed)
Pt arrives accompanied by PD for suicidal ideation. Pt is unhappy at her group home and does not wish to return to the group home. She says she stated to [facility], "If I go back to that group home, I am going to slit my wrist." DSS (her guardian) has been notified. Pt says she has been laying in the street in hopes a car would run over her.

## 2023-01-07 NOTE — BH Assessment (Signed)
Comprehensive Clinical Assessment (CCA) Screening, Triage and Referral Note  01/07/2023 Dawn Lambert [id number]  Chief Complaint:  Patient is a 32 y.o. female presenting to the ED from group home endorsing suicidal ideations with plans to slit her wrist. Patient has a history of intellectual developmental disability, anxiety, hypothyroidism, mood disorder, type 2 diabetes who complains of suicidal ideation.  Patient states that she is unhappy at her group home and does not wish to return there stating that "if I go back to that home, I am going to slit my wrists".  Patient's DSS guardian has been notified.  Patient states she does not want to go back to the group home and says if she does return, that will break a piece a glass from her window and use the glass to slit her wrists.  Visit Diagnosis: Suicidal ideation  Patient Reported Information How did you hear about [name]? Other (Comment) (Group Home)  What Is the Reason for Your Visit/Call Today? Suicidal Ideations  How Long Has This Been Causing You Problems? 1 wk - 1 month  What Do You Feel Would Help You the Most Today? Housing Assistance; Social Support   Have You Recently Had Any Thoughts About Hurting Yourself? Yes  Are You Planning to Commit Suicide/Harm Yourself At This time? Yes   Have you Recently Had Thoughts About Hurting Someone [name]? No  Are You Planning to Harm Someone at This Time? No  Explanation: No data recorded  Have You Used Any Alcohol or Drugs in the Past 24 Hours? No  How Long Ago Did You Use Drugs or Alcohol? No data recorded What Did You Use and How Much? No data recorded  Do You Currently Have a Therapist/Psychiatrist? Yes  Name of Therapist/Psychiatrist: [facility]   Have You Been Recently Discharged From Any Office Practice or Programs? No  Explanation of Discharge From Practice/Program: No data recorded   CCA Screening Triage Referral Assessment Type of Contact:  Face-to-Face  Telemedicine Service Delivery:   Is this Initial or Reassessment?   Date Telepsych consult ordered in [facility]:    Time Telepsych consult ordered in [facility]:    Location of Assessment: [facility] ED  Provider Location: [facility] ED    Collateral Involvement: No data recorded  Does Patient Have a Court Appointed Legal Guardian? No data recorded Name and Contact of Legal Guardian: No data recorded If Minor and Not Living with Parent(s), Who has Custody? No data recorded Is CPS involved or ever been involved? Never  Is APS involved or ever been involved? In the past   Patient Determined To Be At Risk for Harm To Self or Others Based on Review of Patient Reported Information or Presenting Complaint? Yes, for Self-Harm  Method: Plan with intent and identified person  Availability of Means: Has close by  Intent: Intends to cause physical harm but not necessarily death  Notification Required: No data recorded Additional Information for Danger to Others Potential: No data recorded Additional Comments for Danger to Others Potential: No data recorded Are There Guns or Other Weapons in Your Home? No  Types of Guns/Weapons: No data recorded Are These Weapons Safely Secured?                            No data recorded Who Could Verify You Are Able To Have These Secured: No data recorded Do You Have any Outstanding Charges, Pending Court Dates, Parole/Probation? N/A  Contacted To Inform of Risk  of Harm To Self or Others: Guardian/MH POA:   Does Patient Present under Involuntary Commitment? Yes    [place] of Residence: [place]   Patient Currently Receiving the Following Services: Medication Management; Group Home   Determination of Need: Emergent (2 hours)   Options For Referral: ED Referral; Inpatient Hospitalization; Group Home   Discharge Disposition: Patient to be assessed by Psych.    [place], Counselor

## 2023-01-07 NOTE — ED Provider Notes (Signed)
[facility] Provider Note   Event Date/Time   First MD Initiated Contact with Patient 01/07/23 1953     (approximate) History  Suicidal  HPI Dawn Lambert is a 32 y.o. female presents from group home with a history of mental retardation, anxiety, hypothyroidism, mood disorder, type 2 diabetes who complains of suicidal ideation.  Patient states that she is unhappy at her group home and does not wish to return there stating that "if I go back to that home, I am going to slit my wrists".  Patient's DSS guardian has been notified.  Patient states that her plan is to lay in traffic in hopes that she would be run over ROS: Patient currently denies any vision changes, tinnitus, difficulty speaking, facial droop, sore throat, chest pain, shortness of breath, abdominal pain, nausea/vomiting/diarrhea, dysuria, or weakness/numbness/paresthesias in any extremity   Physical Exam  Triage Vital Signs: ED Triage Vitals  Encounter Vitals Group     BP 01/07/23 1703 116/79     Systolic BP Percentile --      Diastolic BP Percentile --      Pulse Rate 01/07/23 1703 74     Resp 01/07/23 1703 19     Temp 01/07/23 1703 98.3 F (36.8 C)     Temp src --      SpO2 01/07/23 1703 100 %     Weight 01/07/23 1700 272 lb (123.4 kg)     Height 01/07/23 1700 5\' 2"  (1.575 m)     Head Circumference --      Peak Flow --      Pain Score 01/07/23 1700 0     Pain Loc --      Pain Education --      Exclude from Growth Chart --    Most recent vital signs: Vitals:   01/07/23 1703  BP: 116/79  Pulse: 74  Resp: 19  Temp: 98.3 F (36.8 C)  SpO2: 100%   General: Awake, cooperative CV:  Good peripheral perfusion.  Resp:  Normal effort.  Abd:  No distention.  Other:  Middle-aged morbidly obese Caucasian female resting comfortably in no acute distress ED Results / Procedures / Treatments  Labs (all labs ordered are listed, but only abnormal results are displayed) Labs Reviewed   COMPREHENSIVE METABOLIC PANEL - Abnormal; Notable for the following components:      Result Value   Glucose, Bld 162 (*)    All other components within normal limits  CBC - Abnormal; Notable for the following components:   RBC 3.81 (*)    Hemoglobin 11.2 (*)    HCT 35.7 (*)    All other components within normal limits  URINE DRUG SCREEN, QUALITATIVE ([facility] ONLY) - Abnormal; Notable for the following components:   Tricyclic, Ur Screen POSITIVE (*)    All other components within normal limits  ACETAMINOPHEN LEVEL - Abnormal; Notable for the following components:   Acetaminophen (Tylenol), Serum <10 (*)    All other components within normal limits  SALICYLATE LEVEL - Abnormal; Notable for the following components:   Salicylate Lvl <7.0 (*)    All other components within normal limits  ETHANOL  POC URINE PREG, ED   PROCEDURES: Critical Care performed: No Procedures MEDICATIONS ORDERED IN ED: Medications - No data to display IMPRESSION / MDM / ASSESSMENT AND PLAN / ED COURSE  I reviewed the triage vital signs and the nursing notes.  The patient is on the cardiac monitor to evaluate for evidence of arrhythmia and/or significant heart rate changes. Patient's presentation is most consistent with acute presentation with potential threat to life or bodily function. Thoughts are linear and organized, and patient has no AH, VH, or HI. Prior suicide attempt: cutting behavior Prior Psychiatric Hospitalizations: none  Clinically patient displays no overt toxidrome; they are well appearing, with low suspicion for toxic ingestion given history and exam. Thoughts unlikely 2/2 anemia, hypothyroidism, infection, or ICH.  Consult: Psychiatry to evaluate patient for potential hold for danger to self. Disposition: Care of this patient will be signed out to the oncoming physician at the end of my shift.  All pertinent patient information conveyed and all questions  answered.  All further care and disposition decisions will be made by the oncoming physician.   FINAL CLINICAL IMPRESSION(S) / ED DIAGNOSES   Final diagnoses:  Suicidal ideation   Rx / DC Orders   ED Discharge Orders     None      Note:  This document was prepared using Dragon voice recognition software and may include unintentional dictation errors.   Merwyn Katos, MD 01/07/23 [name]

## 2023-01-08 DIAGNOSIS — F39 Unspecified mood [affective] disorder: Secondary | ICD-10-CM | POA: Insufficient documentation

## 2023-01-08 DIAGNOSIS — R45851 Suicidal ideations: Secondary | ICD-10-CM

## 2023-01-08 DIAGNOSIS — F411 Generalized anxiety disorder: Secondary | ICD-10-CM | POA: Insufficient documentation

## 2023-01-08 LAB — CBG MONITORING, ED
Glucose-Capillary: 151 mg/dL — ABNORMAL HIGH (ref 70–99)
Glucose-Capillary: 206 mg/dL — ABNORMAL HIGH (ref 70–99)
Glucose-Capillary: 189 mg/dL — ABNORMAL HIGH (ref 70–99)

## 2023-01-08 MED ORDER — FLUTICASONE PROPIONATE 50 MCG/ACT NA SUSP: 1.0000 | Freq: Every day | NASAL | Status: DC | PRN

## 2023-01-08 MED ORDER — HYDROXYZINE HCL 25 MG PO TABS: 25.0000 mg | ORAL_TABLET | Freq: Three times a day (TID) | ORAL | Status: DC | PRN

## 2023-01-08 MED ORDER — LITHIUM CARBONATE 300 MG PO CAPS
600.0000 mg | ORAL_CAPSULE | Freq: Every day | ORAL | Status: DC
  Administered 2023-01-08 – 2023-01-09 (×2): 600 mg via ORAL
  Filled 2023-01-08 (×3): qty 2

## 2023-01-08 MED ORDER — LORAZEPAM 1 MG PO TABS
1.0000 mg | ORAL_TABLET | Freq: Two times a day (BID) | ORAL | Status: DC
  Administered 2023-01-08 – 2023-01-10 (×5): 1 mg via ORAL
  Filled 2023-01-08 (×5): qty 1

## 2023-01-08 MED ORDER — MAGNESIUM HYDROXIDE 400 MG/5ML PO SUSP: 30.0000 mL | Freq: Every evening | ORAL | Status: DC | PRN

## 2023-01-08 MED ORDER — QUETIAPINE FUMARATE 25 MG PO TABS
50.0000 mg | ORAL_TABLET | Freq: Every day | ORAL | Status: DC
  Administered 2023-01-08 – 2023-01-10 (×3): 50 mg via ORAL
  Filled 2023-01-08 (×3): qty 2

## 2023-01-08 MED ORDER — LITHIUM CARBONATE 300 MG PO CAPS: 300.0000 mg | ORAL_CAPSULE | Freq: Two times a day (BID) | ORAL | Status: DC

## 2023-01-08 MED ORDER — ONDANSETRON 4 MG PO TBDP: 8.0000 mg | ORAL_TABLET | Freq: Three times a day (TID) | ORAL | Status: DC | PRN

## 2023-01-08 MED ORDER — LORATADINE 10 MG PO TABS: 10.0000 mg | ORAL_TABLET | Freq: Every day | ORAL | Status: DC | PRN

## 2023-01-08 MED ORDER — GABAPENTIN 400 MG PO CAPS
800.0000 mg | ORAL_CAPSULE | Freq: Three times a day (TID) | ORAL | Status: DC
  Administered 2023-01-08 – 2023-01-09 (×6): 800 mg via ORAL
  Filled 2023-01-08 (×8): qty 2

## 2023-01-08 MED ORDER — DULAGLUTIDE 3 MG/0.5ML ~~LOC~~ SOAJ: 3.0000 mg | SUBCUTANEOUS | Status: DC

## 2023-01-08 MED ORDER — LISINOPRIL 10 MG PO TABS
10.0000 mg | ORAL_TABLET | Freq: Every day | ORAL | Status: DC
  Administered 2023-01-08 – 2023-01-09 (×2): 10 mg via ORAL
  Filled 2023-01-08 (×2): qty 2

## 2023-01-08 MED ORDER — INSULIN ASPART 100 UNIT/ML IJ SOLN
0.0000 [IU] | Freq: Three times a day (TID) | INTRAMUSCULAR | Status: DC
  Administered 2023-01-08: 3 [IU] via SUBCUTANEOUS
  Administered 2023-01-08: 5 [IU] via SUBCUTANEOUS
  Administered 2023-01-09 (×3): 3 [IU] via SUBCUTANEOUS
  Filled 2023-01-08 (×5): qty 1

## 2023-01-08 MED ORDER — CARBAMAZEPINE 200 MG PO TABS
400.0000 mg | ORAL_TABLET | Freq: Two times a day (BID) | ORAL | Status: DC
  Administered 2023-01-08 – 2023-01-09 (×4): 400 mg via ORAL
  Filled 2023-01-08 (×6): qty 2

## 2023-01-08 MED ORDER — LEVOTHYROXINE SODIUM 50 MCG PO TABS
175.0000 ug | ORAL_TABLET | Freq: Every day | ORAL | Status: DC
  Administered 2023-01-08 – 2023-01-10 (×3): 175 ug via ORAL
  Filled 2023-01-08 (×3): qty 1

## 2023-01-08 MED ORDER — ALBUTEROL SULFATE HFA 108 (90 BASE) MCG/ACT IN AERS: 2.0000 | INHALATION_SPRAY | RESPIRATORY_TRACT | Status: DC | PRN

## 2023-01-08 MED ORDER — INSULIN ASPART 100 UNIT/ML IJ SOLN: 0.0000 [IU] | Freq: Every day | INTRAMUSCULAR | Status: DC

## 2023-01-08 MED ORDER — GUAIFENESIN ER 600 MG PO TB12: 1200.0000 mg | ORAL_TABLET | Freq: Every day | ORAL | Status: DC | PRN

## 2023-01-08 MED ORDER — LITHIUM CARBONATE 300 MG PO CAPS
300.0000 mg | ORAL_CAPSULE | Freq: Every day | ORAL | Status: DC
  Administered 2023-01-08 – 2023-01-10 (×3): 300 mg via ORAL
  Filled 2023-01-08 (×3): qty 1

## 2023-01-08 MED ORDER — NYSTATIN 100000 UNIT/GM EX POWD: 1.0000 | Freq: Two times a day (BID) | CUTANEOUS | Status: DC | PRN

## 2023-01-08 MED ORDER — CLONAZEPAM 0.5 MG PO TABS
1.0000 mg | ORAL_TABLET | Freq: Two times a day (BID) | ORAL | Status: DC
  Administered 2023-01-08 – 2023-01-10 (×5): 1 mg via ORAL
  Filled 2023-01-08 (×5): qty 1

## 2023-01-08 MED ORDER — ACETAMINOPHEN 325 MG PO TABS: 650.0000 mg | ORAL_TABLET | Freq: Four times a day (QID) | ORAL | Status: DC | PRN

## 2023-01-08 MED ORDER — BENZONATATE 100 MG PO CAPS: 100.0000 mg | ORAL_CAPSULE | Freq: Three times a day (TID) | ORAL | Status: DC | PRN

## 2023-01-08 MED ORDER — QUETIAPINE FUMARATE ER 200 MG PO TB24
400.0000 mg | ORAL_TABLET | Freq: Every day | ORAL | Status: DC
  Administered 2023-01-08 – 2023-01-09 (×2): 400 mg via ORAL
  Filled 2023-01-08 (×2): qty 2

## 2023-01-08 MED ORDER — LORAZEPAM ER 2 MG PO CS24: 2.0000 mg | EXTENDED_RELEASE_CAPSULE | Freq: Every morning | ORAL | Status: DC

## 2023-01-08 MED ORDER — PROPRANOLOL HCL 20 MG PO TABS
40.0000 mg | ORAL_TABLET | Freq: Two times a day (BID) | ORAL | Status: DC
  Administered 2023-01-08 – 2023-01-10 (×5): 40 mg via ORAL
  Filled 2023-01-08 (×5): qty 1

## 2023-01-08 MED ORDER — MELOXICAM 7.5 MG PO TABS
15.0000 mg | ORAL_TABLET | Freq: Every day | ORAL | Status: DC
  Administered 2023-01-08 – 2023-01-09 (×2): 15 mg via ORAL
  Filled 2023-01-08 (×3): qty 2

## 2023-01-08 NOTE — ED Notes (Signed)
Pt beating fist on window glass in room. When asked why, pt stated that she was trying to slit her wrists. When asked why, pt says that if she has to go back to group home she will cut her wrists. I asked pt to stop since we did not know if she was going back to [facility] and she agreed. No bruising or redness noted on fist.

## 2023-01-08 NOTE — ED Notes (Signed)
Patient observed on camera, appears to be banging head on wall.  Into check on patient, patient is standing in door way and states "I'm banging my head on the wall".  When questioned why states "I want to bang my head up and when I go home I'm going to cut my wrist".  Informed patient that she would be speaking with psychiatry later today and patient replied "ok".  Patient stated she would quit banging her head and would lay back down.

## 2023-01-08 NOTE — ED Notes (Signed)
This RN saw patient banging her head on wall and went into room to assess patient. Attempted to redirect patient but patient refused and stated " I am going to keep hitting my head on the wall" . No redness or injury noted on patient. Provider made aware of situation.

## 2023-01-08 NOTE — ED Provider Notes (Signed)
[facility] rounding  Patient is noted to be resting in bed comfortably without distress, sleeping.  He is currently under IVC.  Awaiting completion of psychiatry consult.  Vitals:   01/07/23 1703 01/07/23 2150  BP: 116/79 119/61  Pulse: 74 72  Resp: 19 16  Temp: 98.3 F (36.8 C) 98.1 F (36.7 C)  SpO2: 100% 98%      Sharyn Creamer, MD 01/08/23 [phone]

## 2023-01-08 NOTE — ED Notes (Signed)
Patient requested a shower and was given the appropriate items for said shower. Patient also requested to change bed linens and was given clean linens in exchange for the dirty linens.

## 2023-01-08 NOTE — TOC CM/SW Note (Signed)
Notes from this admission state that DSS is patient's legal guardian. CSW contact [facility] APS supervisor who stated they are not working with this adult and are not her legal guardian. Per chart review from previous admissions, patient's mother is her legal guardian.  Charlynn Court, CSW [phone]

## 2023-01-08 NOTE — ED Notes (Signed)
IVC/pending psych consult 

## 2023-01-08 NOTE — ED Notes (Signed)
IVC PENDING  CONSULT ?

## 2023-01-08 NOTE — BH Assessment (Signed)
Psych Team spoke to Clinical Director, Dr. Elio Forget [phone]. He reports patient is acting out because she was not able to attend a particular event during the group outing. He says patient presents with this type of behavior from time to time which is generally managed at the group home; however, patient walked away from staff stating she wanted to come to the ED. Patient has been a resident at this facility for over a decade. Dr. Cheree Ditto says he does not have any safety concerns and patient is able to return to the group home. He also mentioned that patient has an upcoming appointment with her psychiatrist on Wednesday in which she will be receiving her shot at that time.  Per Christal, NP, patient is recommended for overnight observation to be reassessed in the morning.

## 2023-01-08 NOTE — Inpatient Diabetes Management (Signed)
Inpatient Diabetes Program Recommendations  AACE/ADA: New Consensus Statement on Inpatient Glycemic Control   Target Ranges:  Prepandial:   less than 140 mg/dL      Peak postprandial:   less than 180 mg/dL (1-2 hours)      Critically ill patients:  140 - 180 mg/dL    Latest Reference Range & Units 01/07/23 17:06  Glucose 70 - 99 mg/dL 865 (H)  (H): Data is abnormally high  Review of Glycemic Control  Diabetes history: DM2 Outpatient Diabetes medications: Trulicity 3mg  Qweek Current orders for Inpatient glycemic control: None  Inpatient Diabetes Program Recommendations:    Insulin: While holding in ED, please consider ordering CBGs AC&HS and Novolog 0-15 units TID with meals and Novolog 0-5 units at bedtime.  Thanks, Orlando Penner, RN, MSN, CDCES Diabetes Coordinator Inpatient Diabetes Program [phone] (Team Pager from 8am to 5pm)

## 2023-01-08 NOTE — Consult Note (Cosign Needed Addendum)
[facility] Face-to-Face Psychiatry Consult   Reason for Consult: Psychiatric Evaluation  Referring Physician:  Merwyn Katos, MD Patient Identification: Dawn Lambert MRN:  [id number] Principal Diagnosis: Suicidal ideation Diagnosis:  Principal Problem:   Suicidal ideation Active Problems:   Intellectual disability   Generalized anxiety disorder   Episodic mood disorder (HCC)   Total Time spent with patient: 45 minutes  Subjective:   Dawn Lambert is a 32 y.o. female patient with history intellectual disability, anxiety, mood disorder arrived to the emergency department accompanied by law enforcement from her care home with suicidal ideation with a plan and intent to lay in the street.   HPI:  Patient seen face to face by this provider, consulted with attending psychiatrist M. Marval Regal and emergency department physician M. Quale; and chart reviewed on 01/08/23.  On evaluation, she endorses active suicidal ideation and a plan to bust out a window and slit her wrist to bleed to death if she returns to the group home. She reports laying in the street yesterday with intentions of getting hit by a car and dying. She also endorses homicidal ideation towards staff at her group home, with a plan to cut them. Dawn Lambert identifies her stressors and triggers as being related to the group home environment.  She reports a history of self-injurious behaviors, including cutting her right wrist with a knife two months ago and cutting her left wrist with scissors one week ago. She exhibits a reddened area around her left wrist. Dawn Lambert endorses depressive symptoms such as irritability and hopelessness, sleep difficulties, and a recent onset of night terrors. She reports her appetite as being pretty good. Dawn Lambert experiences auditory hallucinations of a command type, hearing voices telling her to go home and slit her wrists so she could die. She denies a history of trauma or abuse. She is unable to provide her mental  health history, or any previous inpatient psychiatric treatment. She is also unable to provide information about her outpatient psychiatry services but mentions receiving virtual counseling services with her last appointment being on Tuesday.  Lyrical denies access to firearms, illicit substance use, or alcohol use. She states that she has been residing at the group home for 15 years and reports negative experiences with the staff. Dawn Lambert believes the group home staff will not tell the truth about her situation if were to contact them. UDS positive for tricyclics. BAL is unremarkable. PDMP Review revealed no active prescription.   During the evaluation, she is alert and oriented, sitting up in bed, dressed in hospital scrubs. She  answers questions appropriately, no preoccupation or distractibility observed, does not appear to be responding to internal or external stimuli.    Collateral per D. Willeen Cass, TTS Counselor:   Psych Team spoke to Clinical Director, Dr. Elio Forget [phone]. He reports patient is acting out because she was not able to attend a particular event during the group outing. He says patient presents with this type of behavior from time to time which is generally managed at the group home; however, patient walked away from staff stating she wanted to come to the ED. Patient has been a resident at this facility for over a decade. Dr. Cheree Ditto says he does not have any safety concerns and patient is able to return to the group home. He also mentioned that patient has an upcoming appointment with her psychiatrist on Wednesday in which she will be receiving her shot at that time.   Disposition: Patient will continue observation  in the emergency department overnight to allow her time to de-escalate feelings toward group home staff members, psychiatry to reassess in the morning.   Past Psychiatric History: Anxiety, Mood disorder (HCC)   Risk to Self:   Risk to Others:   Prior Inpatient  Therapy:   Prior Outpatient Therapy:    Past Medical History:  Past Medical History:  Diagnosis Date   Anxiety    Arnold-Chiari malformation (HCC)    Atrial septal defect    Complication of anesthesia    when pt was 11, woke up during surgery (muscle biopsy)   Constipation    Contraception management    Dandruff    Dermatomyositis (HCC)    age 72   Diabetes mellitus without complication (HCC)    Elevated blood pressure    Hypertension    Hypothyroidism    Mental retardation    Mood disorder (HCC)    Overweight(278.02)    Pelvic pain    Prediabetes     Past Surgical History:  Procedure Laterality Date   ASD REPAIR     at age 70   UMBILICAL HERNIA REPAIR N/A 09/17/2017   Procedure: HERNIA REPAIR UMBILICAL ADULT;  Surgeon: Earline Mayotte, MD;  Location: [facility] ORS;  Service: General;  Laterality: N/A;   Family History: No family history on file. Family Psychiatric  History: None reported  Social History:  Social History   Substance and Sexual Activity  Alcohol Use No     Social History   Substance and Sexual Activity  Drug Use No    Social History   Socioeconomic History   Marital status: Single    Spouse name: Not on file   Number of children: Not on file   Years of education: Not on file   Highest education level: Not on file  Occupational History   Not on file  Tobacco Use   Smoking status: Never   Smokeless tobacco: Never  Vaping Use   Vaping status: Never Used  Substance and Sexual Activity   Alcohol use: No   Drug use: No   Sexual activity: Not on file  Other Topics Concern   Not on file  Social History Narrative   Not on file   Social Determinants of Health   Financial Resource Strain: Not on file  Food Insecurity: No Food Insecurity (10/09/2022)   Hunger Vital Sign    Worried About Running Out of Food in the Last Year: Never true    Ran Out of Food in the Last Year: Never true  Transportation Needs: No Transportation Needs (10/09/2022)    PRAPARE - Administrator, Civil Service (Medical): No    Lack of Transportation (Non-Medical): No  Physical Activity: Not on file  Stress: Not on file  Social Connections: Not on file   Additional Social History:    Allergies:   Allergies  Allergen Reactions   Amoxicillin-Pot Clavulanate Other (See Comments)    Has patient had a PCN reaction causing immediate rash, facial/tongue/throat swelling, SOB or lightheadedness with hypotension: ______  Has patient had a PCN reaction causing severe rash involving mucus membranes or skin necrosis: _______  Has patient had a PCN reaction that required hospitalization: _______  Has patient had a PCN reaction occurring within the last 10 years:_______  If all of the above answers are "NO", then may proceed with Cephalosporin use.  Other Reaction(s): Unknown  Other reaction(s): Other (See Comments)  Has patient had a PCN reaction causing immediate rash,  facial/tongue/throat swelling, SOB or lightheadedness with hypotension: ______  Has patient had a PCN reaction causing severe rash involving mucus membranes or skin necrosis: _______  Has patient had a PCN reaction that required hospitalization: _______  Has patient had a PCN reaction occurring within the last 10 years:_______  If all of the above answers are "NO", then may proceed with Cephalosporin use.    Labs:  Results for orders placed or performed during the hospital encounter of 01/07/23 (from the past 48 hour(s))  Comprehensive metabolic panel     Status: Abnormal   Collection Time: 01/07/23  5:06 PM  Result Value Ref Range   Sodium 139 135 - 145 mmol/L   Potassium 3.7 3.5 - 5.1 mmol/L   Chloride 103 98 - 111 mmol/L   CO2 27 22 - 32 mmol/L   Glucose, Bld 162 (H) 70 - 99 mg/dL    Comment: Glucose reference range applies only to samples taken after fasting for at least 8 hours.   BUN 13 6 - 20 mg/dL   Creatinine, Ser 4.09 0.44 - 1.00 mg/dL   Calcium 9.4 8.9 - 81.1  mg/dL   Total Protein 7.8 6.5 - 8.1 g/dL   Albumin 3.9 3.5 - 5.0 g/dL   AST 18 15 - 41 U/L   ALT 18 0 - 44 U/L   Alkaline Phosphatase 82 38 - 126 U/L   Total Bilirubin 0.3 0.3 - 1.2 mg/dL   GFR, Estimated >91 >47 mL/min    Comment: (NOTE) Calculated using the CKD-EPI Creatinine Equation (2021)    Anion gap 9 5 - 15    Comment: Performed at [facility], [address] [place], [name] [address]  cbc     Status: Abnormal   Collection Time: 01/07/23  5:06 PM  Result Value Ref Range   WBC 8.0 4.0 - 10.5 K/uL   RBC 3.81 (L) 3.87 - 5.11 MIL/uL   Hemoglobin 11.2 (L) 12.0 - 15.0 g/dL   HCT 21.3 (L) 08.6 - 57.8 %   MCV 93.7 80.0 - 100.0 fL   MCH 29.4 26.0 - 34.0 pg   MCHC 31.4 30.0 - 36.0 g/dL   RDW 46.9 62.9 - 52.8 %   Platelets 283 150 - 400 K/uL   nRBC 0.0 0.0 - 0.2 %    Comment: Performed at [facility], [address] [place], [name] [address]  Urine Drug Screen, Qualitative     Status: Abnormal   Collection Time: 01/07/23  5:06 PM  Result Value Ref Range   Tricyclic, Ur Screen POSITIVE (A) NONE DETECTED   Amphetamines, Ur Screen NONE DETECTED NONE DETECTED   MDMA (Ecstasy)Ur Screen NONE DETECTED NONE DETECTED   Cocaine Metabolite,Ur [place] NONE DETECTED NONE DETECTED   Opiate, Ur Screen NONE DETECTED NONE DETECTED   Phencyclidine (PCP) Ur S NONE DETECTED NONE DETECTED   Cannabinoid 50 Ng, Ur [place] NONE DETECTED NONE DETECTED   Barbiturates, Ur Screen NONE DETECTED NONE DETECTED   Benzodiazepine, Ur Scrn NONE DETECTED NONE DETECTED   Methadone Scn, Ur NONE DETECTED NONE DETECTED    Comment: (NOTE) Tricyclics + metabolites, urine    Cutoff 1000 ng/mL Amphetamines + metabolites, urine  Cutoff 1000 ng/mL MDMA (Ecstasy), urine              Cutoff 500 ng/mL Cocaine Metabolite, urine          Cutoff 300 ng/mL Opiate + metabolites, urine        Cutoff 300 ng/mL Phencyclidine (  PCP), urine         Cutoff 25 ng/mL Cannabinoid, urine                 Cutoff 50  ng/mL Barbiturates + metabolites, urine  Cutoff 200 ng/mL Benzodiazepine, urine              Cutoff 200 ng/mL Methadone, urine                   Cutoff 300 ng/mL  The urine drug screen provides only a preliminary, unconfirmed analytical test result and should not be used for non-medical purposes. Clinical consideration and professional judgment should be applied to any positive drug screen result due to possible interfering substances. A more specific alternate chemical method must be used in order to obtain a confirmed analytical result. Gas chromatography / mass spectrometry (GC/MS) is the preferred confirm atory method. Performed at [facility], [address] [place], [name] [address]   Acetaminophen level     Status: Abnormal   Collection Time: 01/07/23  6:15 PM  Result Value Ref Range   Acetaminophen (Tylenol), Serum <10 (L) 10 - 30 ug/mL    Comment: (NOTE) Therapeutic concentrations vary significantly. A range of 10-30 ug/mL  may be an effective concentration for many patients. However, some  are best treated at concentrations outside of this range. Acetaminophen concentrations >150 ug/mL at 4 hours after ingestion  and >50 ug/mL at 12 hours after ingestion are often associated with  toxic reactions.  Performed at [facility], [address] [place], [name] [address]   Ethanol     Status: None   Collection Time: 01/07/23  6:15 PM  Result Value Ref Range   Alcohol, Ethyl (B) <10 <10 mg/dL    Comment: (NOTE) Lowest detectable limit for serum alcohol is 10 mg/dL.  For medical purposes only. Performed at [facility], [address] [place], [name] [address]   Salicylate level     Status: Abnormal   Collection Time: 01/07/23  6:15 PM  Result Value Ref Range   Salicylate Lvl <7.0 (L) 7.0 - 30.0 mg/dL    Comment: Performed at [facility], [address] [place], [name] [address]  CBG monitoring, ED     Status: Abnormal    Collection Time: 01/08/23 11:22 AM  Result Value Ref Range   Glucose-Capillary 151 (H) 70 - 99 mg/dL    Comment: Glucose reference range applies only to samples taken after fasting for at least 8 hours.  CBG monitoring, ED     Status: Abnormal   Collection Time: 01/08/23  4:13 PM  Result Value Ref Range   Glucose-Capillary 206 (H) 70 - 99 mg/dL    Comment: Glucose reference range applies only to samples taken after fasting for at least 8 hours.    Current Facility-Administered Medications  Medication Dose Route Frequency Provider Last Rate Last Admin   acetaminophen (TYLENOL) tablet 650 mg  650 mg Oral Q6H PRN Ward, Kristen N, DO       albuterol (VENTOLIN HFA) 108 (90 Base) MCG/ACT inhaler 2 puff  2 puff Inhalation Q4H PRN Ward, Kristen N, DO       benzonatate (TESSALON) capsule 100 mg  100 mg Oral TID PRN Ward, Kristen N, DO       carbamazepine (TEGRETOL) tablet 400 mg  400 mg Oral BID WC Ward, Kristen N, DO   400 mg at 01/08/23 1625   clonazePAM (KLONOPIN) tablet 1 mg  1 mg Oral BID Ward, Kristen N, DO   1 mg at 01/08/23 1100   fluticasone (FLONASE) 50 MCG/ACT nasal spray 1 spray  1 spray Each Nare Daily PRN Ward, Kristen N, DO       gabapentin (NEURONTIN) capsule 800 mg  800 mg Oral TID Ward, Kristen N, DO   800 mg at 01/08/23 1625   guaiFENesin (MUCINEX) 12 hr tablet 1,200 mg  1,200 mg Oral Daily PRN Ward, Kristen N, DO       hydrOXYzine (ATARAX) tablet 25 mg  25 mg Oral TID PRN Paislyn Domenico H, NP       insulin aspart (novoLOG) injection 0-15 Units  0-15 Units Subcutaneous TID WC Sharyn Creamer, MD   3 Units at 01/08/23 1140   insulin aspart (novoLOG) injection 0-5 Units  0-5 Units Subcutaneous QHS Sharyn Creamer, MD       levothyroxine (SYNTHROID) tablet 175 mcg  175 mcg Oral Q0600 Ward, Layla Maw, DO   175 mcg at 01/08/23 0745   lisinopril (ZESTRIL) tablet 10 mg  10 mg Oral QHS Ward, Kristen N, DO       lithium carbonate capsule 300 mg  300 mg Oral Q breakfast Otelia Sergeant, RPH    300 mg at 01/08/23 1610   And   lithium carbonate capsule 600 mg  600 mg Oral Q supper Otelia Sergeant, RPH   600 mg at 01/08/23 1625   loratadine (CLARITIN) tablet 10 mg  10 mg Oral Daily PRN Ward, Kristen N, DO       LORazepam (ATIVAN) tablet 1 mg  1 mg Oral BID Otelia Sergeant, RPH   1 mg at 01/08/23 1059   magnesium hydroxide (MILK OF MAGNESIA) suspension 30 mL  30 mL Oral QHS PRN Ward, Layla Maw, DO       meloxicam (MOBIC) tablet 15 mg  15 mg Oral Daily Ward, Kristen N, DO   15 mg at 01/08/23 1100   nystatin (MYCOSTATIN/NYSTOP) topical powder 1 Application  1 Application Topical BID PRN Ward, Kristen N, DO       ondansetron (ZOFRAN-ODT) disintegrating tablet 8 mg  8 mg Oral Q8H PRN Ward, Kristen N, DO       propranolol (INDERAL) tablet 40 mg  40 mg Oral BID Ward, Kristen N, DO   40 mg at 01/08/23 0746   QUEtiapine (SEROQUEL XR) 24 hr tablet 400 mg  400 mg Oral QHS Ward, Kristen N, DO       QUEtiapine (SEROQUEL) tablet 50 mg  50 mg Oral Daily Ward, Kristen N, DO   50 mg at 01/08/23 1059   Current Outpatient Medications  Medication Sig Dispense Refill   acetaminophen (TYLENOL) 325 MG tablet Take 650 mg by mouth every 6 (six) hours as needed for mild pain or moderate pain.     albuterol (VENTOLIN HFA) 108 (90 Base) MCG/ACT inhaler Inhale 2 puffs into the lungs every 4 (four) hours as needed.     benzonatate (TESSALON) 100 MG capsule Take 100 mg by mouth 3 (three) times daily as needed for cough.     carbamazepine (TEGRETOL) 200 MG tablet Take 400 mg by mouth 2 (two) times daily. With food     cetirizine (ZYRTEC) 10 MG tablet Take 10 mg by mouth daily as needed for allergies.     cholecalciferol (VITAMIN D) 1000 units tablet Take 1,000 Units by mouth daily.      clonazePAM (KLONOPIN) 1 MG tablet Take 1 mg by mouth  2 (two) times daily.     Fluocinolone Acetonide Scalp 0.01 % OIL Apply to aa's scalp QD on Monday, Wednesday and Friday x 6 weeks. After 6 wks decrease to QW on Friday. May increase  to QD on Monday, Wednesday, and Friday during times of flares. 118.28 mL 11   fluticasone (FLONASE) 50 MCG/ACT nasal spray Place 1 spray into both nostrils daily as needed for allergies or rhinitis.     gabapentin (NEURONTIN) 800 MG tablet Take 800 mg by mouth 3 (three) times daily.     Guaifenesin 1200 MG TB12 Take 1,200 mg by mouth daily as needed (Congestion).     ibuprofen (ADVIL) 600 MG tablet Take 600 mg by mouth every 6 (six) hours as needed.     INVEGA TRINZA 819 MG/2.63ML injection Inject 819 mg into the muscle every 3 (three) months.     levothyroxine (SYNTHROID, LEVOTHROID) 175 MCG tablet Take 175 mcg by mouth daily before breakfast.     lidocaine (XYLOCAINE) 2 % solution Use as directed 15 mLs in the mouth or throat as needed for mouth pain.     lisinopril (ZESTRIL) 10 MG tablet Take 10 mg by mouth at bedtime.     lithium carbonate 300 MG capsule Take 300-600 mg by mouth 2 (two) times daily. Take 300 mg by mouth every morning 600 mg At bedtime     LORazepam ER (LOREEV XR) 2 MG CS24 Take 2 mg by mouth in the morning.     magnesium hydroxide (MILK OF MAGNESIA) 400 MG/5ML suspension Take 30 mLs by mouth at bedtime as needed for mild constipation.     meloxicam (MOBIC) 15 MG tablet Take 1 tablet by mouth daily.     Multiple Vitamin (THEREMS PO) Take 1 tablet by mouth daily. Take with food     nystatin powder Apply 1 Application topically 2 (two) times daily as needed (fungal infection). Apply under breast     ondansetron (ZOFRAN-ODT) 8 MG disintegrating tablet Take 8 mg by mouth every 8 (eight) hours as needed.     propranolol (INDERAL) 40 MG tablet Take 40 mg by mouth 2 (two) times daily.     QUEtiapine (SEROQUEL XR) 400 MG 24 hr tablet Take 400 mg by mouth at bedtime.     QUEtiapine (SEROQUEL) 50 MG tablet Take 50 mg by mouth daily. At 2pm.     TRULICITY 3 MG/0.5ML SOPN Inject 3 mg into the skin once a week. Wednesday      Musculoskeletal: Strength & Muscle Tone: within normal  limits Gait & Station:  Did not assess  Patient leans: N/A            Psychiatric Specialty Exam:  Presentation  General Appearance:  Appropriate for Environment  Eye Contact: Good  Speech: Clear and Coherent  Speech Volume: Normal  Handedness: Right   Mood and Affect  Mood: Euthymic  Affect: Congruent   Thought Process  Thought Processes: Coherent  Descriptions of Associations:Intact  Orientation:Full (Time, Place and Person)  Thought Content:Perseveration  History of Schizophrenia/Schizoaffective disorder:No data recorded Duration of Psychotic Symptoms:No data recorded Hallucinations:Hallucinations: None  Ideas of Reference:None  Suicidal Thoughts:Suicidal Thoughts: Yes, Active SI Active Intent and/or Plan: With Intent; With Plan  Homicidal Thoughts:Homicidal Thoughts: No   Sensorium  Memory: Immediate Good; Recent Fair  Judgment: Impaired  Insight: Shallow   Executive Functions  Concentration: Fair  Attention Span: Fair  Recall: Good  Fund of Knowledge: Fair  Language: Good   Psychomotor Activity  Psychomotor Activity:Psychomotor Activity: Normal   Assets  Assets: Desire for Improvement; Housing; Physical Health; Resilience   Sleep  Sleep:Sleep: Fair   Physical Exam: Physical Exam Vitals and nursing note reviewed.  Constitutional:      General: She is not in acute distress. HENT:     Head: Normocephalic.     Nose: Nose normal.  Cardiovascular:     Pulses: Normal pulses.  Pulmonary:     Effort: Pulmonary effort is normal. No respiratory distress.  Musculoskeletal:        General: Normal range of motion.     Cervical back: Normal range of motion.  Neurological:     Mental Status: She is alert and oriented to person, place, and time.    Review of Systems  Respiratory:  Negative for shortness of breath.   Psychiatric/Behavioral:  Positive for suicidal ideas.   All other systems reviewed and  are negative.  Blood pressure (!) 145/90, pulse 65, temperature 99.1 F (37.3 C), temperature source Oral, resp. rate 16, height 5\' 2"  (1.575 m), weight 123.4 kg, SpO2 98%. Body mass index is 49.75 kg/m.  Treatment Plan Summary: Daily contact with patient to assess and evaluate symptoms and progress in treatment, Medication management, and Plan :   Continue observation in the emergency department overnight. Psychiatry to reassess in the morning.  Medication Management: -Home medications were restarted by the ED physician  -Start Hydroxyzine 50 mg three times daily as needed to manage anxiety symptoms   Lab:  -Lithium Level ordered to be drawn tomorrow morning  Disposition: Patient will continue observation in the emergency department overnight to allow her time to de-escalate feelings toward group home staff members, psychiatry to reassess in the morning. Supportive therapy provided about ongoing stressors.  Norma Fredrickson, NP 01/08/2023 5:03 PM

## 2023-01-09 DIAGNOSIS — F411 Generalized anxiety disorder: Secondary | ICD-10-CM | POA: Diagnosis not present

## 2023-01-09 LAB — CBG MONITORING, ED
Glucose-Capillary: 177 mg/dL — ABNORMAL HIGH (ref 70–99)
Glucose-Capillary: 198 mg/dL — ABNORMAL HIGH (ref 70–99)
Glucose-Capillary: 151 mg/dL — ABNORMAL HIGH (ref 70–99)
Glucose-Capillary: 170 mg/dL — ABNORMAL HIGH (ref 70–99)

## 2023-01-09 MED ORDER — PALIPERIDONE PALMITATE ER 819 MG/2.63ML IM SUSY
819.0000 mg | PREFILLED_SYRINGE | INTRAMUSCULAR | Status: DC
  Administered 2023-01-09: 819 mg via INTRAMUSCULAR
  Filled 2023-01-09: qty 2.63

## 2023-01-09 NOTE — ED Notes (Signed)
Notified pt guardian, [place], [place] - [phone],  of discharge 01-10-2023 @ 1000. Guardian is agreeable.

## 2023-01-09 NOTE — Consult Note (Cosign Needed Addendum)
[facility] Face-to-Face Psychiatry Consult Reassessment   Reason for Consult: Psychiatric Evaluation  Referring Physician:  Merwyn Katos, MD Patient Identification: [name] MRN:  [id number] Principal Diagnosis: Suicidal ideation Diagnosis:  Principal Problem:   Suicidal ideation Active Problems:   Intellectual disability   Generalized anxiety disorder   Episodic mood disorder (HCC)   Total Time spent with patient: 45 minutes HPI:  Dawn Lambert is a 32 y.o. female patient with history intellectual disability, anxiety, mood disorder arrived to the emergency department accompanied by law enforcement from her care home with suicidal ideation with a plan and intent to lay in the street.   09/09 - Patient seen face to face by this provider, consulted with attending psychiatrist M. Marval Regal and emergency department physician M. Quale; and chart reviewed on 01/08/23.  On evaluation, she endorses active suicidal ideation and a plan to bust out a window and slit her wrist to bleed to death if she returns to the group home. She reports laying in the street yesterday with intentions of getting hit by a car and dying. She also endorses homicidal ideation towards staff at her group home, with a plan to cut them. Dawn Lambert identifies her stressors and triggers as being related to the group home environment.  She reports a history of self-injurious behaviors, including cutting her right wrist with a knife two months ago and cutting her left wrist with scissors one week ago. She exhibits a reddened area around her left wrist. [name] endorses depressive symptoms such as irritability and hopelessness, sleep difficulties, and a recent onset of night terrors. She reports her appetite as being pretty good. Dawn Lambert experiences auditory hallucinations of a command type, hearing voices telling her to go home and slit her wrists so she could die. She denies a history of trauma or abuse. She is unable to provide her  mental health history, or any previous inpatient psychiatric treatment. She is also unable to provide information about her outpatient psychiatry services but mentions receiving virtual counseling services with her last appointment being on Tuesday.  Syndi denies access to firearms, illicit substance use, or alcohol use. She states that she has been residing at the group home for 15 years and reports negative experiences with the staff. [name] believes the group home staff will not tell the truth about her situation if were to contact them. UDS positive for tricyclics. BAL is unremarkable. PDMP Review revealed no active prescription.   During the evaluation, she is alert and oriented, sitting up in bed, dressed in hospital scrubs. She  answers questions appropriately, no preoccupation or distractibility observed, does not appear to be responding to internal or external stimuli.    Collateral per D. Willeen Cass, TTS Counselor:   Psych Team spoke to Clinical Director, Dr. Elio Forget [phone]. He reports patient is acting out because she was not able to attend a particular event during the group outing. He says patient presents with this type of behavior from time to time which is generally managed at the group home; however, patient walked away from staff stating she wanted to come to the ED. Patient has been a resident at this facility for over a decade. Dr. Cheree Ditto says he does not have any safety concerns and patient is able to return to the group home. He also mentioned that patient has an upcoming appointment with her psychiatrist on Wednesday in which she will be receiving her shot at that time.   09/10 - Patient seen face to  face by this provider, consulted with emergency department physician C. Isaacs; and chart reviewed on 01/09/23.  On reassessment today, Ms. Terlecki has been allegedly disturbing other patients in the unit by banging her head and fist against the glass window in her room. She  says she is doing this because she does not want to go back to her group home and is purposefully trying to inflict brain damage. She states that she will only agree to return if they are actively trying to find her new placement. She continues to endorse suicidal and homicidal ideations. She denies auditory or visual hallucinations or paranoia. Staff from her group home brought in her Dawn Lambert injection, which was administered without incident.   During the evaluation, she is seen in her room, standing and peering out into the hallway. She is alert and oriented, dressed in hospital scrubs. She  answers questions appropriately, no preoccupation or distractibility observed, does not appear to be responding to internal or external stimuli. During the assessment, she banged her head and fist on the window in her room but was easily redirected.   Disposition:  Continue monitoring for stability overnight with the possibility of discharge on September 11th, 2024, if conditions remain stable. Psychiatry reassessment in the morning.     Past Psychiatric History: Anxiety, Mood disorder (HCC)   Risk to Self:   Risk to Others:   Prior Inpatient Therapy:   Prior Outpatient Therapy:    Past Medical History:  Past Medical History:  Diagnosis Date   Anxiety    Arnold-Chiari malformation (HCC)    Atrial septal defect    Complication of anesthesia    when pt was 11, woke up during surgery (muscle biopsy)   Constipation    Contraception management    Dandruff    Dermatomyositis (HCC)    age 60   Diabetes mellitus without complication (HCC)    Elevated blood pressure    Hypertension    Hypothyroidism    Mental retardation    Mood disorder (HCC)    Overweight(278.02)    Pelvic pain    Prediabetes     Past Surgical History:  Procedure Laterality Date   ASD REPAIR     at age 33   UMBILICAL HERNIA REPAIR N/A 09/17/2017   Procedure: HERNIA REPAIR UMBILICAL ADULT;  Surgeon: Earline Mayotte,  MD;  Location: [facility] ORS;  Service: General;  Laterality: N/A;   Family History: No family history on file. Family Psychiatric  History: None reported  Social History:  Social History   Substance and Sexual Activity  Alcohol Use No     Social History   Substance and Sexual Activity  Drug Use No    Social History   Socioeconomic History   Marital status: Single    Spouse name: Not on file   Number of children: Not on file   Years of education: Not on file   Highest education level: Not on file  Occupational History   Not on file  Tobacco Use   Smoking status: Never   Smokeless tobacco: Never  Vaping Use   Vaping status: Never Used  Substance and Sexual Activity   Alcohol use: No   Drug use: No   Sexual activity: Not on file  Other Topics Concern   Not on file  Social History Narrative   Not on file   Social Determinants of Health   Financial Resource Strain: Not on file  Food Insecurity: No Food Insecurity (10/09/2022)  Hunger Vital Sign    Worried About Running Out of Food in the Last Year: Never true    Ran Out of Food in the Last Year: Never true  Transportation Needs: No Transportation Needs (10/09/2022)   PRAPARE - Administrator, Civil Service (Medical): No    Lack of Transportation (Non-Medical): No  Physical Activity: Not on file  Stress: Not on file  Social Connections: Not on file   Additional Social History:    Allergies:   Allergies  Allergen Reactions   Amoxicillin-Pot Clavulanate Other (See Comments)    Has patient had a PCN reaction causing immediate rash, facial/tongue/throat swelling, SOB or lightheadedness with hypotension: ______  Has patient had a PCN reaction causing severe rash involving mucus membranes or skin necrosis: _______  Has patient had a PCN reaction that required hospitalization: _______  Has patient had a PCN reaction occurring within the last 10 years:_______  If all of the above answers are "NO", then may  proceed with Cephalosporin use.  Other Reaction(s): Unknown  Other reaction(s): Other (See Comments)  Has patient had a PCN reaction causing immediate rash, facial/tongue/throat swelling, SOB or lightheadedness with hypotension: ______  Has patient had a PCN reaction causing severe rash involving mucus membranes or skin necrosis: _______  Has patient had a PCN reaction that required hospitalization: _______  Has patient had a PCN reaction occurring within the last 10 years:_______  If all of the above answers are "NO", then may proceed with Cephalosporin use.    Labs:  Results for orders placed or performed during the hospital encounter of 01/07/23 (from the past 48 hour(s))  Comprehensive metabolic panel     Status: Abnormal   Collection Time: 01/07/23  5:06 PM  Result Value Ref Range   Sodium 139 135 - 145 mmol/L   Potassium 3.7 3.5 - 5.1 mmol/L   Chloride 103 98 - 111 mmol/L   CO2 27 22 - 32 mmol/L   Glucose, Bld 162 (H) 70 - 99 mg/dL    Comment: Glucose reference range applies only to samples taken after fasting for at least 8 hours.   BUN 13 6 - 20 mg/dL   Creatinine, Ser 1.61 0.44 - 1.00 mg/dL   Calcium 9.4 8.9 - 09.6 mg/dL   Total Protein 7.8 6.5 - 8.1 g/dL   Albumin 3.9 3.5 - 5.0 g/dL   AST 18 15 - 41 U/L   ALT 18 0 - 44 U/L   Alkaline Phosphatase 82 38 - 126 U/L   Total Bilirubin 0.3 0.3 - 1.2 mg/dL   GFR, Estimated >04 >54 mL/min    Comment: (NOTE) Calculated using the CKD-EPI Creatinine Equation (2021)    Anion gap 9 5 - 15    Comment: Performed at [facility], [address] [place], [name] [address]  cbc     Status: Abnormal   Collection Time: 01/07/23  5:06 PM  Result Value Ref Range   WBC 8.0 4.0 - 10.5 K/uL   RBC 3.81 (L) 3.87 - 5.11 MIL/uL   Hemoglobin 11.2 (L) 12.0 - 15.0 g/dL   HCT 91.4 (L) 78.2 - 95.6 %   MCV 93.7 80.0 - 100.0 fL   MCH 29.4 26.0 - 34.0 pg   MCHC 31.4 30.0 - 36.0 g/dL   RDW 21.3 08.6 - 57.8 %   Platelets 283 150 - 400  K/uL   nRBC 0.0 0.0 - 0.2 %    Comment: Performed at [facility], [address] [place], [name] [address]  Urine Drug Screen, Qualitative     Status: Abnormal   Collection Time: 01/07/23  5:06 PM  Result Value Ref Range   Tricyclic, Ur Screen POSITIVE (A) NONE DETECTED   Amphetamines, Ur Screen NONE DETECTED NONE DETECTED   MDMA (Ecstasy)Ur Screen NONE DETECTED NONE DETECTED   Cocaine Metabolite,Ur [place] NONE DETECTED NONE DETECTED   Opiate, Ur Screen NONE DETECTED NONE DETECTED   Phencyclidine (PCP) Ur S NONE DETECTED NONE DETECTED   Cannabinoid 50 Ng, Ur [place] NONE DETECTED NONE DETECTED   Barbiturates, Ur Screen NONE DETECTED NONE DETECTED   Benzodiazepine, Ur Scrn NONE DETECTED NONE DETECTED   Methadone Scn, Ur NONE DETECTED NONE DETECTED    Comment: (NOTE) Tricyclics + metabolites, urine    Cutoff 1000 ng/mL Amphetamines + metabolites, urine  Cutoff 1000 ng/mL MDMA (Ecstasy), urine              Cutoff 500 ng/mL Cocaine Metabolite, urine          Cutoff 300 ng/mL Opiate + metabolites, urine        Cutoff 300 ng/mL Phencyclidine (PCP), urine         Cutoff 25 ng/mL Cannabinoid, urine                 Cutoff 50 ng/mL Barbiturates + metabolites, urine  Cutoff 200 ng/mL Benzodiazepine, urine              Cutoff 200 ng/mL Methadone, urine                   Cutoff 300 ng/mL  The urine drug screen provides only a preliminary, unconfirmed analytical test result and should not be used for non-medical purposes. Clinical consideration and professional judgment should be applied to any positive drug screen result due to possible interfering substances. A more specific alternate chemical method must be used in order to obtain a confirmed analytical result. Gas chromatography / mass spectrometry (GC/MS) is the preferred confirm atory method. Performed at [facility], [address] [place], [name] [address]   Acetaminophen level     Status: Abnormal   Collection  Time: 01/07/23  6:15 PM  Result Value Ref Range   Acetaminophen (Tylenol), Serum <10 (L) 10 - 30 ug/mL    Comment: (NOTE) Therapeutic concentrations vary significantly. A range of 10-30 ug/mL  may be an effective concentration for many patients. However, some  are best treated at concentrations outside of this range. Acetaminophen concentrations >150 ug/mL at 4 hours after ingestion  and >50 ug/mL at 12 hours after ingestion are often associated with  toxic reactions.  Performed at [facility], [address] [place], [name] [address]   Ethanol     Status: None   Collection Time: 01/07/23  6:15 PM  Result Value Ref Range   Alcohol, Ethyl (B) <10 <10 mg/dL    Comment: (NOTE) Lowest detectable limit for serum alcohol is 10 mg/dL.  For medical purposes only. Performed at [facility], [address] [place], [name] [address]   Salicylate level     Status: Abnormal   Collection Time: 01/07/23  6:15 PM  Result Value Ref Range   Salicylate Lvl <7.0 (L) 7.0 - 30.0 mg/dL    Comment: Performed at [facility], [address] [place], [name] [address]  CBG monitoring, ED     Status: Abnormal   Collection Time: 01/08/23 11:22 AM  Result Value Ref Range  Glucose-Capillary 151 (H) 70 - 99 mg/dL    Comment: Glucose reference range applies only to samples taken after fasting for at least 8 hours.  CBG monitoring, ED     Status: Abnormal   Collection Time: 01/08/23  4:13 PM  Result Value Ref Range   Glucose-Capillary 206 (H) 70 - 99 mg/dL    Comment: Glucose reference range applies only to samples taken after fasting for at least 8 hours.  CBG monitoring, ED     Status: Abnormal   Collection Time: 01/08/23  9:46 PM  Result Value Ref Range   Glucose-Capillary 189 (H) 70 - 99 mg/dL    Comment: Glucose reference range applies only to samples taken after fasting for at least 8 hours.  CBG monitoring, ED     Status: Abnormal   Collection Time: 01/09/23   8:15 AM  Result Value Ref Range   Glucose-Capillary 177 (H) 70 - 99 mg/dL    Comment: Glucose reference range applies only to samples taken after fasting for at least 8 hours.    Current Facility-Administered Medications  Medication Dose Route Frequency Provider Last Rate Last Admin   acetaminophen (TYLENOL) tablet 650 mg  650 mg Oral Q6H PRN Ward, Kristen N, DO       albuterol (VENTOLIN HFA) 108 (90 Base) MCG/ACT inhaler 2 puff  2 puff Inhalation Q4H PRN Ward, Kristen N, DO       benzonatate (TESSALON) capsule 100 mg  100 mg Oral TID PRN Ward, Kristen N, DO       carbamazepine (TEGRETOL) tablet 400 mg  400 mg Oral BID WC Ward, Kristen N, DO   400 mg at 01/09/23 0831   clonazePAM (KLONOPIN) tablet 1 mg  1 mg Oral BID Ward, Kristen N, DO   1 mg at 01/09/23 0832   fluticasone (FLONASE) 50 MCG/ACT nasal spray 1 spray  1 spray Each Nare Daily PRN Ward, Kristen N, DO       gabapentin (NEURONTIN) capsule 800 mg  800 mg Oral TID Ward, Kristen N, DO   800 mg at 01/09/23 6962   guaiFENesin (MUCINEX) 12 hr tablet 1,200 mg  1,200 mg Oral Daily PRN Ward, Kristen N, DO       hydrOXYzine (ATARAX) tablet 25 mg  25 mg Oral TID PRN Casten Floren H, NP       insulin aspart (novoLOG) injection 0-15 Units  0-15 Units Subcutaneous TID WC Sharyn Creamer, MD   3 Units at 01/09/23 9528   insulin aspart (novoLOG) injection 0-5 Units  0-5 Units Subcutaneous QHS Sharyn Creamer, MD       levothyroxine (SYNTHROID) tablet 175 mcg  175 mcg Oral Q0600 Ward, Kristen N, DO   175 mcg at 01/09/23 4132   lisinopril (ZESTRIL) tablet 10 mg  10 mg Oral QHS Ward, Kristen N, DO   10 mg at 01/08/23 2150   lithium carbonate capsule 300 mg  300 mg Oral Q breakfast Otelia Sergeant, RPH   300 mg at 01/09/23 4401   And   lithium carbonate capsule 600 mg  600 mg Oral Q supper Otelia Sergeant, RPH   600 mg at 01/08/23 1625   loratadine (CLARITIN) tablet 10 mg  10 mg Oral Daily PRN Ward, Kristen N, DO       LORazepam (ATIVAN) tablet 1 mg  1 mg  Oral BID Otelia Sergeant, RPH   1 mg at 01/09/23 0831   magnesium hydroxide (MILK OF MAGNESIA) suspension 30 mL  30 mL Oral QHS PRN Ward, Layla Maw, DO       meloxicam (MOBIC) tablet 15 mg  15 mg Oral Daily Ward, Kristen N, DO   15 mg at 01/09/23 0831   nystatin (MYCOSTATIN/NYSTOP) topical powder 1 Application  1 Application Topical BID PRN Ward, Kristen N, DO       ondansetron (ZOFRAN-ODT) disintegrating tablet 8 mg  8 mg Oral Q8H PRN Ward, Kristen N, DO       propranolol (INDERAL) tablet 40 mg  40 mg Oral BID Ward, Kristen N, DO   40 mg at 01/09/23 0831   QUEtiapine (SEROQUEL XR) 24 hr tablet 400 mg  400 mg Oral QHS Ward, Kristen N, DO   400 mg at 01/08/23 2150   QUEtiapine (SEROQUEL) tablet 50 mg  50 mg Oral Daily Ward, Kristen N, DO   50 mg at 01/09/23 0272   Current Outpatient Medications  Medication Sig Dispense Refill   acetaminophen (TYLENOL) 325 MG tablet Take 650 mg by mouth every 6 (six) hours as needed for mild pain or moderate pain.     albuterol (VENTOLIN HFA) 108 (90 Base) MCG/ACT inhaler Inhale 2 puffs into the lungs every 4 (four) hours as needed.     benzonatate (TESSALON) 100 MG capsule Take 100 mg by mouth 3 (three) times daily as needed for cough.     carbamazepine (TEGRETOL) 200 MG tablet Take 400 mg by mouth 2 (two) times daily. With food     cetirizine (ZYRTEC) 10 MG tablet Take 10 mg by mouth daily as needed for allergies.     cholecalciferol (VITAMIN D) 1000 units tablet Take 1,000 Units by mouth daily.      clonazePAM (KLONOPIN) 1 MG tablet Take 1 mg by mouth 2 (two) times daily.     Fluocinolone Acetonide Scalp 0.01 % OIL Apply to aa's scalp QD on Monday, Wednesday and Friday x 6 weeks. After 6 wks decrease to QW on Friday. May increase to QD on Monday, Wednesday, and Friday during times of flares. 118.28 mL 11   fluticasone (FLONASE) 50 MCG/ACT nasal spray Place 1 spray into both nostrils daily as needed for allergies or rhinitis.     gabapentin (NEURONTIN) 800 MG  tablet Take 800 mg by mouth 3 (three) times daily.     Guaifenesin 1200 MG TB12 Take 1,200 mg by mouth daily as needed (Congestion).     ibuprofen (ADVIL) 600 MG tablet Take 600 mg by mouth every 6 (six) hours as needed.     INVEGA TRINZA 819 MG/2.63ML injection Inject 819 mg into the muscle every 3 (three) months.     levothyroxine (SYNTHROID, LEVOTHROID) 175 MCG tablet Take 175 mcg by mouth daily before breakfast.     lidocaine (XYLOCAINE) 2 % solution Use as directed 15 mLs in the mouth or throat as needed for mouth pain.     lisinopril (ZESTRIL) 10 MG tablet Take 10 mg by mouth at bedtime.     lithium carbonate 300 MG capsule Take 300-600 mg by mouth 2 (two) times daily. Take 300 mg by mouth every morning 600 mg At bedtime     LORazepam ER (LOREEV XR) 2 MG CS24 Take 2 mg by mouth in the morning.     magnesium hydroxide (MILK OF MAGNESIA) 400 MG/5ML suspension Take 30 mLs by mouth at bedtime as needed for mild constipation.     meloxicam (MOBIC) 15 MG tablet Take 1 tablet by mouth daily.     Multiple Vitamin (  THEREMS PO) Take 1 tablet by mouth daily. Take with food     nystatin powder Apply 1 Application topically 2 (two) times daily as needed (fungal infection). Apply under breast     ondansetron (ZOFRAN-ODT) 8 MG disintegrating tablet Take 8 mg by mouth every 8 (eight) hours as needed.     propranolol (INDERAL) 40 MG tablet Take 40 mg by mouth 2 (two) times daily.     QUEtiapine (SEROQUEL XR) 400 MG 24 hr tablet Take 400 mg by mouth at bedtime.     QUEtiapine (SEROQUEL) 50 MG tablet Take 50 mg by mouth daily. At 2pm.     TRULICITY 3 MG/0.5ML SOPN Inject 3 mg into the skin once a week. Wednesday      Musculoskeletal: Strength & Muscle Tone: within normal limits Gait & Station:  Did not assess  Patient leans: N/A            Psychiatric Specialty Exam:  Presentation  General Appearance:  Appropriate for Environment  Eye Contact: Good  Speech: Clear and  Coherent  Speech Volume: Normal  Handedness: Right   Mood and Affect  Mood: Euthymic  Affect: Congruent   Thought Process  Thought Processes: Coherent  Descriptions of Associations:Intact  Orientation:Full (Time, Place and Person)  Thought Content:Perseveration  History of Schizophrenia/Schizoaffective disorder:No data recorded Duration of Psychotic Symptoms:No data recorded Hallucinations:Hallucinations: None  Ideas of Reference:None  Suicidal Thoughts:Suicidal Thoughts: Yes, Active SI Active Intent and/or Plan: With Intent; With Plan  Homicidal Thoughts:Homicidal Thoughts: No   Sensorium  Memory: Immediate Good; Recent Fair  Judgment: Impaired  Insight: Shallow   Executive Functions  Concentration: Fair  Attention Span: Fair  Recall: Good  Fund of Knowledge: Fair  Language: Good   Psychomotor Activity  Psychomotor Activity:Psychomotor Activity: Normal   Assets  Assets: Desire for Improvement; Housing; Physical Health; Resilience   Sleep  Sleep:Sleep: Fair   Physical Exam: Physical Exam Vitals and nursing note reviewed.  Constitutional:      General: She is not in acute distress. HENT:     Head: Normocephalic.     Nose: Nose normal.  Cardiovascular:     Pulses: Normal pulses.  Pulmonary:     Effort: Pulmonary effort is normal. No respiratory distress.  Musculoskeletal:        General: Normal range of motion.     Cervical back: Normal range of motion.  Neurological:     Mental Status: She is alert and oriented to person, place, and time.    Review of Systems  Respiratory:  Negative for shortness of breath.   Psychiatric/Behavioral:  Positive for suicidal ideas.   All other systems reviewed and are negative.  Blood pressure 124/68, pulse 77, temperature 98.5 F (36.9 C), temperature source Oral, resp. rate 16, height 5\' 2"  (1.575 m), weight 123.4 kg, SpO2 98%. Body mass index is 49.75 kg/m.  Treatment Plan  Summary: Daily contact with patient to assess and evaluate symptoms and progress in treatment, Medication management, and Plan :   Patient will continue observation in the emergency department overnight to allow her time to de-escalate feelings toward group home staff members, psychiatry to reassess in the morning.    Medication Management: -Home medications were restarted by the ED physician  -Continue Hydroxyzine 50 mg three times daily as needed to manage anxiety symptoms   Lab:  -Lithium Level awaiting collection   Disposition: Continue monitoring for stability overnight with the possibility of discharge on September 11th, 2024, if conditions remain stable.  Psychiatry reassessment in the morning.  Supportive therapy provided about ongoing stressors.  Norma Fredrickson, NP 01/09/2023 9:48 AM

## 2023-01-09 NOTE — ED Notes (Signed)
IV[facility]/  PENDING  PLACEMENT 

## 2023-01-09 NOTE — ED Notes (Addendum)
During nursing assessment Ms Stansberry was A/Ox 4 .  She  stated that she is not currently have thoughts or feelings of SI/HI.  Ms Levengood also reported that she is not currently having auditory or visual hallucinations.  Pt affect is congruent with situation , eye contact is good , speech is of normal rate and volume  with appropriate verbiage noted.  Staff addressed any feelings or concerns that have been brought up.  Medications were administered as ordered, Group home provided Invega trinza 819 mg / 2.63 that was administered w/o incident. Continue to monitor patient as ordered for any changes in behaviors and for continued safety.

## 2023-01-09 NOTE — ED Notes (Signed)
Pt is A/Ox 4, Ms Oramas declines any SI/HI stated that she does not have A/V hallucinations.  Discharge instructions reviewed with Group home Rep, Ms [name], they verbalized understanding.  All Belongings accounted for and returned to PT.  Pt left private transport

## 2023-01-09 NOTE — ED Provider Notes (Signed)
Emergency Medicine Observation Re-evaluation Note  Dawn Lambert is a 32 y.o. female, seen on rounds today.  Pt initially presented to the ED for complaints of Suicidal Currently, the patient is calm in NAD.  Physical Exam  BP 124/68 (BP Location: Left Arm)   Pulse 77   Temp 98.5 F (36.9 C) (Oral)   Resp 16   Ht 5\' 2"  (1.575 m)   Wt 123.4 kg   SpO2 98%   BMI 49.75 kg/m    ED Course / MDM  EKG:   I have reviewed the labs performed to date as well as medications administered while in observation.  Recent changes in the last 24 hours include none.  Plan  Current plan is for [facility] dispo    Shaune Pollack, MD 01/09/23 [phone]

## 2023-01-09 NOTE — Inpatient Diabetes Management (Signed)
Inpatient Diabetes Program Recommendations  AACE/ADA: New Consensus Statement on Inpatient Glycemic Control   Target Ranges:  Prepandial:   less than 140 mg/dL      Peak postprandial:   less than 180 mg/dL (1-2 hours)      Critically ill patients:  140 - 180 mg/dL    Latest Reference Range & Units 01/08/23 11:22 01/08/23 16:13 01/08/23 21:46  Glucose-Capillary 70 - 99 mg/dL 409 (H) 811 (H) 914 (H)   Review of Glycemic Control   Diabetes history: DM2 Outpatient Diabetes medications: Trulicity 3mg  Qweek Current orders for Inpatient glycemic control: Novolog 0-15 units TID with meals, Novolog 0-5 units QHS  Inpatient Diabetes Program Recommendations:    Diet: Please consider discontinuing Regular diet and ordering Carb Modified diet.  Thanks, Orlando Penner, RN, MSN, CDCES Diabetes Coordinator Inpatient Diabetes Program [phone] (Team Pager from 8am to 5pm)

## 2023-01-10 DIAGNOSIS — F411 Generalized anxiety disorder: Secondary | ICD-10-CM | POA: Diagnosis not present

## 2023-01-10 DIAGNOSIS — R45851 Suicidal ideations: Secondary | ICD-10-CM | POA: Diagnosis not present

## 2023-01-10 NOTE — ED Notes (Signed)
Report from annie, rn.

## 2023-01-10 NOTE — ED Notes (Signed)
Pt left with group home representive
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
## 2023-01-10 NOTE — ED Provider Notes (Addendum)
Emergency Medicine Observation Re-evaluation Note  Dawn Lambert is a 32 y.o. female, seen on rounds today.  Pt initially presented to the ED for complaints of Suicidal Currently, the patient is in NAD.  Physical Exam  BP (!) 150/89 (BP Location: Right Arm)   Pulse 85   Temp 98.3 F (36.8 C) (Oral)   Resp 17   Ht 5\' 2"  (1.575 m)   Wt 123.4 kg   SpO2 98%   BMI 49.75 kg/m    ED Course / MDM  EKG:   I have reviewed the labs performed to date as well as medications administered while in observation.  Recent changes in the last 24 hours include none.  Plan  Current plan is for Psych dispo.  ADDENDUM: Pt was supposed to be re-evaluated by Psych today for discharge. Pt was scheduled to go to group home today. Unfortunately, there is apparently no psych coverage at this time. Will wait to rescind as per most recent completed noted, plan was to re-assess and she had not been cleared.   Shaune Pollack, MD 01/10/23 Theodis Blaze    Shaune Pollack, MD 01/10/23 [phone]

## 2023-01-10 NOTE — ED Notes (Signed)
[facility] PAPERS  RESCINDED  PER  DR  Ellender Hose  MD

## 2023-01-10 NOTE — ED Notes (Signed)
Report to day shift rn

## 2023-02-06 ENCOUNTER — Other Ambulatory Visit: Payer: Self-pay | Admitting: Family Medicine

## 2023-02-06 DIAGNOSIS — R911 Solitary pulmonary nodule: Secondary | ICD-10-CM

## 2023-02-14 ENCOUNTER — Ambulatory Visit
Admission: RE | Admit: 2023-02-14 | Discharge: 2023-02-14 | Disposition: A | Discharge status: Home / Self Care | Payer: 59 | Source: Ambulatory Visit | Attending: Family Medicine | Admitting: Family Medicine

## 2023-02-14 DIAGNOSIS — R911 Solitary pulmonary nodule: Secondary | ICD-10-CM | POA: Diagnosis present

## 2023-02-23 NOTE — Progress Notes (Unsigned)
Sleep Medicine   Office Visit  Patient Name: Dawn Lambert DOB: 1990-05-23 MRN [id number]    Chief Complaint: go over sleep study results   Brief History:  Dawn Lambert presents for a follow up visit to discuss the results of her sleep study. Patient has a about 10 year history of occasional loud snoring. Sleep quality is good. This is noted most nights. The patient's bed partner reports occasional loud snoring, talking and witnessed apnea at night. The patient relates the following symptoms: loud snoring, gasping, excessive daytime sleepiness and witnessed apnea are also present. The patient goes to sleep at 8 pm and wakes up at 6 am. Patient has noted no restlessness of her legs at night that would disrupt her sleep.  The patient  relates no unusual behavior during the night.  The patient relates  a history of psychiatric problems. The Epworth Sleepiness Score is 12 out of 24 .  The patient relates  Cardiovascular risk factors include: hypertension.     ROS  General: (-) fever, (-) chills, (-) night sweat Nose and Sinuses: (-) nasal stuffiness or itchiness, (-) postnasal drip, (-) nosebleeds, (-) sinus trouble. Mouth and Throat: (-) sore throat, (-) hoarseness. Neck: (-) swollen glands, (-) enlarged thyroid, (-) neck pain. Respiratory: - cough, - shortness of breath, - wheezing. Neurologic: - numbness, - tingling. Psychiatric: + anxiety, + depression Sleep behavior: -sleep paralysis -hypnogogic hallucinations -dream enactment      -vivid dreams -cataplexy -night terrors -sleep walking   Current Medication: Outpatient Encounter Medications as of 02/26/2023  Medication Sig Note   acetaminophen (TYLENOL) 325 MG tablet Take 650 mg by mouth every 6 (six) hours as needed for mild pain or moderate pain.    albuterol (VENTOLIN HFA) 108 (90 Base) MCG/ACT inhaler Inhale 2 puffs into the lungs every 4 (four) hours as needed.    benzonatate (TESSALON) 100 MG capsule Take 100 mg by mouth 3 (three)  times daily as needed for cough.    carbamazepine (TEGRETOL) 200 MG tablet Take 400 mg by mouth 2 (two) times daily. With food    cetirizine (ZYRTEC) 10 MG tablet Take 10 mg by mouth daily as needed for allergies.    cholecalciferol (VITAMIN D) 1000 units tablet Take 1,000 Units by mouth daily.     clonazePAM (KLONOPIN) 1 MG tablet Take 1 mg by mouth 2 (two) times daily. 10/09/2022: Also can take 1 tablet daily as needed   Fluocinolone Acetonide Scalp 0.01 % OIL Apply to aa's scalp QD on Monday, Wednesday and Friday x 6 weeks. After 6 wks decrease to QW on Friday. May increase to QD on Monday, Wednesday, and Friday during times of flares.    fluticasone (FLONASE) 50 MCG/ACT nasal spray Place 1 spray into both nostrils daily as needed for allergies or rhinitis.    gabapentin (NEURONTIN) 800 MG tablet Take 800 mg by mouth 3 (three) times daily.    Guaifenesin 1200 MG TB12 Take 1,200 mg by mouth daily as needed (Congestion).    ibuprofen (ADVIL) 600 MG tablet Take 600 mg by mouth every 6 (six) hours as needed.    INVEGA TRINZA 819 MG/2.63ML injection Inject 819 mg into the muscle every 3 (three) months.    levothyroxine (SYNTHROID, LEVOTHROID) 175 MCG tablet Take 175 mcg by mouth daily before breakfast.    lidocaine (XYLOCAINE) 2 % solution Use as directed 15 mLs in the mouth or throat as needed for mouth pain.    lisinopril (ZESTRIL) 10 MG tablet Take  10 mg by mouth at bedtime.    lithium carbonate 300 MG capsule Take 300-600 mg by mouth 2 (two) times daily. Take 300 mg by mouth every morning 600 mg At bedtime    LORazepam ER (LOREEV XR) 2 MG CS24 Take 2 mg by mouth in the morning.    magnesium hydroxide (MILK OF MAGNESIA) 400 MG/5ML suspension Take 30 mLs by mouth at bedtime as needed for mild constipation.    meloxicam (MOBIC) 15 MG tablet Take 1 tablet by mouth daily.    Multiple Vitamin (THEREMS PO) Take 1 tablet by mouth daily. Take with food    nystatin powder Apply 1 Application topically 2  (two) times daily as needed (fungal infection). Apply under breast    ondansetron (ZOFRAN-ODT) 8 MG disintegrating tablet Take 8 mg by mouth every 8 (eight) hours as needed.    propranolol (INDERAL) 40 MG tablet Take 40 mg by mouth 2 (two) times daily.    QUEtiapine (SEROQUEL XR) 400 MG 24 hr tablet Take 400 mg by mouth at bedtime.    QUEtiapine (SEROQUEL) 50 MG tablet Take 50 mg by mouth daily. At 2pm.    TRULICITY 3 MG/0.5ML SOPN Inject 3 mg into the skin once a week. Wednesday    [DISCONTINUED] ziprasidone (GEODON) 20 MG capsule Take 1 capsule (20 mg total) by mouth once as needed (severe agitation).    No facility-administered encounter medications on file as of 02/26/2023.    Surgical History: Past Surgical History:  Procedure Laterality Date   ASD REPAIR     at age 30   UMBILICAL HERNIA REPAIR N/A 09/17/2017   Procedure: HERNIA REPAIR UMBILICAL ADULT;  Surgeon: Earline Mayotte, MD;  Location: [facility] ORS;  Service: General;  Laterality: N/A;    Medical History: Past Medical History:  Diagnosis Date   Anxiety    Arnold-Chiari malformation (HCC)    Atrial septal defect    Complication of anesthesia    when pt was 11, woke up during surgery (muscle biopsy)   Constipation    Contraception management    Dandruff    Dermatomyositis (HCC)    age 67   Diabetes mellitus without complication (HCC)    Elevated blood pressure    Hypertension    Hypothyroidism    Mental retardation    Mood disorder (HCC)    Overweight(278.02)    Pelvic pain    Prediabetes     Family History: Non contributory to the present illness  Social History: Social History   Socioeconomic History   Marital status: Single    Spouse name: Not on file   Number of children: Not on file   Years of education: Not on file   Highest education level: Not on file  Occupational History   Not on file  Tobacco Use   Smoking status: Never   Smokeless tobacco: Never  Vaping Use   Vaping status: Never Used   Substance and Sexual Activity   Alcohol use: No   Drug use: No   Sexual activity: Not on file  Other Topics Concern   Not on file  Social History Narrative   Not on file   Social Determinants of Health   Financial Resource Strain: Not on file  Food Insecurity: No Food Insecurity (10/09/2022)   Hunger Vital Sign    Worried About Running Out of Food in the Last Year: Never true    Ran Out of Food in the Last Year: Never true  Transportation Needs: No  Transportation Needs (10/09/2022)   PRAPARE - Administrator, Civil Service (Medical): No    Lack of Transportation (Non-Medical): No  Physical Activity: Not on file  Stress: Not on file  Social Connections: Not on file  Intimate Partner Violence: Not At Risk (10/09/2022)   Humiliation, Afraid, Rape, and Kick questionnaire    Fear of Current or Ex-Partner: No    Emotionally Abused: No    Physically Abused: No    Sexually Abused: No    Vital Signs: There were no vitals taken for this visit. There is no height or weight on file to calculate BMI.   Examination: General Appearance: The patient is well-developed, well-nourished, and in no distress. Neck Circumference: 45 cm Skin: Gross inspection of skin unremarkable. Head: normocephalic, no gross deformities. Eyes: no gross deformities noted. ENT: ears appear grossly normal Neurologic: Alert and oriented. No involuntary movements.    STOP BANG RISK ASSESSMENT S (snore) Have you been told that you snore?     YES   T (tired) Are you often tired, fatigued, or sleepy during the day?   YES  O (obstruction) Do you stop breathing, choke, or gasp during sleep? YES   P (pressure) Do you have or are you being treated for high blood pressure? YES   B (BMI) Is your body index greater than 35 kg/m? YES   A (age) Are you 75 years old or older? NO   N (neck) Do you have a neck circumference greater than 16 inches?   YES   G (gender) Are you a female? NO   TOTAL  STOP/BANG "YES" ANSWERS 6                                                               A STOP-Bang score of 2 or less is considered low risk, and a score of 5 or more is high risk for having either moderate or severe OSA. For people who score 3 or 4, doctors may need to perform further assessment to determine how likely they are to have OSA.         EPWORTH SLEEPINESS SCALE:  Scale:  (0)= no chance of dozing; (1)= slight chance of dozing; (2)= moderate chance of dozing; (3)= high chance of dozing  Chance  Situtation    Sitting and reading: 0    Watching TV: 2    Sitting Inactive in public: 1    As a passenger in car: 3      Lying down to rest: 3    Sitting and talking: 1    Sitting quielty after lunch: 2    In a car, stopped in traffic: 0   TOTAL SCORE:   12 out of 24    SLEEP STUDIES:  PSG (12/2022) AHI 1.2/hr, min SpO2 92%, did not demonstrate significant apnea.      LABS: Recent Results (from the past 2160 hour(s))  Comprehensive metabolic panel     Status: Abnormal   Collection Time: 01/07/23  5:06 PM  Result Value Ref Range   Sodium 139 135 - 145 mmol/L   Potassium 3.7 3.5 - 5.1 mmol/L   Chloride 103 98 - 111 mmol/L   CO2 27 22 - 32 mmol/L   Glucose, Bld 162 (H) 70 -  99 mg/dL    Comment: Glucose reference range applies only to samples taken after fasting for at least 8 hours.   BUN 13 6 - 20 mg/dL   Creatinine, Ser 1.61 0.44 - 1.00 mg/dL   Calcium 9.4 8.9 - 09.6 mg/dL   Total Protein 7.8 6.5 - 8.1 g/dL   Albumin 3.9 3.5 - 5.0 g/dL   AST 18 15 - 41 U/L   ALT 18 0 - 44 U/L   Alkaline Phosphatase 82 38 - 126 U/L   Total Bilirubin 0.3 0.3 - 1.2 mg/dL   GFR, Estimated >04 >54 mL/min    Comment: (NOTE) Calculated using the CKD-EPI Creatinine Equation (2021)    Anion gap 9 5 - 15    Comment: Performed at [facility], [address] [place], [name] [address]  cbc     Status: Abnormal   Collection Time: 01/07/23  5:06 PM  Result Value  Ref Range   WBC 8.0 4.0 - 10.5 K/uL   RBC 3.81 (L) 3.87 - 5.11 MIL/uL   Hemoglobin 11.2 (L) 12.0 - 15.0 g/dL   HCT 91.4 (L) 78.2 - 95.6 %   MCV 93.7 80.0 - 100.0 fL   MCH 29.4 26.0 - 34.0 pg   MCHC 31.4 30.0 - 36.0 g/dL   RDW 21.3 08.6 - 57.8 %   Platelets 283 150 - 400 K/uL   nRBC 0.0 0.0 - 0.2 %    Comment: Performed at [facility], [address], [place], [name] [address]  Urine Drug Screen, Qualitative     Status: Abnormal   Collection Time: 01/07/23  5:06 PM  Result Value Ref Range   Tricyclic, Ur Screen POSITIVE (A) NONE DETECTED   Amphetamines, Ur Screen NONE DETECTED NONE DETECTED   MDMA (Ecstasy)Ur Screen NONE DETECTED NONE DETECTED   Cocaine Metabolite,Ur [place] NONE DETECTED NONE DETECTED   Opiate, Ur Screen NONE DETECTED NONE DETECTED   Phencyclidine (PCP) Ur S NONE DETECTED NONE DETECTED   Cannabinoid 50 Ng, Ur [place] NONE DETECTED NONE DETECTED   Barbiturates, Ur Screen NONE DETECTED NONE DETECTED   Benzodiazepine, Ur Scrn NONE DETECTED NONE DETECTED   Methadone Scn, Ur NONE DETECTED NONE DETECTED    Comment: (NOTE) Tricyclics + metabolites, urine    Cutoff 1000 ng/mL Amphetamines + metabolites, urine  Cutoff 1000 ng/mL MDMA (Ecstasy), urine              Cutoff 500 ng/mL Cocaine Metabolite, urine          Cutoff 300 ng/mL Opiate + metabolites, urine        Cutoff 300 ng/mL Phencyclidine (PCP), urine         Cutoff 25 ng/mL Cannabinoid, urine                 Cutoff 50 ng/mL Barbiturates + metabolites, urine  Cutoff 200 ng/mL Benzodiazepine, urine              Cutoff 200 ng/mL Methadone, urine                   Cutoff 300 ng/mL  The urine drug screen provides only a preliminary, unconfirmed analytical test result and should not be used for non-medical purposes. Clinical consideration and professional judgment should be applied to any positive drug screen result due to possible interfering substances. A more specific alternate chemical method must be used in  order to obtain a confirmed analytical result. Gas chromatography / mass spectrometry (GC/MS) is the  preferred confirm atory method. Performed at [facility], [address] [place], [name] [address]   Acetaminophen level     Status: Abnormal   Collection Time: 01/07/23  6:15 PM  Result Value Ref Range   Acetaminophen (Tylenol), Serum <10 (L) 10 - 30 ug/mL    Comment: (NOTE) Therapeutic concentrations vary significantly. A range of 10-30 ug/mL  may be an effective concentration for many patients. However, some  are best treated at concentrations outside of this range. Acetaminophen concentrations >150 ug/mL at 4 hours after ingestion  and >50 ug/mL at 12 hours after ingestion are often associated with  toxic reactions.  Performed at [facility], [address] [place], [name] [address]   Ethanol     Status: None   Collection Time: 01/07/23  6:15 PM  Result Value Ref Range   Alcohol, Ethyl (B) <10 <10 mg/dL    Comment: (NOTE) Lowest detectable limit for serum alcohol is 10 mg/dL.  For medical purposes only. Performed at [facility], [address] [place], [name] [address]   Salicylate level     Status: Abnormal   Collection Time: 01/07/23  6:15 PM  Result Value Ref Range   Salicylate Lvl <7.0 (L) 7.0 - 30.0 mg/dL    Comment: Performed at [facility], [address] [place], [name] [address]  CBG monitoring, ED     Status: Abnormal   Collection Time: 01/08/23 11:22 AM  Result Value Ref Range   Glucose-Capillary 151 (H) 70 - 99 mg/dL    Comment: Glucose reference range applies only to samples taken after fasting for at least 8 hours.  CBG monitoring, ED     Status: Abnormal   Collection Time: 01/08/23  4:13 PM  Result Value Ref Range   Glucose-Capillary 206 (H) 70 - 99 mg/dL    Comment: Glucose reference range applies only to samples taken after fasting for at least 8 hours.  CBG monitoring, ED     Status: Abnormal    Collection Time: 01/08/23  9:46 PM  Result Value Ref Range   Glucose-Capillary 189 (H) 70 - 99 mg/dL    Comment: Glucose reference range applies only to samples taken after fasting for at least 8 hours.  CBG monitoring, ED     Status: Abnormal   Collection Time: 01/09/23  8:15 AM  Result Value Ref Range   Glucose-Capillary 177 (H) 70 - 99 mg/dL    Comment: Glucose reference range applies only to samples taken after fasting for at least 8 hours.  CBG monitoring, ED     Status: Abnormal   Collection Time: 01/09/23 11:34 AM  Result Value Ref Range   Glucose-Capillary 198 (H) 70 - 99 mg/dL    Comment: Glucose reference range applies only to samples taken after fasting for at least 8 hours.  CBG monitoring, ED     Status: Abnormal   Collection Time: 01/09/23  4:48 PM  Result Value Ref Range   Glucose-Capillary 151 (H) 70 - 99 mg/dL    Comment: Glucose reference range applies only to samples taken after fasting for at least 8 hours.  CBG monitoring, ED     Status: Abnormal   Collection Time: 01/09/23  8:43 PM  Result Value Ref Range   Glucose-Capillary 170 (H) 70 - 99 mg/dL    Comment: Glucose reference range applies only to samples taken after fasting for at least 8 hours.    Radiology: No results found.  No results found.  No results found.    Assessment and Plan: Patient Active Problem List   Diagnosis Date Noted   Generalized anxiety disorder 01/08/2023   Episodic mood disorder (HCC) 01/08/2023   Snoring 12/04/2022   Witnessed episode of apnea 12/04/2022   Sepsis without acute organ dysfunction (HCC) 10/10/2022   Acute gastroenteritis 10/09/2022   Morbid obesity with BMI of 50.0-59.9, adult (HCC) 10/09/2022   Urinary tract infection 10/09/2022   AKI (acute kidney injury) (HCC) 10/09/2022   Hypertension 10/09/2022   Uncontrolled type 2 diabetes mellitus with hyperglycemia, without long-term current use of insulin (HCC) 10/09/2022   Hypothyroidism 10/09/2022   Joint pain  10/09/2022   Behavior concern    Depression    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 Snoring PSG was negative for OSA.  Reviewed at length. May benefit from ENT evaluation. Recommend weight loss  2. Morbid obesity with BMI of 50.0-59.9, adult (HCC) Obesity Counseling: Had a lengthy discussion regarding patients BMI and weight issues. Patient was instructed on portion control as well as increased activity. Also discussed caloric restrictions with trying to maintain intake less than 2000 Kcal. Discussions were made in accordance with the 5As of weight management. Simple actions such as not eating late and if able to, taking a walk is suggested.     General Counseling: I have discussed the findings of the evaluation and examination with Cheyanne.  I have also discussed any further diagnostic evaluation thatmay be needed or ordered today. Maylee verbalizes understanding of the findings of todays visit. We also reviewed her medications today and discussed drug interactions and side effects including but not limited excessive drowsiness and altered mental states. We also discussed that there is always a risk not just to her but also people around her. she has been encouraged to call the office with any questions or concerns that should arise related to todays visit.  No orders of the defined types were placed in this encounter.       I have personally obtained a history, evaluated the patient, evaluated pertinent data, formulated the assessment and plan and placed orders.   This patient was seen today by Emmaline Kluver, PA-C in collaboration with Dr. Freda Munro.   Yevonne Pax, MD [facility] Diplomate ABMS Pulmonary and Critical Care Medicine Sleep medicine

## 2023-02-26 ENCOUNTER — Ambulatory Visit (INDEPENDENT_AMBULATORY_CARE_PROVIDER_SITE_OTHER): Payer: 59 | Admitting: Internal Medicine

## 2023-02-26 VITALS — BP 127/78 | HR 68 | Resp 16 | Ht 62.0 in | Wt 289.0 lb

## 2023-02-26 DIAGNOSIS — R0683 Snoring: Secondary | ICD-10-CM

## 2023-02-26 DIAGNOSIS — Z6841 Body Mass Index (BMI) 40.0 and over, adult: Secondary | ICD-10-CM | POA: Diagnosis not present

## 2023-07-14 IMAGING — CR DG ANKLE COMPLETE 3+V*L*
1 series · 3 of 3 positions shown · non-contrast
Comparison: None Available.

CLINICAL DATA: Left ankle pain

EXAM:
LEFT ANKLE COMPLETE - 3+ VIEW

[Series 1: dg ankle complete left · 0.14mm/px · 3 of 3 slices shown]
[im 1/3]
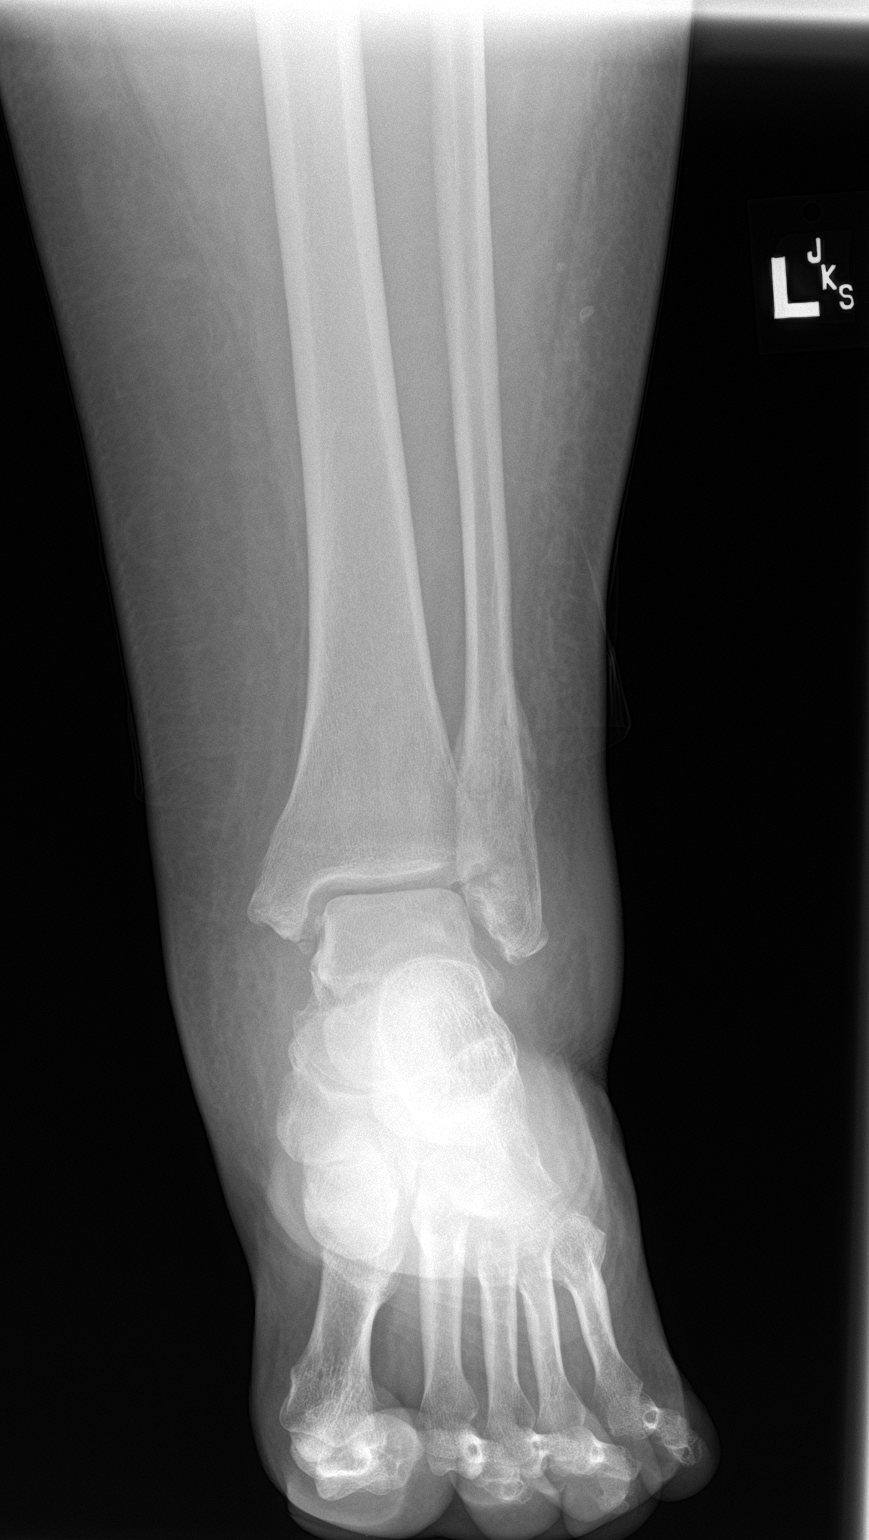
[im 2/3]
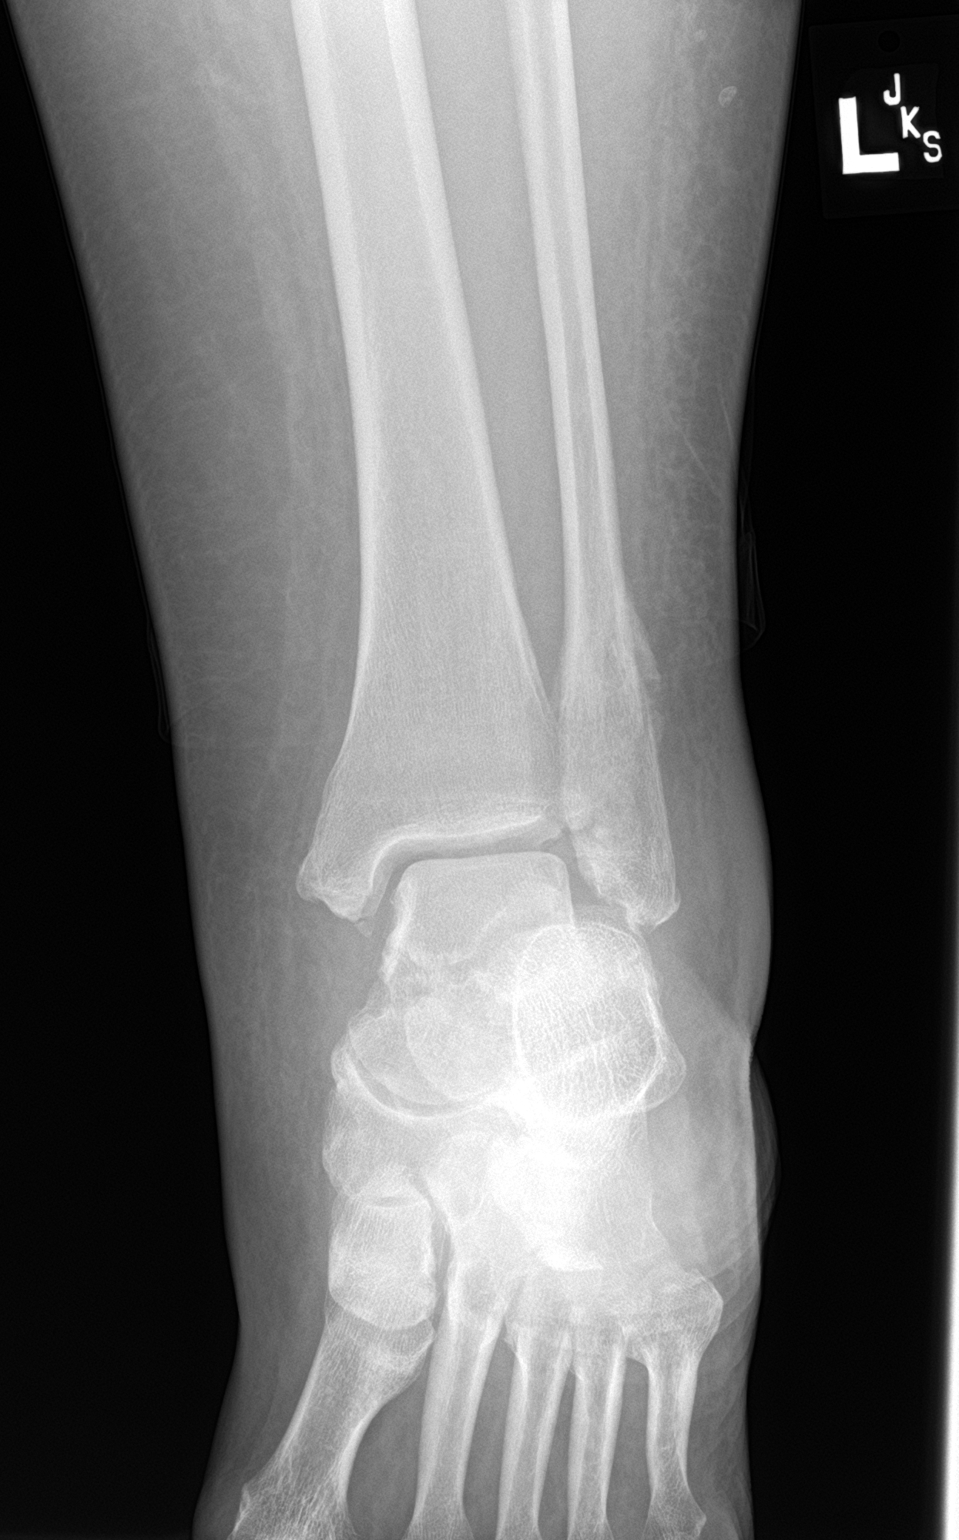
[im 3/3]
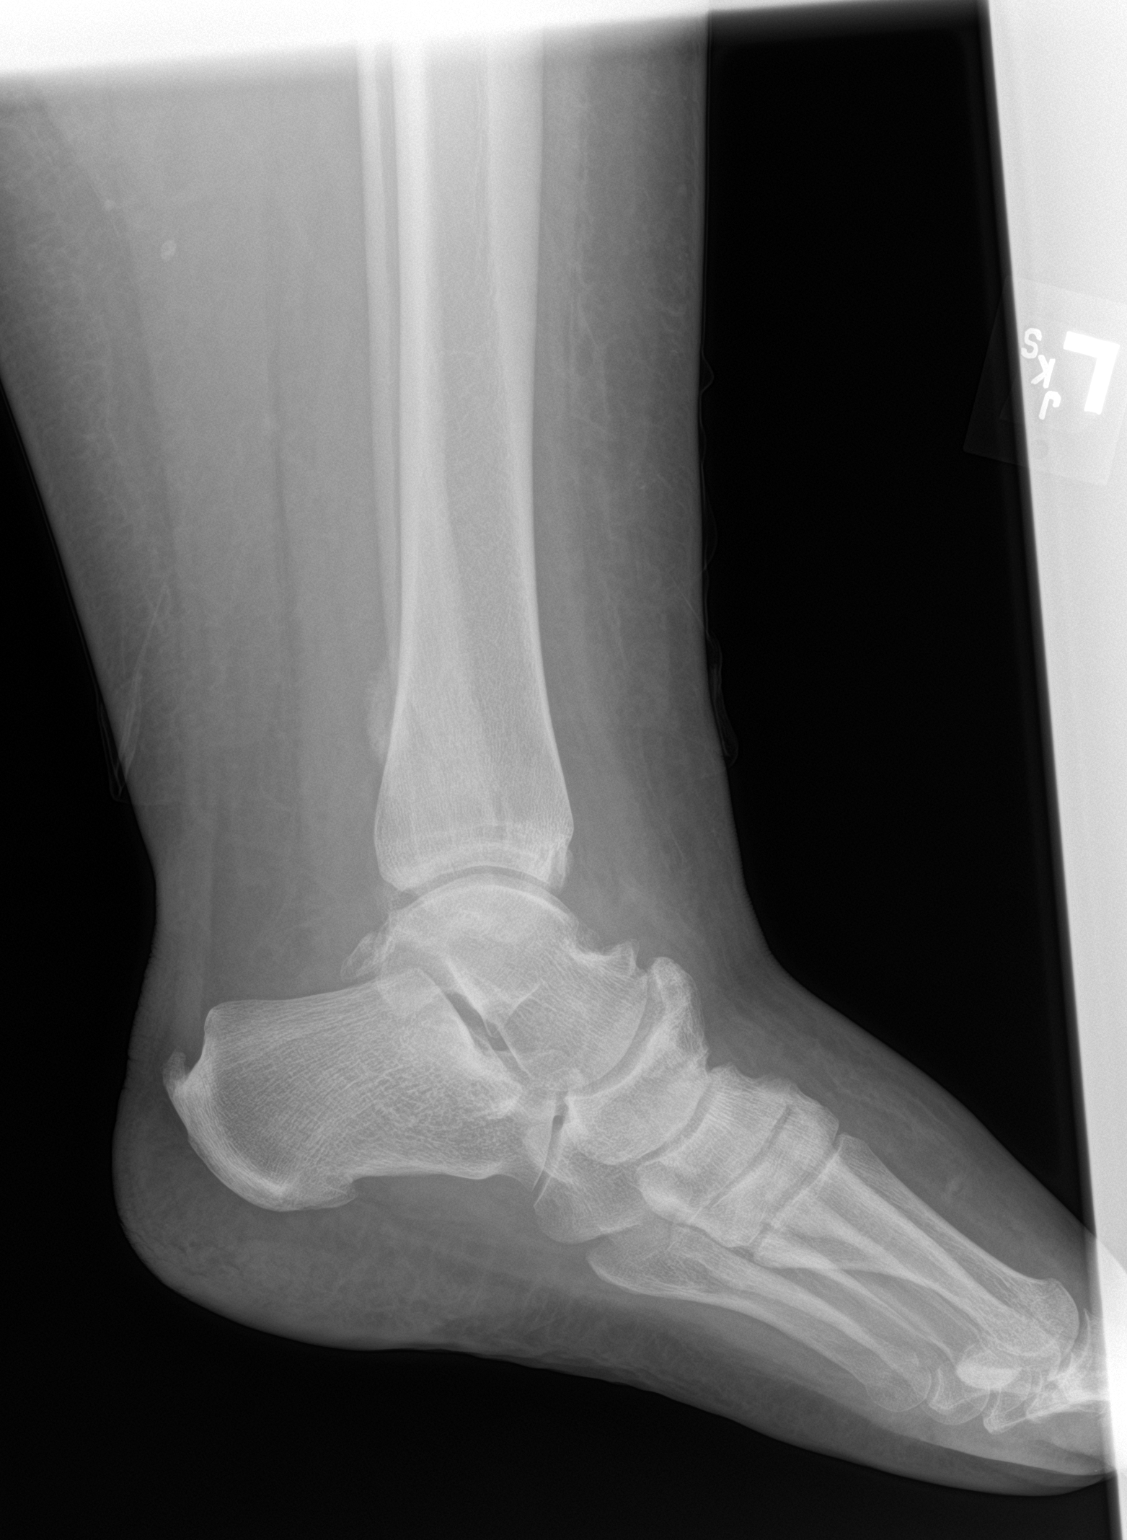

[3 of 3 positions shown; findings below may reference images not displayed]

FINDINGS: There is a healing nondisplaced oblique fracture of the distal
fibula with surrounding callus formation. No acute fracture or
dislocation identified. Small chronic bone density at the inferior
tip of the medial malleolus may be sequela of an old injury or
degenerative change. Note is made of prominent dorsal talonavicular
osteophytes as well as posterior and plantar calcaneal spurs.
Diffuse soft tissue prominence/swelling.
IMPRESSION: 1. Healing nondisplaced fracture of the distal fibula with
surrounding callus formation.
2. Chronic bony changes as described.
3. Diffuse soft tissue prominence/swelling.

## 2023-07-23 ENCOUNTER — Inpatient Hospital Stay: Attending: Oncology | Admitting: Oncology

## 2023-07-23 ENCOUNTER — Inpatient Hospital Stay

## 2023-07-23 ENCOUNTER — Encounter: Payer: Self-pay | Admitting: Oncology

## 2023-07-23 VITALS — BP 133/81 | HR 84 | Temp 97.6°F | Resp 18 | Ht 62.0 in | Wt 298.0 lb

## 2023-07-23 DIAGNOSIS — D649 Anemia, unspecified: Secondary | ICD-10-CM | POA: Insufficient documentation

## 2023-07-23 LAB — COMPREHENSIVE METABOLIC PANEL
ALT: 26 U/L (ref 0–44)
AST: 31 U/L (ref 15–41)
Albumin: 3.9 g/dL (ref 3.5–5.0)
Alkaline Phosphatase: 72 U/L (ref 38–126)
Anion gap: 13 (ref 5–15)
BUN: 11 mg/dL (ref 6–20)
CO2: 22 mmol/L (ref 22–32)
Calcium: 8.9 mg/dL (ref 8.9–10.3)
Chloride: 96 mmol/L — ABNORMAL LOW (ref 98–111)
Creatinine, Ser: 1.03 mg/dL — ABNORMAL HIGH (ref 0.44–1.00)
GFR, Estimated: >60 mL/min (ref >60)
Glucose, Bld: 189 mg/dL — ABNORMAL HIGH (ref 70–99)
Potassium: 3.3 mmol/L — ABNORMAL LOW (ref 3.5–5.1)
Sodium: 131 mmol/L — ABNORMAL LOW (ref 135–145)
Total Bilirubin: 0.5 mg/dL (ref 0.0–1.2)
Total Protein: 7.2 g/dL (ref 6.5–8.1)

## 2023-07-23 LAB — CBC WITH DIFFERENTIAL/PLATELET
Abs Immature Granulocytes: 0.04 10*3/uL (ref 0.00–0.07)
Basophils Absolute: 0 10*3/uL (ref 0.0–0.1)
Basophils Relative: 0 %
Eosinophils Absolute: 0.3 10*3/uL (ref 0.0–0.5)
Eosinophils Relative: 3 %
HCT: 31.6 % — ABNORMAL LOW (ref 36.0–46.0)
Hemoglobin: 10.1 g/dL — ABNORMAL LOW (ref 12.0–15.0)
Immature Granulocytes: 1 %
Lymphocytes Relative: 19 %
Lymphs Abs: 1.6 10*3/uL (ref 0.7–4.0)
MCH: 29.7 pg (ref 26.0–34.0)
MCHC: 32 g/dL (ref 30.0–36.0)
MCV: 92.9 fL (ref 80.0–100.0)
Monocytes Absolute: 0.4 10*3/uL (ref 0.1–1.0)
Monocytes Relative: 4 %
Neutro Abs: 6.4 10*3/uL (ref 1.7–7.7)
Neutrophils Relative %: 73 %
Platelets: 229 10*3/uL (ref 150–400)
RBC: 3.4 MIL/uL — ABNORMAL LOW (ref 3.87–5.11)
RDW: 13 % (ref 11.5–15.5)
WBC: 8.8 10*3/uL (ref 4.0–10.5)
nRBC: 0 % (ref 0.0–0.2)

## 2023-07-23 LAB — RETICULOCYTES
Immature Retic Fract: 9.4 % (ref 2.3–15.9)
RBC.: 3.33 MIL/uL — ABNORMAL LOW (ref 3.87–5.11)
Retic Count, Absolute: 48 10*3/uL (ref 19.0–186.0)
Retic Ct Pct: 1.4 % (ref 0.4–3.1)

## 2023-07-23 LAB — LACTATE DEHYDROGENASE: LDH: 138 U/L (ref 98–192)

## 2023-07-23 NOTE — Progress Notes (Signed)
 Hematology/Oncology Consult note [facility] Telephone:([phone] [phone]  Patient Care Team: [facility] as PCP - General (General Practice)   Name of the patient: Dawn Lambert  [id number]  Oct 26, 1990    Reason for referral-anemia   Referring physician-[facility]  Date of visit: 07/23/23   History of presenting illness-patient is a 33 year old female with a past medical history significant for intellectual disability, diabetes, obesity, mood disorder who is a resident of a nursing home.  She has been Was referred for evaluation of anemia.  Her baseline hemoglobin runs between 10-11.  There is a family history of thalassemia in her grandmother.  Patient's CBC showed a white cell count of 9.2, H&H of 10.6/32.1 with an MCV of 90 and a platelet count of 277.  Ferritin was mildly elevated at 308 with an iron saturation of 30%.  B12 levels normal at 467 with folate of greater than 20.  ECOG PS- 2  Pain scale- 0   Review of systems- Review of Systems  Constitutional:  Positive for malaise/fatigue. Negative for chills, fever and weight loss.  HENT:  Negative for congestion, ear discharge and nosebleeds.   Eyes:  Negative for blurred vision.  Respiratory:  Negative for cough, hemoptysis, sputum production, shortness of breath and wheezing.   Cardiovascular:  Negative for chest pain, palpitations, orthopnea and claudication.  Gastrointestinal:  Negative for abdominal pain, blood in stool, constipation, diarrhea, heartburn, melena, nausea and vomiting.  Genitourinary:  Negative for dysuria, flank pain, frequency, hematuria and urgency.  Musculoskeletal:  Negative for back pain, joint pain and myalgias.  Skin:  Negative for rash.  Neurological:  Negative for dizziness, tingling, focal weakness, seizures, weakness and headaches.  Endo/Heme/Allergies:  Does not bruise/bleed easily.  Psychiatric/Behavioral:  Negative  for depression and suicidal ideas. The patient does not have insomnia.     Allergies  Allergen Reactions   Amoxicillin-Pot Clavulanate Other (See Comments)    Has patient had a PCN reaction causing immediate rash, facial/tongue/throat swelling, SOB or lightheadedness with hypotension: ______  Has patient had a PCN reaction causing severe rash involving mucus membranes or skin necrosis: _______  Has patient had a PCN reaction that required hospitalization: _______  Has patient had a PCN reaction occurring within the last 10 years:_______  If all of the above answers are "NO", then may proceed with Cephalosporin use.  Other Reaction(s): Unknown  Other reaction(s): Other (See Comments)  Has patient had a PCN reaction causing immediate rash, facial/tongue/throat swelling, SOB or lightheadedness with hypotension: ______  Has patient had a PCN reaction causing severe rash involving mucus membranes or skin necrosis: _______  Has patient had a PCN reaction that required hospitalization: _______  Has patient had a PCN reaction occurring within the last 10 years:_______  If all of the above answers are "NO", then may proceed with Cephalosporin use.    Patient Active Problem List   Diagnosis Date Noted   Generalized anxiety disorder 01/08/2023   Episodic mood disorder (HCC) 01/08/2023   Snoring 12/04/2022   Witnessed episode of apnea 12/04/2022   Sepsis without acute organ dysfunction (HCC) 10/10/2022   Acute gastroenteritis 10/09/2022   Morbid obesity with BMI of 50.0-59.9, adult (HCC) 10/09/2022   Urinary tract infection 10/09/2022   AKI (acute kidney injury) (HCC) 10/09/2022   Hypertension 10/09/2022   Uncontrolled type 2 diabetes mellitus with hyperglycemia, without long-term current use of insulin (HCC) 10/09/2022   Hypothyroidism 10/09/2022   Joint pain 10/09/2022  Behavior concern    Depression    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    Suicidal ideation 09/19/2020   Intellectual disability 10/12/2017   Umbilical hernia without obstruction and without gangrene 08/15/2017   Chest pain 11/05/2012   Atrial septal defect 09/19/2011     Past Medical History:  Diagnosis Date   Anxiety    Arnold-Chiari malformation (HCC)    Atrial septal defect    Complication of anesthesia    when pt was 11, woke up during surgery (muscle biopsy)   Constipation    Contraception management    Dandruff    Dermatomyositis (HCC)    age 23   Diabetes mellitus without complication (HCC)    Elevated blood pressure    Hypertension    Hypothyroidism    Mental retardation    Mood disorder (HCC)    Overweight(278.02)    Pelvic pain    Prediabetes      Past Surgical History:  Procedure Laterality Date   ASD REPAIR     at age 89   UMBILICAL HERNIA REPAIR N/A 09/17/2017   Procedure: HERNIA REPAIR UMBILICAL ADULT;  Surgeon: Earline Mayotte, MD;  Location: [facility] ORS;  Service: General;  Laterality: N/A;    Social History   Socioeconomic History   Marital status: Single    Spouse name: Not on file   Number of children: Not on file   Years of education: Not on file   Highest education level: Not on file  Occupational History   Not on file  Tobacco Use   Smoking status: Never   Smokeless tobacco: Never  Vaping Use   Vaping status: Never Used  Substance and Sexual Activity   Alcohol use: No   Drug use: No   Sexual activity: Not on file  Other Topics Concern   Not on file  Social History Narrative   Not on file   Social Drivers of Health   Financial Resource Strain: Not on file  Food Insecurity: No Food Insecurity (07/23/2023)   Hunger Vital Sign    Worried About Running Out of Food in the Last Year: Never true    Ran Out of Food in the Last Year: Never true  Transportation Needs: No Transportation Needs (07/23/2023)   PRAPARE - Administrator, Civil Service (Medical): No    Lack of Transportation  (Non-Medical): No  Physical Activity: Not on file  Stress: Not on file  Social Connections: Not on file  Intimate Partner Violence: Not At Risk (07/23/2023)   Humiliation, Afraid, Rape, and Kick questionnaire    Fear of Current or Ex-Partner: No    Emotionally Abused: No    Physically Abused: No    Sexually Abused: No     History reviewed. No pertinent family history.   Current Outpatient Medications:    acetaminophen (TYLENOL) 325 MG tablet, Take 650 mg by mouth every 6 (six) hours as needed for mild pain or moderate pain., Disp: , Rfl:    albuterol (VENTOLIN HFA) 108 (90 Base) MCG/ACT inhaler, Inhale 2 puffs into the lungs every 4 (four) hours as needed., Disp: , Rfl:    bismuth subsalicylate (PEPTO BISMOL) 262 MG/15ML suspension, SMARTSIG:30 Milliliter(s) By Mouth 4 Times Daily PRN, Disp: , Rfl:    carbamazepine (TEGRETOL) 200 MG tablet, Take 400 mg by mouth 2 (two) times daily. With food, Disp: , Rfl:    cetirizine (ZYRTEC) 10 MG tablet, Take 10 mg by mouth daily  as needed for allergies., Disp: , Rfl:    cholecalciferol (VITAMIN D) 1000 units tablet, Take 1,000 Units by mouth daily. , Disp: , Rfl:    clonazePAM (KLONOPIN) 1 MG tablet, Take 1 mg by mouth 2 (two) times daily., Disp: , Rfl:    Fluocinolone Acetonide Scalp 0.01 % OIL, Apply to aa's scalp QD on Monday, Wednesday and Friday x 6 weeks. After 6 wks decrease to QW on Friday. May increase to QD on Monday, Wednesday, and Friday during times of flares., Disp: 118.28 mL, Rfl: 11   fluticasone (FLONASE) 50 MCG/ACT nasal spray, Place 1 spray into both nostrils daily as needed for allergies or rhinitis., Disp: , Rfl:    gabapentin (NEURONTIN) 800 MG tablet, Take 800 mg by mouth 3 (three) times daily., Disp: , Rfl:    Guaifenesin 1200 MG TB12, Take 1,200 mg by mouth daily as needed (Congestion)., Disp: , Rfl:    ibuprofen (ADVIL) 600 MG tablet, Take 600 mg by mouth every 6 (six) hours as needed., Disp: , Rfl:    INVEGA TRINZA 819  MG/2.63ML injection, Inject 819 mg into the muscle every 3 (three) months., Disp: , Rfl:    levothyroxine (SYNTHROID, LEVOTHROID) 175 MCG tablet, Take 175 mcg by mouth daily before breakfast., Disp: , Rfl:    lidocaine (XYLOCAINE) 2 % solution, Use as directed 15 mLs in the mouth or throat as needed for mouth pain., Disp: , Rfl:    lithium carbonate 300 MG capsule, Take 300-600 mg by mouth 2 (two) times daily. Take 300 mg by mouth every morning 600 mg At bedtime, Disp: , Rfl:    LORazepam ER (LOREEV XR) 2 MG CS24, Take 2 mg by mouth in the morning., Disp: , Rfl:    magnesium hydroxide (MILK OF MAGNESIA) 400 MG/5ML suspension, Take 30 mLs by mouth at bedtime as needed for mild constipation., Disp: , Rfl:    medroxyPROGESTERone (DEPO-PROVERA) 150 MG/ML injection, Inject into the muscle., Disp: , Rfl:    meloxicam (MOBIC) 15 MG tablet, Take 1 tablet by mouth daily., Disp: , Rfl:    Multiple Vitamin (THEREMS PO), Take 1 tablet by mouth daily. Take with food, Disp: , Rfl:    nystatin powder, Apply 1 Application topically 2 (two) times daily as needed (fungal infection). Apply under breast, Disp: , Rfl:    ondansetron (ZOFRAN-ODT) 8 MG disintegrating tablet, Take 8 mg by mouth every 8 (eight) hours as needed., Disp: , Rfl:    propranolol (INDERAL) 40 MG tablet, Take 40 mg by mouth 2 (two) times daily., Disp: , Rfl:    QUEtiapine (SEROQUEL XR) 400 MG 24 hr tablet, Take 400 mg by mouth at bedtime., Disp: , Rfl:    QUEtiapine (SEROQUEL) 50 MG tablet, Take 50 mg by mouth daily. At 2pm., Disp: , Rfl:    TRULICITY 3 MG/0.5ML SOPN, Inject 3 mg into the skin once a week. Wednesday, Disp: , Rfl:    Physical exam:  Vitals:   07/23/23 1331  BP: 133/81  Pulse: 84  Resp: 18  Temp: 97.6 F (36.4 C)  TempSrc: Tympanic  SpO2: 100%  Weight: 298 lb (135.2 kg)  Height: 5\' 2"  (1.575 m)   Physical Exam Cardiovascular:     Rate and Rhythm: Normal rate and regular rhythm.     Heart sounds: Normal heart sounds.   Pulmonary:     Effort: Pulmonary effort is normal.     Breath sounds: Normal breath sounds.  Abdominal:     General:  Bowel sounds are normal.     Palpations: Abdomen is soft.  Skin:    General: Skin is warm and dry.  Neurological:     Mental Status: She is alert and oriented to person, place, and time.           Latest Ref Rng & Units 07/23/2023    2:13 PM  CMP  Glucose 70 - 99 mg/dL 601   BUN 6 - 20 mg/dL 11   Creatinine 0.93 - 1.00 mg/dL 2.35   Sodium 573 - 220 mmol/L 131   Potassium 3.5 - 5.1 mmol/L 3.3   Chloride 98 - 111 mmol/L 96   CO2 22 - 32 mmol/L 22   Calcium 8.9 - 10.3 mg/dL 8.9   Total Protein 6.5 - 8.1 g/dL 7.2   Total Bilirubin 0.0 - 1.2 mg/dL 0.5   Alkaline Phos 38 - 126 U/L 72   AST 15 - 41 U/L 31   ALT 0 - 44 U/L 26       Latest Ref Rng & Units 07/23/2023    2:13 PM  CBC  WBC 4.0 - 10.5 K/uL 8.8   Hemoglobin 12.0 - 15.0 g/dL 25.4   Hematocrit 27.0 - 46.0 % 31.6   Platelets 150 - 400 K/uL 229     No images are attached to the encounter.  No results found.  Assessment and plan- Patient is a 33 y.o. female referred for normocytic anemia.  Normocytic anemia likely secondary to chronic disease.  Baseline hemoglobin runs between 10-11 and currently levels are presently at her baseline.  I will do a complete anemia workup Today including reticulocyte count lactoglobulin myeloma panel serum free light chains as well as thalassemia testing.  She has already had iron studies B12 and folate checked which were within normal limits.  CBC ferritin and iron studies in 3 and 6 months and I will see her back in 6 months.  I will call her mother with the results of her blood work today   Thank you for this kind referral and the opportunity to participate in the care of this  Patient   Visit Diagnosis 1. Normocytic anemia     Dr. Owens Shark, MD, MPH [facility] [phone] 07/23/2023

## 2023-07-24 LAB — KAPPA/LAMBDA LIGHT CHAINS
Kappa free light chain: 19.3 mg/L (ref 3.3–19.4)
Kappa, lambda light chain ratio: 0.71 (ref 0.26–1.65)
Lambda free light chains: 27.2 mg/L — ABNORMAL HIGH (ref 5.7–26.3)

## 2023-07-24 LAB — HAPTOGLOBIN: Haptoglobin: 243 mg/dL (ref 33–278)

## 2023-07-25 LAB — MULTIPLE MYELOMA PANEL, SERUM
Albumin SerPl Elph-Mcnc: 3.6 g/dL (ref 2.9–4.4)
Albumin/Glob SerPl: 1.1 (ref 0.7–1.7)
Alpha 1: 0.2 g/dL (ref 0.0–0.4)
Alpha2 Glob SerPl Elph-Mcnc: 1 g/dL (ref 0.4–1.0)
B-Globulin SerPl Elph-Mcnc: 1.1 g/dL (ref 0.7–1.3)
Gamma Glob SerPl Elph-Mcnc: 1 g/dL (ref 0.4–1.8)
Globulin, Total: 3.3 g/dL (ref 2.2–3.9)
IgA: 272 mg/dL (ref 87–352)
IgG (Immunoglobin G), Serum: 1124 mg/dL (ref 586–1602)
IgM (Immunoglobulin M), Srm: 65 mg/dL (ref 26–217)
Total Protein ELP: 6.9 g/dL (ref 6.0–8.5)

## 2023-07-26 ENCOUNTER — Other Ambulatory Visit: Payer: Self-pay

## 2023-07-26 DIAGNOSIS — D649 Anemia, unspecified: Secondary | ICD-10-CM

## 2023-07-26 LAB — HGB FRACTIONATION CASCADE
Hgb A2: 2.7 % (ref 1.8–3.2)
Hgb A: 97.3 % (ref 96.4–98.8)
Hgb F: 0 % (ref 0.0–2.0)
Hgb S: 0 %

## 2023-08-29 ENCOUNTER — Ambulatory Visit: Payer: 59 | Admitting: Dermatology

## 2023-09-26 ENCOUNTER — Other Ambulatory Visit: Payer: Self-pay

## 2023-09-26 ENCOUNTER — Emergency Department
Admission: EM | Admit: 2023-09-26 | Discharge: 2023-09-27 | Disposition: A | Discharge status: Other Acute Inpatient Hospital | Attending: Emergency Medicine | Admitting: Emergency Medicine

## 2023-09-26 DIAGNOSIS — F79 Unspecified intellectual disabilities: Secondary | ICD-10-CM | POA: Diagnosis not present

## 2023-09-26 DIAGNOSIS — R45850 Homicidal ideations: Secondary | ICD-10-CM | POA: Diagnosis not present

## 2023-09-26 DIAGNOSIS — E039 Hypothyroidism, unspecified: Secondary | ICD-10-CM | POA: Diagnosis not present

## 2023-09-26 DIAGNOSIS — F913 Oppositional defiant disorder: Secondary | ICD-10-CM | POA: Diagnosis not present

## 2023-09-26 DIAGNOSIS — R4689 Other symptoms and signs involving appearance and behavior: Secondary | ICD-10-CM | POA: Diagnosis present

## 2023-09-26 DIAGNOSIS — F251 Schizoaffective disorder, depressive type: Secondary | ICD-10-CM | POA: Diagnosis present

## 2023-09-26 DIAGNOSIS — I10 Essential (primary) hypertension: Secondary | ICD-10-CM | POA: Insufficient documentation

## 2023-09-26 DIAGNOSIS — R45851 Suicidal ideations: Secondary | ICD-10-CM | POA: Diagnosis not present

## 2023-09-26 DIAGNOSIS — F259 Schizoaffective disorder, unspecified: Secondary | ICD-10-CM | POA: Diagnosis present

## 2023-09-26 DIAGNOSIS — E119 Type 2 diabetes mellitus without complications: Secondary | ICD-10-CM | POA: Insufficient documentation

## 2023-09-26 DIAGNOSIS — F258 Other schizoaffective disorders: Secondary | ICD-10-CM | POA: Diagnosis not present

## 2023-09-26 LAB — COMPREHENSIVE METABOLIC PANEL WITH GFR
ALT: 18 U/L (ref 0–44)
AST: 21 U/L (ref 15–41)
Albumin: 3.6 g/dL (ref 3.5–5.0)
Alkaline Phosphatase: 70 U/L (ref 38–126)
Anion gap: 11 (ref 5–15)
BUN: 15 mg/dL (ref 6–20)
CO2: 23 mmol/L (ref 22–32)
Calcium: 9.2 mg/dL (ref 8.9–10.3)
Chloride: 103 mmol/L (ref 98–111)
Creatinine, Ser: 1.08 mg/dL — ABNORMAL HIGH (ref 0.44–1.00)
GFR, Estimated: >60 mL/min (ref >60)
Glucose, Bld: 233 mg/dL — ABNORMAL HIGH (ref 70–99)
Potassium: 3.4 mmol/L — ABNORMAL LOW (ref 3.5–5.1)
Sodium: 137 mmol/L (ref 135–145)
Total Bilirubin: 0.5 mg/dL (ref 0.0–1.2)
Total Protein: 6.9 g/dL (ref 6.5–8.1)

## 2023-09-26 LAB — ACETAMINOPHEN LEVEL: Acetaminophen (Tylenol), Serum: <10 ug/mL — ABNORMAL LOW (ref 10–30)

## 2023-09-26 LAB — CBC
HCT: 29.3 % — ABNORMAL LOW (ref 36.0–46.0)
Hemoglobin: 9.9 g/dL — ABNORMAL LOW (ref 12.0–15.0)
MCH: 30.8 pg (ref 26.0–34.0)
MCHC: 33.8 g/dL (ref 30.0–36.0)
MCV: 91.3 fL (ref 80.0–100.0)
Platelets: 232 10*3/uL (ref 150–400)
RBC: 3.21 MIL/uL — ABNORMAL LOW (ref 3.87–5.11)
RDW: 12.7 % (ref 11.5–15.5)
WBC: 10.9 10*3/uL — ABNORMAL HIGH (ref 4.0–10.5)
nRBC: 0 % (ref 0.0–0.2)

## 2023-09-26 LAB — ETHANOL: Alcohol, Ethyl (B): <15 mg/dL (ref <15)

## 2023-09-26 LAB — LIPASE, BLOOD: Lipase: 38 U/L (ref 11–51)

## 2023-09-26 LAB — SALICYLATE LEVEL: Salicylate Lvl: <7 mg/dL — ABNORMAL LOW (ref 7.0–30.0)
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
## 2023-09-26 NOTE — ED Notes (Signed)
 Unable to get labs at this time, phlebotomy called.

## 2023-09-26 NOTE — ED Notes (Addendum)
 Pt belongings:  White tshirt Multi color pants pink bra Pink sandals  3 grey rings Cheetah print panties
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
## 2023-09-26 NOTE — ED Notes (Signed)
Lab at the bedside to obtain blood work
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
## 2023-09-27 ENCOUNTER — Encounter: Payer: Self-pay | Admitting: Psychiatry

## 2023-09-27 DIAGNOSIS — F913 Oppositional defiant disorder: Secondary | ICD-10-CM | POA: Diagnosis not present

## 2023-09-27 DIAGNOSIS — R45850 Homicidal ideations: Secondary | ICD-10-CM | POA: Diagnosis not present

## 2023-09-27 DIAGNOSIS — F258 Other schizoaffective disorders: Secondary | ICD-10-CM | POA: Diagnosis not present

## 2023-09-27 DIAGNOSIS — R45851 Suicidal ideations: Secondary | ICD-10-CM

## 2023-09-27 DIAGNOSIS — F79 Unspecified intellectual disabilities: Secondary | ICD-10-CM | POA: Diagnosis not present

## 2023-09-27 LAB — URINALYSIS, ROUTINE W REFLEX MICROSCOPIC
Bilirubin Urine: NEGATIVE
Glucose, UA: NEGATIVE mg/dL
Ketones, ur: NEGATIVE mg/dL
Nitrite: NEGATIVE
Protein, ur: NEGATIVE mg/dL
Specific Gravity, Urine: 1.011 (ref 1.005–1.030)
pH: 5 (ref 5.0–8.0)

## 2023-09-27 LAB — URINE DRUG SCREEN, QUALITATIVE (ARMC ONLY)
Amphetamines, Ur Screen: NOT DETECTED
Barbiturates, Ur Screen: NOT DETECTED
Benzodiazepine, Ur Scrn: POSITIVE — AB
Cannabinoid 50 Ng, Ur ~~LOC~~: NOT DETECTED
Cocaine Metabolite,Ur ~~LOC~~: NOT DETECTED
MDMA (Ecstasy)Ur Screen: NOT DETECTED
Methadone Scn, Ur: NOT DETECTED
Opiate, Ur Screen: NOT DETECTED
Phencyclidine (PCP) Ur S: NOT DETECTED
Tricyclic, Ur Screen: POSITIVE — AB

## 2023-09-27 LAB — LITHIUM LEVEL: Lithium Lvl: 0.8 mmol/L (ref 0.60–1.20)

## 2023-09-27 LAB — POC URINE PREG, ED: Preg Test, Ur: NEGATIVE

## 2023-09-27 LAB — CARBAMAZEPINE LEVEL, TOTAL: Carbamazepine Lvl: 7.7 ug/mL (ref 4.0–12.0)

## 2023-09-27 MED ORDER — LORAZEPAM 2 MG/ML IJ SOLN
2.0000 mg | Freq: Once | INTRAMUSCULAR | Status: DC
  Filled 2023-09-27: qty 1

## 2023-09-27 MED ORDER — CLONAZEPAM 1 MG PO TABS
1.0000 mg | ORAL_TABLET | ORAL | Status: AC
  Administered 2023-09-27: 1 mg via ORAL
  Filled 2023-09-27: qty 1

## 2023-09-27 MED ORDER — ONDANSETRON 4 MG PO TBDP
8.0000 mg | ORAL_TABLET | Freq: Three times a day (TID) | ORAL | Status: DC | PRN
  Administered 2023-09-27: 8 mg via ORAL
  Filled 2023-09-27: qty 2

## 2023-09-27 MED ORDER — DIPHENHYDRAMINE HCL 50 MG/ML IJ SOLN
50.0000 mg | Freq: Once | INTRAMUSCULAR | Status: DC
  Filled 2023-09-27: qty 1

## 2023-09-27 MED ORDER — HALOPERIDOL LACTATE 5 MG/ML IJ SOLN
5.0000 mg | Freq: Once | INTRAMUSCULAR | Status: DC
  Filled 2023-09-27: qty 1

## 2023-09-27 NOTE — ED Notes (Signed)
 Pt banging her head against window at this time. Pt not responding to verbal de-escalation. MD made aware.
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
## 2023-09-27 NOTE — Progress Notes (Signed)
   09/27/23 1045  Spiritual Encounters  Type of Visit Initial  Care provided to: Patient  Conversation partners present during encounter Nurse  Reason for visit Routine spiritual support  OnCall Visit Yes   Chaplain was on the Unit to see another patient but warmly greeted patient, who was also in the space.  Patient matched the Chaplain's expression and acknowledged her in the space.  When Chaplain left the space, patient said goodbye.    Rev. Rana M. Nolon Baxter, M.Div. Chaplain Resident  [facility]

## 2023-09-27 NOTE — ED Notes (Signed)
Pt received breakfast 

## 2023-09-27 NOTE — Consult Note (Signed)
 [facility] Psychiatric Consult Follow-up-Final Disposition   Patient Name: .Dawn Lambert  MRN: [id number]  DOB: [birth year]  Consult Order details:   TTS-Consult -psych evaluation      Mode of Visit: Tele-visit Virtual Statement:TELE PSYCHIATRY ATTESTATION & CONSENT As the provider for this telehealth consult, I attest that I verified the patient's identity using two separate identifiers, introduced myself to the patient, provided my credentials, disclosed my location, and performed this encounter via a HIPAA-compliant, real-time, face-to-face, two-way, interactive audio and video platform and with the full consent and agreement of the patient (or guardian as applicable.) Patient physical location: Home Office in [place] . Telehealth provider physical location: home office in state of [place] .   Video start time: 1010  Video end time: 10:35  Present during telepsychiatric encounter:  Provider: Cydney Draft, NP Counselor: Darlina Einstein, licensed counselor Patient: Dawn Lambert     Psychiatry Consult Evaluation  Service Date: Sep 27, 2023 LOS:  LOS: 1 days  Chief Complaint : "They wouldn't give me my snack"  Primary Psychiatric Diagnoses  Intellectual Disability  2.   Oppositional Behaviors 3.   Schizoaffective Disorder  Assessment  Dawn Lambert is a 33 y.o. female admitted: Presented to the EDfor 09/26/2023  9:31 PM under IVC petition due to making suicidal threats with a plan to lay in the road after staff member at the group home refused to give her an extra snack after dinner. Patient is familiar to the behavior health service line and has present the [facility] previously with a similar presentation threatening to lay in the road to be struck by an automobile after conflict with staff member at group home and desire not to return back to group home. Patient 's last presentation to [facility] ED-for behavioral health associated complaint was 01/07/2023 which were suicidal  ideations with a plan to lay in the road.     She carries the psychiatric diagnoses of IDD, schizoaffective disorder, depression, oppositional behaviors and  has a past medical history of  Hypertension, Type 2 Diabetes, and morbid obesity.   Her current presentation of threats of suicidality due to unmet needs is most consistent with prior presentations here at [facility] emergency department.  Patient has a legal guardian and resides at a group home due to limited intellectual ability.  She does not meets criteria for inpatient treatment based on behaviors which are associated with inability to regulate emotional responses and behaviors when limits and restrictions are in place by group home staff.  Patient has limited capacity and limited cognitive ability to program in an inpatient setting. These are behaviors which are often observed in patients with IDD are not treatable within an inpatient psychiatric acute care setting.   Current outpatient psychotropic medications include (home medications not reconciled during this brief ER admission).  Patient has as needed agitation medications ordered this admission.  She was compliant with medications prior to admission as evidenced by it has been greater than 6 months since patient has had a behavioral outburst requiring an ER evaluation. On initial examination, patient is observed sitting on the bed . Please see plan below for detailed recommendations.   Diagnoses:  Active Hospital problems: Active Problems:   Schizoaffective disorder, depressive type (HCC)   Oppositional defiant disorder   Homicidal ideations    Plan   Psychiatric Medication Recommendations:   Medical Decision Making Capacity: Patient has a guardian and has thus been adjudicated incompetent; please involve patients guardian in medical decision making, guardian notified  Further Work-up: UA, deferred to EDP for review and management if warranted  -- U/A or UDS -- most recent EKG  none ordered -- Pertinent labwork reviewed earlier this admission includes: UA abnormal-deferred ot EDP to review    Disposition:-- There are no psychiatric contraindications to discharge at this time.  Group home will continue with current outpatient provider for psychiatric medication management and behavioral modification therapy.  Behavioral / Environmental: - No specific recommendations at this time.       CSSR Risk Category:C-SSRS RISK CATEGORY: Minimal  Risk  Suicide Risk Assessment: Patient has following modifiable risk factors for suicide: impulsivity, which we are addressing by safety planning with group home staff. Patient has following non-modifiable or demographic risk factors for suicide: None Patient has the following protective factors against suicide: Access to outpatient mental health care and no history of suicide attempts, controlled group home setting with supervision.   Thank you for this consult request. Recommendations have been communicated to the primary team.   Patient does not need inpatient psychiatric treatment criteria and will be discharged back home to group home.  Legal guardian has been notified. Cydney Draft, NP       History of Present Illness  Relevant Aspects of Hospital ED Course:   Admitted on 09/26/2023 for suicidal ideations in context of being  refused a snack by group home employee.   Patient Report:  Per ED provider admission HPI:  Dawn Lambert is a 33 y.o. female with past medical history of hypertension, diabetes, intellectual disability, and depression who presents to the ED for psychiatric evaluation.  Patient reportedly got upset with staff at her group home earlier this evening after they would not give her additional food.  Per IVC paperwork, she then ran into the street and laid down in the road, stating that she wanted to get hit by a car.  Patient continues to state that she is having thoughts of harming herself by being  struck by a car.  She complains of some upper abdominal pain, denies any nausea or vomiting.  She was placed under IVC prior to arrival.   On reevaluation today Clarity is sitting on her bedside and reports that this Clinical research associate that she was brought to the emergency department after a staff member at the group home refused to give her a snack and told her she had already eaten 7 times throughout the day and she can no longer have anything else to eat.  Patient reported that she wanted a pack of nebs or beef jerky and was told no.  She reported became angry and reports that she went outside and laid in the road.  Patient reported that she wanted to get struck by a vehicle.  On chart review patient has presented to the ED here at [facility] multiple times with the same behaviors after being told no or engage in a conflict with the group home staff member.  Patient tells this Clinical research associate that she wants to go into a hospital or stay in the emergency department but does not want to go back to group home.  Patient reports she would consider going back to group home and not going to lay in the road, "If can have some of the volunteer stuff".  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  Patient states I want 1 of those stuffed animal teddy bears that the  volunteer workers have at the hospital and a cook then I want go and lay in the road."  When Rolla explained to patient that he would try to get a teddy bear but could not guarantee that he could get this item for her patient stood up during encounter and began to bang on the wall this writer and Dawn Lambert were able to redirect patient to sit back on the bed and complete evaluation.  Patient was able to verbalize an ability to let staff members know if she is getting upset and concluded that she wanted a teddy bear and this would keep her from laying in the road.  Calvin  contacted legal guardian who is patient's mother who advised that she consents for patient to return back to group home and has no safety concerns. Pershing [employer] contacted group  home and secured collateral and discussed in any safety concern in regards to the patient at the group home and staff member reported no acute concerns and that patient was able to return back to the group home setting.  Psych ROS:  Depression: None  Anxiety:  present  Mania (lifetime and current): none  Psychosis: (lifetime and current): none   Collateral information:    Review of Systems  Unable to perform ROS: Mental acuity     Psychiatric and Social History  Psychiatric History:  Information collected from :   09/27/2023-10:000 AM Per Macky Sayres [facility] contacted: [facility] to confirm patient is able to return. Rosa [employer] manager) confirmed that patient is welcome to return to group home after discharge   09/27/23-9:39 AM Darlina Einstein, Counselor spoke with TTS received return phone call from patient's mother Ahmad Hotter [phone]). She's aware the patient is in the ER. She states this is common behaviors for her. When she is psych cleared she's to returned to the group home.   Prev Dx/Sx: IDD, schizoaffective disorder with depression, oppositional behaviors Current Psych Provider: [facility] Home Meds (current):  Meds ordered this encounter  Medications   ondansetron  (ZOFRAN -ODT) disintegrating tablet 8 mg   diphenhydrAMINE  (BENADRYL ) injection 50 mg   LORazepam  (ATIVAN ) injection 2 mg   haloperidol lactate (HALDOL) injection 5 mg   clonazePAM  (KLONOPIN ) tablet 1 mg   Pharmacy did not reconcile patient's home meds during this encounter.  Previous Med Trials: [facility] Therapy: ALF Prior Psych Hospitalization: None per chart review   Prior Self Harm: None per chart review  Prior Violence: None per chart review   Family Psych History: None reported Family Hx suicide: None  reported  Social History:  Developmental Hx: IDD Educational Hx: None reported Occupational Hx: None reported Legal Hx: No reported legal concerns Living Situation: Group home Spiritual Hx: Unable to assess Access to weapons/lethal means: No access    Substance History-no substance use history or use   Exam Findings  Physical Exam:  Vital Signs:  Temp:  [98.2 F (36.8 C)-98.6 F (37 C)] 98.6 F (37 C) (05/29 0816) Pulse Rate:  [78-85] 78 (05/29 0816) Resp:  [18] 18 (05/29 0816) BP: (132-143)/(77-83) 132/77 (05/29 0816) SpO2:  [100 %] 100 % (05/29 0816) Weight:  [129.7 kg] 129.7 kg (05/28 2124) Blood pressure 132/77, pulse 78, temperature 98.6 F (37 C), temperature source Oral, resp. rate 18, height 5\' 2"  (1.575 m), weight 129.7 kg, SpO2 100%. Body mass index is 52.31 kg/m.  Physical Exam Constitutional:      Appearance: She is obese.  HENT:     Head: Normocephalic.  Musculoskeletal:  General: Normal range of motion.     Cervical back: Normal range of motion.  Neurological:     General: No focal deficit present.     Mental Status: She is alert and oriented to person, place, and time.     Mental Status Exam: General Appearance: Appropriate for situaiton   Orientation:  Full (Time, Place, and Person)  Memory:  UTA due to IDD  Concentration:  Concentration: Poor  Recall:  Fair  Attention  Poor  Eye Contact:  Fair  Speech:  Normal Rate  Language:  Fair  Volume:  Normal  Mood: Labile  Affect:  Blunt  Thought Process:  Coherent  Thought Content:  WDL  Suicidal Thoughts:  Yes.  without intent/plan (see HPI)  Homicidal Thoughts:  No   Judgement:  Impaired-patient has limited judgment and lacks capacity therefore is assigned a legal guardian given her inability to make appropriate judgment and decisions independently.  Insight:  Lacking  Psychomotor Activity:  Negative  Akathisia:  NA  Fund of Knowledge:  Below average      Assets:  Housing   Cognition:  Impaired,  Severe  ADL's:  Intact  AIMS (if indicated):        Other History   These have been pulled in through the EMR, reviewed, and updated if appropriate.  Family History:  The patient's family history is not on file. 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[facility])    age 32   Diabetes mellitus without complication (HCC)    Elevated blood pressure    Hypertension    Hypothyroidism    Mental retardation    Mood disorder (HCC)    Overweight(278.02)    Pelvic pain    Prediabetes     Surgical History: Past Surgical History:  Procedure Laterality Date   ASD REPAIR     at age 61   UMBILICAL HERNIA REPAIR N/A 09/17/2017   Procedure: HERNIA REPAIR UMBILICAL ADULT;  Surgeon: Marshall Skeeter, MD;  Location: [facility] ORS;  Service: General;  Laterality: N/A;     Medications:   Current Facility-Administered Medications:    diphenhydrAMINE  (BENADRYL ) injection 50 mg, 50 mg, Intramuscular, Once, Bradler, Evan K, MD   haloperidol  lactate (HALDOL ) injection 5 mg, 5 mg, Intramuscular, Once, Bradler, Evan K, MD   LORazepam  (ATIVAN ) injection 2 mg, 2 mg, Intramuscular, Once, Bradler, Evan K, MD   ondansetron  (ZOFRAN -ODT) disintegrating tablet 8 mg, 8 mg, Oral, Q8H PRN, Bradler, Evan K, MD  Current Outpatient Medications:    acetaminophen  (TYLENOL ) 325 MG tablet, Take 650 mg by mouth every 6 (six) hours as needed for mild pain or moderate pain., Disp: , Rfl:    carbamazepine  (TEGRETOL ) 200 MG tablet, Take 400 mg by mouth 2 (two) times daily. With food, Disp: , Rfl:    cholecalciferol  (VITAMIN D ) 1000 units tablet, Take 1,000 Units by mouth daily. , Disp: , Rfl:    clonazePAM  (KLONOPIN ) 1 MG tablet, Take 1 mg  by mouth 2 (two) times daily., Disp: , Rfl:    gabapentin  (NEURONTIN ) 800 MG tablet, Take 800 mg by mouth 3 (three) times daily., Disp: , Rfl:    levothyroxine  (SYNTHROID , LEVOTHROID)  175 MCG tablet, Take 175 mcg by mouth daily before breakfast., Disp: , Rfl:    lisinopril  (ZESTRIL ) 10 MG tablet, Take 10 mg by mouth daily., Disp: , Rfl:    lithium  carbonate 300 MG capsule, Take 300-600 mg by mouth 2 (two) times daily. Take 300 mg by mouth every morning 600 mg At bedtime, Disp: , Rfl:    LORazepam  ER (LOREEV XR ) 2 MG CS24, Take 2 mg by mouth in the morning., Disp: , Rfl:    meloxicam  (MOBIC ) 15 MG tablet, Take 1 tablet by mouth daily., Disp: , Rfl:    propranolol  (INDERAL ) 40 MG tablet, Take 40 mg by mouth 2 (two) times daily., Disp: , Rfl:    QUEtiapine  (SEROQUEL  XR) 400 MG 24 hr tablet, Take 400 mg by mouth at bedtime., Disp: , Rfl:    QUEtiapine  (SEROQUEL ) 50 MG tablet, Take 50 mg by mouth daily. At 2pm., Disp: , Rfl:    albuterol  (VENTOLIN  HFA) 108 (90 Base) MCG/ACT inhaler, Inhale 2 puffs into the lungs every 4 (four) hours as needed. (Patient not taking: Reported on 09/27/2023), Disp: , Rfl:    bismuth subsalicylate (PEPTO BISMOL) 262 MG/15ML suspension, SMARTSIG:30 Milliliter(s) By Mouth 4 Times Daily PRN, Disp: , Rfl:    cetirizine (ZYRTEC) 10 MG tablet, Take 10 mg by mouth daily as needed for allergies., Disp: , Rfl:    Fluocinolone  Acetonide Scalp 0.01 % OIL, Apply to aa's scalp QD on Monday, Wednesday and Friday x 6 weeks. After 6 wks decrease to QW on Friday. May increase to QD on Monday, Wednesday, and Friday during times of flares., Disp: 118.28 mL, Rfl: 11   fluticasone  (FLONASE ) 50 MCG/ACT nasal spray, Place 1 spray into both nostrils daily as needed for allergies or rhinitis. (Patient not taking: Reported on 09/27/2023), Disp: , Rfl:    Guaifenesin  1200 MG TB12, Take 1,200 mg by mouth daily as needed (Congestion)., Disp: , Rfl:    ibuprofen  (ADVIL ) 600 MG tablet, Take 600 mg by mouth  every 6 (six) hours as needed. (Patient not taking: Reported on 09/27/2023), Disp: , Rfl:    INVEGA  TRINZA 819 MG/2.63ML injection, Inject 819 mg into the muscle every 3 (three) months. (Patient not taking: Reported on 09/27/2023), Disp: , Rfl:    lidocaine  (XYLOCAINE ) 2 % solution, Use as directed 15 mLs in the mouth or throat as needed for mouth pain., Disp: , Rfl:    magnesium  hydroxide (MILK OF MAGNESIA) 400 MG/5ML suspension, Take 30 mLs by mouth at bedtime as needed for mild constipation., Disp: , Rfl:    medroxyPROGESTERone (DEPO-PROVERA) 150 MG/ML injection, Inject into the muscle. (Patient not taking: Reported on 09/27/2023), Disp: , Rfl:    Multiple Vitamin (THEREMS PO), Take 1 tablet by mouth daily. Take with food, Disp: , Rfl:    nystatin  powder, Apply 1 Application topically 2 (two) times daily as needed (fungal infection). Apply under breast, Disp: , Rfl:    ondansetron  (ZOFRAN -ODT) 8 MG disintegrating tablet, Take 8 mg by mouth every 8 (eight) hours as needed. (Patient not taking: Reported on 09/27/2023), Disp: , Rfl:    TRULICITY  3 MG/0.5ML SOPN, Inject 3 mg into the skin once a week. Wednesday (Patient not taking: Reported on 09/27/2023), Disp: , Rfl:   Allergies: Allergies  Allergen Reactions   Amoxicillin-Pot Clavulanate Other (See Comments)    Has patient had a PCN reaction causing immediate rash, facial/tongue/throat swelling, SOB or lightheadedness with hypotension: ______  Has patient had a PCN reaction causing severe  rash involving mucus membranes or skin necrosis: _______  Has patient had a PCN reaction that required hospitalization: _______  Has patient had a PCN reaction occurring within the last 10 years:_______  If all of the above answers are "NO", then may proceed with Cephalosporin use.  Other Reaction(s): Unknown  Other reaction(s): Other (See Comments)  Has patient had a PCN reaction causing immediate rash, facial/tongue/throat swelling, SOB or lightheadedness  with hypotension: ______  Has patient had a PCN reaction causing severe rash involving mucus membranes or skin necrosis: _______  Has patient had a PCN reaction that required hospitalization: _______  Has patient had a PCN reaction occurring within the last 10 years:_______  If all of the above answers are "NO", then may proceed with Cephalosporin use.    Cydney Draft, NP

## 2023-09-27 NOTE — ED Provider Notes (Signed)
 Emergency Medicine Observation Re-evaluation Note  Dawn Lambert is a 33 y.o. female, seen on rounds today.  Pt initially presented to the ED for complaints of Psychiatric Evaluation  Currently, the patient is resting comfortably.  Physical Exam  BP 132/77 (BP Location: Right Arm)   Pulse 78   Temp 98.6 F (37 C) (Oral)   Resp 18   Ht 5\' 2"  (1.575 m)   Wt 129.7 kg   SpO2 100%   BMI 52.31 kg/m  General: No acute distress Cardiac: Well-perfused extremities Lungs: No respiratory distress Psych: Appropriate mood and affect  ED Course / MDM  EKG:   I have reviewed the labs performed to date as well as medications administered while in observation.  Recent changes in the last 24 hours include none.  Plan  Current plan is for placement.   Charleen Conn, MD 09/27/23 [phone]

## 2023-09-27 NOTE — BH Assessment (Signed)
 TTS received return phone call from patient's mother Dawn Lambert). She's aware the patient is in the ER. She states this is common behaviors for her. When she is psych cleared she's to returned to the group home.

## 2023-09-27 NOTE — ED Notes (Signed)
 RN spoke with legal guardian Denina Rieger and updated about pt going back to group home.

## 2023-09-27 NOTE — ED Notes (Signed)
 Pt not banging head on wall at this time. Pt stating "I am going to start banging my head on the wall again." Pt not hitting head on wall at this time.

## 2023-09-27 NOTE — Progress Notes (Signed)
 Per Cydney Draft, NP,  [facility] contacted [facility] to confirm patient is able to return.  Rosa [employer] manager) confirmed that patient is welcome to return to group home after discharge.  [place], [facility] [phone]
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
## 2023-09-27 NOTE — ED Notes (Signed)
 IVC  PAPERS  RESCINDED  PER  DR  Alejo Amsler  MD  Bubba Carbo  RN

## 2023-09-27 NOTE — ED Notes (Addendum)
 RN called group home about when pt can be picked up. Per group home, pt can be picked up at 1400 today.

## 2023-09-27 NOTE — ED Notes (Signed)
 Received pt from the main ER.  Pt informed of cameras on the unit and bathroom.  Us  obtained and sent to lab.

## 2023-09-27 NOTE — BH Assessment (Signed)
 Iris consult requested.

## 2023-09-27 NOTE — ED Notes (Signed)
ivc/psych consult ordered/pending.[name]

## 2023-09-27 NOTE — BH Assessment (Signed)
 Writer attempted to contact pt's legal guardian Sephira, [place] (Mother) [phone] to notify guardian of pt's arrival. A HIPAA compliant voicemail was left requesting a call back.

## 2023-09-27 NOTE — ED Notes (Signed)
moved to bhu.. 

## 2023-09-27 NOTE — ED Notes (Signed)
 ED RN called and gave report to Amy, RN. Awaiting security presence for transport to [facility] room.

## 2023-09-28 LAB — URINE CULTURE: Culture: <10000 — AB

## 2023-09-29 ENCOUNTER — Other Ambulatory Visit: Payer: Self-pay

## 2023-09-29 ENCOUNTER — Emergency Department
Admission: EM | Admit: 2023-09-29 | Discharge: 2023-09-30 | Disposition: A | Discharge status: Home / Self Care | Attending: Emergency Medicine | Admitting: Emergency Medicine

## 2023-09-29 DIAGNOSIS — F79 Unspecified intellectual disabilities: Secondary | ICD-10-CM | POA: Insufficient documentation

## 2023-09-29 DIAGNOSIS — F259 Schizoaffective disorder, unspecified: Secondary | ICD-10-CM

## 2023-09-29 DIAGNOSIS — E119 Type 2 diabetes mellitus without complications: Secondary | ICD-10-CM | POA: Diagnosis not present

## 2023-09-29 DIAGNOSIS — I10 Essential (primary) hypertension: Secondary | ICD-10-CM | POA: Diagnosis not present

## 2023-09-29 DIAGNOSIS — E039 Hypothyroidism, unspecified: Secondary | ICD-10-CM | POA: Insufficient documentation

## 2023-09-29 DIAGNOSIS — R45851 Suicidal ideations: Secondary | ICD-10-CM | POA: Diagnosis not present

## 2023-09-29 DIAGNOSIS — F639 Impulse disorder, unspecified: Secondary | ICD-10-CM | POA: Insufficient documentation

## 2023-09-29 DIAGNOSIS — F913 Oppositional defiant disorder: Secondary | ICD-10-CM | POA: Insufficient documentation

## 2023-09-29 DIAGNOSIS — F39 Unspecified mood [affective] disorder: Secondary | ICD-10-CM | POA: Diagnosis not present

## 2023-09-29 DIAGNOSIS — F251 Schizoaffective disorder, depressive type: Secondary | ICD-10-CM | POA: Insufficient documentation

## 2023-09-29 DIAGNOSIS — Z79899 Other long term (current) drug therapy: Secondary | ICD-10-CM | POA: Diagnosis not present

## 2023-09-29 LAB — COMPREHENSIVE METABOLIC PANEL WITH GFR
ALT: 22 U/L (ref 0–44)
AST: 22 U/L (ref 15–41)
Albumin: 3.9 g/dL (ref 3.5–5.0)
Alkaline Phosphatase: 74 U/L (ref 38–126)
Anion gap: 9 (ref 5–15)
BUN: 13 mg/dL (ref 6–20)
CO2: 23 mmol/L (ref 22–32)
Calcium: 9.3 mg/dL (ref 8.9–10.3)
Chloride: 102 mmol/L (ref 98–111)
Creatinine, Ser: 0.96 mg/dL (ref 0.44–1.00)
GFR, Estimated: >60 mL/min (ref >60)
Glucose, Bld: 230 mg/dL — ABNORMAL HIGH (ref 70–99)
Potassium: 3.6 mmol/L (ref 3.5–5.1)
Sodium: 134 mmol/L — ABNORMAL LOW (ref 135–145)
Total Bilirubin: 0.5 mg/dL (ref 0.0–1.2)
Total Protein: 7.6 g/dL (ref 6.5–8.1)

## 2023-09-29 LAB — CBC
HCT: 33.1 % — ABNORMAL LOW (ref 36.0–46.0)
Hemoglobin: 10.8 g/dL — ABNORMAL LOW (ref 12.0–15.0)
MCH: 30.5 pg (ref 26.0–34.0)
MCHC: 32.6 g/dL (ref 30.0–36.0)
MCV: 93.5 fL (ref 80.0–100.0)
Platelets: 256 10*3/uL (ref 150–400)
RBC: 3.54 MIL/uL — ABNORMAL LOW (ref 3.87–5.11)
RDW: 12.6 % (ref 11.5–15.5)
WBC: 12.5 10*3/uL — ABNORMAL HIGH (ref 4.0–10.5)
nRBC: 0 % (ref 0.0–0.2)

## 2023-09-29 LAB — URINE DRUG SCREEN, QUALITATIVE (ARMC ONLY)
Amphetamines, Ur Screen: NOT DETECTED
Barbiturates, Ur Screen: NOT DETECTED
Benzodiazepine, Ur Scrn: NOT DETECTED
Cannabinoid 50 Ng, Ur ~~LOC~~: NOT DETECTED
Cocaine Metabolite,Ur ~~LOC~~: NOT DETECTED
MDMA (Ecstasy)Ur Screen: NOT DETECTED
Methadone Scn, Ur: NOT DETECTED
Opiate, Ur Screen: NOT DETECTED
Phencyclidine (PCP) Ur S: NOT DETECTED
Tricyclic, Ur Screen: POSITIVE — AB

## 2023-09-29 LAB — ETHANOL: Alcohol, Ethyl (B): <15 mg/dL (ref <15)

## 2023-09-29 LAB — POC URINE PREG, ED: Preg Test, Ur: NEGATIVE
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
## 2023-09-29 NOTE — ED Notes (Signed)
 Yellow ring Gray ring Black ring Blue dress Necklace Watch Pink slippers Pink underwear Purple bra

## 2023-09-29 NOTE — ED Notes (Signed)
 Patient's legal guardian, Marlenne Ridge, notified that patient was in the ED and awaiting evaluation.
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
## 2023-09-30 DIAGNOSIS — F79 Unspecified intellectual disabilities: Secondary | ICD-10-CM

## 2023-09-30 DIAGNOSIS — F639 Impulse disorder, unspecified: Secondary | ICD-10-CM

## 2023-09-30 NOTE — ED Notes (Signed)
 Notified MD Quale, Patient stating "If I go back to the group home I feel like I will cut my wrist with glass."

## 2023-09-30 NOTE — ED Notes (Addendum)
 Called Group home to notify them of patient returning home. Employee stated that they were doing shift change at the group home and they would be here to pick her up within the next couple hours of the morning.

## 2023-09-30 NOTE — BH Assessment (Signed)
 This Clinical research associate contacted [facility] via phone to request an assessment for this patient, request has been made, assessment is currently pending.

## 2023-09-30 NOTE — ED Notes (Addendum)
 Called Legal Guardian to notify that patient would be transferring back to group home.  Left Message.
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
## 2023-09-30 NOTE — ED Notes (Signed)
 Legal Guardian called RN back. RN was successful at giving report to [facility].
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
## 2023-09-30 NOTE — ED Notes (Signed)
 Patient belongings and patient AVS discharge given to Group Home

## 2023-09-30 NOTE — ED Notes (Signed)
 Patient Stated "If I go back to the group home, I have heard that if you hit this area of your arm it can be very dangerous."

## 2023-09-30 NOTE — ED Notes (Signed)
 Patient upset at thought of going back to the group home.  Patient states, "I'm going to pee on myself".  Instructed patient that she should not do that and that the bathroom was available for her.

## 2023-09-30 NOTE — ED Notes (Signed)
 Patient stated she was excited about going back to her group home.

## 2023-09-30 NOTE — ED Notes (Signed)
 Called group home back.

## 2023-09-30 NOTE — Discharge Instructions (Addendum)
 Take your medicines daily as directed by your doctor.  Return to the ER for worsening symptoms or other concerns.

## 2023-09-30 NOTE — ED Notes (Signed)
TTS speaking with patient. 

## 2023-09-30 NOTE — ED Notes (Signed)
 Belongings given to caregiver from group home.

## 2023-09-30 NOTE — ED Notes (Signed)
 Patient provided with meal tray.

## 2023-10-15 ENCOUNTER — Other Ambulatory Visit: Payer: Self-pay

## 2023-10-15 ENCOUNTER — Emergency Department
Admission: EM | Admit: 2023-10-15 | Discharge: 2023-10-16 | Disposition: A | Discharge status: Home / Self Care | Attending: Emergency Medicine | Admitting: Emergency Medicine

## 2023-10-15 DIAGNOSIS — R45850 Homicidal ideations: Secondary | ICD-10-CM | POA: Diagnosis not present

## 2023-10-15 DIAGNOSIS — F251 Schizoaffective disorder, depressive type: Secondary | ICD-10-CM | POA: Diagnosis not present

## 2023-10-15 DIAGNOSIS — E119 Type 2 diabetes mellitus without complications: Secondary | ICD-10-CM | POA: Insufficient documentation

## 2023-10-15 DIAGNOSIS — R45851 Suicidal ideations: Secondary | ICD-10-CM | POA: Insufficient documentation

## 2023-10-15 DIAGNOSIS — F79 Unspecified intellectual disabilities: Secondary | ICD-10-CM | POA: Insufficient documentation

## 2023-10-15 DIAGNOSIS — F32A Depression, unspecified: Secondary | ICD-10-CM

## 2023-10-15 DIAGNOSIS — I10 Essential (primary) hypertension: Secondary | ICD-10-CM | POA: Insufficient documentation

## 2023-10-15 DIAGNOSIS — E039 Hypothyroidism, unspecified: Secondary | ICD-10-CM | POA: Insufficient documentation

## 2023-10-15 LAB — ETHANOL: Alcohol, Ethyl (B): <15 mg/dL (ref <15)

## 2023-10-15 LAB — CBC
HCT: 32.1 % — ABNORMAL LOW (ref 36.0–46.0)
Hemoglobin: 10.4 g/dL — ABNORMAL LOW (ref 12.0–15.0)
MCH: 30.1 pg (ref 26.0–34.0)
MCHC: 32.4 g/dL (ref 30.0–36.0)
MCV: 92.8 fL (ref 80.0–100.0)
Platelets: 245 10*3/uL (ref 150–400)
RBC: 3.46 MIL/uL — ABNORMAL LOW (ref 3.87–5.11)
RDW: 13 % (ref 11.5–15.5)
WBC: 10.8 10*3/uL — ABNORMAL HIGH (ref 4.0–10.5)
nRBC: 0 % (ref 0.0–0.2)

## 2023-10-15 LAB — COMPREHENSIVE METABOLIC PANEL WITH GFR
ALT: 34 U/L (ref 0–44)
AST: 34 U/L (ref 15–41)
Albumin: 4 g/dL (ref 3.5–5.0)
Alkaline Phosphatase: 69 U/L (ref 38–126)
Anion gap: 11 (ref 5–15)
BUN: 10 mg/dL (ref 6–20)
CO2: 24 mmol/L (ref 22–32)
Calcium: 9.7 mg/dL (ref 8.9–10.3)
Chloride: 102 mmol/L (ref 98–111)
Creatinine, Ser: 1 mg/dL (ref 0.44–1.00)
GFR, Estimated: >60 mL/min (ref >60)
Glucose, Bld: 160 mg/dL — ABNORMAL HIGH (ref 70–99)
Potassium: 3.6 mmol/L (ref 3.5–5.1)
Sodium: 137 mmol/L (ref 135–145)
Total Bilirubin: 0.5 mg/dL (ref 0.0–1.2)
Total Protein: 8 g/dL (ref 6.5–8.1)

## 2023-10-15 NOTE — ED Triage Notes (Signed)
 Pt to ED via POV c/o SI/HI. Pt reports she wants the group home to shut down, doesn't like how they treat her. Has a plan to bang arm on window and bleed to death or lay out in road and have someone run her over. Denies hallucinations

## 2023-10-15 NOTE — ED Notes (Signed)
 Snack given to this pt at this time. Pt calm, cooperative, and appreciative.

## 2023-10-15 NOTE — ED Provider Notes (Signed)
 [facility] Provider Note    Event Date/Time   First MD Initiated Contact with Patient 10/15/23 2023     (approximate)   History   Psychiatric Evaluation   HPI  Dawn Lambert is a 33 y.o. female who is here because she states she will kill herself.  She is upset at her group home and wants to leave.  Because of this she threatens to go out into traffic or slit her wrist.  She says she wants to put a stop to the group home.  She cannot elucidate any particular event that made her particularly upset today.  Per chart review she has been evaluated for SI in the past.     Physical Exam   Triage Vital Signs: ED Triage Vitals [10/15/23 2005]  Encounter Vitals Group     BP (!) 132/100     Girls Systolic BP Percentile      Girls Diastolic BP Percentile      Boys Systolic BP Percentile      Boys Diastolic BP Percentile      Pulse Rate 69     Resp 18     Temp 98.7 F (37.1 C)     Temp Source Oral     SpO2 98 %     Weight 283 lb (128.4 kg)     Height 5' 2 (1.575 m)     Head Circumference      Peak Flow      Pain Score 0     Pain Loc      Pain Education      Exclude from Growth Chart     Most recent vital signs: Vitals:   10/15/23 2005  BP: (!) 132/100  Pulse: 69  Resp: 18  Temp: 98.7 F (37.1 C)  SpO2: 98%   General: Awake, alert, oriented. CV:  Good peripheral perfusion.  Resp:  Normal effort.  Abd:  No distention.   ED Results / Procedures / Treatments   Labs (all labs ordered are listed, but only abnormal results are displayed) Labs Reviewed  CBC - Abnormal; Notable for the following components:      Result Value   WBC 10.8 (*)    RBC 3.46 (*)    Hemoglobin 10.4 (*)    HCT 32.1 (*)    All other components within normal limits  COMPREHENSIVE METABOLIC PANEL WITH GFR  ETHANOL  URINE DRUG SCREEN, QUALITATIVE ([facility] ONLY)  POC URINE PREG, ED     EKG  None   RADIOLOGY None   PROCEDURES:  Critical Care performed:  No    MEDICATIONS ORDERED IN ED: Medications - No data to display   IMPRESSION / MDM / ASSESSMENT AND PLAN / ED COURSE  I reviewed the triage vital signs and the nursing notes.                              Differential diagnosis includes, but is not limited to, malingering, SI, drug induced mood disorder  Patient's presentation is most consistent with acute presentation with potential threat to life or bodily function.   Patient presents to the emergency department today with concerns for SI because she is upset at her group home.  On exam patient is calm. Will have psychiatry evaluate.  The patient has been placed in psychiatric observation due to the need to provide a safe environment for the patient while obtaining psychiatric consultation and  evaluation, as well as ongoing medical and medication management to treat the patient's condition.  The patient has not been placed under full IVC at this time.       FINAL CLINICAL IMPRESSION(S) / ED DIAGNOSES   Final diagnoses:  Suicidal thoughts      Note:  This document was prepared using Dragon voice recognition software and may include unintentional dictation errors.    Marylynn Soho, MD 10/15/23 2142

## 2023-10-16 DIAGNOSIS — F251 Schizoaffective disorder, depressive type: Secondary | ICD-10-CM | POA: Diagnosis not present

## 2023-10-16 MED ORDER — LORAZEPAM 1 MG PO TABS
1.0000 mg | ORAL_TABLET | Freq: Once | ORAL | Status: AC
  Administered 2023-10-16: 1 mg via ORAL
  Filled 2023-10-16: qty 1

## 2023-10-16 NOTE — Consult Note (Signed)
 [facility] Psychiatric Consult Initial  Patient Name: .Dawn Lambert  MRN: [id number]  DOB: 26-Feb-1991  Consult Order details:  Orders (From admission, onward)     Start     Ordered   10/15/23 2132  CONSULT TO CALL ACT TEAM       Ordering Provider: Marylynn Soho, MD  Provider:  (Not yet assigned)  Question:  Reason for Consult?  Answer:  anger   10/15/23 2131   10/15/23 2132  IP CONSULT TO PSYCHIATRY       Ordering Provider: Marylynn Soho, MD  Provider:  (Not yet assigned)  Question Answer Comment  Place call to: psychiatry   Reason for Consult Consult   Diagnosis/Clinical Info for Consult: anger      10/15/23 2131             Mode of Visit: Tele-visit Virtual Statement:TELE PSYCHIATRY ATTESTATION & CONSENT As the provider for this telehealth consult, I attest that I verified the patient's identity using two separate identifiers, introduced myself to the patient, provided my credentials, disclosed my location, and performed this encounter via a HIPAA-compliant, real-time, face-to-face, two-way, interactive audio and video platform and with the full consent and agreement of the patient (or guardian as applicable.) Patient physical location: [facility]. Telehealth provider physical location: home office in state of [place] .   Video start time:   Video end time:      Psychiatry Consult Evaluation  Service Date: October 16, 2023 LOS:  LOS: 0 days  Chief Complaint Wants to leave group home . Primary Psychiatric Diagnoses  IDD 2.  Schizoaffective disorder depressive  Assessment  Dawn Lambert is a 33 y.o. female admitted: Presented to the EDfor 10/15/2023  8:16 PM for recurrent verbal threats of self-harm and harm to others related to dissatisfaction with her group home placement. She carries the psychiatric diagnoses of Schizoaffective Disorder, depressive type, and Intellectual Developmental Disorder (IDD) and has a past medical history of  long-term group home residence since age 12.  Her current presentation of threatening to cut her wrist and harm staff if not removed from the group home is most consistent with chronic maladaptive coping behavior in the context of fixed delusional thinking and ongoing dissatisfaction with her living environment. She meets criteria for Schizoaffective Disorder, depressive type based on persistent mood symptoms, fixed irrational beliefs, and chronic interpersonal conflict leading to self-injurious threats.  On initial examination, patient is calm and cooperative, endorses suicidal and homicidal ideation tied to group home dissatisfaction, but denies hallucinations. She is alert, oriented, and displays concrete thinking. Please see plan below for detailed recommendations. Diagnoses:  Active Hospital problems: Active Problems:   * No active hospital problems. *    Plan   ## Medical Decision Making Capacity: Not specifically addressed in this encounter   ## Disposition:-- There are no psychiatric contraindications to discharge at this time  ## Behavioral / Environmental: - No specific recommendations at this time.     ## Safety and Observation Level:  - Based on my clinical evaluation, I estimate the patient to be at low risk of self harm in the current setting. - At this time, we recommend  routine. This decision is based on my review of the chart including patient's history and current presentation, interview of the patient, mental status examination, and consideration of suicide risk including evaluating suicidal ideation, plan, intent, suicidal or self-harm behaviors, risk factors, and protective factors. This judgment is based on our ability to directly  address suicide risk, implement suicide prevention strategies, and develop a safety plan while the patient is in the clinical setting. Please contact our team if there is a concern that risk level has changed.  CSSR Risk Category:C-SSRS RISK  CATEGORY: High Risk  Suicide Risk Assessment: Patient has following modifiable risk factors for suicide: under treated depression , which we are addressing by outpatient resources. Patient has following non-modifiable or demographic risk factors for suicide: psychiatric hospitalization Patient has the following protective factors against suicide: Access to outpatient mental health care  Thank you for this consult request. Recommendations have been communicated to the primary team.  We will psychiatrically cleared at this time.   Grae Cannata, NP       History of Present Illness  Relevant Aspects of Hospital ED Course:  Admitted on 10/15/2023 for recurrent verbal threats of self-harm and harm to others related to dissatisfaction with her group home placement.  Patient Report:  The patient presents with a repeated narrative stating she wants to shut down the group home where she has lived since age 40, claiming she has had this desire since age 43. She states that if the group home is not shut down, she will cut her wrist and hurt staff. The only acceptable alternative, per the patient, is to live with her mother or with DSS. The patient denies auditory or visual hallucinations and states she is compliant with her psychiatric medications.  Psych ROS:  Depression: yes Anxiety:  yes Mania (lifetime and current): no Psychosis: (lifetime and current): no   Review of Systems  Constitutional: Negative.   HENT: Negative.    Eyes: Negative.   Respiratory: Negative.    Cardiovascular: Negative.   Gastrointestinal: Negative.   Genitourinary: Negative.   Musculoskeletal: Negative.   Skin: Negative.   Neurological: Negative.   Psychiatric/Behavioral:  Positive for depression.      Psychiatric and Social History  Psychiatric History:  Information collected from Chart history and patient  Prev Dx/Sx: Schizoaffective Disorder Current Psych Provider: unknown Home Meds (current):  unknown Previous Med Trials: unknown Therapy: none  Prior Psych Hospitalization: yes  Prior Self Harm: no Prior Violence: no  Family Psych History: no Family Hx suicide: no  Social History:  Developmental Hx: unknown Educational Hx: unknown Occupational Hx: unknown Legal Hx: none Living Situation: unknown Spiritual Hx: unknown Access to weapons/lethal means: no   Substance History Alcohol: denies  Tobacco: denies Illicit drugs: no Prescription drug abuse: no Rehab hx: denies  Exam Findings   Temp:  [98.7 F (37.1 C)] 98.7 F (37.1 C) (06/16 2005) Pulse Rate:  [69] 69 (06/16 2005) Resp:  [18] 18 (06/16 2005) BP: (132)/(100) 132/100 (06/16 2005) SpO2:  [98 %] 98 % (06/16 2005) Weight:  [128.4 kg] 128.4 kg (06/16 2005) Blood pressure (!) 132/100, pulse 69, temperature 98.7 F (37.1 C), temperature source Oral, resp. rate 18, height 5' 2 (1.575 m), weight 128.4 kg, SpO2 98%. Body mass index is 51.76 kg/m.  Physical Exam Constitutional:      Appearance: Normal appearance.  HENT:     Head: Normocephalic.     Nose: Nose normal.     Mouth/Throat:     Pharynx: Oropharynx is clear.   Eyes:     Extraocular Movements: Extraocular movements intact.    Cardiovascular:     Pulses: Normal pulses.   Musculoskeletal:        General: Normal range of motion.     Cervical back: Normal range of motion.  Skin:    General: Skin is dry.      Other History   These have been pulled in through the EMR, reviewed, and updated if appropriate.  Family History:  The patient's family history is not on file.  Medical History: Past Medical History:  Diagnosis Date   Anxiety    Arnold-Chiari malformation (HCC)    Atrial septal defect    Complication of anesthesia    when pt was 11, woke up during surgery (muscle biopsy)   Constipation    Contraception management    Dandruff    Dermatomyositis [facility])    age 36   Diabetes mellitus without complication (HCC)    Elevated  blood pressure    Hypertension    Hypothyroidism    Mental retardation    Mood disorder (HCC)    Overweight(278.02)    Pelvic pain    Prediabetes     Surgical History: Past Surgical History:  Procedure Laterality Date   ASD REPAIR     at age 16   UMBILICAL HERNIA REPAIR N/A 09/17/2017   Procedure: HERNIA REPAIR UMBILICAL ADULT;  Surgeon: Marshall Skeeter, MD;  Location: [facility] ORS;  Service: General;  Laterality: N/A;     Medications:  No current facility-administered medications for this encounter.  Current Outpatient Medications:    acetaminophen  (TYLENOL ) 325 MG tablet, Take 650 mg by mouth every 6 (six) hours as needed for mild pain or moderate pain., Disp: , Rfl:    albuterol  (VENTOLIN  HFA) 108 (90 Base) MCG/ACT inhaler, Inhale 2 puffs into the lungs every 4 (four) hours as needed. (Patient not taking: Reported on 09/27/2023), Disp: , Rfl:    bismuth subsalicylate (PEPTO BISMOL) 262 MG/15ML suspension, SMARTSIG:30 Milliliter(s) By Mouth 4 Times Daily PRN, Disp: , Rfl:    carbamazepine  (TEGRETOL ) 200 MG tablet, Take 400 mg by mouth 2 (two) times daily. With food, Disp: , Rfl:    cetirizine (ZYRTEC) 10 MG tablet, Take 10 mg by mouth daily as needed for allergies., Disp: , Rfl:    cholecalciferol  (VITAMIN D ) 1000 units tablet, Take 1,000 Units by mouth daily. , Disp: , Rfl:    clonazePAM  (KLONOPIN ) 1 MG tablet, Take 1 mg by mouth 2 (two) times daily., Disp: , Rfl:    Fluocinolone  Acetonide Scalp 0.01 % OIL, Apply to aa's scalp QD on Monday, Wednesday and Friday x 6 weeks. After 6 wks decrease to QW on Friday. May increase to QD on Monday, Wednesday, and Friday during times of flares., Disp: 118.28 mL, Rfl: 11   fluticasone  (FLONASE ) 50 MCG/ACT nasal spray, Place 1 spray into both nostrils daily as needed for allergies or rhinitis. (Patient not taking: Reported on 09/27/2023), Disp: , Rfl:    gabapentin  (NEURONTIN ) 800 MG tablet, Take 800 mg by mouth 3 (three) times daily., Disp: , Rfl:     Guaifenesin  1200 MG TB12, Take 1,200 mg by mouth daily as needed (Congestion)., Disp: , Rfl:    ibuprofen  (ADVIL ) 600 MG tablet, Take 600 mg by mouth every 6 (six) hours as needed. (Patient not taking: Reported on 09/27/2023), Disp: , Rfl:    INVEGA  TRINZA 819 MG/2.63ML injection, Inject 819 mg into the muscle every 3 (three) months. (Patient not taking: Reported on 09/27/2023), Disp: , Rfl:    levothyroxine  (SYNTHROID , LEVOTHROID) 175 MCG tablet, Take 175 mcg by mouth daily before breakfast., Disp: , Rfl:    lidocaine  (XYLOCAINE ) 2 % solution, Use as directed 15 mLs in the mouth or throat as needed  for mouth pain., Disp: , Rfl:    lisinopril  (ZESTRIL ) 10 MG tablet, Take 10 mg by mouth daily., Disp: , Rfl:    lithium  carbonate 300 MG capsule, Take 300-600 mg by mouth 2 (two) times daily. Take 300 mg by mouth every morning 600 mg At bedtime, Disp: , Rfl:    LORazepam  ER (LOREEV XR ) 2 MG CS24, Take 2 mg by mouth in the morning., Disp: , Rfl:    magnesium  hydroxide (MILK OF MAGNESIA) 400 MG/5ML suspension, Take 30 mLs by mouth at bedtime as needed for mild constipation., Disp: , Rfl:    medroxyPROGESTERone (DEPO-PROVERA) 150 MG/ML injection, Inject into the muscle. (Patient not taking: Reported on 09/27/2023), Disp: , Rfl:    meloxicam  (MOBIC ) 15 MG tablet, Take 1 tablet by mouth daily., Disp: , Rfl:    Multiple Vitamin (THEREMS PO), Take 1 tablet by mouth daily. Take with food, Disp: , Rfl:    nystatin  powder, Apply 1 Application topically 2 (two) times daily as needed (fungal infection). Apply under breast, Disp: , Rfl:    ondansetron  (ZOFRAN -ODT) 8 MG disintegrating tablet, Take 8 mg by mouth every 8 (eight) hours as needed. (Patient not taking: Reported on 09/27/2023), Disp: , Rfl:    propranolol  (INDERAL ) 40 MG tablet, Take 40 mg by mouth 2 (two) times daily., Disp: , Rfl:    QUEtiapine  (SEROQUEL  XR) 400 MG 24 hr tablet, Take 400 mg by mouth at bedtime., Disp: , Rfl:    QUEtiapine  (SEROQUEL ) 50 MG  tablet, Take 50 mg by mouth daily. At 2pm., Disp: , Rfl:    TRULICITY  3 MG/0.5ML SOPN, Inject 3 mg into the skin once a week. Wednesday (Patient not taking: Reported on 09/27/2023), Disp: , Rfl:   Allergies: Allergies  Allergen Reactions   Amoxicillin-Pot Clavulanate Other (See Comments)    Has patient had a PCN reaction causing immediate rash, facial/tongue/throat swelling, SOB or lightheadedness with hypotension: ______  Has patient had a PCN reaction causing severe rash involving mucus membranes or skin necrosis: _______  Has patient had a PCN reaction that required hospitalization: _______  Has patient had a PCN reaction occurring within the last 10 years:_______  If all of the above answers are NO, then may proceed with Cephalosporin use.  Other Reaction(s): Unknown  Other reaction(s): Other (See Comments)  Has patient had a PCN reaction causing immediate rash, facial/tongue/throat swelling, SOB or lightheadedness with hypotension: ______  Has patient had a PCN reaction causing severe rash involving mucus membranes or skin necrosis: _______  Has patient had a PCN reaction that required hospitalization: _______  Has patient had a PCN reaction occurring within the last 10 years:_______  If all of the above answers are NO, then may proceed with Cephalosporin use.    Maysen Bonsignore, NP

## 2023-10-16 NOTE — ED Notes (Signed)
 [facility] here to pick up pt, DC papers reviewed with caregiver.

## 2023-10-16 NOTE — ED Notes (Signed)
 Spoke w ith [facility], [phone], said they would be on the way shortly.

## 2023-10-16 NOTE — ED Notes (Addendum)
 Pt stating I feel suicidal, I feel homicidal over and over. Pt also stating I'm gonna put my arm into the glass and bleed to death. When asked what is making pt feel this way pt responds stating I don't want to go back home, I hate it there. Pt informed would let dr know. NP Penn and LCSW Manning informed.

## 2023-10-16 NOTE — Discharge Instructions (Signed)
Take your medicines daily as directed by your doctor.  Return to the ER for worsening symptoms, feelings of hurting yourself or others, or other concerns. 

## 2023-10-16 NOTE — ED Notes (Signed)
 Pt finished eating breakfast, given belongings bag and walked to bathroom to change.
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
## 2023-10-16 NOTE — ED Notes (Signed)
 Pt much calmer at this time. Dressed out into street clothes and currently sitting on bed waiting for [facility].

## 2023-10-16 NOTE — ED Notes (Signed)
 Pt starting to say I'm gonna wreck Mrs [place] on the way home, I'm gonna hurt Mrs [place] on the way home. This RN asked pt what about the [facility] she doesn't like? Pt responded saying everything, when asked if she has stayed at a [facility] she did like, pt stating she ws at a good place in [place] but her family took her out and put her in this new [facility]. Informed pt she was cleared to go home early this morning and maybe she could speak with family about finding a new GH. Pt stated I'm still gonna hurt myself if I go back there. Pt informed would let the psych team know of her plan.

## 2023-10-16 NOTE — ED Provider Notes (Signed)
-----------------------------------------   1:59 AM on 10/16/2023 -----------------------------------------   Patient evaluated by overnight psychiatry team who deems her psychiatrically stable for discharge back to group home.  Strict return precautions given.  Patient verbalized understanding and agrees with plan of care.   Ellington Greenslade J, MD 10/16/23 [phone]

## 2023-10-16 NOTE — ED Notes (Signed)
Pt provided breakfast at bedside

## 2023-10-16 NOTE — ED Notes (Signed)
 Spoke with pt mother, mother stating as soon as you say no, she hates everybody and starts saying she wants to kill herself. GH number saying vacant at this time. Pt provided a different number for this RN to call, will try that shortly.

## 2023-10-16 NOTE — ED Notes (Addendum)
 Called and spoke with pts mother who is legal guardian Salli Bodin) via telephone. Informed Renee that pt is up for discharge and needed ride back to group home. Renee consents to pt being discharged back to group home but reports transportation will not be available until around 8am this morning. Spoke with Moira Andrews from [employer]. States will not know what time transport will be available until manager arrives at 8am.

## 2023-10-23 ENCOUNTER — Other Ambulatory Visit

## 2023-10-27 ENCOUNTER — Emergency Department
Admission: EM | Admit: 2023-10-27 | Discharge: 2023-10-27 | Disposition: A | Discharge status: Home / Self Care | Attending: Emergency Medicine | Admitting: Emergency Medicine

## 2023-10-27 ENCOUNTER — Emergency Department (EMERGENCY_DEPARTMENT_HOSPITAL)
Admission: EM | Admit: 2023-10-27 | Discharge: 2023-10-28 | Disposition: A | Discharge status: Home / Self Care | Source: Home / Self Care | Attending: Emergency Medicine | Admitting: Emergency Medicine

## 2023-10-27 ENCOUNTER — Other Ambulatory Visit: Payer: Self-pay

## 2023-10-27 DIAGNOSIS — F603 Borderline personality disorder: Secondary | ICD-10-CM

## 2023-10-27 DIAGNOSIS — F639 Impulse disorder, unspecified: Secondary | ICD-10-CM | POA: Diagnosis not present

## 2023-10-27 DIAGNOSIS — K0889 Other specified disorders of teeth and supporting structures: Secondary | ICD-10-CM | POA: Diagnosis not present

## 2023-10-27 DIAGNOSIS — F79 Unspecified intellectual disabilities: Secondary | ICD-10-CM | POA: Diagnosis present

## 2023-10-27 DIAGNOSIS — R45851 Suicidal ideations: Secondary | ICD-10-CM

## 2023-10-27 DIAGNOSIS — F251 Schizoaffective disorder, depressive type: Secondary | ICD-10-CM | POA: Diagnosis present

## 2023-10-27 DIAGNOSIS — E039 Hypothyroidism, unspecified: Secondary | ICD-10-CM | POA: Insufficient documentation

## 2023-10-27 DIAGNOSIS — Z79899 Other long term (current) drug therapy: Secondary | ICD-10-CM | POA: Insufficient documentation

## 2023-10-27 DIAGNOSIS — F259 Schizoaffective disorder, unspecified: Secondary | ICD-10-CM | POA: Diagnosis not present

## 2023-10-27 DIAGNOSIS — I10 Essential (primary) hypertension: Secondary | ICD-10-CM | POA: Diagnosis not present

## 2023-10-27 LAB — COMPREHENSIVE METABOLIC PANEL WITH GFR
ALT: 24 U/L (ref 0–44)
AST: 25 U/L (ref 15–41)
Albumin: 4.1 g/dL (ref 3.5–5.0)
Alkaline Phosphatase: 78 U/L (ref 38–126)
Anion gap: 9 (ref 5–15)
BUN: 12 mg/dL (ref 6–20)
CO2: 23 mmol/L (ref 22–32)
Calcium: 9.5 mg/dL (ref 8.9–10.3)
Chloride: 105 mmol/L (ref 98–111)
Creatinine, Ser: 1 mg/dL (ref 0.44–1.00)
GFR, Estimated: >60 mL/min (ref >60)
Glucose, Bld: 197 mg/dL — ABNORMAL HIGH (ref 70–99)
Potassium: 3.7 mmol/L (ref 3.5–5.1)
Sodium: 137 mmol/L (ref 135–145)
Total Bilirubin: 0.4 mg/dL (ref 0.0–1.2)
Total Protein: 8 g/dL (ref 6.5–8.1)

## 2023-10-27 LAB — CBC
HCT: 36.7 % (ref 36.0–46.0)
Hemoglobin: 10.9 g/dL — ABNORMAL LOW (ref 12.0–15.0)
MCH: 30.4 pg (ref 26.0–34.0)
MCHC: 29.7 g/dL — ABNORMAL LOW (ref 30.0–36.0)
MCV: 102.5 fL — ABNORMAL HIGH (ref 80.0–100.0)
Platelets: 280 10*3/uL (ref 150–400)
RBC: 3.58 MIL/uL — ABNORMAL LOW (ref 3.87–5.11)
RDW: 12.8 % (ref 11.5–15.5)
WBC: 9 10*3/uL (ref 4.0–10.5)
nRBC: 0 % (ref 0.0–0.2)

## 2023-10-27 LAB — ETHANOL: Alcohol, Ethyl (B): <15 mg/dL (ref <15)

## 2023-10-27 MED ORDER — IBUPROFEN 600 MG PO TABS
600.0000 mg | ORAL_TABLET | Freq: Once | ORAL | Status: AC
  Administered 2023-10-27: 600 mg via ORAL
  Filled 2023-10-27: qty 1

## 2023-10-27 NOTE — ED Provider Notes (Signed)
 [facility] Provider Note    Event Date/Time   First MD Initiated Contact with Patient 10/27/23 2300     (approximate)   History   Psychiatric Evaluation   HPI  Dawn Lambert is a 33 y.o. female   Past medical history of multiple medical comorbidities here for the second time today due to being dissatisfied with her group home living situation.  She had an argument at the group home and ran away notes that she was away from the group home, crossing the street, laying down in the middle of the road.  She states that she wanted to get away from her group home.  She does not give me a straight answer when I ask if she had any sort of suicidal or self-harm intent when she ran away or laid down the road, but she does endorse some passive suicidal ideation.  .  She denies any other self-harm attempts or ingestions.  She denies any other acute medical complaints.  External Medical Documents Reviewed: Psychiatry notes from earlier this month      Physical Exam   Triage Vital Signs: ED Triage Vitals  Encounter Vitals Group     BP 10/27/23 2230 (!) 148/93     Girls Systolic BP Percentile --      Girls Diastolic BP Percentile --      Boys Systolic BP Percentile --      Boys Diastolic BP Percentile --      Pulse Rate 10/27/23 2230 79     Resp 10/27/23 2230 18     Temp 10/27/23 2230 98.4 F (36.9 C)     Temp Source 10/27/23 2230 Oral     SpO2 10/27/23 2230 100 %     Weight --      Height 10/27/23 2231 5' 2 (1.575 m)     Head Circumference --      Peak Flow --      Pain Score 10/27/23 2231 0     Pain Loc --      Pain Education --      Exclude from Growth Chart --     Most recent vital signs: Vitals:   10/27/23 2230  BP: (!) 148/93  Pulse: 79  Resp: 18  Temp: 98.4 F (36.9 C)  SpO2: 100%    General: Awake, no distress.  CV:  Good peripheral perfusion.  Resp:  Normal effort.  Abd:  No distention.  Other:  Awake comfortable appearing  laying in the stretcher no acute distress.  No obvious signs of external trauma to the arms, head, soft benign abdominal exam, normal vital signs.  Answering my questions appropriately.   ED Results / Procedures / Treatments   Labs (all labs ordered are listed, but only abnormal results are displayed) Labs Reviewed  POC URINE PREG, ED  POC URINE PREG, ED    PROCEDURES:  Critical Care performed: No  Procedures   MEDICATIONS ORDERED IN ED: Medications - No data to display  IMPRESSION / MDM / ASSESSMENT AND PLAN / ED COURSE  I reviewed the triage vital signs and the nursing notes.                                Patient's presentation is most consistent with acute presentation with potential threat to life or bodily function.  Differential diagnosis includes, but is not limited to, acute decompensated psychiatric illness, suicidal ideation  MDM:    She is here with suicidal ideation and frustration with her group home.  It is unclear whether her running away or laying down the road was behaviors to get away from a situation she she has been dissatisfied with or any self-harm attempt but she does note some passive  suicidal ideation without a plan currently.  She is here voluntarily seeking psychiatric evaluation and so I have reviewed previously drawn labs within normal limits earlier today, and can med clear for psychiatric evaluation.      FINAL CLINICAL IMPRESSION(S) / ED DIAGNOSES   Final diagnoses:  Suicidal ideation     Rx / DC Orders   ED Discharge Orders     None        Note:  This document was prepared using Dragon voice recognition software and may include unintentional dictation errors.    Cyrena Mylar, MD 10/27/23 2330

## 2023-10-27 NOTE — ED Triage Notes (Signed)
 Pt was dropped off by [employer] with no explanation as to why she was here. Pt states she is from [facility] on [address] for dental pain and SI. Pt states I dont like the group home I am staying at. Her teeth have been bothering her for about 3 weeks and has seen a dentist.

## 2023-10-27 NOTE — ED Notes (Signed)
 Pt dressed ou t:  Black dress Flower flip flops Plastic watch Yellow metal ring with red stone

## 2023-10-27 NOTE — ED Notes (Signed)
 Legal guardian contacted about pt's discharge back to group home

## 2023-10-27 NOTE — ED Notes (Signed)
 This RN attempted to call pt's group home with no answer. This RN then called pt's legal guardian Gretta) for background information. Per legal guardian pt wanted to go shopping. The group home told pt to wait until Monday. Pt did not like that response and decided to leave the group home and lay on the street. Legal guardian shared her frustration with this RN will similar behaviors in the past from pt.

## 2023-10-27 NOTE — ED Notes (Signed)
 Patient called group home to come pick her up for d/c

## 2023-10-27 NOTE — ED Notes (Signed)
 This RN and Manuelita, tech dressed pt out. 1 pair of pink flip flops Green and black dress 1 red ring 1 grey and pink watch

## 2023-10-27 NOTE — ED Provider Notes (Signed)
 [facility] Provider Note    Event Date/Time   First MD Initiated Contact with Patient 10/27/23 1726     (approximate)   History   Chief Complaint: Psychiatric Evaluation   HPI  Dawn Lambert is a 33 y.o. female with suicidal ideation related to dissatisfaction with her group only.  States that she does not want to be there anymore and has felt suicidal for the past 2 weeks related to not wanting to be there.  She has been there for years, since she was 6.  Denies any other acute complaints except for dental pain for which she has seen a dentist already.       Physical Exam   Triage Vital Signs: ED Triage Vitals  Encounter Vitals Group     BP 10/27/23 1704 (!) 155/127     Girls Systolic BP Percentile --      Girls Diastolic BP Percentile --      Boys Systolic BP Percentile --      Boys Diastolic BP Percentile --      Pulse Rate 10/27/23 1704 79     Resp 10/27/23 1704 18     Temp 10/27/23 1704 99.1 F (37.3 C)     Temp Source 10/27/23 1704 Oral     SpO2 10/27/23 1704 100 %     Weight 10/27/23 1704 283 lb (128.4 kg)     Height 10/27/23 1704 5' 2 (1.575 m)     Head Circumference --      Peak Flow --      Pain Score 10/27/23 1708 10     Pain Loc --      Pain Education --      Exclude from Growth Chart --     Most recent vital signs: Vitals:   10/27/23 1704 10/27/23 1708  BP: (!) 155/127 (!) 130/119  Pulse: 79 76  Resp: 18 16  Temp: 99.1 F (37.3 C) 99.1 F (37.3 C)  SpO2: 100% 100%    General: Awake, no distress. CV:  Good peripheral perfusion.  Resp:  Normal effort.  Abd:  No distention.  Other:  No wounds.  No intraoral swelling, no fluctuance or purulent drainage.   ED Results / Procedures / Treatments   Labs (all labs ordered are listed, but only abnormal results are displayed) Labs Reviewed  COMPREHENSIVE METABOLIC PANEL WITH GFR - Abnormal; Notable for the following components:      Result Value   Glucose, Bld 197  (*)    All other components within normal limits  CBC - Abnormal; Notable for the following components:   RBC 3.58 (*)    Hemoglobin 10.9 (*)    MCV 102.5 (*)    MCHC 29.7 (*)    All other components within normal limits  ETHANOL  URINE DRUG SCREEN, QUALITATIVE ([facility] ONLY)  POC URINE PREG, ED     EKG    RADIOLOGY    PROCEDURES:  Procedures   MEDICATIONS ORDERED IN ED: Medications  ibuprofen  (ADVIL ) tablet 600 mg (600 mg Oral Given 10/27/23 1804)     IMPRESSION / MDM / ASSESSMENT AND PLAN / ED COURSE  I reviewed the triage vital signs and the nursing notes.  Patient's presentation is most consistent with exacerbation of chronic illness.  Patient presents with reported suicidal ideation related to dissatisfaction with her group home, this is a chronic issue for which she has previously undergone psychiatric evaluation.  No changes today, no specific plan, she  is calm and interactive.  She now wishes to go back to her group home.  Denies HI or hallucinations.  Reports the SI feeling has passed.  No other acute complaints other than dental pain which has been evaluated by dentistry.  She does not have any intraoral mass or swelling or signs of acute infection.  Will give ibuprofen .  Stable for discharge      FINAL CLINICAL IMPRESSION(S) / ED DIAGNOSES   Final diagnoses:  Schizoaffective disorder, depressive type (HCC)  Dental pain   Rx / DC Orders   ED Discharge Orders     None        Note:  This document was prepared using Dragon voice recognition software and may include unintentional dictation errors.   Viviann Pastor, MD 10/27/23 [phone]

## 2023-10-27 NOTE — ED Triage Notes (Addendum)
 Pt to ed from group home via [employer] for laying out in the middle of the room. Pt was just here earlier for same behavior. Pt is caox4, in no acute distress and ambulatory. Pt denies SI in triage. Per pt she states My group home isnt coming to get me unless my legal guardian comes to get me.

## 2023-10-28 ENCOUNTER — Encounter: Payer: Self-pay | Admitting: Psychiatry

## 2023-10-28 DIAGNOSIS — F639 Impulse disorder, unspecified: Secondary | ICD-10-CM

## 2023-10-28 DIAGNOSIS — F603 Borderline personality disorder: Secondary | ICD-10-CM

## 2023-10-28 DIAGNOSIS — F251 Schizoaffective disorder, depressive type: Secondary | ICD-10-CM | POA: Diagnosis not present

## 2023-10-28 DIAGNOSIS — F259 Schizoaffective disorder, unspecified: Secondary | ICD-10-CM | POA: Diagnosis not present

## 2023-10-28 DIAGNOSIS — F79 Unspecified intellectual disabilities: Secondary | ICD-10-CM | POA: Diagnosis not present

## 2023-10-28 LAB — POC URINE PREG, ED: Preg Test, Ur: NEGATIVE

## 2023-10-28 MED ORDER — ACETAMINOPHEN 325 MG PO TABS
650.0000 mg | ORAL_TABLET | Freq: Once | ORAL | Status: AC
  Administered 2023-10-28: 650 mg via ORAL
  Filled 2023-10-28: qty 2

## 2023-10-28 NOTE — ED Notes (Addendum)
 Attempted to call group home at this time group home. Group home worker answered and then once this tech stated they needed to come pick up pt grup home then hung up on the EDT.   Attempted to call back x2 and got no answer. Not able to leave a voicemail.

## 2023-10-28 NOTE — Consult Note (Signed)
 Dawn Lambert  Patient Name: Dawn Lambert MRN: [id number] DOB: 04-27-1991 DATE OF Consult: 10/28/2023  PRIMARY PSYCHIATRIC DIAGNOSES  1.  Schizoaffective D/O, chronic 2.  IDD 3.  BPD 4. Impulse Control D/O   RECOMMENDATIONS  Inpt psych admission recommended:    [] YES       [x]  NO   patient is at her baseline. Patient's presentation is consistent with impulse control disorder, unspecified complicated by intellectual disability. Patient at elevated risk for suicide due to impulsivity, but her risk is mitigated by protective factors including social support, lives in supervised setting, engaged in outpatient mental health treatment. Patient does not currently meet criteria for inpatient psychiatric hospitalization. Patient's behavioral issues and emotional dysregulation would be best treated with limit setting and outpatient behavioral management and are unlikely to be measurably changed by inpatient psychiatric hospitalization and on the contrary might cause further regression.      Medication recommendations:  agree with continuation of home medications, today's presentation is characterized by inflexibility and oppositional behaviors that are more consistent with IDD and BPD related traits rather than a primary mood condition. Patient's  symptoms and oppositional behaviors appear to be situationally driven, particularly in response to boundaries set by staff. These reactions are likely an expression of difficulty adapting to change and managing expectations. Medication changes are unlikely to address these challenges effectively, as they are rooted in behavioral factors associated with IDD, BPD Interventions should focus on behavioral supports to improve coping strategies, increase flexibility, and enhance ability to tolerate boundaries.   Non-Medication recommendations:  May consider locked supervised setting if current group home cannot safely manage patient at this time.      Communication: Treatment team members (and family members if applicable) who were involved in treatment/care discussions and planning, and with whom we spoke or engaged with via secure text/chat, include the following: Jasmine LCAS   Dr Cyrena  I have discussed my assessment and treatment recommendations with the patient.  She has legal guardian in which ED staff has been in contact with today  Follow-Up Telepsychiatry C/L services:            []  We will continue to follow this patient with you.             [x]  Will sign off for now. Please re-consult our service as necessary.  Thank you for involving us  in the care of this patient. If you have any additional questions or concerns, please call [phone] and ask for me or the provider on-call.  TELEPSYCHIATRY ATTESTATION & CONSENT  As the provider for this telehealth consult, I attest that I verified the patient's identity using two separate identifiers, introduced myself to the patient, provided my credentials, disclosed my location, and performed this encounter via a HIPAA-compliant, real-time, face-to-face, two-way, interactive audio and video platform and with the full consent and agreement of the patient (or guardian as applicable.)  Patient physical location: [facility]. Telehealth provider physical location: home office in state of [place]  Video start time: 03:19am (Central Time) Video end time: 03:28 am (Central Time)  IDENTIFYING DATA  Dawn Lambert is a 33 y.o. year-old female for whom a psychiatric consultation has been ordered by the primary provider. The patient was identified using two separate identifiers.  CHIEF COMPLAINT/REASON FOR CONSULT  I was wallering in the grass and road    HISTORY OF PRESENT ILLNESS (HPI)  The patient presented for second time today to ED for arguing with group home  staff and laying in road.  Pt well known to providers, has frequent ED presentations for similar behaviors when she gets upset  with group home staff.  Review of medical records, mother (legal guardian) reported patient upset today as wanted to go shopping and staff informed her she would need to wait until Monday.  This is her 5th ED presentation for behavioral issues in 2 months.   Hx of treatment for  Schizoaffective D/O, depressed type, Mild Intellectual Disability     borderline personality disorder and impulse control disorder.        Today, client states she does not want to be in her group home, she would prefer a locked residential unit; nursing staff report  mom has stated was in locked facilities in past but knows how to manipulate the system to step back down.  Pt is poor historian, questionable reliability, she denied taking her medications at group home but this has not been reported that patient refusing meds, she stated she is hearing voices \ Dicy your life is ended, your life is done, kill yourself.   She has not been noted to be responding to internal stimuli and does not exhibit thought blocking.  Client no recent episodes of hypomania, hyperactivity, erratic/excessive spending, involvement in dangerous activities, self-inflated ego, grandiosity, or promiscuity.  sleeping 4-5hrs/24hrs, appetite a little bit more, concentration pretty much good Reviewed active medication list/reviewed labs. Obtained Collateral information from medical record.  No carbamazepine  level for review this encounter; on 09/26/23 results were 7.7 No lithium  level for review this encounter on 09/26/23 was 0.8   PAST PSYCHIATRIC HISTORY    Previous Psychiatric Hospitalizations: several last 12/2022 Outpt treatment:  unknown at this time Previous psychotropic medication trials: unknown at this time     Suicide attempts/self-injurious behaviors:  laying in road to be hit by car   History of trauma/abuse/neglect/exploitation:  unknown at this time   PAST MEDICAL HISTORY  Past Medical History:  Diagnosis Date   Anxiety     Arnold-Chiari malformation (HCC)    Atrial septal defect    Complication of anesthesia    when pt was 11, woke up during surgery (muscle biopsy)   Constipation    Contraception management    Dandruff    Dermatomyositis (HCC)    age 78   Diabetes mellitus without complication (HCC)    Elevated blood pressure    Hypertension    Hypothyroidism    Mental retardation    Mood disorder (HCC)    Overweight(278.02)    Pelvic pain    Prediabetes      HOME MEDICATIONS  Facility Ordered Medications  Medication   [COMPLETED] ibuprofen  (ADVIL ) tablet 600 mg   [COMPLETED] acetaminophen  (TYLENOL ) tablet 650 mg   PTA Medications  Medication Sig   propranolol  (INDERAL ) 40 MG tablet Take 40 mg by mouth 2 (two) times daily.   cholecalciferol  (VITAMIN D ) 1000 units tablet Take 1,000 Units by mouth daily.    clonazePAM  (KLONOPIN ) 1 MG tablet Take 1 mg by mouth 2 (two) times daily.   lithium  carbonate 300 MG capsule Take 300-600 mg by mouth 2 (two) times daily. Take 300 mg by mouth every morning 600 mg At bedtime   levothyroxine  (SYNTHROID , LEVOTHROID) 175 MCG tablet Take 175 mcg by mouth daily before breakfast.   Multiple Vitamin (THEREMS PO) Take 1 tablet by mouth daily. Take with food   carbamazepine  (TEGRETOL ) 200 MG tablet Take 400 mg by mouth 2 (two) times daily. With food  LORazepam  ER (LOREEV XR ) 2 MG CS24 Take 2 mg by mouth in the morning.   acetaminophen  (TYLENOL ) 325 MG tablet Take 650 mg by mouth every 6 (six) hours as needed for mild pain or moderate pain.   albuterol  (VENTOLIN  HFA) 108 (90 Base) MCG/ACT inhaler Inhale 2 puffs into the lungs every 4 (four) hours as needed. (Patient not taking: Reported on 09/27/2023)   gabapentin  (NEURONTIN ) 800 MG tablet Take 800 mg by mouth 3 (three) times daily.   QUEtiapine  (SEROQUEL  XR) 400 MG 24 hr tablet Take 400 mg by mouth at bedtime.   QUEtiapine  (SEROQUEL ) 50 MG tablet Take 50 mg by mouth daily. At 2pm.   Fluocinolone  Acetonide Scalp 0.01 %  OIL Apply to aa's scalp QD on Monday, Wednesday and Friday x 6 weeks. After 6 wks decrease to QW on Friday. May increase to QD on Monday, Wednesday, and Friday during times of flares.   TRULICITY  3 MG/0.5ML SOPN Inject 3 mg into the skin once a week. Wednesday (Patient not taking: Reported on 09/27/2023)   cetirizine (ZYRTEC) 10 MG tablet Take 10 mg by mouth daily as needed for allergies.   fluticasone  (FLONASE ) 50 MCG/ACT nasal spray Place 1 spray into both nostrils daily as needed for allergies or rhinitis. (Patient not taking: Reported on 09/27/2023)   INVEGA  TRINZA 819 MG/2.63ML injection Inject 819 mg into the muscle every 3 (three) months. (Patient not taking: Reported on 09/27/2023)   Guaifenesin  1200 MG TB12 Take 1,200 mg by mouth daily as needed (Congestion).   nystatin  powder Apply 1 Application topically 2 (two) times daily as needed (fungal infection). Apply under breast   meloxicam  (MOBIC ) 15 MG tablet Take 1 tablet by mouth daily.   ondansetron  (ZOFRAN -ODT) 8 MG disintegrating tablet Take 8 mg by mouth every 8 (eight) hours as needed. (Patient not taking: Reported on 09/27/2023)   magnesium  hydroxide (MILK OF MAGNESIA) 400 MG/5ML suspension Take 30 mLs by mouth at bedtime as needed for mild constipation.   lidocaine  (XYLOCAINE ) 2 % solution Use as directed 15 mLs in the mouth or throat as needed for mouth pain.   ibuprofen  (ADVIL ) 600 MG tablet Take 600 mg by mouth every 6 (six) hours as needed. (Patient not taking: Reported on 09/27/2023)   bismuth subsalicylate (PEPTO BISMOL) 262 MG/15ML suspension SMARTSIG:30 Milliliter(s) By Mouth 4 Times Daily PRN   medroxyPROGESTERone (DEPO-PROVERA) 150 MG/ML injection Inject into the muscle. (Patient not taking: Reported on 09/27/2023)   lisinopril  (ZESTRIL ) 10 MG tablet Take 10 mg by mouth daily.     ALLERGIES  Allergies  Allergen Reactions   Amoxicillin-Pot Clavulanate Other (See Comments)    Has patient had a PCN reaction causing immediate  rash, facial/tongue/throat swelling, SOB or lightheadedness with hypotension: ______  Has patient had a PCN reaction causing severe rash involving mucus membranes or skin necrosis: _______  Has patient had a PCN reaction that required hospitalization: _______  Has patient had a PCN reaction occurring within the last 10 years:_______  If all of the above answers are NO, then may proceed with Cephalosporin use.  Other Reaction(s): Unknown  Other reaction(s): Other (See Comments)  Has patient had a PCN reaction causing immediate rash, facial/tongue/throat swelling, SOB or lightheadedness with hypotension: ______  Has patient had a PCN reaction causing severe rash involving mucus membranes or skin necrosis: _______  Has patient had a PCN reaction that required hospitalization: _______  Has patient had a PCN reaction occurring within the last 10 years:_______  If all  of the above answers are NO, then may proceed with Cephalosporin use.    SOCIAL & SUBSTANCE USE HISTORY  Living Situation: group home; mom is legal guardian      UDS results not available for this encounter BAL <15 Pregnancy test: negative      FAMILY HISTORY   Family Psychiatric History (if known):  unknown at this time   MENTAL STATUS EXAM (MSE)  Mental Status Exam: General Appearance: Disheveled  Orientation:  Full (Time, Place, and Person)  Memory:  Immediate;   Fair Recent;   Fair Remote;   Fair  Concentration:  Concentration: Fair  Recall:  Fair  Attention  Fair  Eye Contact:  Good  Speech:  Clear and Coherent  Language:  Good  Volume:  Normal  Mood: euthymic   Affect:  Constricted  Thought Process:  Goal Directed  Thought Content:  Hallucinations: Auditory  Suicidal Thoughts:  No  Homicidal Thoughts:  No  Judgement:  Impaired  Insight:  Lacking  Psychomotor Activity:  Normal  Akathisia:  Negative  Fund of Knowledge:  Fair    Assets:  Engineer, maintenance Social Support  Cognition:   Impaired,  Mild  ADL's:  Impaired  AIMS (if indicated):       VITALS  Blood pressure (!) 148/93, pulse 79, temperature 98.4 F (36.9 C), temperature source Oral, resp. rate 18, height 5' 2 (1.575 m), SpO2 100%.  LABS  Admission on 10/27/2023  Component Date Value Ref Range Status   Preg Test, Ur 10/28/2023 Negative  Negative Final    PSYCHIATRIC REVIEW OF SYSTEMS (ROS)  Depression:      [x]  Denies all symptoms of depression [] Depressed mood       [] Insomnia/hypersomnia              [] Fatigue        [] Change in appetite     [] Anhedonia                                [] Difficulty concentrating      [] Hopelessness             [] Worthlessness [] Guilt/shame                [] Psychomotor agitation/retardation   Mania:     [] Denies all symptoms of mania [] Elevated mood           [x] Irritability         [] Pressured speech         []  Grandiosity         []  Decreased need for sleep                                                 [] Increased energy          []  Increase in goal directed activity                                       [] Flight of ideas    []  Excessive involvement in high-risk behaviors                   []  Distractibility     Psychosis:     [] Denies all symptoms  of psychosis [] Paranoia         [x]  Auditory Hallucinations          [] Visual hallucinations         [] ELOC        [] IOR                [] Delusions   Suicide:    []  Denies SI/plan/intent [x]  Passive SI         []   Active SI         [] Plan           [] Intent   Homicide:  [x]   Denies HI/plan/intent []  Passive HI         []  Active HI         [] Plan            [] Intent           [] Identified Target    Additional findings:      Musculoskeletal: No abnormal movements observed      Gait & Station: Normal      Pain Screening: Denies      Nutrition & Dental Concerns: none reported  RISK FORMULATION/ASSESSMENT  Is the patient experiencing any suicidal or homicidal ideations:        Explain if yes: reported auditory  hallucinations telling her life is over to kill herself, no plan but always lay in the road  Protective factors considered for safety management:     Access to adequate health care Advice& help seeking Resourcefulness/Survival skills Positive social support: Positive therapeutic relationship Future oriented Suicide Inquiry:  Denies suicidal ideations, intentions, or plans.  No recent self-harm behavior. Talks futuristically.  Risk factors/concerns considered for safety management:  Prior attempt Physical illness/chronic pain Access to lethal means Impulsivity Unmarried  Is there a safety management plan with the patient and treatment team to minimize risk factors and promote protective factors: Yes           Explain: safety obs Is crisis care placement or psychiatric hospitalization recommended: No     Based on my current evaluation and risk assessment, patient is determined at this time to be at:  Moderate risk  Patient exhibits significant risk of accidental death due to recurrent, impulsive behaviors associated with IDD, Borderline Personality Disorder. Despite not expressing active suicidal intent, their pattern of dysregulated affect, poor impulse control, and engagement in high-risk behaviors (e.g., laying in road) presents a clear and present danger to their safety.  *RISK ASSESSMENT Risk assessment is a dynamic process; it is possible that this patient's condition, and risk level, may change. This should be re-evaluated and managed over time as appropriate. Please re-consult psychiatric consult services if additional assistance is needed in terms of risk assessment and management. If your team decides to discharge this patient, please advise the patient how to best access emergency psychiatric services, or to call 911, if their condition worsens or they feel unsafe in any way.  Total time spent in this encounter was 60 minutes with greater than 50% of time spent in counseling  and coordination of care.     Dr. Claris JUDITHANN Ada, PhD, MSN, APRN, PMHNP-BC, [facility]  KANDICE Ada, NP Telepsychiatry Consult Services

## 2023-10-28 NOTE — BH Assessment (Addendum)
 Writer spoke with pt's legal guardian/Mother Pavneet Markwood [phone]. Per mother pt is ABLE to return to the group home. Mother reported that the group home is so good to the pt. Mother reports pt is used to getting her way. Struggles with accepting no. Responds by walking out and going to lay in the street. Mother explained that the pt manipulates with recurring threats of suicide. This time of year of seems to be triggering to pt per mother.

## 2023-10-28 NOTE — ED Notes (Signed)
 Hospital meal provided, pt tolerated w/o complaints.  Waste discarded appropriately.

## 2023-10-28 NOTE — ED Notes (Signed)
 Attempted to call group home. No answer at this time.

## 2023-10-28 NOTE — ED Provider Notes (Signed)
 Emergency Medicine Observation Re-evaluation Note   BP (!) 148/93 (BP Location: Right Arm)   Pulse 79   Temp 98.4 F (36.9 C) (Oral)   Resp 18   Ht 5' 2 (1.575 m)   SpO2 100%   BMI 51.61 kg/m    ED Course / MDM   No reported events during my shift at the time of this note.   Pt is awaiting dispo from consultants   Ginnie Shams MD    Shams Ginnie, MD 10/28/23 [phone]

## 2023-10-28 NOTE — ED Provider Notes (Signed)
-----------------------------------------   8:16 AM on 10/28/2023 ----------------------------------------- Patient has been seen and evaluated by psychiatry.  They believe the patient safe for discharge home from a psychiatric standpoint.  They recommend discharge back to the patient's group home.   Dorothyann Drivers, MD 10/28/23 [phone]

## 2023-10-28 NOTE — ED Notes (Addendum)
 Called group home with no answer. Called [facility] again to do wellness check on group home due to not answering the phone.

## 2023-10-28 NOTE — ED Notes (Signed)
 Called group home with no answer.

## 2023-10-28 NOTE — ED Notes (Signed)
 bpd

## 2023-10-28 NOTE — ED Notes (Signed)
 Group home not answering phone at this time

## 2023-10-28 NOTE — BH Assessment (Signed)
 Comprehensive Clinical Assessment (CCA) Screening, Triage and Referral Note  10/28/2023 Dawn Lambert [id number] Recommendations for Services/Supports/Treatments: Pending Disposition/[facility] Consult.  Dawn Lambert is a 33 year old, English speaking, Caucasian female. Pt presented to [facility] ED voluntarily. Per triage note Pt to ed from group home via [facility]PD for laying out in the middle of the room. Pt was just here earlier for same behavior. Pt is caox4, in no acute distress and ambulatory. Pt denies SI in triage. Per pt she states My group home isnt coming to get me unless my legal guardian comes to get me.  On assessment, Pt stated, "I had an argument with my staff after one of the guys triggered me. She yelled at me saying she was going to call the owner and get my papers in order to move." Pt explained that she feels happy because staff told her she could not return to the group home. Pt admits to laying out in the middle of the road, attempting to get ran over by a car. Pt reported being verbally aggressive to staff and walking up and down the road in the dark. Pt had dangerous insight, explaining that she intends to lay in the street again, if forced to return to her group home. Pt endorsed having symptoms of depression and worsening anxiety. Pt identified her main stressors as living at the group home and having resentments towards all the staff members. Pt continued to endorse SI and HI towards the whole group home. Pt reported that she hears voices that tell her to lay in the road. Pt deniesV/H.  Chief Complaint:  Chief Complaint  Patient presents with   Psychiatric Evaluation   Visit Diagnosis:  IDD 2.  Schizoaffective disorder depressive    Patient Reported Information How did you hear about us ? Other (Comment) Mudlogger)  What Is the Reason for Your Visit/Call Today? Pt to ed from group home via [employer] for laying out in the middle of the room. Pt was just here earlier for same  behavior. Pt is caox4, in no acute distress and ambulatory. Pt denies SI in triage. Per pt she states My group home isnt coming to get me unless my legal guardian comes to get me.  How Long Has This Been Causing You Problems? > than 6 months  What Do You Feel Would Help You the Most Today? Housing Assistance   Have You Recently Had Any Thoughts About Hurting Yourself? Yes  Are You Planning to Commit Suicide/Harm Yourself At This time? No   Have you Recently Had Thoughts About Hurting Someone Sherral? No  Are You Planning to Harm Someone at This Time? No  Explanation: Pt continued to endorse SI if she's made to return to her group home.   Have You Used Any Alcohol or Drugs in the Past 24 Hours? No  How Long Ago Did You Use Drugs or Alcohol? No data recorded What Did You Use and How Much? No data recorded  Do You Currently Have a Therapist/Psychiatrist? Yes  Name of Therapist/Psychiatrist: Pt unable to recall name of her therapist.   Have You Been Recently Discharged From Any Office Practice or Programs? No  Explanation of Discharge From Practice/Program: No data recorded   CCA Screening Triage Referral Assessment Type of Contact: Face-to-Face  Telemedicine Service Delivery:   Is this Initial or Reassessment?   Date Telepsych consult ordered in [facility]:    Time Telepsych consult ordered in [facility]:    Location of Assessment: [facility] ED  Provider  Location: [facility] ED    Collateral Involvement: None provided   Does Patient Have a Court Appointed Legal Guardian? No data recorded Name and Contact of Legal Guardian: No data recorded If Minor and Not Living with Parent(s), Who has Custody? n/a  Is CPS involved or ever been involved? Never  Is APS involved or ever been involved? Never   Patient Determined To Be At Risk for Harm To Self or Others Based on Review of Patient Reported Information or Presenting Complaint? Yes, for Self-Harm  Method: Plan with intent and identified  person  Availability of Means: No access or NA  Intent: Clearly intends on inflicting harm that could cause death  Notification Required: No need or identified person  Additional Information for Danger to Others Potential: Active psychosis (n/a)  Additional Comments for Danger to Others Potential: n/a  Are There Guns or Other Weapons in Your Home? No  Types of Guns/Weapons: n/a  Are These Weapons Safely Secured?                            No  Who Could Verify You Are Able To Have These Secured: n/a  Do You Have any Outstanding Charges, Pending Court Dates, Parole/Probation? None reported  Contacted To Inform of Risk of Harm To Self or Others: -- (n/a)   Does Patient Present under Involuntary Commitment? No    County of Residence: [place]   Patient Currently Receiving the Following Services: Group Home; Medication Management   Determination of Need: Emergent (2 hours)   Options For Referral: ED Visit   Disposition Recommendation per psychiatric provider: Disposition pending  Alisea Matte R Jermain Curt, LCAS

## 2023-10-28 NOTE — Discharge Instructions (Signed)
You have been seen in the emergency department for a  psychiatric concern. You have been evaluated both medically as well as psychiatrically. Please follow-up with your outpatient resources provided. Return to the emergency department for any worsening symptoms, or any thoughts of hurting yourself or anyone else so that we may attempt to help you. 

## 2023-10-28 NOTE — ED Notes (Addendum)
 Called group home with no answer.Called [facility] to do wellness check on group home due to not answering the phone.

## 2023-11-03 ENCOUNTER — Emergency Department
Admission: EM | Admit: 2023-11-03 | Discharge: 2023-11-03 | Disposition: A | Discharge status: Other Acute Inpatient Hospital | Attending: Emergency Medicine | Admitting: Emergency Medicine

## 2023-11-03 ENCOUNTER — Other Ambulatory Visit: Payer: Self-pay

## 2023-11-03 DIAGNOSIS — Z79899 Other long term (current) drug therapy: Secondary | ICD-10-CM | POA: Diagnosis not present

## 2023-11-03 DIAGNOSIS — F259 Schizoaffective disorder, unspecified: Secondary | ICD-10-CM | POA: Insufficient documentation

## 2023-11-03 DIAGNOSIS — R454 Irritability and anger: Secondary | ICD-10-CM | POA: Insufficient documentation

## 2023-11-03 DIAGNOSIS — E039 Hypothyroidism, unspecified: Secondary | ICD-10-CM | POA: Insufficient documentation

## 2023-11-03 DIAGNOSIS — I10 Essential (primary) hypertension: Secondary | ICD-10-CM | POA: Diagnosis not present

## 2023-11-03 DIAGNOSIS — R45851 Suicidal ideations: Secondary | ICD-10-CM | POA: Insufficient documentation

## 2023-11-03 DIAGNOSIS — F79 Unspecified intellectual disabilities: Secondary | ICD-10-CM | POA: Diagnosis not present

## 2023-11-03 DIAGNOSIS — F639 Impulse disorder, unspecified: Secondary | ICD-10-CM | POA: Diagnosis not present

## 2023-11-03 DIAGNOSIS — E119 Type 2 diabetes mellitus without complications: Secondary | ICD-10-CM | POA: Diagnosis not present

## 2023-11-03 DIAGNOSIS — Z008 Encounter for other general examination: Secondary | ICD-10-CM | POA: Insufficient documentation

## 2023-11-03 LAB — CBC
HCT: 33 % — ABNORMAL LOW (ref 36.0–46.0)
Hemoglobin: 10.9 g/dL — ABNORMAL LOW (ref 12.0–15.0)
MCH: 30.5 pg (ref 26.0–34.0)
MCHC: 33 g/dL (ref 30.0–36.0)
MCV: 92.4 fL (ref 80.0–100.0)
Platelets: 263 K/uL (ref 150–400)
RBC: 3.57 MIL/uL — ABNORMAL LOW (ref 3.87–5.11)
RDW: 13 % (ref 11.5–15.5)
WBC: 8.7 K/uL (ref 4.0–10.5)
nRBC: 0 % (ref 0.0–0.2)

## 2023-11-03 LAB — COMPREHENSIVE METABOLIC PANEL WITH GFR
ALT: 29 U/L (ref 0–44)
AST: 34 U/L (ref 15–41)
Albumin: 3.8 g/dL (ref 3.5–5.0)
Alkaline Phosphatase: 74 U/L (ref 38–126)
Anion gap: 10 (ref 5–15)
BUN: 13 mg/dL (ref 6–20)
CO2: 26 mmol/L (ref 22–32)
Calcium: 9.9 mg/dL (ref 8.9–10.3)
Chloride: 102 mmol/L (ref 98–111)
Creatinine, Ser: 1.03 mg/dL — ABNORMAL HIGH (ref 0.44–1.00)
GFR, Estimated: >60 mL/min (ref >60)
Glucose, Bld: 253 mg/dL — ABNORMAL HIGH (ref 70–99)
Potassium: 3.5 mmol/L (ref 3.5–5.1)
Sodium: 138 mmol/L (ref 135–145)
Total Bilirubin: 0.3 mg/dL (ref 0.0–1.2)
Total Protein: 7.3 g/dL (ref 6.5–8.1)

## 2023-11-03 LAB — ETHANOL: Alcohol, Ethyl (B): <15 mg/dL (ref <15)

## 2023-11-03 LAB — URINE DRUG SCREEN, QUALITATIVE (ARMC ONLY)
Amphetamines, Ur Screen: NOT DETECTED
Barbiturates, Ur Screen: NOT DETECTED
Benzodiazepine, Ur Scrn: POSITIVE — AB
Cannabinoid 50 Ng, Ur ~~LOC~~: NOT DETECTED
Cocaine Metabolite,Ur ~~LOC~~: NOT DETECTED
MDMA (Ecstasy)Ur Screen: NOT DETECTED
Methadone Scn, Ur: NOT DETECTED
Opiate, Ur Screen: NOT DETECTED
Phencyclidine (PCP) Ur S: NOT DETECTED
Tricyclic, Ur Screen: POSITIVE — AB

## 2023-11-03 NOTE — ED Notes (Signed)
 Pt given snack.

## 2023-11-03 NOTE — ED Triage Notes (Signed)
 Pt to ED from [facility] group home with [facility] voluntarily, stating they want me gone, they don' twnt me back. They're not treating me well and I laid down in the road today. Not the sidewalk, the road. Pt is calm and cooperative in triage. Tried to call legal guardian Charlies, no answer.

## 2023-11-03 NOTE — BH Assessment (Signed)
 Tele consult will be completed by [facility].  [facility] Coordinator will communicate in secure chat assessment time and provider name.

## 2023-11-03 NOTE — ED Notes (Signed)
Patient is vol pending consult

## 2023-11-03 NOTE — ED Notes (Signed)
 Spoke with Rollene at group home, she stated that she would get in touch with management to help facilitate D/C pickup and call back.

## 2023-11-03 NOTE — ED Notes (Signed)
 1910 called group home at [phone], no answer and mailbox full 1937 called group home at [phone], no answer and no mailbox availability 1941 spoke with legal guardian, stated that she did not have another number but will try to get in touch with [facility]'

## 2023-11-03 NOTE — ED Notes (Signed)
 Spoke with Raoul Constable at [phone].  Stated that she works at the group home but is not there right now.  She will try to get in touch with someone at the group home and called back.
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
## 2023-11-03 NOTE — Discharge Instructions (Addendum)
You have been seen in the emergency department for a  psychiatric concern. You have been evaluated both medically as well as psychiatrically. Please follow-up with your outpatient resources provided. Return to the emergency department for any worsening symptoms, or any thoughts of hurting yourself or anyone else so that we may attempt to help you. 

## 2023-11-03 NOTE — ED Notes (Signed)
 Pt asking about when she can go back to group home.  When asked if she was ready to go back, she stated yes.

## 2023-11-07 ENCOUNTER — Emergency Department
Admission: EM | Admit: 2023-11-07 | Discharge: 2023-11-08 | Disposition: A | Discharge status: Other Acute Inpatient Hospital | Attending: Emergency Medicine | Admitting: Emergency Medicine

## 2023-11-07 ENCOUNTER — Other Ambulatory Visit: Payer: Self-pay

## 2023-11-07 DIAGNOSIS — F259 Schizoaffective disorder, unspecified: Secondary | ICD-10-CM | POA: Diagnosis present

## 2023-11-07 DIAGNOSIS — Z79899 Other long term (current) drug therapy: Secondary | ICD-10-CM | POA: Insufficient documentation

## 2023-11-07 DIAGNOSIS — I10 Essential (primary) hypertension: Secondary | ICD-10-CM | POA: Insufficient documentation

## 2023-11-07 DIAGNOSIS — F639 Impulse disorder, unspecified: Secondary | ICD-10-CM | POA: Insufficient documentation

## 2023-11-07 DIAGNOSIS — E039 Hypothyroidism, unspecified: Secondary | ICD-10-CM | POA: Diagnosis not present

## 2023-11-07 DIAGNOSIS — E119 Type 2 diabetes mellitus without complications: Secondary | ICD-10-CM | POA: Diagnosis not present

## 2023-11-07 DIAGNOSIS — R45851 Suicidal ideations: Secondary | ICD-10-CM | POA: Diagnosis not present

## 2023-11-07 DIAGNOSIS — F603 Borderline personality disorder: Secondary | ICD-10-CM | POA: Diagnosis not present

## 2023-11-07 DIAGNOSIS — F79 Unspecified intellectual disabilities: Secondary | ICD-10-CM | POA: Insufficient documentation

## 2023-11-07 DIAGNOSIS — F419 Anxiety disorder, unspecified: Secondary | ICD-10-CM | POA: Diagnosis not present

## 2023-11-07 LAB — CBC
HCT: 34.7 % — ABNORMAL LOW (ref 36.0–46.0)
Hemoglobin: 11.1 g/dL — ABNORMAL LOW (ref 12.0–15.0)
MCH: 30.2 pg (ref 26.0–34.0)
MCHC: 32 g/dL (ref 30.0–36.0)
MCV: 94.3 fL (ref 80.0–100.0)
Platelets: 238 K/uL (ref 150–400)
RBC: 3.68 MIL/uL — ABNORMAL LOW (ref 3.87–5.11)
RDW: 12.8 % (ref 11.5–15.5)
WBC: 10.1 K/uL (ref 4.0–10.5)
nRBC: 0 % (ref 0.0–0.2)

## 2023-11-07 LAB — ETHANOL: Alcohol, Ethyl (B): <15 mg/dL (ref <15)

## 2023-11-07 LAB — COMPREHENSIVE METABOLIC PANEL WITH GFR
ALT: 28 U/L (ref 0–44)
AST: 25 U/L (ref 15–41)
Albumin: 3.7 g/dL (ref 3.5–5.0)
Alkaline Phosphatase: 79 U/L (ref 38–126)
Anion gap: 10 (ref 5–15)
BUN: 13 mg/dL (ref 6–20)
CO2: 25 mmol/L (ref 22–32)
Calcium: 9.6 mg/dL (ref 8.9–10.3)
Chloride: 104 mmol/L (ref 98–111)
Creatinine, Ser: 1.07 mg/dL — ABNORMAL HIGH (ref 0.44–1.00)
GFR, Estimated: >60 mL/min (ref >60)
Glucose, Bld: 207 mg/dL — ABNORMAL HIGH (ref 70–99)
Potassium: 3.5 mmol/L (ref 3.5–5.1)
Sodium: 139 mmol/L (ref 135–145)
Total Bilirubin: 0.5 mg/dL (ref 0.0–1.2)
Total Protein: 7.7 g/dL (ref 6.5–8.1)
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
## 2023-11-07 NOTE — ED Notes (Signed)
 This RN called pt's guardian to let her know that pt was in our ED department. Mother stated that she feels like this is not a problem of pt not liking the group home because Mom stated they all get along and like each other most of the time. The Mom said [name] loves the staff.   She doesn't know if [name] needs a medication change or if something is going on physically causing pain, which causes these behavioral outbursts.  Mom explained that pt has never expressed pain the way most people do. She never would tell her mom something hurt, she would just act out physically. Mom said pt is going to have dental work done soon to extract her wisdom teeth. It is possible those are causing the pt discomfort.   Mother also stated it is fine for pt to go back to group home at time of discharge. Mom said she is going to power off her phone tonight so she can rest so will not be able to be reached until morning .
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
## 2023-11-07 NOTE — ED Notes (Signed)
 TTS with pt doing assessment.

## 2023-11-07 NOTE — BH Assessment (Signed)
 Writer called and sent secure chat to [facility] [employer] for psych consult.

## 2023-11-07 NOTE — ED Notes (Signed)
 Vol lum consult pending /moved to [facility] 3

## 2023-11-07 NOTE — ED Notes (Signed)
 Snack given.

## 2023-11-08 DIAGNOSIS — F419 Anxiety disorder, unspecified: Secondary | ICD-10-CM

## 2023-11-08 DIAGNOSIS — F259 Schizoaffective disorder, unspecified: Secondary | ICD-10-CM | POA: Diagnosis not present

## 2023-11-08 DIAGNOSIS — F79 Unspecified intellectual disabilities: Secondary | ICD-10-CM

## 2023-11-08 DIAGNOSIS — R45851 Suicidal ideations: Secondary | ICD-10-CM | POA: Diagnosis not present

## 2023-11-08 DIAGNOSIS — F603 Borderline personality disorder: Secondary | ICD-10-CM

## 2023-11-08 LAB — URINE DRUG SCREEN, QUALITATIVE (ARMC ONLY)
Amphetamines, Ur Screen: NOT DETECTED
Barbiturates, Ur Screen: NOT DETECTED
Benzodiazepine, Ur Scrn: POSITIVE — AB
Cannabinoid 50 Ng, Ur ~~LOC~~: NOT DETECTED
Cocaine Metabolite,Ur ~~LOC~~: NOT DETECTED
MDMA (Ecstasy)Ur Screen: NOT DETECTED
Methadone Scn, Ur: NOT DETECTED
Opiate, Ur Screen: NOT DETECTED
Phencyclidine (PCP) Ur S: NOT DETECTED
Tricyclic, Ur Screen: POSITIVE — AB

## 2023-11-08 MED ORDER — PROPRANOLOL HCL 40 MG PO TABS
40.0000 mg | ORAL_TABLET | Freq: Two times a day (BID) | ORAL | Status: DC
  Administered 2023-11-08: 40 mg via ORAL
  Filled 2023-11-08: qty 1

## 2023-11-08 MED ORDER — CARBAMAZEPINE 200 MG PO TABS
400.0000 mg | ORAL_TABLET | Freq: Two times a day (BID) | ORAL | Status: DC
  Administered 2023-11-08: 400 mg via ORAL
  Filled 2023-11-08: qty 2

## 2023-11-08 MED ORDER — LISINOPRIL 5 MG PO TABS
10.0000 mg | ORAL_TABLET | Freq: Every day | ORAL | Status: DC
  Administered 2023-11-08: 10 mg via ORAL
  Filled 2023-11-08: qty 2

## 2023-11-08 MED ORDER — LITHIUM CARBONATE 300 MG PO CAPS
300.0000 mg | ORAL_CAPSULE | Freq: Once | ORAL | Status: AC
  Administered 2023-11-08: 300 mg via ORAL
  Filled 2023-11-08: qty 1

## 2023-11-08 MED ORDER — GABAPENTIN 400 MG PO CAPS
800.0000 mg | ORAL_CAPSULE | Freq: Three times a day (TID) | ORAL | Status: DC
  Administered 2023-11-08 (×2): 800 mg via ORAL
  Filled 2023-11-08 (×2): qty 2

## 2023-11-08 NOTE — ED Notes (Signed)
 Assumed care of pt. Report received from previous RN. Pt alert and oriented. Pt RR even and unlabored. Pt denies pain at this time. Pt calm and cooperative. Pt denies any needs at this time.

## 2023-11-08 NOTE — ED Notes (Signed)
 Shower supplies,hospital scrubs, grip socks provided to pt

## 2023-11-08 NOTE — ED Notes (Signed)
 [facility] spoke to Dawn Lambert, the owner of [facility] [phone]).  Dawn Lambert confirmed that patient would be picked up by staff today between 2-2:30PM.   Michial Skeen, [facility] [phone]

## 2023-11-08 NOTE — Discharge Instructions (Signed)
Follow up with your primary care physician.     Return to the emergency department for any worsening symptoms.

## 2023-11-08 NOTE — Consult Note (Signed)
 [facility] Consult Note  Patient Name: Dawn Lambert MRN: [id number] DOB: 02-23-1991 DATE OF Consult: 11/08/2023  PRIMARY PSYCHIATRIC DIAGNOSES  - Intellectual disability disorder  - Borderline personality disorder  - Schizoaffective disorder  - Anxiety disorder   RECOMMENDATIONS  - Discharge to group home with ongoing outpatient psychiatric follow-up and supportive therapy.   Medication recommendations:  Continue home medications as prescribed.  Non-Medication/therapeutic recommendations:  Continue supportive care and supervision at the group home. Monitor for changes in behavior or mood.  Communication: Treatment team members (and family members if applicable) who were involved in treatment/care discussions and planning, and with whom we spoke or engaged with via secure text/chat, include the following: patient's treatment team.  Thank you for involving us  in the care of this patient. If you have any additional questions or concerns, please call [phone] and ask for me or the provider on-call.  TELEPSYCHIATRY ATTESTATION & CONSENT  As the provider for this telehealth consult, I attest that I verified the patient's identity using two separate identifiers, introduced myself to the patient, provided my credentials, disclosed my location, and performed this encounter via a HIPAA-compliant, real-time, face-to-face, two-way, interactive audio and video platform and with the full consent and agreement of the patient (or guardian as applicable.)  Patient physical location: [place]. Telehealth provider physical location: home office in state of [place].  Video start time: 0453 (Central Time) Video end time: 0507 (Central Time)  IDENTIFYING DATA  Dawn Lambert is a 33 y.o. year-old female for whom a psychiatric consultation has been ordered by the primary provider. The patient was identified using two separate identifiers.  CHIEF COMPLAINT/REASON FOR CONSULT  - I was lying  outside in the rain and thunder wanting to get hit by lightning because I was unhappy with my group home. - Presented after expressing suicidal ideation and engaging in self-harm behavior due to dissatisfaction with her group home environment.  HISTORY OF PRESENT ILLNESS (HPI)  A 33 year old woman with a history of intellectual disability, borderline personality disorder, anxiety, schizoaffective disorder, and diabetes presented after lying outside in the rain and thunder, expressing a wish to be struck by lightning. She stated that this behavior was motivated by suicidal ideation, specifically a desire to end her life due to dissatisfaction with her group home. She has resided at the group home for fifteen years and indicated that her current unhappiness is a recent development. However, during the evaluation, she reported feeling better and expressed intent to contact the group home in the morning and return.  Chart review revealed a pattern of multiple emergency room visits for similar presentations. Collateral information from her mother by ER nursing staff indicated that, despite periodic complaints, the patient generally gets along well at the group home and has a positive relationship with the staff. The mother also noted that the patient may act out physically when experiencing pain. It was noted that the patient is scheduled for wisdom tooth extraction, which may be contributing to her discomfort, although she did not verbalize any pain during this assessment.  Throughout the evaluation, no evidence of restlessness or response to internal stimuli was observed. No acute psychotic symptoms were apparent. Discharge to the group home with outpatient psychiatric follow-up was recommended.  PAST PSYCHIATRIC HISTORY  - History of intellectual disability disorder, borderline personality disorder, anxiety, and schizoaffective disorder.  - Multiple prior ER visits for similar presentations of suicidal  ideation and self-harm behaviors. - History of inpatient psych admissions. Otherwise as per HPI  above.  PAST MEDICAL HISTORY  Past Medical History:  Diagnosis Date   Anxiety    Arnold-Chiari malformation (HCC)    Atrial septal defect    Complication of anesthesia    when pt was 11, woke up during surgery (muscle biopsy)   Constipation    Contraception management    Dandruff    Dermatomyositis (HCC)    age 33   Diabetes mellitus without complication (HCC)    Elevated blood pressure    Hypertension    Hypothyroidism    Mental retardation    Mood disorder (HCC)    Overweight(278.02)    Pelvic pain    Prediabetes      HOME MEDICATIONS  PTA Medications  Medication Sig   propranolol  (INDERAL ) 40 MG tablet Take 40 mg by mouth 2 (two) times daily.   cholecalciferol  (VITAMIN D ) 1000 units tablet Take 1,000 Units by mouth daily.    clonazePAM  (KLONOPIN ) 1 MG tablet Take 1 mg by mouth 2 (two) times daily.   lithium  carbonate 300 MG capsule Take 300-600 mg by mouth 2 (two) times daily. Take 300 mg by mouth every morning 600 mg At bedtime   levothyroxine  (SYNTHROID , LEVOTHROID) 175 MCG tablet Take 175 mcg by mouth daily before breakfast.   Multiple Vitamin (THEREMS PO) Take 1 tablet by mouth daily. Take with food   carbamazepine  (TEGRETOL ) 200 MG tablet Take 400 mg by mouth 2 (two) times daily. With food   LORazepam  ER (LOREEV XR ) 2 MG CS24 Take 2 mg by mouth in the morning.   acetaminophen  (TYLENOL ) 325 MG tablet Take 650 mg by mouth every 6 (six) hours as needed for mild pain or moderate pain.   albuterol  (VENTOLIN  HFA) 108 (90 Base) MCG/ACT inhaler Inhale 2 puffs into the lungs every 4 (four) hours as needed. (Patient not taking: Reported on 09/27/2023)   gabapentin  (NEURONTIN ) 800 MG tablet Take 800 mg by mouth 3 (three) times daily.   QUEtiapine  (SEROQUEL  XR) 400 MG 24 hr tablet Take 400 mg by mouth at bedtime.   QUEtiapine  (SEROQUEL ) 50 MG tablet Take 50 mg by mouth daily. At 2pm.    Fluocinolone  Acetonide Scalp 0.01 % OIL Apply to aa's scalp QD on Monday, Wednesday and Friday x 6 weeks. After 6 wks decrease to QW on Friday. May increase to QD on Monday, Wednesday, and Friday during times of flares.   TRULICITY  3 MG/0.5ML SOPN Inject 3 mg into the skin once a week. Wednesday (Patient not taking: Reported on 09/27/2023)   cetirizine (ZYRTEC) 10 MG tablet Take 10 mg by mouth daily as needed for allergies.   fluticasone  (FLONASE ) 50 MCG/ACT nasal spray Place 1 spray into both nostrils daily as needed for allergies or rhinitis. (Patient not taking: Reported on 09/27/2023)   INVEGA  TRINZA 819 MG/2.63ML injection Inject 819 mg into the muscle every 3 (three) months. (Patient not taking: Reported on 09/27/2023)   Guaifenesin  1200 MG TB12 Take 1,200 mg by mouth daily as needed (Congestion).   nystatin  powder Apply 1 Application topically 2 (two) times daily as needed (fungal infection). Apply under breast   meloxicam  (MOBIC ) 15 MG tablet Take 1 tablet by mouth daily.   ondansetron  (ZOFRAN -ODT) 8 MG disintegrating tablet Take 8 mg by mouth every 8 (eight) hours as needed. (Patient not taking: Reported on 09/27/2023)   magnesium  hydroxide (MILK OF MAGNESIA) 400 MG/5ML suspension Take 30 mLs by mouth at bedtime as needed for mild constipation.   lidocaine  (XYLOCAINE ) 2 % solution Use  as directed 15 mLs in the mouth or throat as needed for mouth pain.   ibuprofen  (ADVIL ) 600 MG tablet Take 600 mg by mouth every 6 (six) hours as needed. (Patient not taking: Reported on 09/27/2023)   bismuth subsalicylate (PEPTO BISMOL) 262 MG/15ML suspension SMARTSIG:30 Milliliter(s) By Mouth 4 Times Daily PRN   medroxyPROGESTERone (DEPO-PROVERA) 150 MG/ML injection Inject into the muscle. (Patient not taking: Reported on 09/27/2023)   lisinopril  (ZESTRIL ) 10 MG tablet Take 10 mg by mouth daily.     ALLERGIES  Allergies  Allergen Reactions   Amoxicillin-Pot Clavulanate Other (See Comments)    Has patient had a  PCN reaction causing immediate rash, facial/tongue/throat swelling, SOB or lightheadedness with hypotension: ______  Has patient had a PCN reaction causing severe rash involving mucus membranes or skin necrosis: _______  Has patient had a PCN reaction that required hospitalization: _______  Has patient had a PCN reaction occurring within the last 10 years:_______  If all of the above answers are NO, then may proceed with Cephalosporin use.  Other Reaction(s): Unknown  Other reaction(s): Other (See Comments)  Has patient had a PCN reaction causing immediate rash, facial/tongue/throat swelling, SOB or lightheadedness with hypotension: ______  Has patient had a PCN reaction causing severe rash involving mucus membranes or skin necrosis: _______  Has patient had a PCN reaction that required hospitalization: _______  Has patient had a PCN reaction occurring within the last 10 years:_______  If all of the above answers are NO, then may proceed with Cephalosporin use.    SOCIAL & SUBSTANCE USE HISTORY  Social History   Socioeconomic History   Marital status: Single    Spouse name: Not on file   Number of children: Not on file   Years of education: Not on file   Highest education level: Not on file  Occupational History   Not on file  Tobacco Use   Smoking status: Never   Smokeless tobacco: Never  Vaping Use   Vaping status: Never Used  Substance and Sexual Activity   Alcohol use: No   Drug use: No   Sexual activity: Not on file  Other Topics Concern   Not on file  Social History Narrative   Not on file   Social Drivers of Health   Financial Resource Strain: Not on file  Food Insecurity: No Food Insecurity (07/23/2023)   Hunger Vital Sign    Worried About Running Out of Food in the Last Year: Never true    Ran Out of Food in the Last Year: Never true  Transportation Needs: No Transportation Needs (07/23/2023)   PRAPARE - Administrator, Civil Service  (Medical): No    Lack of Transportation (Non-Medical): No  Physical Activity: Not on file  Stress: Not on file  Social Connections: Not on file   Social History   Tobacco Use  Smoking Status Never  Smokeless Tobacco Never   Social History   Substance and Sexual Activity  Alcohol Use No   Social History   Substance and Sexual Activity  Drug Use No    Additional pertinent information .  FAMILY HISTORY  History reviewed. No pertinent family history. Family Psychiatric History (if known):  unknown  MENTAL STATUS EXAM (MSE)  Mental Status Exam: General Appearance: wearing hospital scrubs  Orientation:  Alert and oriented.  Memory:  intact  Concentration:  fair  Recall:  Fair  Attention  Fair  Eye Contact:  Fair  Speech:  Slow  Language:  Volume:  Normal  Mood: I wanted to kill myself because I was unhappy with my group home. She reported feeling better at the time of evaluation.  Affect:  Congruent  Thought Process:  Goal Directed  Thought Content:  Illogical  Suicidal Thoughts:  No  Homicidal Thoughts:  No  Judgement:  Poor  Insight:  Lacking  Psychomotor Activity:  Normal  Akathisia:  No  Fund of Knowledge:  Fair    Assets:  Housing Social Support  Cognition:  intact   ADL's:  Intact  AIMS (if indicated):       VITALS  Blood pressure (!) 122/99, pulse 92, temperature 98.5 F (36.9 C), temperature source Oral, resp. rate 16, height 5' 2 (1.575 m), weight 128 kg, SpO2 99%.  LABS  Admission on 11/07/2023  Component Date Value Ref Range Status   Sodium 11/07/2023 139  135 - 145 mmol/L Final   Potassium 11/07/2023 3.5  3.5 - 5.1 mmol/L Final   Chloride 11/07/2023 104  98 - 111 mmol/L Final   CO2 11/07/2023 25  22 - 32 mmol/L Final   Glucose, Bld 11/07/2023 207 (H)  70 - 99 mg/dL Final   Glucose reference range applies only to samples taken after fasting for at least 8 hours.   BUN 11/07/2023 13  6 - 20 mg/dL Final   Creatinine, Ser 11/07/2023 1.07  (H)  0.44 - 1.00 mg/dL Final   Calcium  11/07/2023 9.6  8.9 - 10.3 mg/dL Final   Total Protein 92/90/7974 7.7  6.5 - 8.1 g/dL Final   Albumin 92/90/7974 3.7  3.5 - 5.0 g/dL Final   AST 92/90/7974 25  15 - 41 U/L Final   ALT 11/07/2023 28  0 - 44 U/L Final   Alkaline Phosphatase 11/07/2023 79  38 - 126 U/L Final   Total Bilirubin 11/07/2023 0.5  0.0 - 1.2 mg/dL Final   GFR, Estimated 11/07/2023 >60  >60 mL/min Final   Comment: (NOTE) Calculated using the CKD-EPI Creatinine Equation (2021)    Anion gap 11/07/2023 10  5 - 15 Final   Performed at [facility], [address] [place], [name] [address]   Alcohol, Ethyl (B) 11/07/2023 <15  <15 mg/dL Final   Comment: (NOTE) For medical purposes only. Performed at [facility], [address] [place], [name] [address]    WBC 11/07/2023 10.1  4.0 - 10.5 K/uL Final   RBC 11/07/2023 3.68 (L)  3.87 - 5.11 MIL/uL Final   Hemoglobin 11/07/2023 11.1 (L)  12.0 - 15.0 g/dL Final   HCT 92/90/7974 34.7 (L)  36.0 - 46.0 % Final   MCV 11/07/2023 94.3  80.0 - 100.0 fL Final   MCH 11/07/2023 30.2  26.0 - 34.0 pg Final   MCHC 11/07/2023 32.0  30.0 - 36.0 g/dL Final   RDW 92/90/7974 12.8  11.5 - 15.5 % Final   Platelets 11/07/2023 238  150 - 400 K/uL Final   nRBC 11/07/2023 0.0  0.0 - 0.2 % Final   Performed at [facility], [address] [place], [name] [address]   Tricyclic, Ur Screen 11/07/2023 POSITIVE (A)  NONE DETECTED Final   Amphetamines, Ur Screen 11/07/2023 NONE DETECTED  NONE DETECTED Final   MDMA (Ecstasy)Ur Screen 11/07/2023 NONE DETECTED  NONE DETECTED Final   Cocaine Metabolite,Ur  11/07/2023 NONE DETECTED  NONE DETECTED Final   Opiate, Ur Screen 11/07/2023 NONE DETECTED  NONE DETECTED Final   Phencyclidine (PCP) Ur S 11/07/2023 NONE DETECTED  NONE  DETECTED Final   Cannabinoid 50 Ng, Ur [place] 11/07/2023 NONE DETECTED  NONE DETECTED Final   Barbiturates, Ur Screen 11/07/2023 NONE DETECTED  NONE DETECTED  Final   Benzodiazepine, Ur Scrn 11/07/2023 POSITIVE (A)  NONE DETECTED Final   Methadone Scn, Ur 11/07/2023 NONE DETECTED  NONE DETECTED Final   Comment: (NOTE) Tricyclics + metabolites, urine    Cutoff 1000 ng/mL Amphetamines + metabolites, urine  Cutoff 1000 ng/mL MDMA (Ecstasy), urine              Cutoff 500 ng/mL Cocaine Metabolite, urine          Cutoff 300 ng/mL Opiate + metabolites, urine        Cutoff 300 ng/mL Phencyclidine (PCP), urine         Cutoff 25 ng/mL Cannabinoid, urine                 Cutoff 50 ng/mL Barbiturates + metabolites, urine  Cutoff 200 ng/mL Benzodiazepine, urine              Cutoff 200 ng/mL Methadone, urine                   Cutoff 300 ng/mL  The urine drug screen provides only a preliminary, unconfirmed analytical test result and should not be used for non-medical purposes. Clinical consideration and professional judgment should be applied to any positive drug screen result due to possible interfering substances. A more specific alternate chemical method must be used in order to obtain a confirmed analytical result. Gas chromatography / mass spectrometry (GC/MS) is the preferred confirm                          atory method. Performed at [facility], [address], [place], [name] [address]     PSYCHIATRIC REVIEW OF SYSTEMS (ROS)  ROS: Notable for the following relevant positive findings: ROS  Additional findings:      Musculoskeletal: No abnormal movements observed      Gait & Station: Laying/Sitting      Pain Screening: Denies      Nutrition & Dental Concerns: none reported  RISK FORMULATION/ASSESSMENT  Is the patient experiencing any suicidal or homicidal ideations: No       Explain if yes: She reported feeling better at the time of evaluation. Protective factors considered for safety management: include a supportive relationship with her mother, a generally positive relationship with group home staff, and willingness to engage  in outpatient psychiatric follow-up.  Risk factors/concerns considered for safety management:  chronic mental health conditions (intellectual disability, borderline personality disorder, anxiety, schizoaffective disorder), chronic medical issue (diabetes), and recent suicidal ideation with a stated wish to die due to dissatisfaction with her group home. Additional risk factors include a history of multiple ER visits for similar presentations and possible physical discomfort related to dental issues.  Is there a safety management plan with the patient and treatment team to minimize risk factors and promote protective factors: Yes           Explain: Continue supportive care and supervision at the group home.  Is crisis care placement or psychiatric hospitalization recommended: No     Based on my current evaluation and risk assessment, patient is determined at this time to be at:  Long-term suicide risk is moderate given her chronic mental health conditions and recurrent presentations. Short-term suicide risk is currently low, as she denied ongoing intent or plan and expressed  willingness to return to her group home and follow up with outpatient care.  *RISK ASSESSMENT Risk assessment is a dynamic process; it is possible that this patient's condition, and risk level, may change. This should be re-evaluated and managed over time as appropriate. Please re-consult psychiatric consult services if additional assistance is needed in terms of risk assessment and management. If your team decides to discharge this patient, please advise the patient how to best access emergency psychiatric services, or to call 911, if their condition worsens or they feel unsafe in any way.   Ines Hock, NP Tele[facility] Consult Services

## 2023-11-08 NOTE — ED Notes (Addendum)
 Pt discharged at this time. RN reviewed discharge instructions with pt. Pt verbalized understanding. Vital signs taken. RR even and unlabored. Pt denies any questions or needs at this time. Pt ambulatory at discharge. Group home transporter awaiting in lobby. Pt escorted to lobby and belongings and discharge instruction given to transporter. Legal guardian notified of transfer.

## 2023-11-08 NOTE — ED Provider Notes (Addendum)
 Emergency Medicine Observation Re-evaluation Note  Dawn Lambert is a 33 y.o. female, seen on rounds today.  Pt initially presented to the ED for complaints of Psychiatric Evaluation  Currently, the patient is is no acute distress. Denies any concerns at this time.  Physical Exam  Blood pressure (!) 122/99, pulse 92, temperature 98.5 F (36.9 C), temperature source Oral, resp. rate 16, height 5' 2 (1.575 m), weight 128 kg, SpO2 99%.  Physical Exam: General: No apparent distress Pulm: Normal WOB Neuro: Moving all extremities Psych: Resting comfortably     ED Course / MDM     I have reviewed the labs performed to date as well as medications administered while in observation.  Recent changes in the last 24 hours include: No acute events overnight.  Plan   Current plan: Patient was evaluated by psychiatry.  Cleared to discharge back to group home.  Patient discharged back to group home, they will come to pick her up Patient is not under full IVC at this time.    Dawn Kirsch, MD 11/08/23 Dawn Lambert    Dawn Kirsch, MD 11/08/23 [phone]

## 2023-11-08 NOTE — ED Notes (Signed)
 Pt Tele-psych consult complete.

## 2023-11-08 NOTE — ED Notes (Signed)
 Pt given snack and drink at this time. Pt is well appearing. Pt denies no other needs at this time.

## 2023-11-08 NOTE — ED Notes (Signed)
 Lunch tray provided to pt.

## 2023-11-08 NOTE — ED Notes (Signed)
 Vol/psych consult still pending

## 2023-11-10 ENCOUNTER — Other Ambulatory Visit: Payer: Self-pay

## 2023-11-10 ENCOUNTER — Emergency Department
Admission: EM | Admit: 2023-11-10 | Discharge: 2023-12-19 | Disposition: A | Discharge status: Other Acute Inpatient Hospital | Attending: Emergency Medicine | Admitting: Emergency Medicine

## 2023-11-10 DIAGNOSIS — E119 Type 2 diabetes mellitus without complications: Secondary | ICD-10-CM | POA: Insufficient documentation

## 2023-11-10 DIAGNOSIS — Z79899 Other long term (current) drug therapy: Secondary | ICD-10-CM | POA: Diagnosis not present

## 2023-11-10 DIAGNOSIS — I10 Essential (primary) hypertension: Secondary | ICD-10-CM | POA: Diagnosis not present

## 2023-11-10 DIAGNOSIS — R1030 Lower abdominal pain, unspecified: Secondary | ICD-10-CM | POA: Diagnosis not present

## 2023-11-10 DIAGNOSIS — E039 Hypothyroidism, unspecified: Secondary | ICD-10-CM | POA: Diagnosis not present

## 2023-11-10 DIAGNOSIS — F419 Anxiety disorder, unspecified: Secondary | ICD-10-CM | POA: Insufficient documentation

## 2023-11-10 DIAGNOSIS — F250 Schizoaffective disorder, bipolar type: Secondary | ICD-10-CM | POA: Diagnosis not present

## 2023-11-10 DIAGNOSIS — R4689 Other symptoms and signs involving appearance and behavior: Secondary | ICD-10-CM | POA: Diagnosis present

## 2023-11-10 DIAGNOSIS — Z79890 Hormone replacement therapy (postmenopausal): Secondary | ICD-10-CM | POA: Insufficient documentation

## 2023-11-10 DIAGNOSIS — F603 Borderline personality disorder: Secondary | ICD-10-CM | POA: Diagnosis not present

## 2023-11-10 DIAGNOSIS — F259 Schizoaffective disorder, unspecified: Secondary | ICD-10-CM | POA: Insufficient documentation

## 2023-11-10 LAB — URINE DRUG SCREEN, QUALITATIVE (ARMC ONLY)
Amphetamines, Ur Screen: NOT DETECTED
Barbiturates, Ur Screen: NOT DETECTED
Benzodiazepine, Ur Scrn: POSITIVE — AB
Cannabinoid 50 Ng, Ur ~~LOC~~: NOT DETECTED
Cocaine Metabolite,Ur ~~LOC~~: NOT DETECTED
MDMA (Ecstasy)Ur Screen: NOT DETECTED
Methadone Scn, Ur: NOT DETECTED
Opiate, Ur Screen: NOT DETECTED
Phencyclidine (PCP) Ur S: NOT DETECTED
Tricyclic, Ur Screen: POSITIVE — AB

## 2023-11-10 LAB — COMPREHENSIVE METABOLIC PANEL WITH GFR
ALT: 27 U/L (ref 0–44)
AST: 32 U/L (ref 15–41)
Albumin: 3.7 g/dL (ref 3.5–5.0)
Alkaline Phosphatase: 72 U/L (ref 38–126)
Anion gap: 11 (ref 5–15)
BUN: 14 mg/dL (ref 6–20)
CO2: 26 mmol/L (ref 22–32)
Calcium: 9.4 mg/dL (ref 8.9–10.3)
Chloride: 104 mmol/L (ref 98–111)
Creatinine, Ser: 1.03 mg/dL — ABNORMAL HIGH (ref 0.44–1.00)
GFR, Estimated: >60 mL/min (ref >60)
Glucose, Bld: 242 mg/dL — ABNORMAL HIGH (ref 70–99)
Potassium: 3.7 mmol/L (ref 3.5–5.1)
Sodium: 141 mmol/L (ref 135–145)
Total Bilirubin: 0.5 mg/dL (ref 0.0–1.2)
Total Protein: 7.2 g/dL (ref 6.5–8.1)

## 2023-11-10 LAB — CBC
HCT: 33.2 % — ABNORMAL LOW (ref 36.0–46.0)
Hemoglobin: 10.7 g/dL — ABNORMAL LOW (ref 12.0–15.0)
MCH: 30.4 pg (ref 26.0–34.0)
MCHC: 32.2 g/dL (ref 30.0–36.0)
MCV: 94.3 fL (ref 80.0–100.0)
Platelets: 240 K/uL (ref 150–400)
RBC: 3.52 MIL/uL — ABNORMAL LOW (ref 3.87–5.11)
RDW: 12.7 % (ref 11.5–15.5)
WBC: 9.6 K/uL (ref 4.0–10.5)
nRBC: 0 % (ref 0.0–0.2)

## 2023-11-10 LAB — ETHANOL: Alcohol, Ethyl (B): <15 mg/dL (ref <15)

## 2023-11-10 LAB — POC URINE PREG, ED: Preg Test, Ur: NEGATIVE

## 2023-11-10 MED ORDER — ALUM & MAG HYDROXIDE-SIMETH 200-200-20 MG/5ML PO SUSP
30.0000 mL | Freq: Four times a day (QID) | ORAL | Status: DC | PRN
  Administered 2023-11-28 – 2023-12-12 (×3): 30 mL via ORAL
  Filled 2023-11-10 (×2): qty 30

## 2023-11-10 MED ORDER — CARBAMAZEPINE 200 MG PO TABS
400.0000 mg | ORAL_TABLET | Freq: Two times a day (BID) | ORAL | Status: DC
  Administered 2023-11-10 – 2023-12-19 (×83): 400 mg via ORAL
  Filled 2023-11-10 (×85): qty 2

## 2023-11-10 MED ORDER — IBUPROFEN 600 MG PO TABS
600.0000 mg | ORAL_TABLET | Freq: Three times a day (TID) | ORAL | Status: DC | PRN
  Administered 2023-11-10 – 2023-12-15 (×35): 600 mg via ORAL
  Filled 2023-11-10 (×34): qty 1

## 2023-11-10 MED ORDER — ONDANSETRON HCL 4 MG PO TABS
4.0000 mg | ORAL_TABLET | Freq: Three times a day (TID) | ORAL | Status: DC | PRN
Start: 2023-11-10 — End: 2023-12-19
  Administered 2023-11-12 – 2023-12-10 (×4): 4 mg via ORAL
  Filled 2023-11-10 (×3): qty 1

## 2023-11-10 MED ORDER — GABAPENTIN 400 MG PO CAPS
800.0000 mg | ORAL_CAPSULE | Freq: Three times a day (TID) | ORAL | Status: DC
  Administered 2023-11-10 – 2023-12-19 (×123): 800 mg via ORAL
  Filled 2023-11-10 (×6): qty 2
  Filled 2023-11-10 (×2): qty 8
  Filled 2023-11-10: qty 2
  Filled 2023-11-10: qty 8
  Filled 2023-11-10 (×3): qty 2
  Filled 2023-11-10: qty 8
  Filled 2023-11-10: qty 2
  Filled 2023-11-10 (×2): qty 8
  Filled 2023-11-10 (×2): qty 2
  Filled 2023-11-10 (×2): qty 8
  Filled 2023-11-10: qty 2
  Filled 2023-11-10 (×2): qty 8
  Filled 2023-11-10 (×12): qty 2
  Filled 2023-11-10 (×2): qty 8
  Filled 2023-11-10 (×5): qty 2
  Filled 2023-11-10: qty 8
  Filled 2023-11-10 (×5): qty 2
  Filled 2023-11-10: qty 8
  Filled 2023-11-10 (×6): qty 2
  Filled 2023-11-10: qty 8
  Filled 2023-11-10 (×6): qty 2
  Filled 2023-11-10 (×2): qty 8
  Filled 2023-11-10 (×3): qty 2
  Filled 2023-11-10: qty 8
  Filled 2023-11-10: qty 2
  Filled 2023-11-10: qty 8
  Filled 2023-11-10 (×2): qty 2
  Filled 2023-11-10: qty 8
  Filled 2023-11-10 (×9): qty 2
  Filled 2023-11-10: qty 8
  Filled 2023-11-10: qty 2
  Filled 2023-11-10 (×2): qty 8
  Filled 2023-11-10 (×6): qty 2
  Filled 2023-11-10: qty 8
  Filled 2023-11-10 (×4): qty 2
  Filled 2023-11-10: qty 8
  Filled 2023-11-10 (×2): qty 2
  Filled 2023-11-10: qty 8
  Filled 2023-11-10 (×4): qty 2
  Filled 2023-11-10: qty 8
  Filled 2023-11-10: qty 2
  Filled 2023-11-10: qty 8
  Filled 2023-11-10 (×2): qty 2
  Filled 2023-11-10: qty 8
  Filled 2023-11-10 (×7): qty 2
  Filled 2023-11-10: qty 8
  Filled 2023-11-10 (×26): qty 2
  Filled 2023-11-10: qty 8
  Filled 2023-11-10: qty 2

## 2023-11-10 MED ORDER — ADULT MULTIVITAMIN W/MINERALS CH
1.0000 | ORAL_TABLET | Freq: Every day | ORAL | Status: DC
  Administered 2023-11-11 – 2023-12-19 (×42): 1 via ORAL
  Filled 2023-11-10 (×39): qty 1

## 2023-11-10 MED ORDER — LISINOPRIL 5 MG PO TABS
10.0000 mg | ORAL_TABLET | Freq: Every day | ORAL | Status: DC
  Administered 2023-11-10 – 2023-12-19 (×43): 10 mg via ORAL
  Filled 2023-11-10 (×7): qty 2
  Filled 2023-11-10: qty 1
  Filled 2023-11-10 (×12): qty 2
  Filled 2023-11-10: qty 1
  Filled 2023-11-10 (×19): qty 2

## 2023-11-10 MED ORDER — CLONAZEPAM 1 MG PO TABS
1.0000 mg | ORAL_TABLET | Freq: Two times a day (BID) | ORAL | Status: DC
  Administered 2023-11-10 – 2023-12-19 (×84): 1 mg via ORAL
  Filled 2023-11-10 (×4): qty 1
  Filled 2023-11-10: qty 2
  Filled 2023-11-10 (×33): qty 1
  Filled 2023-11-10: qty 2
  Filled 2023-11-10 (×39): qty 1

## 2023-11-10 MED ORDER — LITHIUM CARBONATE 300 MG PO CAPS
300.0000 mg | ORAL_CAPSULE | Freq: Every morning | ORAL | Status: DC
  Administered 2023-11-11 – 2023-12-19 (×42): 300 mg via ORAL
  Filled 2023-11-10 (×40): qty 1

## 2023-11-10 MED ORDER — QUETIAPINE FUMARATE ER 200 MG PO TB24
400.0000 mg | ORAL_TABLET | Freq: Every day | ORAL | Status: DC
  Administered 2023-11-11 – 2023-11-13 (×3): 400 mg via ORAL
  Filled 2023-11-10 (×6): qty 2

## 2023-11-10 MED ORDER — LITHIUM CARBONATE 300 MG PO CAPS: 300.0000 mg | ORAL_CAPSULE | Freq: Two times a day (BID) | ORAL | Status: DC

## 2023-11-10 MED ORDER — QUETIAPINE FUMARATE 25 MG PO TABS
50.0000 mg | ORAL_TABLET | Freq: Every day | ORAL | Status: DC
  Administered 2023-11-11 – 2023-11-14 (×4): 50 mg via ORAL
  Filled 2023-11-10 (×4): qty 2

## 2023-11-10 MED ORDER — VITAMIN D 25 MCG (1000 UNIT) PO TABS
1000.0000 [IU] | ORAL_TABLET | Freq: Every day | ORAL | Status: DC
  Administered 2023-11-10 – 2023-12-19 (×43): 1000 [IU] via ORAL
  Filled 2023-11-10 (×40): qty 1

## 2023-11-10 MED ORDER — LEVOTHYROXINE SODIUM 75 MCG PO TABS
175.0000 ug | ORAL_TABLET | Freq: Every day | ORAL | Status: DC
  Administered 2023-11-11 – 2023-12-19 (×42): 175 ug via ORAL
  Filled 2023-11-10 (×31): qty 1
  Filled 2023-11-10: qty 4
  Filled 2023-11-10 (×8): qty 1

## 2023-11-10 MED ORDER — DULAGLUTIDE 4.5 MG/0.5ML ~~LOC~~ SOAJ: 4.5000 mg | SUBCUTANEOUS | Status: DC

## 2023-11-10 MED ORDER — LITHIUM CARBONATE 300 MG PO CAPS
600.0000 mg | ORAL_CAPSULE | Freq: Every day | ORAL | Status: DC
  Administered 2023-11-10 – 2023-12-18 (×42): 600 mg via ORAL
  Filled 2023-11-10 (×40): qty 2

## 2023-11-10 MED ORDER — QUETIAPINE FUMARATE 25 MG PO TABS
50.0000 mg | ORAL_TABLET | Freq: Every day | ORAL | Status: DC
  Administered 2023-11-10: 50 mg via ORAL
  Filled 2023-11-10: qty 2

## 2023-11-10 MED ORDER — LORATADINE 10 MG PO TABS
10.0000 mg | ORAL_TABLET | Freq: Every day | ORAL | Status: DC
  Administered 2023-11-10 – 2023-12-19 (×43): 10 mg via ORAL
  Filled 2023-11-10 (×40): qty 1

## 2023-11-10 MED ORDER — PROPRANOLOL HCL 40 MG PO TABS
40.0000 mg | ORAL_TABLET | Freq: Two times a day (BID) | ORAL | Status: DC
  Administered 2023-11-10 – 2023-12-19 (×83): 40 mg via ORAL
  Filled 2023-11-10 (×38): qty 1
  Filled 2023-11-10: qty 2
  Filled 2023-11-10 (×26): qty 1
  Filled 2023-11-10: qty 2
  Filled 2023-11-10 (×11): qty 1

## 2023-11-10 MED ORDER — HALOPERIDOL 5 MG PO TABS
5.0000 mg | ORAL_TABLET | Freq: Every day | ORAL | Status: DC
  Administered 2023-11-10 – 2023-12-18 (×42): 5 mg via ORAL
  Filled 2023-11-10 (×39): qty 1

## 2023-11-10 NOTE — ED Notes (Signed)
 Pt given blanket and room light turned off.

## 2023-11-10 NOTE — ED Notes (Signed)
 Pt given snack and beverage.
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
## 2023-11-10 NOTE — ED Notes (Signed)
Patient is vol pending consult

## 2023-11-10 NOTE — Consult Note (Signed)
 TTS communicated with [facility], via phone call and secure chat about the patient needing a psych consult. Psych consult scheduled for 7:00 PM.
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
## 2023-11-10 NOTE — ED Notes (Signed)
 Patient speaking to telepsych provider.

## 2023-11-10 NOTE — ED Notes (Signed)
 Per [facility] (Nina Cillo), patient to be referred out of system.  Referral information for Psychiatric Hospitalization faxed to;   [facility] [phone]- [phone])  [facility] [phone]),   [facility] [phone])   [facility] [phone]) [phone]  [place] [phone]),  [place] [phone], [phone], [phone] or [phone]),   [place] [phone]--- [phone]--- [phone])  [address] [phone]),   [place] [phone]- [phone]),   [facility] [phone]),  [place] [phone])  [facility] [phone])
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
## 2023-11-10 NOTE — Consult Note (Signed)
 [facility] Consult Note  Patient Name: Dawn Lambert MRN: [id number] DOB: 12-21-1990 DATE OF Consult: 11/10/2023  PRIMARY PSYCHIATRIC DIAGNOSES  1.  Schizoaffective disorder 2.  Borderline personality disorder 3.  Anxiety  RECOMMENDATIONS  Recommendations:   Medication recommendations: add haloperidol  5 mg po nightly for psychosis/mood; reconcile and continue other meds per OP regimen  Non-Medication/therapeutic recommendations: Psych admission at this time, though if patient maintains safety and/or improves would potentially be able to have alternative dispo   Is inpatient psychiatric hospitalization recommended for this patient? Yes (Explain why): SI, HI, CAH   Follow-Up Telepsychiatry C/L services: We will continue to follow this patient with you until stabilized or discharged.  If you have any questions or concerns, please call our TeleCare Coordination service at  [phone] and ask for myself or the provider on-call.  Communication: Treatment team members (and family members if applicable) who were involved in treatment/care discussions and planning, and with whom we spoke or engaged with via secure text/chat, include the following: EDRN  Thank you for involving us  in the care of this patient. If you have any additional questions or concerns, please call [phone] and ask for me or the provider on-call.  TELEPSYCHIATRY ATTESTATION & CONSENT  As the provider for this telehealth consult, I attest that I verified the patient's identity using two separate identifiers, introduced myself to the patient, provided my credentials, disclosed my location, and performed this encounter via a HIPAA-compliant, real-time, face-to-face, two-way, interactive audio and video platform and with the full consent and agreement of the patient (or guardian as applicable.)  Patient physical location: ED at [facility]. Telehealth provider physical location: home office in state of  [place] .  Video start time: 600p CST (Central Time) Video end time: 618p CST (Central Time)  IDENTIFYING DATA  Dawn Lambert is a 33 y.o. year-old female for whom a psychiatric consultation has been ordered by the primary provider. The patient was identified using two separate identifiers.  CHIEF COMPLAINT/REASON FOR CONSULT   Suicidal ideation, schizoaffective disorder   HISTORY OF PRESENT ILLNESS (HPI)  The patient is a 33 year old woman with history of schizoaffective disorder, borderline personality disorder, schizoaffective disorder, anxiety; presenting to ED from group home with c/o suicidal ideation, homicidal ideation, hallucinations. She was seen in ED with recent complaints 7/10, though at that time she reported feeling better and voiced willingness to continue with OP treatment.  The patient today, however, is more adamant regarding their endorsement of active suicidal ideation. They endorse homicidal ideation towards the group home staff. Reports plan to cut her wrists and states no one could stop her if she had access at this time. Reports command auditory hallucinations to harm others and herself. She requests haloperidol  and states this has been helpful for her; she queries re: a PRN; given her symptoms, I discussed scheduling it (consider 5 mg po haloperidol  nightly). She does take a LAI in chart though unclear when last received; she reports feeling this way for a few days. I did discuss a possibility of treating with haloperidol  and reassessing, but patient is adamant she needs the hospital.  At present, given her endorsement of active SI with plan, HI with target, CAH, I do recommend Psychiatric admission.  PAST PSYCHIATRIC HISTORY  Multiple ER presentations with similar complaints, most recent 7/10 though patient had improved and wished to continue in OP treatment History of self-harm, suicidal behavior Prior med trials: Yes  Otherwise as per HPI above.  PAST MEDICAL  HISTORY  Past Medical History:  Diagnosis Date   Anxiety    Arnold-Chiari malformation (HCC)    Atrial septal defect    Complication of anesthesia    when pt was 33, woke up during surgery (muscle biopsy)   Constipation    Contraception management    Dandruff    Dermatomyositis (HCC)    age 62   Diabetes mellitus without complication (HCC)    Elevated blood pressure    Hypertension    Hypothyroidism    Mental retardation    Mood disorder (HCC)    Overweight(278.02)    Pelvic pain    Prediabetes      HOME MEDICATIONS  Facility Ordered Medications  Medication   ibuprofen  (ADVIL ) tablet 600 mg   ondansetron  (ZOFRAN ) tablet 4 mg   alum & mag hydroxide-simeth (MAALOX/MYLANTA) 200-200-20 MG/5ML suspension 30 mL   PTA Medications  Medication Sig   propranolol  (INDERAL ) 40 MG tablet Take 40 mg by mouth 2 (two) times daily.   cholecalciferol  (VITAMIN D ) 1000 units tablet Take 1,000 Units by mouth daily.    clonazePAM  (KLONOPIN ) 1 MG tablet Take 1 mg by mouth 2 (two) times daily.   lithium  carbonate 300 MG capsule Take 300-600 mg by mouth 2 (two) times daily. Take 300 mg by mouth every morning 600 mg At bedtime   levothyroxine  (SYNTHROID , LEVOTHROID) 175 MCG tablet Take 175 mcg by mouth daily before breakfast.   Multiple Vitamin (THEREMS PO) Take 1 tablet by mouth daily. Take with food   carbamazepine  (TEGRETOL ) 200 MG tablet Take 400 mg by mouth 2 (two) times daily. With food   LORazepam  ER (LOREEV XR ) 2 MG CS24 Take 2 mg by mouth in the morning.   acetaminophen  (TYLENOL ) 325 MG tablet Take 650 mg by mouth every 6 (six) hours as needed for mild pain or moderate pain.   albuterol  (VENTOLIN  HFA) 108 (90 Base) MCG/ACT inhaler Inhale 2 puffs into the lungs every 4 (four) hours as needed.   gabapentin  (NEURONTIN ) 800 MG tablet Take 800 mg by mouth 3 (three) times daily.   QUEtiapine  (SEROQUEL  XR) 400 MG 24 hr tablet Take 400 mg by mouth at bedtime.   QUEtiapine  (SEROQUEL ) 50 MG tablet  Take 50 mg by mouth daily. At 2pm.   Fluocinolone  Acetonide Scalp 0.01 % OIL Apply to aa's scalp QD on Monday, Wednesday and Friday x 6 weeks. After 6 wks decrease to QW on Friday. May increase to QD on Monday, Wednesday, and Friday during times of flares.   cetirizine (ZYRTEC) 10 MG tablet Take 10 mg by mouth daily as needed for allergies.   fluticasone  (FLONASE ) 50 MCG/ACT nasal spray Place 1 spray into both nostrils daily as needed for allergies or rhinitis.   INVEGA  TRINZA 819 MG/2.63ML injection Inject 819 mg into the muscle every 3 (three) months.   Guaifenesin  1200 MG TB12 Take 1,200 mg by mouth daily as needed (Congestion).   nystatin  powder Apply 1 Application topically 2 (two) times daily as needed (fungal infection). Apply under breast   meloxicam  (MOBIC ) 15 MG tablet Take 1 tablet by mouth daily.   ondansetron  (ZOFRAN -ODT) 8 MG disintegrating tablet Take 8 mg by mouth every 8 (eight) hours as needed. (Patient not taking: Reported on 09/27/2023)   magnesium  hydroxide (MILK OF MAGNESIA) 400 MG/5ML suspension Take 30 mLs by mouth at bedtime as needed for mild constipation.   lidocaine  (XYLOCAINE ) 2 % solution Use as directed 15 mLs in the mouth or throat as needed for mouth  pain.   ibuprofen  (ADVIL ) 600 MG tablet Take 600 mg by mouth every 6 (six) hours as needed.   bismuth subsalicylate (PEPTO BISMOL) 262 MG/15ML suspension SMARTSIG:30 Milliliter(s) By Mouth 4 Times Daily PRN   medroxyPROGESTERone (DEPO-PROVERA) 150 MG/ML injection Inject into the muscle. (Patient not taking: Reported on 09/27/2023)   lisinopril  (ZESTRIL ) 10 MG tablet Take 10 mg by mouth daily.   TRULICITY  4.5 MG/0.5ML SOAJ Inject 4.5 mg into the skin once a week. Wednesday     ALLERGIES  Allergies  Allergen Reactions   Amoxicillin-Pot Clavulanate Other (See Comments)    Has patient had a PCN reaction causing immediate rash, facial/tongue/throat swelling, SOB or lightheadedness with hypotension: ______  Has patient  had a PCN reaction causing severe rash involving mucus membranes or skin necrosis: _______  Has patient had a PCN reaction that required hospitalization: _______  Has patient had a PCN reaction occurring within the last 10 years:_______  If all of the above answers are NO, then may proceed with Cephalosporin use.  Other Reaction(s): Unknown  Other reaction(s): Other (See Comments)  Has patient had a PCN reaction causing immediate rash, facial/tongue/throat swelling, SOB or lightheadedness with hypotension: ______  Has patient had a PCN reaction causing severe rash involving mucus membranes or skin necrosis: _______  Has patient had a PCN reaction that required hospitalization: _______  Has patient had a PCN reaction occurring within the last 10 years:_______  If all of the above answers are NO, then may proceed with Cephalosporin use.    SOCIAL & SUBSTANCE USE HISTORY  Social History   Socioeconomic History   Marital status: Single    Spouse name: Not on file   Number of children: Not on file   Years of education: Not on file   Highest education level: Not on file  Occupational History   Not on file  Tobacco Use   Smoking status: Never   Smokeless tobacco: Never  Vaping Use   Vaping status: Never Used  Substance and Sexual Activity   Alcohol use: No   Drug use: No   Sexual activity: Not on file  Other Topics Concern   Not on file  Social History Narrative   Not on file   Social Drivers of Health   Financial Resource Strain: Not on file  Food Insecurity: No Food Insecurity (07/23/2023)   Hunger Vital Sign    Worried About Running Out of Food in the Last Year: Never true    Ran Out of Food in the Last Year: Never true  Transportation Needs: No Transportation Needs (07/23/2023)   PRAPARE - Administrator, Civil Service (Medical): No    Lack of Transportation (Non-Medical): No  Physical Activity: Not on file  Stress: Not on file  Social Connections: Not  on file   Social History   Tobacco Use  Smoking Status Never  Smokeless Tobacco Never   Social History   Substance and Sexual Activity  Alcohol Use No   Social History   Substance and Sexual Activity  Drug Use No    Additional pertinent information group home.  FAMILY HISTORY  History reviewed. No pertinent family history. Family Psychiatric History (if known):  denies  MENTAL STATUS EXAM (MSE)  Mental Status Exam: General Appearance: large woman in hospital scrubs  Orientation:  Full (Time, Place, and Person)  Memory:  no obvious deficits  Concentration:  fair  Recall:  fair  Attention  fair  Eye Contact:  fair  Speech:  Clear and Coherent  Language:  fluent english  Volume:  Normal  Mood: I need haloperidol  to help me relax. - patient  Affect:  Congruent  Thought Process:  linear, concrete  Thought Content:  hallucinations, +CAH to harm self  Suicidal Thoughts:  Yes.  with intent/plan  Homicidal Thoughts:  Yes.  without intent/plan  Judgement:  Impaired  Insight:  impaired  Psychomotor Activity:  no PMA/PMR at time of eval  Akathisia:  none evident  Fund of Knowledge:  fair      VITALS  Blood pressure 133/70, pulse 81, temperature 99.3 F (37.4 C), temperature source Oral, resp. rate 18, height 5' 2 (1.575 m), weight 128 kg, SpO2 100%.  LABS  Admission on 11/10/2023  Component Date Value Ref Range Status   Sodium 11/10/2023 141  135 - 145 mmol/L Final   Potassium 11/10/2023 3.7  3.5 - 5.1 mmol/L Final   Chloride 11/10/2023 104  98 - 111 mmol/L Final   CO2 11/10/2023 26  22 - 32 mmol/L Final   Glucose, Bld 11/10/2023 242 (H)  70 - 99 mg/dL Final   Glucose reference range applies only to samples taken after fasting for at least 8 hours.   BUN 11/10/2023 14  6 - 20 mg/dL Final   Creatinine, Ser 11/10/2023 1.03 (H)  0.44 - 1.00 mg/dL Final   Calcium  11/10/2023 9.4  8.9 - 10.3 mg/dL Final   Total Protein 92/87/7974 7.2  6.5 - 8.1 g/dL Final   Albumin  92/87/7974 3.7  3.5 - 5.0 g/dL Final   AST 92/87/7974 32  15 - 41 U/L Final   ALT 11/10/2023 27  0 - 44 U/L Final   Alkaline Phosphatase 11/10/2023 72  38 - 126 U/L Final   Total Bilirubin 11/10/2023 0.5  0.0 - 1.2 mg/dL Final   GFR, Estimated 11/10/2023 >60  >60 mL/min Final   Comment: (NOTE) Calculated using the CKD-EPI Creatinine Equation (2021)    Anion gap 11/10/2023 11  5 - 15 Final   Performed at [facility], [address] [place], [name] [address]   Alcohol, Ethyl (B) 11/10/2023 <15  <15 mg/dL Final   Comment: (NOTE) For medical purposes only. Performed at [facility], [address] [place], [name] [address]    WBC 11/10/2023 9.6  4.0 - 10.5 K/uL Final   RBC 11/10/2023 3.52 (L)  3.87 - 5.11 MIL/uL Final   Hemoglobin 11/10/2023 10.7 (L)  12.0 - 15.0 g/dL Final   HCT 92/87/7974 33.2 (L)  36.0 - 46.0 % Final   MCV 11/10/2023 94.3  80.0 - 100.0 fL Final   MCH 11/10/2023 30.4  26.0 - 34.0 pg Final   MCHC 11/10/2023 32.2  30.0 - 36.0 g/dL Final   RDW 92/87/7974 12.7  11.5 - 15.5 % Final   Platelets 11/10/2023 240  150 - 400 K/uL Final   nRBC 11/10/2023 0.0  0.0 - 0.2 % Final   Performed at [facility], [address] [place], [name] [address]   Tricyclic, Ur Screen 11/10/2023 POSITIVE (A)  NONE DETECTED Final   Amphetamines, Ur Screen 11/10/2023 NONE DETECTED  NONE DETECTED Final   MDMA (Ecstasy)Ur Screen 11/10/2023 NONE DETECTED  NONE DETECTED Final   Cocaine Metabolite,Ur [place] 11/10/2023 NONE DETECTED  NONE DETECTED Final   Opiate, Ur Screen 11/10/2023 NONE DETECTED  NONE DETECTED Final   Phencyclidine (PCP) Ur S 11/10/2023 NONE DETECTED  NONE DETECTED Final   Cannabinoid 50 Ng, Ur [place] 11/10/2023 NONE  DETECTED  NONE DETECTED Final   Barbiturates, Ur Screen 11/10/2023 NONE DETECTED  NONE DETECTED Final   Benzodiazepine, Ur Scrn 11/10/2023 POSITIVE (A)  NONE DETECTED Final   Methadone Scn, Ur 11/10/2023 NONE DETECTED  NONE DETECTED Final    Comment: (NOTE) Tricyclics + metabolites, urine    Cutoff 1000 ng/mL Amphetamines + metabolites, urine  Cutoff 1000 ng/mL MDMA (Ecstasy), urine              Cutoff 500 ng/mL Cocaine Metabolite, urine          Cutoff 300 ng/mL Opiate + metabolites, urine        Cutoff 300 ng/mL Phencyclidine (PCP), urine         Cutoff 25 ng/mL Cannabinoid, urine                 Cutoff 50 ng/mL Barbiturates + metabolites, urine  Cutoff 200 ng/mL Benzodiazepine, urine              Cutoff 200 ng/mL Methadone, urine                   Cutoff 300 ng/mL  The urine drug screen provides only a preliminary, unconfirmed analytical test result and should not be used for non-medical purposes. Clinical consideration and professional judgment should be applied to any positive drug screen result due to possible interfering substances. A more specific alternate chemical method must be used in order to obtain a confirmed analytical result. Gas chromatography / mass spectrometry (GC/MS) is the preferred confirm                          atory method. Performed at [facility], [address] [place], [name] [address]    Preg Test, Ur 11/10/2023 NEGATIVE  NEGATIVE Final   Comment:        THE SENSITIVITY OF THIS METHODOLOGY IS >20 mIU/mL.     PSYCHIATRIC REVIEW OF SYSTEMS (ROS)  ROS: Notable for the following relevant positive findings: ROS depressed mood, hopelessness, homicidal ideation, suicidal ideation, anxiety, hallucinations  Additional findings:      Musculoskeletal: No abnormal movements observed      Gait & Station: Normal      Pain Screening: Denies      Nutrition & Dental Concerns: Decrease in food intake and/or loss of appetite  RISK FORMULATION/ASSESSMENT  Is the patient experiencing any suicidal or homicidal ideations: Yes       Explain if yes: SI with plan, CAH, and HI Protective factors considered for safety management: prior treatment response  Risk factors/concerns considered for  safety management:  Prior attempt Depression Physical illness/chronic pain Impulsivity  Is there a safety management plan with the patient and treatment team to minimize risk factors and promote protective factors: Yes           Explain: meds, consideration for psych admission if patient does not improve Is crisis care placement or psychiatric hospitalization recommended: Yes     Based on my current evaluation and risk assessment, patient is determined at this time to be at:  High risk  *RISK ASSESSMENT Risk assessment is a dynamic process; it is possible that this patient's condition, and risk level, may change. This should be re-evaluated and managed over time as appropriate. Please re-consult psychiatric consult services if additional assistance is needed in terms of risk assessment and management. If your team decides to discharge this patient, please advise the patient how to best access  emergency psychiatric services, or to call 911, if their condition worsens or they feel unsafe in any way.   Nancyann LITTIE Alert, MD Telepsychiatry Consult Services

## 2023-11-10 NOTE — ED Notes (Signed)
 This tech obtained vital signs on pt.

## 2023-11-10 NOTE — ED Notes (Signed)
TTS consult at this time. 

## 2023-11-11 DIAGNOSIS — F259 Schizoaffective disorder, unspecified: Secondary | ICD-10-CM | POA: Diagnosis not present

## 2023-11-11 NOTE — ED Notes (Signed)
Pt was provided with a dinner tray. 

## 2023-11-11 NOTE — ED Notes (Signed)
Pt received snack and water. 

## 2023-11-11 NOTE — ED Notes (Signed)
Pt was provided with lunch tray.

## 2023-11-11 NOTE — BH Assessment (Signed)
 Pt referral re-faxed to: Destination  Service Provider Request Status Services Address Phone Fax Patient Preferred  [facility]  Pending - Request Sent -- [address], [name] [address] [phone] --  [facility]  Pending - Request [address] B[name] [address] [phone] --  [facility]  Pending - Request Sent -- [address] [name] [address] [phone] --  [facility]  Pending - Request Sent -- [address], [name] [address] [phone] --  [facility]  Pending - Request Sent -- [address], [name] [address] [phone] --  [facility]  Pending - Request Sent -- [address] [name] [address] [phone] --  [facility]  Pending - Request Sent -- [address] [place], [name] [address] [phone] --  [facility]  Pending - Request Sent -- [address] [name] [address] [phone] --  [facility]  Pending - Request Sent -- [address], [name] [address] [phone] --  [facility]  Pending - Request Sent -- [address] [name] [address] [phone] --  [facility]  Pending - Request Sent -- [address] [name] [address] [phone] --  [facility]  Pending - Request Sent -- [address] [name] [address] [phone] --  [facility]  Pending - Request Sent -- [address] [name] [phone] --  [facility]  Pending - Request Sent -- [address], [name] [address] [phone] --  [facility]  Pending - Request Sent -- [address] [name] [address] [phone] --

## 2023-11-11 NOTE — ED Notes (Signed)
 Spoke with [facility] worker who stated she would try to find someone to come get patient tonight and if not it would be in the morning, stated she would speak with [facility] director to discuss transport
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
## 2023-11-12 DIAGNOSIS — F259 Schizoaffective disorder, unspecified: Secondary | ICD-10-CM | POA: Diagnosis not present

## 2023-11-12 NOTE — ED Notes (Signed)
 Lunch provided at bedside for pt.

## 2023-11-12 NOTE — ED Notes (Signed)
 Dinner has been provided to pt.

## 2023-11-12 NOTE — ED Notes (Addendum)
 Pt knocking on door of locked 3 bed area. Pt states she needs to take her medications. Pt oriented to time and informed that she will be given her medications at 8am and 10am when they are due to be given. Pt verbalized understanding and walked back to her room and got into bed.

## 2023-11-12 NOTE — Progress Notes (Signed)
 Patient has been referred to the following facilities:   Service Provider Phone  [facility]  [phone]  [facility]  [phone]  [facility]  [phone]  [facility]  [phone]  [facility] [facility]  [phone]  [facility]  [phone]  [facility]  [phone]  [facility]  [phone]  [facility]  [phone]  [facility]  [phone]  [facility]  [phone]  [facility]  [phone]  [facility]  [phone]  [facility]  [phone]  [facility]  [phone]  [facility]  [phone]  [facility]  [phone]  [facility]  [phone]  [facility]  [phone], [name] [phone]

## 2023-11-12 NOTE — ED Notes (Signed)
VOL/Pending Placement 

## 2023-11-12 NOTE — ED Notes (Signed)
 Pt given snack and drink at this time. Pt is well appearing. Pt denies no other needs at this time.

## 2023-11-12 NOTE — ED Notes (Signed)
Pt up to restroom. No distress noted.

## 2023-11-12 NOTE — ED Notes (Signed)
.  Meal tray provided. Tray checked for potential hazards. Pt denies no other needs at this time.

## 2023-11-12 NOTE — ED Notes (Signed)
 Give pt shower items so she can take a shower.

## 2023-11-12 NOTE — ED Notes (Signed)
 Assumed care of pt. Report received from previous RN. Pt alert and oriented. Pt RR even and unlabored. Pt denies pain at this time. Pt calm and cooperative. Pt denies any needs at this time.

## 2023-11-12 NOTE — ED Notes (Signed)
 Breakfast provided at bedside to pt.
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
## 2023-11-13 DIAGNOSIS — F259 Schizoaffective disorder, unspecified: Secondary | ICD-10-CM | POA: Diagnosis not present

## 2023-11-13 MED ORDER — CHLORHEXIDINE GLUCONATE 0.12 % MT SOLN: 15.0000 mL | Freq: Two times a day (BID) | OROMUCOSAL | Status: DC

## 2023-11-13 NOTE — ED Notes (Signed)
 Dinner tray given

## 2023-11-13 NOTE — ED Notes (Signed)
Pt sitting in day room at this time

## 2023-11-13 NOTE — ED Notes (Signed)
 Snacks and water given to pt

## 2023-11-13 NOTE — BH Assessment (Signed)
 Clinical research associate received call from [facility]. Spoke to [place] who declined patient due to IDD dx.
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
## 2023-11-13 NOTE — Progress Notes (Addendum)
 Patient was re-faxed out to the following facilities:      Service Provider Phone  [facility]  [phone]  [facility]  [phone]  [facility]  [phone]  [facility]  [phone]  [facility]  [phone]  [facility]  [phone]  [facility]  [phone]  [facility]  [phone]  [facility]  [phone]  [facility]  [phone]  [facility]  [phone]  [facility]  [phone]  [facility]  [phone]  [facility]  [phone]  [facility]  [phone]  [facility]  [phone]  [facility]  [phone]  [facility]  [phone]  [facility]  [phone], [name] [phone]

## 2023-11-13 NOTE — ED Notes (Addendum)
 Meal tray provided by tech at this time. Tray checked for potential hazards. Pt denies no other needs at this time.

## 2023-11-13 NOTE — ED Notes (Signed)
VOL  PENDING  PLACEMENT 

## 2023-11-13 NOTE — ED Notes (Signed)
 vol/consult done/recommended for inpatient psychiatric hospitalization.

## 2023-11-13 NOTE — ED Notes (Signed)
 Assumed care of pt. Report received from previous RN. Pt alert and oriented. Pt RR even and unlabored. Pt denies pain at this time. Pt calm and cooperative. Pt denies any needs at this time.

## 2023-11-13 NOTE — ED Notes (Addendum)
 Pt showering this AM. Tech gave pt safe cleaning supplies, towel, and clean scrubs.

## 2023-11-14 DIAGNOSIS — F259 Schizoaffective disorder, unspecified: Secondary | ICD-10-CM | POA: Diagnosis not present

## 2023-11-14 NOTE — ED Notes (Signed)
 Assumed care of patient, pt currently resting in room on bed, no distress noted, respirations even and unlabored medicated for dental pain.

## 2023-11-14 NOTE — ED Notes (Signed)
 Breakfast meal tray given at this time by [place], NT

## 2023-11-14 NOTE — ED Notes (Addendum)
 Pt calm and cooperative, pt continuously asking when she would be moved downstairs and when a room would become available. This RN reiterated multiple times that no bed has become available and no one has accepted the pt as of yet. Reassured pt that if this RN is working and a bed is offered, this RN would tell her. Pt also called to group home to see if caregiver could bring some of her belongings, spoke with group home worker and pt and explained that they are more than welcomed to bring belongings but if they brought belonging they would be locked up with the rest of her belongings until she is moved. Both the pt and caregiver verbalized understanding at this time.

## 2023-11-14 NOTE — ED Notes (Signed)
VOL/Pending Placement 

## 2023-11-14 NOTE — ED Notes (Signed)
 Patient got night snack and a drink.

## 2023-11-14 NOTE — ED Notes (Signed)
Pt given dinner tray. Pt calm and cooperative

## 2023-11-14 NOTE — Progress Notes (Signed)
 Patient was re-faxed out to the following facilities:   Service Provider Phone  [facility]  [phone]  [facility]  [phone]  [facility]  [phone]  [facility]  [phone]  [facility]  [phone]  [facility]  [phone]  [facility]  [phone]  [facility]  [phone]  [facility]  [phone]  [facility]  [phone]  [facility]  [phone]  [facility]  [phone]  [facility]  [phone]  [facility]  [phone]  [facility]  [phone]  [facility]  [phone]  [facility]  [phone]  [facility]  [phone]  [facility]  [phone], [name] [phone]
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
## 2023-11-14 NOTE — ED Notes (Signed)
 Pt reports that her teeth are hurting and would like something for it. This RN messaged Dr. Tan at this time to see if Wetzel could be provided.

## 2023-11-14 NOTE — ED Notes (Signed)
 Lunch meal tray given at this time by [place], NT

## 2023-11-15 DIAGNOSIS — F259 Schizoaffective disorder, unspecified: Secondary | ICD-10-CM | POA: Diagnosis not present
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
## 2023-11-15 NOTE — ED Notes (Signed)
Vol /pending placement 

## 2023-11-15 NOTE — ED Notes (Signed)
 Attempted to call group home x 3, no answer.  Will continue to call.

## 2023-11-15 NOTE — ED Notes (Signed)
 Vol/Group home has discharged patient, Patient is now TOC follow up with new placement.

## 2023-11-15 NOTE — Discharge Instructions (Addendum)
 Your prescriptions were sent

## 2023-11-15 NOTE — ED Notes (Signed)
 Pt was provided supper tray.

## 2023-11-15 NOTE — ED Notes (Signed)
 Attempted to call group home x 2, no answer.  Spoke with pt mother who will also attempt. Will continue to call.

## 2023-11-15 NOTE — ED Notes (Signed)
 During nursing assessment Dawn Lambert was A/Ox 4 .  She  stated that she did not currently have thoughts or feelings of SI/HI.  Dawn Lambert reported that she was not currently having auditory or visual hallucinations.  Pt affect is appropriate , eye contact is steady , speech is normal  with appropriate verbiage noted.  Staff addressed any feelings or concerns that have been brought up.  Medications were administered as ordered. Continue to monitor patient as ordered for any changes in behaviors and for continued safety.
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
## 2023-11-15 NOTE — ED Notes (Signed)
Pt given snack at this time  

## 2023-11-16 DIAGNOSIS — F259 Schizoaffective disorder, unspecified: Secondary | ICD-10-CM | POA: Diagnosis not present

## 2023-11-16 NOTE — ED Notes (Signed)
 VOL/pending TOC placement

## 2023-11-16 NOTE — ED Notes (Signed)
 Pt given lunch at bedside

## 2023-11-16 NOTE — ED Notes (Signed)
 Pt given phone to place a call

## 2023-11-16 NOTE — ED Notes (Signed)
 Snack given.

## 2023-11-16 NOTE — ED Notes (Signed)
 Dinner tray provided to pt

## 2023-11-16 NOTE — ED Notes (Signed)
 Pt had a visitors in regards to her stay

## 2023-11-16 NOTE — ED Notes (Signed)
 Pt up to restroom and back to their room. No other needs at this time.

## 2023-11-16 NOTE — ED Notes (Signed)
 Assumed care of patient, pt sitting in dayroom, no distress noted

## 2023-11-16 NOTE — ED Notes (Signed)
 Pt provided breakfast tray.

## 2023-11-16 NOTE — ED Notes (Signed)
VOL/pending discharge 

## 2023-11-17 DIAGNOSIS — F259 Schizoaffective disorder, unspecified: Secondary | ICD-10-CM | POA: Diagnosis not present
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
## 2023-11-17 NOTE — ED Provider Notes (Signed)
   [facility] Observation Note   ----------------------------------------- 8:37 AM on 11/17/2023 -----------------------------------------  Dawn Lambert is a 33 y.o. female currently boarding in the Emergency Department.  No acute events since last update.  Recent Vitals   Most recent vital signs: Vitals:   11/16/23 2225 11/17/23 0834  BP: (!) 116/54 106/61  Pulse: 73 77  Resp: 18 18  Temp: 98.5 F (36.9 C) 99.2 F (37.3 C)  SpO2: 99% 98%    ED Results / Procedures / Treatments   Labs (all labs ordered are listed, but only abnormal results are displayed) Labs Reviewed  COMPREHENSIVE METABOLIC PANEL WITH GFR - Abnormal; Notable for the following components:      Result Value   Glucose, Bld 242 (*)    Creatinine, Ser 1.03 (*)    All other components within normal limits  CBC - Abnormal; Notable for the following components:   RBC 3.52 (*)    Hemoglobin 10.7 (*)    HCT 33.2 (*)    All other components within normal limits  URINE DRUG SCREEN, QUALITATIVE ([facility] ONLY) - Abnormal; Notable for the following components:   Tricyclic, Ur Screen POSITIVE (*)    Benzodiazepine, Ur Scrn POSITIVE (*)    All other components within normal limits  ETHANOL  POC URINE PREG, ED    MEDICATIONS ORDERED IN ED: Medications  ibuprofen  (ADVIL ) tablet 600 mg (600 mg Oral Given 11/17/23 0317)  ondansetron  (ZOFRAN ) tablet 4 mg (4 mg Oral Given 11/12/23 1226)  alum & mag hydroxide-simeth (MAALOX/MYLANTA) 200-200-20 MG/5ML suspension 30 mL (has no administration in time range)  carbamazepine  (TEGRETOL ) tablet 400 mg (400 mg Oral Given 11/16/23 2200)  loratadine  (CLARITIN ) tablet 10 mg (10 mg Oral Given 11/16/23 0923)  cholecalciferol  (VITAMIN D3) 25 MCG (1000 UNIT) tablet 1,000 Units (1,000 Units Oral Given 11/16/23 0922)  clonazePAM  (KLONOPIN ) tablet 1 mg (1 mg Oral Given 11/16/23 2200)  gabapentin  (NEURONTIN ) capsule 800 mg (800 mg Oral Given 11/16/23 2159)   levothyroxine  (SYNTHROID ) tablet 175 mcg (175 mcg Oral Given 11/16/23 0921)  lisinopril  (ZESTRIL ) tablet 10 mg (10 mg Oral Given 11/16/23 9077)  multivitamin with minerals tablet 1 tablet (1 tablet Oral Given 11/16/23 0923)  propranolol  (INDERAL ) tablet 40 mg (40 mg Oral Given 11/16/23 2201)  haloperidol  (HALDOL ) tablet 5 mg (5 mg Oral Given 11/16/23 2201)  lithium  carbonate capsule 300 mg (300 mg Oral Given 11/16/23 0922)  lithium  carbonate capsule 600 mg (600 mg Oral Given 11/16/23 2200)     ED Plan   Currently awaiting placement into an appropriate living facility.  Social work is working with the patient to help achieve this.      Dorothyann Drivers, MD 11/17/23 [phone]

## 2023-11-17 NOTE — ED Notes (Signed)
 Pt given snack and a cup of water at this time.

## 2023-11-17 NOTE — ED Notes (Addendum)
 Pt has been Pacing the floor  all day none STOPPED constantly asking questions and at the nursing station door

## 2023-11-17 NOTE — ED Notes (Signed)
Pt given breakfast at bedside.

## 2023-11-17 NOTE — ED Notes (Signed)
 Pt breakfast provided at bedside

## 2023-11-17 NOTE — ED Notes (Signed)
 Pt been given Phone to place call to [facility]

## 2023-11-17 NOTE — ED Notes (Signed)
 Pt Dinner given at bedside

## 2023-11-17 NOTE — ED Notes (Signed)
 Pt given supplies to shower.  Leftover supplies disposed of properly.

## 2023-11-17 NOTE — ED Notes (Signed)
 Pt Provided with a snack w/ Drink

## 2023-11-17 NOTE — ED Notes (Signed)
 Pt given shower and hygiene supplies and directed to take a shower. Extra trash disposed of properly.

## 2023-11-17 NOTE — ED Notes (Signed)
 Pt lunch provided at bedside

## 2023-11-18 DIAGNOSIS — F259 Schizoaffective disorder, unspecified: Secondary | ICD-10-CM | POA: Diagnosis not present

## 2023-11-18 NOTE — ED Notes (Signed)
 Dinner tray provided to pt

## 2023-11-18 NOTE — ED Notes (Signed)
 Pt provided with morning snack and water.

## 2023-11-18 NOTE — ED Notes (Signed)
 Lunch tray provided to pt.

## 2023-11-18 NOTE — ED Notes (Signed)
PT ambulated to the bathroom.

## 2023-11-18 NOTE — ED Notes (Signed)
Pt provided with breakfast tray and beverage

## 2023-11-18 NOTE — ED Notes (Signed)
 Assumed care of patient, pt resting in bed in room, no distress noted, respirations even and unlabored

## 2023-11-18 NOTE — ED Notes (Signed)
Snacks given to pt.

## 2023-11-18 NOTE — ED Notes (Signed)
 Pt provided with shower supplies including scrub pants and shirt, socks, underwear, towels, washcloths, and hygiene items. Pt is currently in shower.
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
## 2023-11-18 NOTE — ED Notes (Addendum)
 This tech changed pts bed linens and did a bed check. Pt noted to have stored a bunch of snacks under mattress including lays chips, pretzels, graham crackers, and peanut butter. Hygiene items were also found including deodorant, toothpaste and toothbrushes. All items have now been disposed of and RN Sonny was made aware.

## 2023-11-18 NOTE — ED Notes (Signed)
 Vol/psych cleared/pending disposition.

## 2023-11-18 NOTE — ED Notes (Signed)
 Pt is now out of shower, all items have been disposed of properly.

## 2023-11-18 NOTE — ED Notes (Signed)
Patient is vol pending placement 

## 2023-11-18 NOTE — ED Notes (Signed)
 Snacks and water given to pt

## 2023-11-19 DIAGNOSIS — F259 Schizoaffective disorder, unspecified: Secondary | ICD-10-CM | POA: Diagnosis not present

## 2023-11-19 NOTE — TOC Progression Note (Addendum)
 Transition of Care [facility]) - Progression Note    Patient Details  Name: [name] MRN: [id number] Date of Birth: 03-Sep-1990  Transition of [facility]) CM/SW Contact  [name], LCSW Phone Number: 11/19/2023, 11:06 AM  Clinical Narrative:     SW contacted [name] Franks, [employer] Care Manager with [employer] at [phone].  [name] expressed that there are notupdates and that she working on new placement.  Pt is appropriate for Group home or AFL.      2:47pm- SW met with pt per pt request.  Pt asked sw about FLA homes and when will she be placed.  SW expressed to pt that placement takes time and sw is not able to give time frame.  SW expressed to pt that sw is not familiar with FLA but if pt is referring to [facility], sw stated that it has max of 6 beds.    Expected Discharge Plan and Services                                               Social Determinants of Health (SDOH) Interventions SDOH Screenings   Food Insecurity: No Food Insecurity (07/23/2023)  Housing: Unknown (07/23/2023)  Transportation Needs: No Transportation Needs (07/23/2023)  Utilities: Not At Risk (07/23/2023)  Depression (PHQ2-9): Low Risk  (07/23/2023)  Tobacco Use: Low Risk  (11/10/2023)    Readmission Risk Interventions     No data to display

## 2023-11-19 NOTE — ED Notes (Signed)
VOL/Pending Placement 

## 2023-11-19 NOTE — ED Notes (Signed)
 Assumed care of patient, pt up to bathroom, no distress noted
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
## 2023-11-19 NOTE — ED Notes (Signed)
 Hospital meal provided, pt tolerated w/o complaints.  Waste discarded appropriately.

## 2023-11-20 DIAGNOSIS — F259 Schizoaffective disorder, unspecified: Secondary | ICD-10-CM | POA: Diagnosis not present

## 2023-11-20 MED ORDER — NYSTATIN 100000 UNIT/GM EX POWD
Freq: Two times a day (BID) | CUTANEOUS | Status: DC
  Administered 2023-11-29: 100000 [IU] via TOPICAL
  Filled 2023-11-20 (×3): qty 15

## 2023-11-20 NOTE — ED Notes (Signed)
 Pt has constantly been on the door repeatedly asking for snacks,phone etc.
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
## 2023-11-20 NOTE — TOC Progression Note (Signed)
 Transition of Care [facility]) - Progression Note    Patient Details  Name: Dawn Lambert MRN: [id number] Date of Birth: 07-Oct-1990  Transition of Care [facility]) CM/SW Contact  [name], LCSW Phone Number: 11/20/2023, 1:25 PM  Clinical Narrative:      SW received message from and spoke with Jonette Silvius inquiring about pt placement and guardian.  SW expressed that sw spoke with case worker, [name] Franks, [employer] Care Manager with [employer], and it was stated that she will be looking for placement for pt and pt can't return to group home.  SW expressed that due to pt behaviors she will be difficult to place.  Jonette Silvius asked if sw has spoken with guardian and group home.  SW expressed that sw has not spoken with guardian or group home at this time and that it is noted in chart that pt is not able to return back to group home or live with guardian (mom).  Jonette stated that she will follow up with group home to see if they will take pt back. SW contacted mom at [phone].  Mom stated that she begged group home to take pt back yesterday but they said they would not.  SW asked mom if she plans on visit pt at hospital and mom stated that she is 2 hours away (lives in [place]).  Mom is not able to care for pt due to pt behaviors and stated that she has spoken with [name] and [name] is actively looking for placement.       Expected Discharge Plan and Services                                               Social Determinants of Health (SDOH) Interventions SDOH Screenings   Food Insecurity: No Food Insecurity (07/23/2023)  Housing: Unknown (07/23/2023)  Transportation Needs: No Transportation Needs (07/23/2023)  Utilities: Not At Risk (07/23/2023)  Depression (PHQ2-9): Low Risk  (07/23/2023)  Tobacco Use: Low Risk  (11/10/2023)    Readmission Risk Interventions     No data to display

## 2023-11-20 NOTE — ED Notes (Signed)
 Vol/ TOC pending placement

## 2023-11-20 NOTE — ED Notes (Addendum)
 Pt given lunch at bedside

## 2023-11-20 NOTE — ED Notes (Signed)
 Assumed care of patient, pt resting in bed in room with eyes shut, no distress noted, respirations even and unlabored

## 2023-11-20 NOTE — ED Notes (Signed)
 VOl TOC

## 2023-11-20 NOTE — ED Notes (Signed)
 Pt Breakfast provided at bedside

## 2023-11-20 NOTE — ED Notes (Signed)
 Dinner meal provided for patient.

## 2023-11-21 DIAGNOSIS — F259 Schizoaffective disorder, unspecified: Secondary | ICD-10-CM | POA: Diagnosis not present
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
## 2023-11-21 NOTE — ED Notes (Signed)
 Pt. Alert and oriented, warm and dry, in no distress. Pt. Denies SI, HI, and AVH. Pt verbalizing she is leaving Friday per Child psychotherapist. Pt. Encouraged to let nursing staff know of any concerns or needs.  ENVIRONMENTAL ASSESSMENT Potentially harmful objects out of patient reach: Yes.   Personal belongings secured: Yes.   Patient dressed in hospital provided attire only: Yes.   Plastic bags out of patient reach: Yes.   Patient care equipment (cords, cables, call bells, lines, and drains) shortened, removed, or accounted for: Yes.   Equipment and supplies removed from bottom of stretcher: Yes.   Potentially toxic materials out of patient reach: Yes.   Sharps container removed or out of patient reach: Yes.

## 2023-11-21 NOTE — ED Notes (Signed)
Snack was provided to pt.

## 2023-11-21 NOTE — Consult Note (Signed)
 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 who has been in the group home for 15 years she was evaluated by me couple of days ago at that time she endorsed suicidal thoughts but she has been requesting to go back to the group home she denies any suicidal homicidal thoughts she does have some impulsive behavior at times but at this time she is not exhibiting any behaviors and we are asking the guardian to coordinate with the group home to be able to get her discharge   Mental status examination 33 year old female who appears stated age alert oriented x3 mood is okay affect is labile thought process concrete thought content denies any suicidal homicidal thoughts insight and judgment at baseline   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 rate 18, height 5' 2 (1.575 m), weight 128 kg, SpO2 99%.   Current Facility-Administered Medications  Medication Dose Route Frequency Provider Last Rate Last Admin   alum & mag hydroxide-simeth (MAALOX/MYLANTA) 200-200-20 MG/5ML suspension 30 mL  30 mL Oral Q6H PRN Viviann Pastor, MD       carbamazepine  (TEGRETOL ) tablet 400 mg  400 mg Oral BID Viviann Pastor, MD   400 mg at 11/21/23 9063   cholecalciferol  (VITAMIN D3) 25 MCG (1000 UNIT) tablet 1,000 Units  1,000 Units Oral Daily Viviann Pastor, MD   1,000 Units at 11/21/23 9066   clonazePAM  (KLONOPIN ) tablet 1 mg  1 mg Oral BID Viviann Pastor, MD   1 mg at 11/21/23 9066   gabapentin  (NEURONTIN ) capsule 800 mg  800 mg Oral TID Viviann Pastor, MD   800 mg at 11/21/23 0932   haloperidol  (HALDOL ) tablet 5 mg  5 mg Oral QHS Viviann Pastor, MD   5 mg at 11/20/23 2115   ibuprofen  (ADVIL ) tablet 600 mg  600 mg Oral Q8H PRN Viviann Pastor, MD    600 mg at 11/20/23 2114   levothyroxine  (SYNTHROID ) tablet 175 mcg  175 mcg Oral QAC breakfast Viviann Pastor, MD   175 mcg at 11/21/23 9262   lisinopril  (ZESTRIL ) tablet 10 mg  10 mg Oral Daily Viviann Pastor, MD   10 mg at 11/21/23 9066   lithium  carbonate capsule 300 mg  300 mg Oral q AM Lenon Elsie HERO, RPH   300 mg at 11/21/23 0932   lithium  carbonate capsule 600 mg  600 mg Oral QHS Lenon Elsie HERO, RPH   600 mg at 11/20/23 2114   loratadine  (CLARITIN ) tablet 10 mg  10 mg Oral Daily Viviann Pastor, MD   10 mg at 11/21/23 9066   multivitamin with minerals tablet 1 tablet  1 tablet Oral Daily Viviann Pastor, MD   1 tablet at 11/21/23 0932   nystatin  (MYCOSTATIN /NYSTOP ) topical powder   Topical BID Jessup, Charles, MD   Given at 11/21/23 9056   ondansetron  (ZOFRAN ) tablet 4 mg  4 mg Oral Q8H PRN Viviann Pastor, MD   4 mg at 11/17/23 1030   propranolol  (INDERAL ) tablet 40 mg  40 mg Oral BID Viviann Pastor, MD   40 mg at 11/21/23 9066   Current Outpatient Medications  Medication Sig Dispense Refill   acetaminophen  (TYLENOL ) 325 MG tablet Take 650 mg by mouth every 6 (six) hours as needed for mild pain or moderate pain.     albuterol  (VENTOLIN   HFA) 108 (90 Base) MCG/ACT inhaler Inhale 2 puffs into the lungs every 4 (four) hours as needed.     bismuth subsalicylate (PEPTO BISMOL) 262 MG/15ML suspension SMARTSIG:30 Milliliter(s) By Mouth 4 Times Daily PRN     carbamazepine  (TEGRETOL ) 200 MG tablet Take 400 mg by mouth 2 (two) times daily. With food     cetirizine (ZYRTEC) 10 MG tablet Take 10 mg by mouth daily as needed for allergies.     cholecalciferol  (VITAMIN D ) 1000 units tablet Take 1,000 Units by mouth daily.      clonazePAM  (KLONOPIN ) 1 MG tablet Take 1 mg by mouth 2 (two) times daily.     Fluocinolone  Acetonide Scalp 0.01 % OIL Apply to aa's scalp QD on Monday, Wednesday and Friday x 6 weeks. After 6 wks decrease to QW on Friday. May increase to QD on Monday,  Wednesday, and Friday during times of flares. 118.28 mL 11   fluticasone  (FLONASE ) 50 MCG/ACT nasal spray Place 1 spray into both nostrils daily as needed for allergies or rhinitis.     gabapentin  (NEURONTIN ) 800 MG tablet Take 800 mg by mouth 3 (three) times daily.     Guaifenesin  1200 MG TB12 Take 1,200 mg by mouth daily as needed (Congestion).     ibuprofen  (ADVIL ) 600 MG tablet Take 600 mg by mouth every 6 (six) hours as needed.     INVEGA  TRINZA 819 MG/2.63ML injection Inject 819 mg into the muscle every 3 (three) months.     levothyroxine  (SYNTHROID , LEVOTHROID) 175 MCG tablet Take 175 mcg by mouth daily before breakfast.     lidocaine  (XYLOCAINE ) 2 % solution Use as directed 15 mLs in the mouth or throat as needed for mouth pain.     lisinopril  (ZESTRIL ) 10 MG tablet Take 10 mg by mouth daily.     lithium  carbonate 300 MG capsule Take 300-600 mg by mouth 2 (two) times daily. Take 300 mg by mouth every morning 600 mg At bedtime     LORazepam  ER (LOREEV XR ) 2 MG CS24 Take 2 mg by mouth in the morning.     magnesium  hydroxide (MILK OF MAGNESIA) 400 MG/5ML suspension Take 30 mLs by mouth at bedtime as needed for mild constipation.     meloxicam  (MOBIC ) 15 MG tablet Take 1 tablet by mouth daily.     Multiple Vitamin (THEREMS PO) Take 1 tablet by mouth daily. Take with food     nystatin  powder Apply 1 Application topically 2 (two) times daily as needed (fungal infection). Apply under breast     propranolol  (INDERAL ) 40 MG tablet Take 40 mg by mouth 2 (two) times daily.     TRULICITY  4.5 MG/0.5ML SOAJ Inject 4.5 mg into the skin once a week. Wednesday     medroxyPROGESTERone (DEPO-PROVERA) 150 MG/ML injection Inject into the muscle. (Patient not taking: Reported on 09/27/2023)     ondansetron  (ZOFRAN -ODT) 8 MG disintegrating tablet Take 8 mg by mouth every 8 (eight) hours as needed. (Patient not taking: Reported on 09/27/2023)

## 2023-11-21 NOTE — ED Notes (Signed)
 VOL/TOC PENDING PLACEMENT.

## 2023-11-21 NOTE — ED Notes (Signed)
 Report given to Luke Gin, RN

## 2023-11-22 DIAGNOSIS — F259 Schizoaffective disorder, unspecified: Secondary | ICD-10-CM | POA: Diagnosis not present
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
## 2023-11-22 NOTE — Progress Notes (Signed)
   11/22/23 1100  Spiritual Encounters  Type of Visit Initial  Care provided to: Patient  Conversation partners present during encounter Nurse  Reason for visit Routine spiritual support  OnCall Visit Yes   Chaplain greeted patient as she was on the Unit but patient didn't appear to want any spiritual care.    Rev. Rana M. Nicholaus, M.Div. Chaplain Resident  [facility]

## 2023-11-22 NOTE — ED Notes (Signed)
 Breakfast was provided to pt at bedside, vitals where completed also

## 2023-11-22 NOTE — ED Notes (Signed)
 Dinner was provided at bedside.

## 2023-11-22 NOTE — ED Notes (Signed)
 Lunch was provided at bedside.

## 2023-11-22 NOTE — ED Notes (Signed)
 Snack and water was provided to the pt

## 2023-11-22 NOTE — ED Notes (Signed)
 Pt was given a snack and now she is taking a shower.

## 2023-11-22 NOTE — ED Notes (Signed)
vol/toc placement.. 

## 2023-11-22 NOTE — ED Notes (Signed)
Snacks given to pt.

## 2023-11-23 DIAGNOSIS — F259 Schizoaffective disorder, unspecified: Secondary | ICD-10-CM | POA: Diagnosis not present

## 2023-11-23 LAB — BASIC METABOLIC PANEL WITH GFR
Anion gap: 12 (ref 5–15)
BUN: 17 mg/dL (ref 6–20)
CO2: 26 mmol/L (ref 22–32)
Calcium: 9.3 mg/dL (ref 8.9–10.3)
Chloride: 101 mmol/L (ref 98–111)
Creatinine, Ser: 1.05 mg/dL — ABNORMAL HIGH (ref 0.44–1.00)
GFR, Estimated: >60 mL/min (ref >60)
Glucose, Bld: 259 mg/dL — ABNORMAL HIGH (ref 70–99)
Potassium: 3.8 mmol/L (ref 3.5–5.1)
Sodium: 139 mmol/L (ref 135–145)

## 2023-11-23 LAB — LITHIUM LEVEL: Lithium Lvl: 0.98 mmol/L (ref 0.60–1.20)

## 2023-11-23 NOTE — ED Notes (Signed)
Dinner tray and water provided to pt

## 2023-11-23 NOTE — TOC CM/SW Note (Signed)
..  Transition of Care [facility]) - Inpatient Brief Assessment   Patient Details  Name: Dawn Lambert MRN: [id number] Date of Birth: 26-Apr-1991  [facility]) CM/SW Contact:    Dawn [name], LCSW Phone Number: 11/23/2023, 10:58 AM   Clinical Narrative:  SW received message that [place] from [facility] and [place] is sending a representative from a group home today to evaluate the patient for possible placement.  Transition of Care Asessment:

## 2023-11-23 NOTE — ED Notes (Signed)
 Lunch tray provided to pt.

## 2023-11-23 NOTE — ED Notes (Signed)
 Pt provided with breakfast tray and orange juice, Pt placed tray in the trash when finished

## 2023-11-23 NOTE — ED Notes (Signed)
 Pt received snack in dayroom

## 2023-11-23 NOTE — ED Notes (Signed)
 Snacks provided to pt
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
## 2023-11-23 NOTE — ED Notes (Signed)
 Pt provided with new paper scrubs, mesh underwear, clean socks, deoderant, soap, towels, and wash clothes. Pt currently in shower

## 2023-11-24 DIAGNOSIS — F259 Schizoaffective disorder, unspecified: Secondary | ICD-10-CM | POA: Diagnosis not present

## 2023-11-24 NOTE — ED Notes (Signed)
 Snacks provided to pt

## 2023-11-24 NOTE — ED Notes (Signed)
Pt provided with breakfast tray and beverage

## 2023-11-24 NOTE — ED Notes (Signed)
 Pt was provided supper tray.

## 2023-11-24 NOTE — ED Notes (Signed)
VOL/TOC pending placement
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
## 2023-11-24 NOTE — ED Notes (Signed)
Pt provided with lunch tray and beverage  

## 2023-11-24 NOTE — ED Notes (Signed)
 This tech offered pt a shower and pt refused.

## 2023-11-25 DIAGNOSIS — F259 Schizoaffective disorder, unspecified: Secondary | ICD-10-CM | POA: Diagnosis not present

## 2023-11-25 NOTE — ED Notes (Signed)
 Pt given snack and drink. Pt calm and cooperative.

## 2023-11-25 NOTE — Progress Notes (Signed)
   11/25/23 1015  Spiritual Encounters  Type of Visit Initial  Care provided to: Patient  Conversation partners present during encounter Nurse  Referral source Patient request;Nurse (RN/NT/LPN)  Reason for visit Routine spiritual support  OnCall Visit Yes   Chaplain visited patient per an [place] in the EPIC system.  Patient shared she hadn't been able to go to [facility] in a couple of Sundays so she wanted to see the Chaplain.  Chaplain created a worship service for the patient.  Chaplain asked patient her favorite hymn and played Kathryne Fellows for the patient on her phone.  Chaplain read Psalm 91 to the patient and then discussed some of the verses by making it personal and inserting the patient's name.  Chaplain asked the patient what she wanted to pray for and her concern is placement in a good Group Home.  Chaplain prayed for these concerns with the patient and then provided a benediction or blessing.  Patient also said she wanted a Bible and Chaplain provided.   Rev. Rana M. Nicholaus, M.Div. Chaplain Resident [facility]
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
## 2023-11-25 NOTE — ED Notes (Signed)
VOL/Pending Placement 

## 2023-11-25 NOTE — ED Notes (Signed)
 Pt ambulated to the bathroom.

## 2023-11-25 NOTE — ED Notes (Signed)
 Pt visit with chaplain all complete. No issues to be noted.

## 2023-11-25 NOTE — ED Notes (Signed)
Patient is vol pending placement 

## 2023-11-25 NOTE — ED Notes (Signed)
 This tech provided pt with shower items including scrub top and bottom, underwear, socks, towels, washcloths, and hygiene items. Pt is currently in shower.

## 2023-11-25 NOTE — ED Notes (Signed)
Pt given morning snack

## 2023-11-25 NOTE — ED Notes (Signed)
Pt provided with snack and water

## 2023-11-25 NOTE — ED Notes (Signed)
 Pt complaining of headache, ibuprofen  offered to pt.

## 2023-11-25 NOTE — ED Notes (Signed)
 Pts linens changed at this time per pt request.

## 2023-11-25 NOTE — ED Notes (Signed)
 Pt is now out of shower. All items have been disposed of properly.

## 2023-11-25 NOTE — TOC CM/SW Note (Addendum)
..  Transition of Care [facility]) - Inpatient Brief Assessment   Patient Details  Name: ELAYSIA DEVARGAS MRN: [id number] Date of Birth: 09-09-1990  Transition of Care [facility]) CM/SW Contact:    [name], LCSW Phone Number: 11/25/2023, 4:41 PM   Clinical Narrative:  SW contacted [name] Franks, [employer] Care Manager with [facility] at [phone]. No answer.  SW left message asking if [facility] was able to see pt on Friday and if they offered any  feedback and if there are any other prospects regarding placement.   5:12pm- SW emailed [facility] for assistance with pt for placement and community resources  Transition of Care Asessment:

## 2023-11-25 NOTE — ED Notes (Signed)
Pt provided with lunch tray and beverage  

## 2023-11-25 NOTE — ED Notes (Signed)
 Pt in dayroom speaking with chaplain.

## 2023-11-25 NOTE — ED Notes (Signed)
 Pt given dinner tray an drink.

## 2023-11-26 ENCOUNTER — Inpatient Hospital Stay

## 2023-11-26 DIAGNOSIS — F259 Schizoaffective disorder, unspecified: Secondary | ICD-10-CM | POA: Diagnosis not present

## 2023-11-26 NOTE — ED Notes (Signed)
 Snack was provide for pt, grapes and chesse

## 2023-11-26 NOTE — ED Notes (Signed)
VOL/TOC Pending Placement

## 2023-11-26 NOTE — ED Notes (Signed)
 Lunch was provided for pt.

## 2023-11-26 NOTE — ED Notes (Signed)
 Dinner was provided to pt.

## 2023-11-26 NOTE — ED Notes (Signed)
 Pt is in the shower

## 2023-11-26 NOTE — ED Notes (Signed)
 Group home staff and [name], pts case manager speaking with patient in dayroom

## 2023-11-26 NOTE — ED Notes (Signed)
 Pt ambulated to the bathroom.
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
## 2023-11-26 NOTE — ED Notes (Signed)
 Pt given snack and drink. Pt calm and cooperative.

## 2023-11-26 NOTE — ED Notes (Signed)
 Assumed care of patient, pt resting in bed in room with eyes shut, respirations even and unlabored, no distress noted

## 2023-11-26 NOTE — ED Notes (Signed)
 Jello was given to pt for snack.

## 2023-11-26 NOTE — ED Notes (Signed)
 Pt ambulated back to room

## 2023-11-26 NOTE — ED Notes (Signed)
 Breakfast has been provided at bedside.

## 2023-11-27 DIAGNOSIS — F259 Schizoaffective disorder, unspecified: Secondary | ICD-10-CM | POA: Diagnosis not present

## 2023-11-27 NOTE — ED Notes (Signed)
 Pt given snack and drink at this time. Pt is well appearing. Pt denies no other needs at this time.

## 2023-11-27 NOTE — ED Notes (Signed)
 Hospital meal provided, pt tolerated w/o complaints.  Waste discarded appropriately.

## 2023-11-27 NOTE — ED Notes (Signed)
 VOL/TOC PENDING PLACEMENT.

## 2023-11-27 NOTE — ED Notes (Signed)
VOL/TOC pending placement

## 2023-11-27 NOTE — ED Notes (Signed)
 Pt given safe cleaning products for showering at this time.
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
## 2023-11-28 DIAGNOSIS — F259 Schizoaffective disorder, unspecified: Secondary | ICD-10-CM | POA: Diagnosis not present

## 2023-11-28 NOTE — ED Notes (Signed)
 VOL/TOC STILL PENDING PLACEMENT

## 2023-11-28 NOTE — ED Notes (Signed)
 Vol toc placement
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
## 2023-11-28 NOTE — ED Notes (Signed)
 Pt asking staff to call her case worker for an update. This nurse agreed to attempt to get in touch with her.

## 2023-11-28 NOTE — TOC Progression Note (Signed)
 Transition of Care [facility]) - Progression Note    Patient Details  Name: Dawn Lambert MRN: [id number] Date of Birth: 01/27/1991  Transition of Care [facility]) CM/SW Contact  Lauraine JAYSON Carpen, LCSW Phone Number: 11/28/2023, 1:29 PM  Clinical Narrative:  Spoke to Glenys with [employer]. Placement is still pending. Mom is going to tour a group home affiliated with [facility] on Monday.   Expected Discharge Plan and Services                                               Social Drivers of Health (SDOH) Interventions SDOH Screenings   Food Insecurity: No Food Insecurity (07/23/2023)  Housing: Unknown (07/23/2023)  Transportation Needs: No Transportation Needs (07/23/2023)  Utilities: Not At Risk (07/23/2023)  Depression (PHQ2-9): Low Risk  (07/23/2023)  Tobacco Use: Low Risk  (11/10/2023)    Readmission Risk Interventions     No data to display

## 2023-11-28 NOTE — ED Notes (Signed)
 Pt was provided supper tray.

## 2023-11-29 ENCOUNTER — Emergency Department

## 2023-11-29 DIAGNOSIS — F259 Schizoaffective disorder, unspecified: Secondary | ICD-10-CM | POA: Diagnosis not present

## 2023-11-29 LAB — URINALYSIS, COMPLETE (UACMP) WITH MICROSCOPIC
Bacteria, UA: NONE SEEN
Bilirubin Urine: NEGATIVE
Glucose, UA: NEGATIVE mg/dL
Hgb urine dipstick: NEGATIVE
Ketones, ur: NEGATIVE mg/dL
Leukocytes,Ua: NEGATIVE
Nitrite: NEGATIVE
Protein, ur: NEGATIVE mg/dL
Specific Gravity, Urine: 1.044 — ABNORMAL HIGH (ref 1.005–1.030)
pH: 6 (ref 5.0–8.0)

## 2023-11-29 LAB — BASIC METABOLIC PANEL WITH GFR
Anion gap: 11 (ref 5–15)
BUN: 17 mg/dL (ref 6–20)
CO2: 25 mmol/L (ref 22–32)
Calcium: 9.5 mg/dL (ref 8.9–10.3)
Chloride: 102 mmol/L (ref 98–111)
Creatinine, Ser: 0.93 mg/dL (ref 0.44–1.00)
GFR, Estimated: >60 mL/min (ref >60)
Glucose, Bld: 236 mg/dL — ABNORMAL HIGH (ref 70–99)
Potassium: 4.2 mmol/L (ref 3.5–5.1)
Sodium: 138 mmol/L (ref 135–145)

## 2023-11-29 MED ORDER — IOHEXOL 300 MG/ML  SOLN
80.0000 mL | Freq: Once | INTRAMUSCULAR | Status: AC | PRN
  Administered 2023-11-29: 80 mL via INTRAVENOUS

## 2023-11-29 NOTE — ED Notes (Signed)
 Pt continues to c/o frequent diarrhea, nausea and abdominal pain.  Dr. Waymond to bedside to assess pt.  New orders pending.
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
## 2023-11-29 NOTE — ED Notes (Signed)
 VOL/TOC STILL PENDING PLACEMENT

## 2023-11-29 NOTE — ED Notes (Signed)
 Pt reports several episodes of diarrhea this AM.  Pt also reports abdominal cramps with same.  EDP notified, no new orders, prefers to monitor for now.  Pt aware of same and agrees with POC.

## 2023-11-29 NOTE — ED Notes (Signed)
 Pt Breakfast Provided at bedside

## 2023-11-29 NOTE — ED Provider Notes (Signed)
-----------------------------------------   5:45 PM on 11/29/2023 ----------------------------------------- Patient's CT scan shows no significant findings possible debris in the bladder however.  Patient's urinalysis does not appear to show any concerning finding, no sign of UTI or hematuria.   Dorothyann Drivers, MD 11/29/23 1745

## 2023-11-29 NOTE — ED Notes (Signed)
 Pt has been given the phone to place a call

## 2023-11-29 NOTE — ED Notes (Signed)
 Pt lunch provided at bedside

## 2023-11-29 NOTE — ED Notes (Signed)
 Pt up to bathroom at this time

## 2023-11-29 NOTE — ED Notes (Signed)
Snacks given to pt.

## 2023-11-29 NOTE — ED Notes (Signed)
vol/toc placement.. 

## 2023-11-29 NOTE — ED Notes (Signed)
 Pt REFUSED Dinner been complaining of Diarrhea. Asked for only Fluids

## 2023-11-30 DIAGNOSIS — F259 Schizoaffective disorder, unspecified: Secondary | ICD-10-CM | POA: Diagnosis not present

## 2023-11-30 NOTE — ED Notes (Signed)
Snacks given to pt.

## 2023-11-30 NOTE — ED Notes (Signed)
 Pt performed shower/hygiene tasks willingly and independently.

## 2023-11-30 NOTE — TOC CM/SW Note (Signed)
.  Transition of Care [facility]) - Inpatient Brief Assessment   Patient Details  Name: Dawn Lambert MRN: [id number] Date of Birth: 1990/12/21  Transition of Care [facility]) CM/SW Contact:    [name], LCSW Phone Number: 11/30/2023, 2:50 PM   Clinical Narrative:  SW received call from [employer] [phone], [email or web address].  [name] requested a care manager extender to assist with placement.  SW expressed to [place] that pt is medically stable and needs placement.  [employer] will be following up with SW regarding placement. SW suggested respite and sw also email [employer] that placement outside the county along with [facility] and [facility] might be options for pt as well.    Transition of Care Asessment:

## 2023-11-30 NOTE — ED Notes (Signed)
 Pt provided lunch tray and beverage in the day room

## 2023-11-30 NOTE — ED Notes (Addendum)
 Pt provided with breakfast tray and orange juice in the dayroom

## 2023-11-30 NOTE — ED Notes (Signed)
 Dinner tray served
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
## 2023-12-01 DIAGNOSIS — F259 Schizoaffective disorder, unspecified: Secondary | ICD-10-CM | POA: Diagnosis not present

## 2023-12-01 NOTE — ED Notes (Signed)
Pt. Given snack. 

## 2023-12-01 NOTE — ED Notes (Signed)
 Pt received evening snack . Room was discarded of any waste left from lunch, day snack , and dinner.

## 2023-12-01 NOTE — ED Notes (Signed)
 Pt Lunch provided at bedside

## 2023-12-01 NOTE — Progress Notes (Signed)
 Patient continues to await placement. Continues to be stable from a psychiatric standpoint. There has been no use of PRN medications in the past 24 hours. She has been calm, cooperative, and compliant. She is alert and oriented x3, engages minimally, and does not appear to be responding to internal stimuli. Continues to be at her baseline. Denies SI, HI, AVH at this time.
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
## 2023-12-01 NOTE — ED Notes (Signed)
 Pt given breakfast tray

## 2023-12-02 DIAGNOSIS — F259 Schizoaffective disorder, unspecified: Secondary | ICD-10-CM | POA: Diagnosis not present

## 2023-12-02 NOTE — ED Notes (Signed)
 Hospital meal provided, pt tolerated w/o complaints.  Waste discarded appropriately.

## 2023-12-02 NOTE — ED Notes (Signed)
Report received, assumed care of patient.

## 2023-12-02 NOTE — ED Notes (Signed)
 Pt provided with snack in the day room
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
## 2023-12-02 NOTE — ED Notes (Signed)
 VOL/TOC PENDING PLACEMENT.

## 2023-12-02 NOTE — ED Notes (Signed)
Snack was provided to pt.

## 2023-12-02 NOTE — ED Notes (Signed)
Pt provided lunch tray at bedside.  

## 2023-12-02 NOTE — ED Notes (Signed)
VOL/TOC Pending Placement

## 2023-12-03 DIAGNOSIS — F259 Schizoaffective disorder, unspecified: Secondary | ICD-10-CM | POA: Diagnosis not present

## 2023-12-03 NOTE — TOC CM/SW Note (Signed)
..  Transition of Care [facility]) - Inpatient Brief Assessment   Patient Details  Name: Dawn Lambert MRN: [id number] Date of Birth: June 04, 1990  Transition of Care [facility]) CM/SW Contact:    [name], LCSW Phone Number: 12/03/2023, 7:07 PM   Clinical Narrative:  SW spoke with mom Dawn Lambert regarding placement.  Dawn Lambert stated that she visit Dawn Lambert (Group home) today and has notified Dawn Lambert.  SW emailed and called Dawn Lambert regarding placement. SW also called [place]-owner and left message for Dawn Lambert   Transition of Care Asessment:

## 2023-12-03 NOTE — ED Notes (Signed)
 Assumed care of pt. Report received from previous RN. Pt alert and oriented. Pt RR even and unlabored. Pt denies pain at this time. Pt calm and cooperative. Pt denies any needs at this time.

## 2023-12-03 NOTE — ED Notes (Signed)
VOL/TOC pending placement

## 2023-12-03 NOTE — ED Notes (Signed)
 VOL/TOC STILL PENDING PLACEMENT
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
## 2023-12-03 NOTE — ED Notes (Signed)
 Pt Dinner provided at bedside

## 2023-12-03 NOTE — ED Notes (Signed)
 Patient given snack at the bedside. No acute needs at this time.

## 2023-12-04 DIAGNOSIS — F259 Schizoaffective disorder, unspecified: Secondary | ICD-10-CM | POA: Diagnosis not present

## 2023-12-04 NOTE — ED Notes (Signed)

## 2023-12-04 NOTE — ED Notes (Signed)
vol/toc placement.. 

## 2023-12-04 NOTE — ED Notes (Signed)
 Pt given dinner tray and beverage

## 2023-12-04 NOTE — ED Provider Notes (Signed)
 Emergency Medicine Observation Re-evaluation Note  Dawn Lambert is a 33 y.o. female, seen on rounds today.  No events overnight  Physical Exam  BP 121/70 (BP Location: Left Arm)   Pulse 63   Temp 98.8 F (37.1 C) (Oral)   Resp 17   Ht 1.575 m (5' 2)   Wt 128 kg   SpO2 100%   BMI 51.61 kg/m  Physical Exam No distress  ED Course / MDM  Reviewed TOC notes from yesterday  Plan  Current plan is for [facility] placement, attempting group home placement.    Arlander Charleston, MD 12/04/23 [phone]

## 2023-12-05 DIAGNOSIS — F259 Schizoaffective disorder, unspecified: Secondary | ICD-10-CM | POA: Diagnosis not present

## 2023-12-05 NOTE — ED Notes (Signed)
 Pt resting in bed with eyes closed. Pt is well appearing with RR even and unlabored.

## 2023-12-05 NOTE — ED Notes (Signed)
 Patient is in the dayroom at this time.

## 2023-12-05 NOTE — ED Notes (Signed)
 Snacks provided to pt

## 2023-12-05 NOTE — ED Notes (Signed)
VOL/TOC pending placement

## 2023-12-05 NOTE — ED Notes (Signed)
 Vol/ TOC pending placement
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
## 2023-12-05 NOTE — ED Notes (Signed)
 Assumed care of pt. Report received from previous RN. Pt alert and oriented. Pt RR even and unlabored. Pt denies pain at this time. Pt calm and cooperative. Pt denies any needs at this time.

## 2023-12-05 NOTE — Progress Notes (Signed)
   12/05/23 1445  Spiritual Encounters  Type of Visit Follow up  Care provided to: Patient  Conversation partners present during encounter Nurse  Reason for visit Routine spiritual support  OnCall Visit No   Chaplain visited with patient while on the Unit.  Patient shared that she's still waiting for a group home and wanted prayer about that.  Chaplain prayed for patient.    Rev. Rana M. Nicholaus, M.Div. Chaplain Resident [facility]

## 2023-12-05 NOTE — ED Notes (Signed)
 Dinner tray provided to pt

## 2023-12-05 NOTE — TOC CM/SW Note (Signed)
..  Transition of Care [facility]) - Inpatient Brief Assessment   Patient Details  Name: Dawn Lambert MRN: [id number] Date of Birth: Apr 07, 1991  Transition of Care [facility]) CM/SW Contact:    [name], LCSW Phone Number: 12/05/2023, 6:28 PM   Clinical Narrative:  SW reached out to [facility] at [phone]. SW left message for [employer].  SW email [employer] and [name] Franks with [facility] regarding [place] placement option.   Transition of Care Asessment:

## 2023-12-05 NOTE — ED Notes (Signed)
 Lunch tray provided to pt.

## 2023-12-05 NOTE — ED Notes (Addendum)
 RN spoke wit [name] in regards to placement for this pt. Per representative, placement is being worked on by team and will call with updates.

## 2023-12-05 NOTE — ED Notes (Signed)
 Patient had a snack at this time.

## 2023-12-05 NOTE — ED Notes (Signed)
Patient is using the phone at this time. 

## 2023-12-06 DIAGNOSIS — F259 Schizoaffective disorder, unspecified: Secondary | ICD-10-CM | POA: Diagnosis not present

## 2023-12-06 LAB — BASIC METABOLIC PANEL WITH GFR
Anion gap: 10 (ref 5–15)
BUN: 16 mg/dL (ref 6–20)
CO2: 27 mmol/L (ref 22–32)
Calcium: 9.7 mg/dL (ref 8.9–10.3)
Chloride: 102 mmol/L (ref 98–111)
Creatinine, Ser: 1.06 mg/dL — ABNORMAL HIGH (ref 0.44–1.00)
GFR, Estimated: >60 mL/min (ref >60)
Glucose, Bld: 149 mg/dL — ABNORMAL HIGH (ref 70–99)
Potassium: 3.7 mmol/L (ref 3.5–5.1)
Sodium: 139 mmol/L (ref 135–145)

## 2023-12-06 MED ORDER — HYDROXYZINE HCL 10 MG PO TABS
10.0000 mg | ORAL_TABLET | Freq: Once | ORAL | Status: AC
  Administered 2023-12-06: 10 mg via ORAL
  Filled 2023-12-06 (×2): qty 1

## 2023-12-06 NOTE — ED Notes (Signed)
 This RN attempted to draw patient's blood x2 but was unsuccessful. Lab called to obtain blood.

## 2023-12-06 NOTE — ED Notes (Addendum)
 Pt c/o feeling slightly depressed and anxious; MD aware.

## 2023-12-06 NOTE — ED Notes (Signed)
Patient given snack.  

## 2023-12-06 NOTE — ED Notes (Signed)
VOL/TOC pending placement

## 2023-12-06 NOTE — ED Notes (Signed)
VOL/ TOC Pending placement

## 2023-12-06 NOTE — ED Notes (Signed)
 Patient given dinner tray.
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
## 2023-12-06 NOTE — TOC CM/SW Note (Signed)
..  Transition of Care [facility]) - Inpatient Brief Assessment   Patient Details  Name: Dawn Lambert MRN: [id number] Date of Birth: 1991/03/18  Transition of Care [facility]) CM/SW Contact:    [name], LCSW Phone Number: 12/06/2023, 11:56 AM   Clinical Narrative:  SW spoke with [place].  The process is moving forward with placement at [facility].  [name] is requesting enhance rate due to pt elopements.  Enhance rate is only for 3 to 6 months which will allow facility time  to hire additional staff and learn pt.  Additional paperwork and enhance rate will need to be approved and enhance rate can take a couple weeks. Appointment scheduled to meet with [name] and other staff involved with pt next week via Teams.   SW received message from Grayce [name] at [phone].  Robin expressed that sw will need to follow up with parents or [name]  Transition of Care Asessment:

## 2023-12-06 NOTE — ED Notes (Signed)
 Patient repeatedly coming up to nurses station asking when she is going to a group home. This RN assured patient that we will let her know any updates we have but at this time we do not know when she will be leaving. This RN told patient that our team is working on placement at a group home and it could take some time before she is discharged.

## 2023-12-07 DIAGNOSIS — F259 Schizoaffective disorder, unspecified: Secondary | ICD-10-CM | POA: Diagnosis not present

## 2023-12-07 NOTE — ED Notes (Signed)
 VOL/TOC Placement

## 2023-12-07 NOTE — ED Notes (Signed)
 Dinner and beverage provided by staff

## 2023-12-07 NOTE — ED Notes (Addendum)
 Pt pacing in day room asking for this nurse to all her care manager to get an update. After calling number provided on chart, this nurse was informed pt is not to call anyone other than her mother for updates and is also not to be given phone numbers of people working on her plan of care. This nurse verbalized understanding and noted in chart.

## 2023-12-07 NOTE — ED Notes (Signed)
 Patient given snack. No acute needs at this time.

## 2023-12-08 DIAGNOSIS — F259 Schizoaffective disorder, unspecified: Secondary | ICD-10-CM | POA: Diagnosis not present

## 2023-12-08 MED ORDER — HYDROXYZINE HCL 10 MG PO TABS
10.0000 mg | ORAL_TABLET | Freq: Once | ORAL | Status: AC
  Administered 2023-12-08: 10 mg via ORAL
  Filled 2023-12-08: qty 1

## 2023-12-08 NOTE — ED Notes (Signed)
 Pt was provided a lunch tray

## 2023-12-08 NOTE — ED Provider Notes (Signed)
 [facility] Observation Note   ----------------------------------------- 9:45 AM on 12/08/2023 -----------------------------------------  Dawn Lambert is a 33 y.o. female currently boarding in the Emergency Department.  No acute events since last update.  Recent Vitals   Most recent vital signs: Vitals:   12/07/23 2203 12/08/23 0930  BP: (!) 147/76 (!) 149/71  Pulse: 65 64  Resp: 16 20  Temp: 98.8 F (37.1 C) 99.1 F (37.3 C)  SpO2: 99% 98%    ED Results / Procedures / Treatments   Labs (all labs ordered are listed, but only abnormal results are displayed) Labs Reviewed  COMPREHENSIVE METABOLIC PANEL WITH GFR - Abnormal; Notable for the following components:      Result Value   Glucose, Bld 242 (*)    Creatinine, Ser 1.03 (*)    All other components within normal limits  CBC - Abnormal; Notable for the following components:   RBC 3.52 (*)    Hemoglobin 10.7 (*)    HCT 33.2 (*)    All other components within normal limits  URINE DRUG SCREEN, QUALITATIVE ([facility] ONLY) - Abnormal; Notable for the following components:   Tricyclic, Ur Screen POSITIVE (*)    Benzodiazepine, Ur Scrn POSITIVE (*)    All other components within normal limits  BASIC METABOLIC PANEL WITH GFR - Abnormal; Notable for the following components:   Glucose, Bld 259 (*)    Creatinine, Ser 1.05 (*)    All other components within normal limits  BASIC METABOLIC PANEL WITH GFR - Abnormal; Notable for the following components:   Glucose, Bld 236 (*)    All other components within normal limits  URINALYSIS, COMPLETE (UACMP) WITH MICROSCOPIC - Abnormal; Notable for the following components:   Color, Urine STRAW (*)    APPearance CLEAR (*)    Specific Gravity, Urine 1.044 (*)    All other components within normal limits  BASIC METABOLIC PANEL WITH GFR - Abnormal; Notable for the following components:   Glucose, Bld 149 (*)    Creatinine, Ser 1.06 (*)    All other  components within normal limits  ETHANOL  LITHIUM  LEVEL  POC URINE PREG, ED    MEDICATIONS ORDERED IN ED: Medications  ibuprofen  (ADVIL ) tablet 600 mg (600 mg Oral Given 12/07/23 2001)  ondansetron  (ZOFRAN ) tablet 4 mg (4 mg Oral Given 11/17/23 1030)  alum & mag hydroxide-simeth (MAALOX/MYLANTA) 200-200-20 MG/5ML suspension 30 mL (30 mLs Oral Given 11/28/23 1913)  carbamazepine  (TEGRETOL ) tablet 400 mg (400 mg Oral Given 12/07/23 2111)  loratadine  (CLARITIN ) tablet 10 mg (10 mg Oral Given 12/07/23 1013)  cholecalciferol  (VITAMIN D3) 25 MCG (1000 UNIT) tablet 1,000 Units (1,000 Units Oral Given 12/07/23 1013)  clonazePAM  (KLONOPIN ) tablet 1 mg (1 mg Oral Given 12/07/23 2111)  gabapentin  (NEURONTIN ) capsule 800 mg (800 mg Oral Given 12/07/23 2111)  levothyroxine  (SYNTHROID ) tablet 175 mcg (175 mcg Oral Given 12/07/23 1012)  lisinopril  (ZESTRIL ) tablet 10 mg (10 mg Oral Given 12/07/23 1013)  multivitamin with minerals tablet 1 tablet (1 tablet Oral Given 12/07/23 1013)  propranolol  (INDERAL ) tablet 40 mg (40 mg Oral Given 12/07/23 2112)  haloperidol  (HALDOL ) tablet 5 mg (5 mg Oral Given 12/07/23 2112)  lithium  carbonate capsule 300 mg (300 mg Oral Given 12/07/23 1012)  lithium  carbonate capsule 600 mg (600 mg Oral Given 12/07/23 2111)  nystatin  (MYCOSTATIN /NYSTOP ) topical powder ( Topical Given 12/07/23 2111)  iohexol  (OMNIPAQUE ) 300 MG/ML solution 80 mL (80 mLs Intravenous Contrast Given 11/29/23 1541)  hydrOXYzine  (ATARAX ) tablet  10 mg (10 mg Oral Given 12/06/23 1542)     ED Plan   Currently awaiting placement into an appropriate living facility.  Social work is working with the patient to help achieve this.      Dorothyann Drivers, MD 12/08/23 [phone]

## 2023-12-08 NOTE — ED Notes (Signed)
Pt was provided breakfast at bedside.

## 2023-12-08 NOTE — ED Notes (Addendum)
 BP 149/71 this a.m. Per EDP Dr Dorothyann, give lisinopril  and hold propranolol  at this time

## 2023-12-08 NOTE — ED Notes (Signed)
Pt was provided a dinner tray.

## 2023-12-08 NOTE — ED Notes (Addendum)
 Vitals where completed.

## 2023-12-08 NOTE — ED Notes (Signed)
Snack was given. 

## 2023-12-09 DIAGNOSIS — F259 Schizoaffective disorder, unspecified: Secondary | ICD-10-CM | POA: Diagnosis not present

## 2023-12-09 MED ORDER — DOXYCYCLINE HYCLATE 100 MG PO TABS
100.0000 mg | ORAL_TABLET | Freq: Two times a day (BID) | ORAL | Status: AC
  Administered 2023-12-09 – 2023-12-16 (×20): 100 mg via ORAL
  Filled 2023-12-09 (×15): qty 1

## 2023-12-09 NOTE — ED Notes (Signed)
 Pt came out of the bathroom saying she was bleeding from the top of her bottom where she had said the cyst was. This RN confirmed that the area was slowly bleeding/draining. ED Provider made aware and is at bedside.

## 2023-12-09 NOTE — ED Notes (Signed)
  During nursing assessment Lashaun  was A/Ox 4 .  She  stated that she is not currently have thoughts or feelings of SI/HI.  PT reported that she is not currently having auditory or visual hallucinations.  Pt affect is appropriate , eye contact is steady, speech is clear  with normal verbiage noted.  Staff addressed any feelings or concerns that have been brought up.  Medications were administered as ordered. Continue to monitor patient as ordered for any changes in behaviors and for continued safety.

## 2023-12-09 NOTE — ED Notes (Signed)
 Patient is TOC pending placement

## 2023-12-09 NOTE — ED Notes (Signed)
Pt refused shower.  

## 2023-12-09 NOTE — TOC Progression Note (Signed)
 Transition of Care [facility]) - Progression Note    Patient Details  Name: Dawn Lambert MRN: [id number] Date of Birth: 1990/09/02  Transition of Care [facility]) CM/SW Contact  [place] L Corky [place]um[place], [name] Phone Number: 12/09/2023, 12:04 PM  Clinical Narrative:      CSW left a voicemail message requesting an update on the facility that the legal guardian toured on Monday.                    Expected Discharge Plan and Services                                               Social Drivers of Health (SDOH) Interventions SDOH Screenings   Food Insecurity: No Food Insecurity (07/23/2023)  Housing: Unknown (07/23/2023)  Transportation Needs: No Transportation Needs (07/23/2023)  Utilities: Not At Risk (07/23/2023)  Depression (PHQ2-9): Low Risk  (07/23/2023)  Tobacco Use: Low Risk  (11/10/2023)    Readmission Risk Interventions     No data to display

## 2023-12-09 NOTE — ED Notes (Signed)
 Pt made this RN aware that she thought she was getting a cyst on the top of her bottom. This RN looked and did not see or feel anything that looked cyst like. Pt was slightly swollen on the top right side of her bottom and it was tender to the touch but not red or hot. RN gave PRN ibuprofen  and made the ED provider aware.

## 2023-12-09 NOTE — ED Notes (Signed)
Lunch tray and drink provided to pt 

## 2023-12-09 NOTE — ED Notes (Signed)
 Hospital meal provided, pt tolerated w/o complaints.  Waste discarded appropriately.
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
## 2023-12-09 NOTE — ED Notes (Signed)
 This EDT gave pt one cup of water.

## 2023-12-09 NOTE — ED Provider Notes (Signed)
.-----------------------------------------   8:44 PM on 12/09/2023 -----------------------------------------  Blood pressure 128/62, pulse 61, temperature 98.3 F (36.8 C), temperature source Oral, resp. rate 16, height 5' 2 (1.575 m), weight 128 kg, SpO2 99%.  Was notified by nurse to assess patient because she was complaining about a cyst on her tailbone that had ruptured.  Patient states that there was purulent drainage and some blood.  No fever.  States the pain is improved.  On exam, there is no overlying erythema, no palpable fluctuance, there is dried blood where the cyst that ruptured.  Given lack of fluctuance and induration, no I&D necessary at this time.  Suspect the infected cyst had already drained.  Will give her a course of doxycycline .         Waymond Lorelle Cummins, MD 12/09/23 2045

## 2023-12-09 NOTE — ED Notes (Signed)
Pt provided with breakfast tray and juice  

## 2023-12-09 NOTE — ED Notes (Signed)
 This EDT gave the pt chocolate ice cream and grape juice as their snack.

## 2023-12-09 NOTE — ED Notes (Signed)
TOC placement 

## 2023-12-10 DIAGNOSIS — F259 Schizoaffective disorder, unspecified: Secondary | ICD-10-CM | POA: Diagnosis not present

## 2023-12-10 MED ORDER — ACETAMINOPHEN 500 MG PO TABS
1000.0000 mg | ORAL_TABLET | Freq: Three times a day (TID) | ORAL | Status: DC | PRN
  Administered 2023-12-10 – 2023-12-17 (×3): 1000 mg via ORAL
  Filled 2023-12-10 (×2): qty 2

## 2023-12-10 NOTE — ED Notes (Signed)
Snack was given. 

## 2023-12-10 NOTE — ED Notes (Signed)
 Dinner was given at the bedside.

## 2023-12-10 NOTE — ED Notes (Signed)
 Snack was given to the pt.

## 2023-12-10 NOTE — ED Notes (Signed)
 Pt given warm chicken broth due to continued nausea. Dinner tray from dining services not eaten.

## 2023-12-10 NOTE — ED Notes (Signed)
 Pt requesting PRN pain medication for pain relief in her back and buttocks. PRN medication given.

## 2023-12-10 NOTE — ED Notes (Signed)
 TOC Pending Placement

## 2023-12-10 NOTE — ED Notes (Signed)
 Pt c/o nausea. Medication provided per order.

## 2023-12-10 NOTE — ED Notes (Signed)
 Breakfast tray was provided at bedside.

## 2023-12-10 NOTE — ED Notes (Signed)
 Lunch tray was provided ay bedside.

## 2023-12-10 NOTE — ED Notes (Signed)
TOC pending placement

## 2023-12-10 NOTE — ED Notes (Signed)

## 2023-12-10 NOTE — ED Notes (Signed)
 Vitals are complete and pt had a shower this morning.
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
## 2023-12-10 NOTE — ED Notes (Signed)
 Pt given snack and drink at this time. Pt calm and cooperative.

## 2023-12-11 DIAGNOSIS — F259 Schizoaffective disorder, unspecified: Secondary | ICD-10-CM | POA: Diagnosis not present

## 2023-12-11 NOTE — ED Notes (Signed)
 Assumed care of patient, pt ambulatory in dayroom, no signs of distress noted

## 2023-12-11 NOTE — ED Notes (Signed)
TOC pending placement

## 2023-12-11 NOTE — ED Notes (Signed)
 Patient was given pm snack
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
## 2023-12-11 NOTE — TOC CM/SW Note (Signed)
..  Transition of Care [facility]) - Inpatient Brief Assessment   Patient Details  Name: Dawn Lambert MRN: [id number] Date of Birth: March 21, 1991  Transition of Care [facility]) CM/SW Contact:    [name], LCSW Phone Number: 12/11/2023, 10:06 AM   Clinical Narrative:  Placement pending.  Meeting scheduled with LME, Monday, August 18 to discuss pt.   Transition of Care Asessment:

## 2023-12-11 NOTE — ED Notes (Signed)
VOL/TOC Pending Placement

## 2023-12-11 NOTE — ED Notes (Signed)
 Hospital meal provided, pt tolerated w/o complaints.  Waste discarded appropriately.

## 2023-12-12 DIAGNOSIS — F259 Schizoaffective disorder, unspecified: Secondary | ICD-10-CM | POA: Diagnosis not present

## 2023-12-12 NOTE — ED Notes (Signed)
 Behavioral health staff speaking with patient currently in patient room

## 2023-12-12 NOTE — ED Notes (Signed)
 Patient received snack and water.

## 2023-12-12 NOTE — ED Notes (Signed)
 Breakfast tray and juice provided to patient.

## 2023-12-12 NOTE — ED Notes (Signed)
 TOC PENDING PLACEMENT

## 2023-12-12 NOTE — ED Notes (Signed)
 Assumed care of patient, pt resting with eyes shut, respirations even and unlabored, no distress noted

## 2023-12-12 NOTE — ED Notes (Signed)
 Dinner tray provided for pt

## 2023-12-12 NOTE — Group Note (Deleted)
 Date:  12/12/2023 Time:  2:22 PM  Group Topic/Focus:  Wellness Toolbox:   The focus of this group is to discuss various aspects of wellness, balancing those aspects and exploring ways to increase the ability to experience wellness.  Patients will create a wellness toolbox for use upon discharge.     Participation Level:  {BHH PARTICIPATION OZCZO:77735}  Participation Quality:  {BHH PARTICIPATION QUALITY:22265}  Affect:  {BHH AFFECT:22266}  Cognitive:  {BHH COGNITIVE:22267}  Insight: {BHH Insight2:20797}  Engagement in Group:  {BHH ENGAGEMENT IN HMNLE:77731}  Modes of Intervention:  {BHH MODES OF INTERVENTION:22269}  Additional Comments:  ***  Myra Curtistine BROCKS 12/12/2023, 2:22 PM
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
## 2023-12-12 NOTE — ED Notes (Signed)
TOC pending placement

## 2023-12-13 DIAGNOSIS — F259 Schizoaffective disorder, unspecified: Secondary | ICD-10-CM | POA: Diagnosis not present

## 2023-12-13 LAB — BASIC METABOLIC PANEL WITH GFR
Anion gap: 12 (ref 5–15)
BUN: 18 mg/dL (ref 6–20)
CO2: 26 mmol/L (ref 22–32)
Calcium: 9.6 mg/dL (ref 8.9–10.3)
Chloride: 100 mmol/L (ref 98–111)
Creatinine, Ser: 1 mg/dL (ref 0.44–1.00)
GFR, Estimated: >60 mL/min (ref >60)
Glucose, Bld: 229 mg/dL — ABNORMAL HIGH (ref 70–99)
Potassium: 3.9 mmol/L (ref 3.5–5.1)
Sodium: 138 mmol/L (ref 135–145)

## 2023-12-13 NOTE — ED Notes (Signed)
VOL/TOC Pending Placement
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
## 2023-12-13 NOTE — TOC Progression Note (Signed)
 Transition of Care [facility]) - Progression Note    Patient Details  Name: Dawn Lambert MRN: [id number] Date of Birth: June 29, 1990  Transition of Care [facility]) CM/SW Contact  [name], [name] Phone Number: 12/13/2023, 9:16 AM  Clinical Narrative:     CSW visited patient. Patient wanted information regarding her placement. She was aware of the meeting scheduled for 8/18 with the LME and [name].   Potential placement at [facility] pending. They are asking for an enhanced rate due to patients behaviors.   Patient was pleasant during visit. She seemed to receive the information well.                     Expected Discharge Plan and Services                                               Social Drivers of Health (SDOH) Interventions SDOH Screenings   Food Insecurity: No Food Insecurity (07/23/2023)  Housing: Unknown (07/23/2023)  Transportation Needs: No Transportation Needs (07/23/2023)  Utilities: Not At Risk (07/23/2023)  Depression (PHQ2-9): Low Risk  (07/23/2023)  Tobacco Use: Low Risk  (11/10/2023)    Readmission Risk Interventions     No data to display

## 2023-12-13 NOTE — TOC CM/SW Note (Addendum)
..  Transition of Care [facility]) - Inpatient Brief Assessment   Patient Details  Name: Dawn Lambert MRN: [id number] Date of Birth: 01-03-1991  Transition of Care [facility] CM/SW Contact:    [name], LCSW Phone Number: 12/13/2023, 9:57 AM   Clinical Narrative:  9:54am SW submitted message to [name] to see if authorization was submitted for enhanced rate.  Waiting on response.   10:02am Hi! Good morning! Everything has been submitted on my end. The placement is still pending until all information has been approved. I will inform you all as soon as she is approved for move in. Also, I never received a link for the meeting on Monday at 10:30am. Could someone send the link?[name].   SW resubmitted link to [place].   Transition of Care Asessment:

## 2023-12-13 NOTE — ED Notes (Signed)
 Hospital meal provided, pt tolerated w/o complaints.  Waste discarded appropriately.

## 2023-12-13 NOTE — ED Notes (Signed)
 Patient given a snack

## 2023-12-14 DIAGNOSIS — F259 Schizoaffective disorder, unspecified: Secondary | ICD-10-CM | POA: Diagnosis not present

## 2023-12-14 NOTE — ED Notes (Signed)
 Dinner tray provided to pt

## 2023-12-14 NOTE — ED Notes (Signed)
 Pt sitting in dayroom with other patients at this time

## 2023-12-14 NOTE — ED Notes (Signed)
 Snacks provided to pt
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
## 2023-12-14 NOTE — ED Notes (Signed)
 Report given to [employer].

## 2023-12-14 NOTE — ED Notes (Signed)
 Lunch tray provided to pt.

## 2023-12-14 NOTE — ED Notes (Signed)
VOL/TOC Placement Pending

## 2023-12-14 NOTE — Progress Notes (Signed)
   12/14/23 1515  Spiritual Encounters  Type of Visit Follow up  Care provided to: Patient  Conversation partners present during encounter Nurse  Reason for visit Routine spiritual support  OnCall Visit Yes   Chaplain spoke to patient while on the Unit.  Patient shared that she is looking forward to getting her Group Home.  She also shared that she's praying for another patient to also find a Group Home.    Rev. Rana M. Nicholaus, M.Div. Chaplain Resident [facility]

## 2023-12-15 DIAGNOSIS — F259 Schizoaffective disorder, unspecified: Secondary | ICD-10-CM | POA: Diagnosis not present

## 2023-12-15 NOTE — ED Notes (Signed)
 Report received, care of pt assumed.  Pt siting in room on bed, no c/o voiced at this time.
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
## 2023-12-15 NOTE — ED Notes (Signed)
 Patient ambulatory to bathroom at this time.  Provided with water per request.

## 2023-12-15 NOTE — ED Notes (Signed)
 Pt provided lunch tray at this time

## 2023-12-15 NOTE — ED Notes (Signed)
 Snacks provided.

## 2023-12-15 NOTE — ED Notes (Signed)
 Breakfast tray provided, pt in room at this time.

## 2023-12-15 NOTE — ED Notes (Signed)
 Morning snacks provided.

## 2023-12-15 NOTE — ED Notes (Signed)
 Pt provided shower supplies and clean linens.  Pt in shower at this time.

## 2023-12-15 NOTE — ED Notes (Signed)
 Patient is vol pending placement

## 2023-12-15 NOTE — ED Notes (Signed)
 Afternoon snack provided.

## 2023-12-15 NOTE — ED Notes (Addendum)
 Patient requesting PRN Motrin  for generalized pain.  Would like it given with nightly medications.

## 2023-12-16 DIAGNOSIS — F259 Schizoaffective disorder, unspecified: Secondary | ICD-10-CM | POA: Diagnosis not present

## 2023-12-16 NOTE — ED Notes (Signed)
 Patient given snack. No acute needs at this time.

## 2023-12-16 NOTE — ED Notes (Signed)
VOL/TOC Pending Placement

## 2023-12-16 NOTE — ED Notes (Signed)
 Lunch tray provided to pt, along with drink.
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
## 2023-12-16 NOTE — ED Notes (Signed)
 Hospital meal provided, pt tolerated w/o complaints.  Waste discarded appropriately.

## 2023-12-16 NOTE — ED Notes (Signed)
 Dinner tray provided to pt, along with a drink.

## 2023-12-16 NOTE — ED Notes (Signed)
Snack was provided.

## 2023-12-16 NOTE — ED Notes (Signed)
 Pt taking shower. Pt was given hygiene items and the following, 1 clean top, 1 clean bottom, with 1 pair of disposable underwear.  Pt changed out into clean clothing.  Staff disposed of all shower supplies.

## 2023-12-17 DIAGNOSIS — F259 Schizoaffective disorder, unspecified: Secondary | ICD-10-CM | POA: Diagnosis not present

## 2023-12-17 NOTE — [place] FL2 (Signed)
 [place]  MEDICAID FL2 LEVEL OF CARE FORM     IDENTIFICATION  Patient Name: Dawn Lambert Birthdate: Sep 02, 1990 Sex: female Admission Date (Current Location): 11/10/2023  [facility] and [place] Number:  Chiropodist and Address:  [facility], [address] [place], [name] [address]      Provider Number: [id number]  Attending Physician Name and Address:  No att. 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   Oppositional defiant disorder 09/27/2023   Homicidal ideations 09/27/2023   Generalized anxiety disorder 01/08/2023   Episodic mood disorder (HCC) 01/08/2023   Snoring 12/04/2022   Witnessed episode of apnea 12/04/2022   Sepsis without acute organ dysfunction (HCC) 10/10/2022   Acute gastroenteritis 10/09/2022   Morbid obesity with BMI of 50.0-59.9, adult (HCC) 10/09/2022   Urinary tract infection 10/09/2022   AKI (acute kidney injury) (HCC) 10/09/2022   Hypertension 10/09/2022   Uncontrolled type 2 diabetes mellitus with hyperglycemia, without long-term current use of insulin  (HCC) 10/09/2022   Hypothyroidism 10/09/2022   Joint pain 10/09/2022   Behavior concern    Schizoaffective disorder (HCC)    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   Suicidal ideation 09/19/2020   Intellectual disability 10/12/2017   Umbilical hernia without obstruction and without gangrene 08/15/2017   Chest pain 11/05/2012   Atrial septal defect 09/19/2011    Orientation RESPIRATION BLADDER Height &  Weight     Self, Time, Situation, Place  Normal Continent Weight: 128 kg Height:  5' 2 (157.5 cm)  BEHAVIORAL SYMPTOMS/MOOD NEUROLOGICAL BOWEL NUTRITION STATUS      Continent Diet  AMBULATORY STATUS COMMUNICATION OF NEEDS Skin   Independent Verbally Normal                       Personal Care Assistance Level of Assistance  Bathing, Feeding, Dressing Bathing Assistance: Independent Feeding assistance: Independent Dressing Assistance: Independent     Functional Limitations Info  Sight, Hearing, Speech Sight Info: Adequate Hearing Info: Adequate      SPECIAL CARE FACTORS FREQUENCY                       Contractures Contractures Info: Not present    Additional Factors Info  Code Status, Allergies Code Status Info: Full Code Allergies Info: Amoxicillin           Current Medications (12/17/2023):  This is the current hospital active medication list Current Facility-Administered Medications  Medication Dose Route Frequency Provider Last Rate Last Admin   acetaminophen  (TYLENOL ) tablet 1,000 mg  1,000 mg Oral Q8H PRN Dicky Anes, MD   1,000 mg at 12/17/23 0158   alum & mag hydroxide-simeth (MAALOX/MYLANTA) 200-200-20 MG/5ML suspension 30 mL  30 mL Oral Q6H PRN Viviann Pastor, MD   30 mL at 12/12/23 1835   carbamazepine  (TEGRETOL ) tablet 400 mg  400 mg Oral BID Viviann Pastor, MD   400 mg at 12/17/23 9072   cholecalciferol  (VITAMIN D3) 25 MCG (1000 UNIT) tablet 1,000 Units  1,000 Units Oral Daily Viviann Pastor,  MD   1,000 Units at 12/17/23 0931   clonazePAM  (KLONOPIN ) tablet 1 mg  1 mg Oral BID Viviann Pastor, MD   1 mg at 12/17/23 9070   gabapentin  (NEURONTIN ) capsule 800 mg  800 mg Oral TID Viviann Pastor, MD   800 mg at 12/17/23 9070   haloperidol  (HALDOL ) tablet 5 mg  5 mg Oral QHS Viviann Pastor, MD   5 mg at 12/16/23 2153   ibuprofen  (ADVIL ) tablet 600 mg  600 mg Oral Q8H PRN Viviann Pastor, MD   600 mg at 12/15/23 2119   levothyroxine   (SYNTHROID ) tablet 175 mcg  175 mcg Oral QAC breakfast Viviann Pastor, MD   175 mcg at 12/17/23 0725   lisinopril  (ZESTRIL ) tablet 10 mg  10 mg Oral Daily Viviann Pastor, MD   10 mg at 12/17/23 9071   lithium  carbonate capsule 300 mg  300 mg Oral q AM Lenon Elsie HERO, RPH   300 mg at 12/17/23 0932   lithium  carbonate capsule 600 mg  600 mg Oral QHS Lenon Elsie HERO, RPH   600 mg at 12/16/23 2153   loratadine  (CLARITIN ) tablet 10 mg  10 mg Oral Daily Viviann Pastor, MD   10 mg at 12/17/23 9068   multivitamin with minerals tablet 1 tablet  1 tablet Oral Daily Viviann Pastor, MD   1 tablet at 12/17/23 9071   nystatin  (MYCOSTATIN /NYSTOP ) topical powder   Topical BID Jessup, Charles, MD   Given at 12/17/23 9061   ondansetron  (ZOFRAN ) tablet 4 mg  4 mg Oral Q8H PRN Viviann Pastor, MD   4 mg at 12/10/23 1459   propranolol  (INDERAL ) tablet 40 mg  40 mg Oral BID Viviann Pastor, MD   40 mg at 12/17/23 9068   Current Outpatient Medications  Medication Sig Dispense Refill   acetaminophen  (TYLENOL ) 325 MG tablet Take 650 mg by mouth every 6 (six) hours as needed for mild pain or moderate pain.     albuterol  (VENTOLIN  HFA) 108 (90 Base) MCG/ACT inhaler Inhale 2 puffs into the lungs every 4 (four) hours as needed.     bismuth subsalicylate (PEPTO BISMOL) 262 MG/15ML suspension SMARTSIG:30 Milliliter(s) By Mouth 4 Times Daily PRN     carbamazepine  (TEGRETOL ) 200 MG tablet Take 400 mg by mouth 2 (two) times daily. With food     cetirizine (ZYRTEC) 10 MG tablet Take 10 mg by mouth daily as needed for allergies.     cholecalciferol  (VITAMIN D ) 1000 units tablet Take 1,000 Units by mouth daily.      clonazePAM  (KLONOPIN ) 1 MG tablet Take 1 mg by mouth 2 (two) times daily.     Fluocinolone  Acetonide Scalp 0.01 % OIL Apply to aa's scalp QD on Monday, Wednesday and Friday x 6 weeks. After 6 wks decrease to QW on Friday. May increase to QD on Monday, Wednesday, and Friday during times of flares.  118.28 mL 11   fluticasone  (FLONASE ) 50 MCG/ACT nasal spray Place 1 spray into both nostrils daily as needed for allergies or rhinitis.     gabapentin  (NEURONTIN ) 800 MG tablet Take 800 mg by mouth 3 (three) times daily.     Guaifenesin  1200 MG TB12 Take 1,200 mg by mouth daily as needed (Congestion).     ibuprofen  (ADVIL ) 600 MG tablet Take 600 mg by mouth every 6 (six) hours as needed.     INVEGA  TRINZA 819 MG/2.63ML injection Inject 819 mg into the muscle every 3 (three) months.     levothyroxine  (SYNTHROID , LEVOTHROID)  175 MCG tablet Take 175 mcg by mouth daily before breakfast.     lidocaine  (XYLOCAINE ) 2 % solution Use as directed 15 mLs in the mouth or throat as needed for mouth pain.     lisinopril  (ZESTRIL ) 10 MG tablet Take 10 mg by mouth daily.     lithium  carbonate 300 MG capsule Take 300-600 mg by mouth 2 (two) times daily. Take 300 mg by mouth every morning 600 mg At bedtime     LORazepam  ER (LOREEV XR ) 2 MG CS24 Take 2 mg by mouth in the morning.     magnesium  hydroxide (MILK OF MAGNESIA) 400 MG/5ML suspension Take 30 mLs by mouth at bedtime as needed for mild constipation.     meloxicam  (MOBIC ) 15 MG tablet Take 1 tablet by mouth daily.     Multiple Vitamin (THEREMS PO) Take 1 tablet by mouth daily. Take with food     nystatin  powder Apply 1 Application topically 2 (two) times daily as needed (fungal infection). Apply under breast     propranolol  (INDERAL ) 40 MG tablet Take 40 mg by mouth 2 (two) times daily.     TRULICITY  4.5 MG/0.5ML SOAJ Inject 4.5 mg into the skin once a week. Wednesday     medroxyPROGESTERone (DEPO-PROVERA) 150 MG/ML injection Inject into the muscle. (Patient not taking: Reported on 09/27/2023)     ondansetron  (ZOFRAN -ODT) 8 MG disintegrating tablet Take 8 mg by mouth every 8 (eight) hours as needed. (Patient not taking: Reported on 09/27/2023)       Discharge Medications: Please see discharge summary for a list of discharge medications.  Relevant Imaging  Results:  Relevant Lab Results:   Additional Information    Delphine KANDICE Bring, RN

## 2023-12-17 NOTE — ED Notes (Signed)
 Pt has been calm and cooperative today during my shift.  She ate both her meals independently and took a shower and changed her scrubs.  Pt called her mother and was told about getting a spot at [facility] which seems to make pt very happy.
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
## 2023-12-17 NOTE — ED Notes (Signed)
 Assumed care of patient, pt resting in bed with eyes shut, respirations even and unlabored, no distress noted

## 2023-12-17 NOTE — ED Notes (Signed)
 Patient given cup of cold water by security per request.

## 2023-12-17 NOTE — ED Notes (Signed)
 Pt provided with lunch tray.
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
## 2023-12-17 NOTE — TOC CM/SW Note (Addendum)
..  Transition of Care [facility]) - Inpatient Brief Assessment   Patient Details  Name: Dawn Lambert MRN: [id number] Date of Birth: 06-06-1990  Transition of Care [facility]) CM/SW Contact:    [name], LCSW Phone Number: 12/17/2023, 1:58 PM   Clinical Narrative:   10:30- Meeting was help with LME and hospital staff.  Pt is expected to discharge to group home, Wednesday, August 20.  Group home is requesting updated FL2 with medication List.   1:55pm- FL2 updated. SW faxed FL2 to group home-[facility].  Information below was provided by group home   Group Home Contact [facility] [phone] (Cell) Fax: [phone]  Group Home Address [address] [place], [name] [address]  Pharmacy Group Home with use [facility] [phone]    Transition of Care Asessment:

## 2023-12-17 NOTE — ED Notes (Signed)
 Pt had vitals taken, pt also given snack.

## 2023-12-17 NOTE — ED Notes (Signed)
 Patient received dinner at this time.
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
## 2023-12-17 NOTE — ED Notes (Addendum)
Pt provided with a snack and water

## 2023-12-17 NOTE — TOC CM/SW Note (Signed)
..  Transition of Care [facility]) - Inpatient Brief Assessment   Patient Details  Name: Dawn Lambert MRN: [id number] Date of Birth: 1991/03/29  Transition of Care [facility]) CM/SW Contact:    [name], LCSW Phone Number: 12/17/2023, 5:29 PM   Clinical Narrative:  SW spoke with Tammie.  Pharmacy needs a wet signature.  They will not take FL2. SW reached out to Dr. Ernest to have medications sent to [facility] [phone].   Transition of Care Asessment:

## 2023-12-18 DIAGNOSIS — F259 Schizoaffective disorder, unspecified: Secondary | ICD-10-CM | POA: Diagnosis not present

## 2023-12-18 MED ORDER — LISINOPRIL 10 MG PO TABS: 10.0000 mg | ORAL_TABLET | Freq: Every day | ORAL | 0 refills | Status: DC

## 2023-12-18 MED ORDER — PROPRANOLOL HCL 40 MG PO TABS: 40.0000 mg | ORAL_TABLET | Freq: Two times a day (BID) | ORAL | 1 refills | Status: DC

## 2023-12-18 MED ORDER — LORATADINE 10 MG PO TABS: 10.0000 mg | ORAL_TABLET | Freq: Every day | ORAL | 1 refills | Status: AC

## 2023-12-18 MED ORDER — ALBUTEROL SULFATE HFA 108 (90 BASE) MCG/ACT IN AERS: 2.0000 | INHALATION_SPRAY | Freq: Four times a day (QID) | RESPIRATORY_TRACT | 0 refills | Status: AC | PRN

## 2023-12-18 MED ORDER — CARBAMAZEPINE ER 200 MG PO TB12: 200.0000 mg | ORAL_TABLET | Freq: Two times a day (BID) | ORAL | 1 refills | Status: AC

## 2023-12-18 MED ORDER — ONDANSETRON 4 MG PO TBDP: 4.0000 mg | ORAL_TABLET | Freq: Three times a day (TID) | ORAL | 0 refills | Status: DC | PRN

## 2023-12-18 MED ORDER — LITHIUM CARBONATE 300 MG PO TABS: 600.0000 mg | ORAL_TABLET | Freq: Every day | ORAL | 1 refills | Status: DC

## 2023-12-18 MED ORDER — NYSTATIN 100000 UNIT/GM EX POWD: 1.0000 | Freq: Two times a day (BID) | CUTANEOUS | 0 refills | Status: AC

## 2023-12-18 MED ORDER — CLONAZEPAM 1 MG PO TABS: 1.0000 mg | ORAL_TABLET | Freq: Two times a day (BID) | ORAL | 1 refills | Status: AC

## 2023-12-18 MED ORDER — LITHIUM CARBONATE 300 MG PO TABS: 300.0000 mg | ORAL_TABLET | Freq: Every morning | ORAL | 1 refills | Status: DC

## 2023-12-18 MED ORDER — GABAPENTIN 800 MG PO TABS: 800.0000 mg | ORAL_TABLET | Freq: Three times a day (TID) | ORAL | 1 refills | Status: AC

## 2023-12-18 MED ORDER — HALOPERIDOL 5 MG PO TABS: 5.0000 mg | ORAL_TABLET | Freq: Every day | ORAL | 1 refills | Status: AC

## 2023-12-18 MED ORDER — LEVOTHYROXINE SODIUM 175 MCG PO TABS: 175.0000 ug | ORAL_TABLET | Freq: Every day | ORAL | 1 refills | Status: AC

## 2023-12-18 NOTE — TOC CM/SW Note (Signed)
..  Transition of Care [facility]) - Inpatient Brief Assessment   Patient Details  Name: Dawn Lambert MRN: [id number] Date of Birth: 1991-04-01  Transition of Care [facility]) CM/SW Contact:    [name], LCSW Phone Number: 12/18/2023, 1:32 PM   Clinical Narrative:  SW reached out to Tammie with group home regarding update with meds a PCP.  PCP will be with [facility]-Dr. Jimmye and medications have been picked up by group home.  Tammie will be picking up pt tomorrow at 2pm  Transition of Care Asessment:
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
## 2023-12-18 NOTE — ED Notes (Signed)
Snacks given to pt.

## 2023-12-18 NOTE — ED Notes (Signed)
 VOL/TOC Placement

## 2023-12-18 NOTE — ED Notes (Signed)
Patient up to the restroom.

## 2023-12-18 NOTE — Progress Notes (Signed)
   12/18/23 1045  Spiritual Encounters  Type of Visit Follow up  Care provided to: Patient  Conversation partners present during encounter Nurse  Reason for visit Routine spiritual support  OnCall Visit Yes   Chaplain visited with patient and celebrated the patient sharing she's being discharged soon because placement was found.  Chaplain asked patient what she's most excited about and she will look forward to making new friends and going to programs to see dancers and eating concessions.  Chaplain asked patient if she can pray for her and the patient said, YES.  Chaplain prayed with patient.    Rev. Rana M. Nicholaus, M.Div. Chaplain Resident [facility]

## 2023-12-18 NOTE — ED Notes (Signed)
 Assumed care of patient, pt awake and walking around in dayroom, no distress noted

## 2023-12-18 NOTE — TOC Progression Note (Signed)
 Transition of Care [facility]) - Progression Note    Patient Details  Name: Dawn Lambert MRN: [id number] Date of Birth: [birth year]  Transition of Care [facility]) CM/SW Contact  Delphine KANDICE Bring, RN Phone Number: 12/18/2023, 8:06 AM  Clinical Narrative:    CM received an email from [facility] from [place]. She is requesting a new FL2 with patient's medications. CM called  Ms. Rivers  She states that  once they have the FL2 with her medication, plan to pick up patient on Wednesday 8/20 at 2pm.  CM reached out to ED provider, Dr. Ernest and update on  d/c date on Wednesday but need new FL 2 sign. CM asked  it patient will be d/c on her current medications.  Dr. Ernest states yes. New FL2 completed for MD to sign. CM updated ED SW.                    Expected Discharge Plan and Services                                               Social Drivers of Health (SDOH) Interventions SDOH Screenings   Food Insecurity: No Food Insecurity (07/23/2023)  Housing: Unknown (07/23/2023)  Transportation Needs: No Transportation Needs (07/23/2023)  Utilities: Not At Risk (07/23/2023)  Depression (PHQ2-9): Low Risk  (07/23/2023)  Tobacco Use: Low Risk  (11/10/2023)    Readmission Risk Interventions     No data to display

## 2023-12-18 NOTE — ED Notes (Addendum)
 Patient done using restroom and back in room. Patient given a cup of water per request.

## 2023-12-18 NOTE — ED Notes (Signed)
 Lunch tray provided to pt.

## 2023-12-18 NOTE — ED Notes (Signed)
 Pt requested shower; provided clean hospital clothing. Sower setup provided with soap shampoo, toothbrush/toothpaste, and deodorant. Pt able to perform own ADL's with no assistance.

## 2023-12-18 NOTE — ED Notes (Signed)
VOL/TOC Pending Placement

## 2023-12-18 NOTE — ED Notes (Signed)
 Hospital meal provided, pt tolerated w/o complaints.  Waste discarded appropriately.

## 2023-12-19 DIAGNOSIS — F259 Schizoaffective disorder, unspecified: Secondary | ICD-10-CM | POA: Diagnosis not present

## 2023-12-19 NOTE — ED Notes (Signed)
 Assumed care of patient, pt awake and in dayroom. No distress noted

## 2023-12-19 NOTE — ED Notes (Signed)
 Pt given snack.

## 2023-12-19 NOTE — ED Notes (Signed)
 Pt given water

## 2023-12-19 NOTE — ED Notes (Signed)
 Patient discharged to new group home, [name] with group home picked up patient and personal belongings given to [name] as well as a packet from behavioral health counselor/SW. Pt ambulatory to lobby.
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
## 2023-12-19 NOTE — ED Notes (Signed)
 Pt given lunch

## 2023-12-19 NOTE — ED Notes (Signed)
 Pt ambulated to and from bathroom, no assistance required.

## 2023-12-19 NOTE — ED Notes (Signed)
 Patient being picked up today

## 2023-12-19 NOTE — ED Notes (Addendum)
 Pt to and from bathroom at this time.

## 2023-12-19 NOTE — ED Notes (Signed)
 VOL/TOC Placement

## 2024-01-23 ENCOUNTER — Other Ambulatory Visit

## 2024-01-23 ENCOUNTER — Ambulatory Visit: Admitting: Oncology

## 2024-01-30 ENCOUNTER — Other Ambulatory Visit: Payer: Self-pay

## 2024-01-30 ENCOUNTER — Emergency Department

## 2024-01-30 ENCOUNTER — Emergency Department
Admission: EM | Admit: 2024-01-30 | Discharge: 2024-01-30 | Disposition: A | Discharge status: Home / Self Care | Source: Home / Self Care | Attending: Emergency Medicine | Admitting: Emergency Medicine

## 2024-01-30 DIAGNOSIS — W228XXA Striking against or struck by other objects, initial encounter: Secondary | ICD-10-CM | POA: Insufficient documentation

## 2024-01-30 DIAGNOSIS — E119 Type 2 diabetes mellitus without complications: Secondary | ICD-10-CM | POA: Insufficient documentation

## 2024-01-30 DIAGNOSIS — I10 Essential (primary) hypertension: Secondary | ICD-10-CM | POA: Insufficient documentation

## 2024-01-30 DIAGNOSIS — S0101XA Laceration without foreign body of scalp, initial encounter: Secondary | ICD-10-CM | POA: Insufficient documentation

## 2024-01-30 DIAGNOSIS — E039 Hypothyroidism, unspecified: Secondary | ICD-10-CM | POA: Insufficient documentation

## 2024-01-30 DIAGNOSIS — N179 Acute kidney failure, unspecified: Secondary | ICD-10-CM | POA: Diagnosis not present

## 2024-01-30 DIAGNOSIS — S0003XA Contusion of scalp, initial encounter: Secondary | ICD-10-CM

## 2024-01-30 DIAGNOSIS — T43591A Poisoning by other antipsychotics and neuroleptics, accidental (unintentional), initial encounter: Secondary | ICD-10-CM | POA: Diagnosis not present

## 2024-01-30 LAB — CBG MONITORING, ED: Glucose-Capillary: 173 mg/dL — ABNORMAL HIGH (ref 70–99)

## 2024-01-30 MED ORDER — LIDOCAINE-EPINEPHRINE-TETRACAINE (LET) TOPICAL GEL
3.0000 mL | Freq: Once | TOPICAL | Status: AC
  Administered 2024-01-30: 3 mL via TOPICAL
  Filled 2024-01-30: qty 3
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
## 2024-01-30 NOTE — ED Notes (Signed)
 Pt discharged at this time. RN reviewed discharge instructions with pt and group home. Pt verbalized understanding. Vital signs taken. RR even and unlabored. Pt denies any questions or needs at this time. Pt wheeled at discharge. Group home rep denies any questions or needs at this time.

## 2024-01-30 NOTE — ED Notes (Signed)
 Pts CBG was 173.

## 2024-01-30 NOTE — ED Notes (Signed)
 Patient transported to CT

## 2024-01-30 NOTE — ED Triage Notes (Signed)
 Pt arrives via [facility] from a group home. Pt had an episode (normal for her) where she throws her head back and forth. This morning she hit a shelf when she did this. No LOC, no blood thinners. Pt has behavioral problems and doesn't like her group home. Dried blood to hair on arrival. Pt alert on arrival.

## 2024-01-30 NOTE — Discharge Instructions (Addendum)
 Follow-up with your primary care provider or urgent care for staple removal in 7 days.  Keep the area clean and dry.  You may wash your hair with your regular shampoo and then dry.  Be careful with any comb or brush that you may apply to your hair as it may be caught in the staples.  Watch for signs of infection.  Continue with your regular medications.

## 2024-02-02 ENCOUNTER — Inpatient Hospital Stay: Admission: EM | Admit: 2024-02-02 | Discharge: 2024-02-06 | DRG: 917 | Attending: Hospitalist | Admitting: Hospitalist

## 2024-02-02 ENCOUNTER — Emergency Department

## 2024-02-02 ENCOUNTER — Other Ambulatory Visit: Payer: Self-pay

## 2024-02-02 DIAGNOSIS — N179 Acute kidney failure, unspecified: Secondary | ICD-10-CM | POA: Diagnosis present

## 2024-02-02 DIAGNOSIS — Z23 Encounter for immunization: Secondary | ICD-10-CM

## 2024-02-02 DIAGNOSIS — Z79890 Hormone replacement therapy (postmenopausal): Secondary | ICD-10-CM | POA: Diagnosis not present

## 2024-02-02 DIAGNOSIS — F411 Generalized anxiety disorder: Secondary | ICD-10-CM | POA: Diagnosis present

## 2024-02-02 DIAGNOSIS — F32A Depression, unspecified: Secondary | ICD-10-CM | POA: Diagnosis present

## 2024-02-02 DIAGNOSIS — F79 Unspecified intellectual disabilities: Secondary | ICD-10-CM | POA: Diagnosis present

## 2024-02-02 DIAGNOSIS — W19XXXA Unspecified fall, initial encounter: Secondary | ICD-10-CM | POA: Diagnosis present

## 2024-02-02 DIAGNOSIS — R300 Dysuria: Secondary | ICD-10-CM | POA: Diagnosis present

## 2024-02-02 DIAGNOSIS — E860 Dehydration: Secondary | ICD-10-CM | POA: Diagnosis present

## 2024-02-02 DIAGNOSIS — T56891A Toxic effect of other metals, accidental (unintentional), initial encounter: Secondary | ICD-10-CM

## 2024-02-02 DIAGNOSIS — F603 Borderline personality disorder: Secondary | ICD-10-CM | POA: Diagnosis present

## 2024-02-02 DIAGNOSIS — Z1152 Encounter for screening for COVID-19: Secondary | ICD-10-CM

## 2024-02-02 DIAGNOSIS — E876 Hypokalemia: Secondary | ICD-10-CM | POA: Diagnosis present

## 2024-02-02 DIAGNOSIS — Z79899 Other long term (current) drug therapy: Secondary | ICD-10-CM

## 2024-02-02 DIAGNOSIS — S0101XA Laceration without foreign body of scalp, initial encounter: Secondary | ICD-10-CM | POA: Diagnosis present

## 2024-02-02 DIAGNOSIS — I10 Essential (primary) hypertension: Secondary | ICD-10-CM | POA: Diagnosis present

## 2024-02-02 DIAGNOSIS — Z6841 Body Mass Index (BMI) 40.0 and over, adult: Secondary | ICD-10-CM | POA: Diagnosis not present

## 2024-02-02 DIAGNOSIS — Z7985 Long-term (current) use of injectable non-insulin antidiabetic drugs: Secondary | ICD-10-CM | POA: Diagnosis not present

## 2024-02-02 DIAGNOSIS — E66813 Obesity, class 3: Secondary | ICD-10-CM | POA: Diagnosis present

## 2024-02-02 DIAGNOSIS — E1165 Type 2 diabetes mellitus with hyperglycemia: Secondary | ICD-10-CM | POA: Diagnosis present

## 2024-02-02 DIAGNOSIS — T43591A Poisoning by other antipsychotics and neuroleptics, accidental (unintentional), initial encounter: Principal | ICD-10-CM | POA: Diagnosis present

## 2024-02-02 DIAGNOSIS — Z881 Allergy status to other antibiotic agents status: Secondary | ICD-10-CM | POA: Diagnosis not present

## 2024-02-02 DIAGNOSIS — E039 Hypothyroidism, unspecified: Secondary | ICD-10-CM | POA: Diagnosis present

## 2024-02-02 DIAGNOSIS — F259 Schizoaffective disorder, unspecified: Secondary | ICD-10-CM | POA: Diagnosis present

## 2024-02-02 DIAGNOSIS — R571 Hypovolemic shock: Secondary | ICD-10-CM | POA: Diagnosis present

## 2024-02-02 DIAGNOSIS — M6282 Rhabdomyolysis: Secondary | ICD-10-CM | POA: Diagnosis present

## 2024-02-02 DIAGNOSIS — Z794 Long term (current) use of insulin: Secondary | ICD-10-CM | POA: Diagnosis not present

## 2024-02-02 DIAGNOSIS — T43595A Adverse effect of other antipsychotics and neuroleptics, initial encounter: Secondary | ICD-10-CM | POA: Diagnosis present

## 2024-02-02 LAB — CBC WITH DIFFERENTIAL/PLATELET
Abs Immature Granulocytes: 0.12 K/uL — ABNORMAL HIGH (ref 0.00–0.07)
Basophils Absolute: 0 K/uL (ref 0.0–0.1)
Basophils Relative: 0 %
Eosinophils Absolute: 0.2 K/uL (ref 0.0–0.5)
Eosinophils Relative: 1 %
HCT: 34 % — ABNORMAL LOW (ref 36.0–46.0)
Hemoglobin: 10.7 g/dL — ABNORMAL LOW (ref 12.0–15.0)
Immature Granulocytes: 1 %
Lymphocytes Relative: 8 %
Lymphs Abs: 1.2 K/uL (ref 0.7–4.0)
MCH: 29.5 pg (ref 26.0–34.0)
MCHC: 31.5 g/dL (ref 30.0–36.0)
MCV: 93.7 fL (ref 80.0–100.0)
Monocytes Absolute: 0.8 K/uL (ref 0.1–1.0)
Monocytes Relative: 5 %
Neutro Abs: 12.6 K/uL — ABNORMAL HIGH (ref 1.7–7.7)
Neutrophils Relative %: 85 %
Platelets: 278 K/uL (ref 150–400)
RBC: 3.63 MIL/uL — ABNORMAL LOW (ref 3.87–5.11)
RDW: 12.7 % (ref 11.5–15.5)
WBC: 14.9 K/uL — ABNORMAL HIGH (ref 4.0–10.5)
nRBC: 0 % (ref 0.0–0.2)

## 2024-02-02 LAB — BLOOD GAS, VENOUS
Acid-Base Excess: 1.7 mmol/L (ref 0.0–2.0)
Bicarbonate: 28.2 mmol/L — ABNORMAL HIGH (ref 20.0–28.0)
O2 Saturation: 54.4 %
Patient temperature: 37
pCO2, Ven: 51 mmHg (ref 44–60)
pH, Ven: 7.35 (ref 7.25–7.43)
pO2, Ven: 33 mmHg (ref 32–45)

## 2024-02-02 LAB — GLUCOSE, CAPILLARY
Glucose-Capillary: 138 mg/dL — ABNORMAL HIGH (ref 70–99)
Glucose-Capillary: 151 mg/dL — ABNORMAL HIGH (ref 70–99)

## 2024-02-02 LAB — CK: Total CK: 1830 U/L — ABNORMAL HIGH (ref 38–234)

## 2024-02-02 LAB — URINE DRUG SCREEN, QUALITATIVE (ARMC ONLY)
Amphetamines, Ur Screen: NOT DETECTED
Barbiturates, Ur Screen: NOT DETECTED
Benzodiazepine, Ur Scrn: POSITIVE — AB
Cannabinoid 50 Ng, Ur ~~LOC~~: NOT DETECTED
Cocaine Metabolite,Ur ~~LOC~~: NOT DETECTED
MDMA (Ecstasy)Ur Screen: NOT DETECTED
Methadone Scn, Ur: NOT DETECTED
Opiate, Ur Screen: NOT DETECTED
Phencyclidine (PCP) Ur S: NOT DETECTED
Tricyclic, Ur Screen: POSITIVE — AB

## 2024-02-02 LAB — COMPREHENSIVE METABOLIC PANEL WITH GFR
ALT: 22 U/L (ref 0–44)
AST: 55 U/L — ABNORMAL HIGH (ref 15–41)
Albumin: 3 g/dL — ABNORMAL LOW (ref 3.5–5.0)
Alkaline Phosphatase: 76 U/L (ref 38–126)
Anion gap: 14 (ref 5–15)
BUN: 25 mg/dL — ABNORMAL HIGH (ref 6–20)
CO2: 26 mmol/L (ref 22–32)
Calcium: 9.1 mg/dL (ref 8.9–10.3)
Chloride: 100 mmol/L (ref 98–111)
Creatinine, Ser: 2.91 mg/dL — ABNORMAL HIGH (ref 0.44–1.00)
GFR, Estimated: 21 mL/min — ABNORMAL LOW (ref >60)
Glucose, Bld: 159 mg/dL — ABNORMAL HIGH (ref 70–99)
Potassium: 3 mmol/L — ABNORMAL LOW (ref 3.5–5.1)
Sodium: 140 mmol/L (ref 135–145)
Total Bilirubin: 0.9 mg/dL (ref 0.0–1.2)
Total Protein: 6.6 g/dL (ref 6.5–8.1)

## 2024-02-02 LAB — URINALYSIS, ROUTINE W REFLEX MICROSCOPIC
Bilirubin Urine: NEGATIVE
Glucose, UA: NEGATIVE mg/dL
Hgb urine dipstick: NEGATIVE
Ketones, ur: NEGATIVE mg/dL
Leukocytes,Ua: NEGATIVE
Nitrite: NEGATIVE
Protein, ur: 30 mg/dL — AB
Specific Gravity, Urine: 1.014 (ref 1.005–1.030)
pH: 5 (ref 5.0–8.0)

## 2024-02-02 LAB — MAGNESIUM: Magnesium: 2.3 mg/dL (ref 1.7–2.4)

## 2024-02-02 LAB — RESP PANEL BY RT-PCR (RSV, FLU A&B, COVID)  RVPGX2
Influenza A by PCR: NEGATIVE
Influenza B by PCR: NEGATIVE
Resp Syncytial Virus by PCR: NEGATIVE
SARS Coronavirus 2 by RT PCR: NEGATIVE

## 2024-02-02 LAB — TROPONIN I (HIGH SENSITIVITY)
Troponin I (High Sensitivity): 12 ng/L (ref <18)
Troponin I (High Sensitivity): 15 ng/L (ref <18)

## 2024-02-02 LAB — AMMONIA: Ammonia: 18 umol/L (ref 9–35)

## 2024-02-02 LAB — LITHIUM LEVEL: Lithium Lvl: 2.43 mmol/L (ref 0.60–1.20)

## 2024-02-02 LAB — PREGNANCY, URINE: Preg Test, Ur: NEGATIVE

## 2024-02-02 LAB — ACETAMINOPHEN LEVEL: Acetaminophen (Tylenol), Serum: <10 ug/mL — ABNORMAL LOW (ref 10–30)

## 2024-02-02 LAB — SALICYLATE LEVEL: Salicylate Lvl: <7 mg/dL — ABNORMAL LOW (ref 7.0–30.0)

## 2024-02-02 LAB — ETHANOL: Alcohol, Ethyl (B): <15 mg/dL (ref <15)

## 2024-02-02 LAB — HCG, QUANTITATIVE, PREGNANCY: hCG, Beta Chain, Quant, S: 1 m[IU]/mL (ref <5)

## 2024-02-02 LAB — MRSA NEXT GEN BY PCR, NASAL: MRSA by PCR Next Gen: NOT DETECTED

## 2024-02-02 MED ORDER — ONDANSETRON HCL 4 MG/2ML IJ SOLN
4.0000 mg | Freq: Once | INTRAMUSCULAR | Status: AC
  Administered 2024-02-02: 4 mg via INTRAVENOUS
  Filled 2024-02-02: qty 2

## 2024-02-02 MED ORDER — ONDANSETRON HCL 4 MG/2ML IJ SOLN: 4.0000 mg | Freq: Four times a day (QID) | INTRAMUSCULAR | Status: DC | PRN

## 2024-02-02 MED ORDER — ACETAMINOPHEN 325 MG PO TABS
650.0000 mg | ORAL_TABLET | Freq: Four times a day (QID) | ORAL | Status: DC | PRN
  Administered 2024-02-03: 650 mg via ORAL
  Filled 2024-02-02: qty 2

## 2024-02-02 MED ORDER — SODIUM CHLORIDE 0.9 % IV BOLUS
250.0000 mL | INTRAVENOUS | Status: AC
  Administered 2024-02-02: 250 mL via INTRAVENOUS

## 2024-02-02 MED ORDER — ACETAMINOPHEN 650 MG RE SUPP: 650.0000 mg | Freq: Four times a day (QID) | RECTAL | Status: DC | PRN

## 2024-02-02 MED ORDER — SENNOSIDES-DOCUSATE SODIUM 8.6-50 MG PO TABS: 1.0000 | ORAL_TABLET | Freq: Every evening | ORAL | Status: DC | PRN

## 2024-02-02 MED ORDER — MIDODRINE HCL 5 MG PO TABS
10.0000 mg | ORAL_TABLET | ORAL | Status: AC
  Administered 2024-02-02: 10 mg via ORAL
  Filled 2024-02-02: qty 2

## 2024-02-02 MED ORDER — INSULIN ASPART 100 UNIT/ML IJ SOLN
0.0000 [IU] | Freq: Three times a day (TID) | INTRAMUSCULAR | Status: DC
  Administered 2024-02-03: 2 [IU] via SUBCUTANEOUS
  Administered 2024-02-03: 5 [IU] via SUBCUTANEOUS
  Administered 2024-02-03 – 2024-02-04 (×2): 2 [IU] via SUBCUTANEOUS
  Administered 2024-02-04: 3 [IU] via SUBCUTANEOUS
  Administered 2024-02-04: 2 [IU] via SUBCUTANEOUS
  Administered 2024-02-05 (×2): 3 [IU] via SUBCUTANEOUS
  Administered 2024-02-05: 2 [IU] via SUBCUTANEOUS
  Administered 2024-02-06: 5 [IU] via SUBCUTANEOUS
  Administered 2024-02-06: 3 [IU] via SUBCUTANEOUS
  Filled 2024-02-02 (×11): qty 1

## 2024-02-02 MED ORDER — INSULIN ASPART 100 UNIT/ML IJ SOLN
0.0000 [IU] | Freq: Every day | INTRAMUSCULAR | Status: DC
  Administered 2024-02-04: 2 [IU] via SUBCUTANEOUS
  Filled 2024-02-02: qty 1

## 2024-02-02 MED ORDER — LEVOTHYROXINE SODIUM 100 MCG PO TABS
175.0000 ug | ORAL_TABLET | Freq: Every day | ORAL | Status: DC
  Administered 2024-02-03 – 2024-02-06 (×4): 175 ug via ORAL
  Filled 2024-02-02: qty 2
  Filled 2024-02-02 (×3): qty 1

## 2024-02-02 MED ORDER — HYDRALAZINE HCL 20 MG/ML IJ SOLN: 5.0000 mg | Freq: Four times a day (QID) | INTRAMUSCULAR | Status: DC | PRN

## 2024-02-02 MED ORDER — CLONAZEPAM 0.5 MG PO TABS: 1.0000 mg | ORAL_TABLET | Freq: Three times a day (TID) | ORAL | Status: DC | PRN

## 2024-02-02 MED ORDER — LACTATED RINGERS IV SOLN: INTRAVENOUS | Status: DC

## 2024-02-02 MED ORDER — HEPARIN SODIUM (PORCINE) 5000 UNIT/ML IJ SOLN
5000.0000 [IU] | Freq: Three times a day (TID) | INTRAMUSCULAR | Status: AC
  Administered 2024-02-03 – 2024-02-04 (×5): 5000 [IU] via SUBCUTANEOUS
  Filled 2024-02-02 (×5): qty 1

## 2024-02-02 MED ORDER — CHLORHEXIDINE GLUCONATE CLOTH 2 % EX PADS
6.0000 | MEDICATED_PAD | Freq: Every day | CUTANEOUS | Status: DC
  Administered 2024-02-03 – 2024-02-06 (×3): 6 via TOPICAL

## 2024-02-02 MED ORDER — ONDANSETRON HCL 4 MG PO TABS
4.0000 mg | ORAL_TABLET | Freq: Four times a day (QID) | ORAL | Status: DC | PRN
  Administered 2024-02-03: 4 mg via ORAL
  Filled 2024-02-02: qty 1

## 2024-02-02 MED ORDER — POTASSIUM CHLORIDE CRYS ER 20 MEQ PO TBCR
20.0000 meq | EXTENDED_RELEASE_TABLET | Freq: Once | ORAL | Status: AC
  Administered 2024-02-02: 20 meq via ORAL
  Filled 2024-02-02: qty 1

## 2024-02-02 MED ORDER — SODIUM CHLORIDE 0.9 % IV BOLUS
1000.0000 mL | Freq: Once | INTRAVENOUS | Status: AC
Start: 2024-02-02 — End: 2024-02-02
  Administered 2024-02-02: 1000 mL via INTRAVENOUS

## 2024-02-02 NOTE — H&P (Signed)
 History and Physical   Dawn Lambert FMW:[id number] DOB: 1990-10-08 DOA: 02/02/2024  PCP: [facility]  Patient coming from: [facility] via EMS  I have personally briefly reviewed patient's old medical records in [facility] EMR.  Chief Concern: Altered mental status  HPI: Dawn Lambert is a 33 year old female with history of schizophrenia, hypothyroid, morbid obesity, who presents EDfor chief concerns of altered mental status.  Vitals in the ED showed t of 98.1, rr 18, heart rate 71, blood pressure 145/126, SpO2 100% on room air.  Serum sodium is 140, potassium 3.0, chloride 100, bicarb 26, BUN of 25, serum creatinine 2.91, eGFR 21, nonfasting blood glucose 159, WBC 14.9, hemoglobin 10.7, platelet 278.  COVID/influenza A/influenza B/RSV PCR were negative.  CK was 1830.  High sensitive troponin is 12.  Increase lithium  level: At 2.43.  ED treatment: Ondansetron  4 mg IV one-time dose, sodium chloride  1 L bolus.  EDP consulted nephrology. ---------------------------------- At bedside, patient was able to tell me her first and last name, age, location, current calendar year.  She reports that she had fallen several times yesterday.  She reports she did hit her head and denies loss of consciousness.  She reports that her thighs and arms are hurting her.  She reports that this started happening after falling multiple times yesterday. She endorses dysuria that started several days ago.  She denies diarrhea, hematuria, chest pain, nausea, vomiting, fever, chills  Social history: She lives at [facility].  ROS: Constitutional: no weight change, no fever ENT/Mouth: no sore throat, no rhinorrhea Eyes: no eye pain, no vision changes Cardiovascular: no chest pain, no dyspnea,  no edema, no palpitations Respiratory: no cough, no sputum, no wheezing Gastrointestinal: no nausea, no vomiting, no diarrhea, no constipation Genitourinary: no  urinary incontinence, + dysuria, no hematuria Musculoskeletal: no arthralgias, no myalgias Skin: no skin lesions, no pruritus, Neuro: + weakness, no loss of consciousness, no syncope Psych: no anxiety, no depression, + decrease appetite Heme/Lymph: no bruising, no bleeding  ED Course: Discussed with EDP, patient requiring hospitalization for chief concerns of acute kidney injury and significant increase lithium  levels.  Assessment/Plan  Principal Problem:   AKI (acute kidney injury) Active Problems:   Rhabdomyolysis   Dysuria   Uncontrolled type 2 diabetes mellitus with hyperglycemia, without long-term current use of insulin  (HCC)   Intellectual disability   Schizoaffective disorder (HCC)   Obesity, Class III, BMI 40-49.9 (morbid obesity) (HCC)   Hypertension   Hypothyroidism   Generalized anxiety disorder   Borderline personality disorder (HCC)   Hypokalemia   Assessment and Plan:  * AKI (acute kidney injury) With hypokalemia Nephrology has been consulted Status post sodium chloride  1 L bolus per EDP On admission I ordered LR infusion at 125 mg/h, 1 day ordered  Dysuria UA ordered and pending Abx will be ordered  Rhabdomyolysis Mild to moderate rhabdomyolysis Fluid hydration Recheck CK level in the a.m.  Uncontrolled type 2 diabetes mellitus with hyperglycemia, without long-term current use of insulin  (HCC) Insulin  ssi with hs coverage Goal inpatient blood glucose is 140-180  Hypokalemia Check magnesium  level on admission, if low will replace as appropriate Potassium chloride 20 mEq p.o. one-time dose LR infusion at 125 mL/h, 1 day ordered  Generalized anxiety disorder PDMP reviewed Patient has clonazepam  1 mg, 87 tablets, 30-day supply filled on 01/31/2024; gabapentin  800 mg tablet, 93 tablets, 31-day supply filled on 01/21/24, tramadol 50 mg tablet, 15 tablets, 4-day supply filled on 01/09/2024  Hypothyroidism Home levothyroxine  175 mcg daily  resumed  Hypertension Hydralazine 5 mg IV every 6 hours as needed for SBP greater 170, days ordered Home lisinopril  10 mg daily not resumed on admission in setting of acute kidney injury AM team to resume when benefits outweigh the risk  Obesity, Class III, BMI 40-49.9 (morbid obesity) (HCC) Check weight on admission This complicates overall care and prognosis.   Schizoaffective disorder (HCC) Home lithium  not resumed on admission, a.m. team to resume when benefits outweigh the risk  Chart reviewed.   DVT prophylaxis: Heparin 5000 units subcutaneous every 8 hours Code Status: Full code Diet: N.p.o. pending swallow screen, once passed patient can have heart healthy diet Family Communication: updated mother and legal guardian  Disposition Plan: Pending clinical course Consults called: None at this time Admission status: Stepdown, inpatient  Past Medical History:  Diagnosis Date   Anxiety    Arnold-Chiari malformation (HCC)    Atrial septal defect    Complication of anesthesia    when pt was 11, woke up during surgery (muscle biopsy)   Constipation    Contraception management    Dandruff    Dermatomyositis (HCC)    age 50   Diabetes mellitus without complication (HCC)    Elevated blood pressure    Hypertension    Hypothyroidism    Mental retardation    Mood disorder    Overweight(278.02)    Pelvic pain    Prediabetes    Past Surgical History:  Procedure Laterality Date   ASD REPAIR     at age 66   UMBILICAL HERNIA REPAIR N/A 09/17/2017   Procedure: HERNIA REPAIR UMBILICAL ADULT;  Surgeon: Dessa Reyes ORN, MD;  Location: [facility] ORS;  Service: General;  Laterality: N/A;   Social History:  reports that she has never smoked. She has never used smokeless tobacco. She reports that she does not drink alcohol and does not use drugs.  Allergies  Allergen Reactions   Amoxicillin-Pot Clavulanate Other (See Comments)    Has patient had a PCN reaction causing immediate rash,  facial/tongue/throat swelling, SOB or lightheadedness with hypotension: ______  Has patient had a PCN reaction causing severe rash involving mucus membranes or skin necrosis: _______  Has patient had a PCN reaction that required hospitalization: _______  Has patient had a PCN reaction occurring within the last 10 years:_______  If all of the above answers are NO, then may proceed with Cephalosporin use.  Other Reaction(s): Unknown  Other reaction(s): Other (See Comments)  Has patient had a PCN reaction causing immediate rash, facial/tongue/throat swelling, SOB or lightheadedness with hypotension: ______  Has patient had a PCN reaction causing severe rash involving mucus membranes or skin necrosis: _______  Has patient had a PCN reaction that required hospitalization: _______  Has patient had a PCN reaction occurring within the last 10 years:_______  If all of the above answers are NO, then may proceed with Cephalosporin use.  Other Reaction(s): Chest Pain   History reviewed. No pertinent family history. Family history: Family history reviewed and not pertinent.  Prior to Admission medications   Medication Sig Start Date End Date Taking? Authorizing Provider  acetaminophen  (TYLENOL ) 325 MG tablet Take 650 mg by mouth every 6 (six) hours as needed for mild pain or moderate pain.   Yes [provider]  albuterol  (VENTOLIN  HFA) 108 (90 Base) MCG/ACT inhaler Inhale 2 puffs into the lungs every 6 (six) hours as needed for wheezing or shortness of breath. 12/18/23  Yes Ray,  Neha, MD  carbamazepine  (TEGRETOL -XR) 200 MG 12 hr tablet Take 1 tablet (200 mg total) by mouth 2 (two) times daily. 12/18/23 02/16/24 Yes Ray, Nilsa, MD  clonazePAM  (KLONOPIN ) 1 MG tablet Take 1 tablet (1 mg total) by mouth 2 (two) times daily. 12/18/23 02/16/24 Yes Ray, Nilsa, MD  Dulaglutide  0.75 MG/0.5ML SOAJ Inject 0.75 mg into the skin once a week. Wednesday 11/07/23  Yes [provider]  Fluocinolone   Acetonide Scalp 0.01 % OIL Apply to aa's scalp QD on Monday, Wednesday and Friday x 6 weeks. After 6 wks decrease to QW on Friday. May increase to QD on Monday, Wednesday, and Friday during times of flares. 08/24/22  Yes Hester Alm BROCKS, MD  fluticasone  (FLONASE ) 50 MCG/ACT nasal spray Place 1 spray into both nostrils daily as needed for allergies or rhinitis. 07/19/22  Yes [provider]  gabapentin  (NEURONTIN ) 800 MG tablet Take 1 tablet (800 mg total) by mouth 3 (three) times daily. 12/18/23 02/16/24 Yes Ray, Nilsa, MD  Guaifenesin  1200 MG TB12 Take 1,200 mg by mouth daily as needed (Congestion). 07/19/22  Yes [provider]  haloperidol  (HALDOL ) 5 MG tablet Take 1 tablet (5 mg total) by mouth at bedtime. 12/18/23 02/16/24 Yes Ray, Nilsa, MD  ibuprofen  (ADVIL ) 600 MG tablet Take 600 mg by mouth every 6 (six) hours as needed.   Yes [provider]  levothyroxine  (SYNTHROID ) 175 MCG tablet Take 1 tablet (175 mcg total) by mouth daily before breakfast. 12/18/23 02/16/24 Yes Ray, Nilsa, MD  lidocaine  (XYLOCAINE ) 2 % solution Use as directed 15 mLs in the mouth or throat as needed for mouth pain.   Yes [provider]  lisinopril  (ZESTRIL ) 10 MG tablet Take 1 tablet (10 mg total) by mouth daily. 12/18/23 02/16/24 Yes Ray, Nilsa, MD  lithium  carbonate 300 MG capsule Take 300-600 mg by mouth 2 (two) times daily. Take 300 mg by mouth every morning 600 mg At bedtime   Yes [provider]  loratadine  (CLARITIN ) 10 MG tablet Take 1 tablet (10 mg total) by mouth daily. 12/18/23 02/16/24 Yes Ray, Nilsa, MD  Multiple Vitamin (THEREMS PO) Take 1 tablet by mouth daily. Take with food   Yes [provider]  nystatin  (MYCOSTATIN /NYSTOP ) powder Apply 1 Application topically 2 (two) times daily. 12/18/23  Yes Ray, Nilsa, MD  nystatin  powder Apply 1 Application topically 2 (two) times daily as needed (fungal infection). Apply under breast   Yes [provider]   ondansetron  (ZOFRAN -ODT) 8 MG disintegrating tablet Take 8 mg by mouth 2 (two) times daily. 10/19/22  Yes [provider]  paliperidone  (INVEGA ) 6 MG 24 hr tablet Take 6 mg by mouth at bedtime. 01/30/24  Yes [provider]  propranolol  (INDERAL ) 40 MG tablet Take 1 tablet (40 mg total) by mouth 2 (two) times daily. 12/18/23 02/16/24 Yes Ray, Nilsa, MD  QUEtiapine  (SEROQUEL ) 200 MG tablet Take 200 mg by mouth at bedtime. 01/30/24  Yes [provider]  ziprasidone  (GEODON ) 20 MG capsule Take 1 capsule (20 mg total) by mouth once as needed (severe agitation). 09/19/20 10/22/20  Jacquetta Sharlot GRADE, NP   Physical Exam: Vitals:   02/02/24 1515 02/02/24 1516 02/02/24 1557 02/02/24 1600  BP: 92/61 (!) 111/56 (!) 88/58 (!) 112/50  Pulse: 72 72 85 82  Resp: 14 15 (!) 23 17  Temp:  98.3 F (36.8 C) 98.9 F (37.2 C)   TempSrc:  Oral Oral   SpO2: 99% 100% 98% 98%  Weight:  121 kg    Constitutional: appears older than chronological age, lethargic, calm Eyes: PERRL, lids and conjunctivae normal ENMT: Mucous membranes are moist. Posterior pharynx clear of any exudate or lesions. Age-appropriate dentition. Hearing appropriate Neck: normal, supple, no masses, no thyromegaly Respiratory: clear to auscultation bilaterally, no wheezing, no crackles. Normal respiratory effort. No accessory muscle use.  Cardiovascular: Regular rate and rhythm, no murmurs / rubs / gallops. No extremity edema. 2+ pedal pulses. No carotid bruits.  Abdomen: Morbidly obese abdomen, no tenderness, no masses palpated, no hepatosplenomegaly. Bowel sounds positive.  Musculoskeletal: no clubbing / cyanosis. No joint deformity upper and lower extremities. Good ROM, no contractures, no atrophy. Normal muscle tone.  Skin: no rashes, lesions, ulcers. No induration Neurologic: Sensation intact. Strength 5/5 in all 4.  Psychiatric: Normal judgment and insight. Alert and oriented x 3. Normal mood.   EKG: independently  reviewed, showing sinus rhythm with rate of 70, QTc 476  Chest x-ray on Admission: I personally reviewed and I agree with radiologist reading as below.  CT ABDOMEN PELVIS WO CONTRAST Result Date: 02/02/2024 CLINICAL DATA:  Altered mental status. EXAM: CT ABDOMEN AND PELVIS WITHOUT CONTRAST TECHNIQUE: Multidetector CT imaging of the abdomen and pelvis was performed following the standard protocol without IV contrast. RADIATION DOSE REDUCTION: This exam was performed according to the departmental dose-optimization program which includes automated exposure control, adjustment of the mA and/or kV according to patient size and/or use of iterative reconstruction technique. COMPARISON:  November 29, 2023 FINDINGS: Lower chest: No acute abnormality. Hepatobiliary: No focal liver abnormality is seen. No gallstones, gallbladder wall thickening, or biliary dilatation. Pancreas: Unremarkable. No pancreatic ductal dilatation or surrounding inflammatory changes. Spleen: Normal in size without focal abnormality. Adrenals/Urinary Tract: Adrenal glands are unremarkable. Kidneys are normal, without renal calculi, focal lesion, or hydronephrosis. The urinary bladder is moderately distended and is otherwise unremarkable. Stomach/Bowel: Stomach is within normal limits. Appendix appears normal. No evidence of bowel wall thickening, distention, or inflammatory changes. Vascular/Lymphatic: No significant vascular findings are present. No enlarged abdominal or pelvic lymph nodes. Reproductive: Uterus and bilateral adnexa are unremarkable. Other: No abdominal wall hernia or abnormality. No abdominopelvic ascites. Musculoskeletal: No acute or significant osseous findings. IMPRESSION: No acute or active process within the abdomen or pelvis. Electronically Signed   By: Suzen Dials M.D.   On: 02/02/2024 15:02   CT HEAD WO CONTRAST ( ) Result Date: 02/02/2024 CLINICAL DATA:  Mental status changes.  Head trauma.  Ataxia. EXAM: CT HEAD  WITHOUT CONTRAST CT CERVICAL SPINE WITHOUT CONTRAST TECHNIQUE: Multidetector CT imaging of the head and cervical spine was performed following the standard protocol without intravenous contrast. Multiplanar CT image reconstructions of the cervical spine were also generated. RADIATION DOSE REDUCTION: This exam was performed according to the departmental dose-optimization program which includes automated exposure control, adjustment of the mA and/or kV according to patient size and/or use of iterative reconstruction technique. COMPARISON:  01/30/2024 FINDINGS: CT HEAD FINDINGS Brain: There is no evidence for acute hemorrhage, hydrocephalus, mass lesion, or abnormal extra-axial fluid collection. No definite CT evidence for acute infarction. Vascular: No hyperdense vessel or unexpected calcification. Skull: No evidence for fracture. No worrisome lytic or sclerotic lesion. Sinuses/Orbits: Chronic mucosal disease noted maxillary, right sphenoid, frontal and ethmoid sinuses. Visualized portions of the globes and intraorbital fat are unremarkable. Other: None. CT CERVICAL SPINE FINDINGS Alignment: Normal. Skull base and vertebrae: No acute fracture. No primary bone lesion or focal pathologic process. Soft tissues and spinal canal: No prevertebral fluid or  swelling. No visible canal hematoma. Disc levels:  Intervertebral disc spaces are preserved. Upper chest: No acute findings. Other: None. IMPRESSION: 1. No acute intracranial abnormality. 2. Chronic paranasal sinusitis. 3. No cervical spine fracture or subluxation. Electronically Signed   By: Camellia Candle M.D.   On: 02/02/2024 13:34   CT Cervical Spine Wo Contrast Result Date: 02/02/2024 CLINICAL DATA:  Mental status changes.  Head trauma.  Ataxia. EXAM: CT HEAD WITHOUT CONTRAST CT CERVICAL SPINE WITHOUT CONTRAST TECHNIQUE: Multidetector CT imaging of the head and cervical spine was performed following the standard protocol without intravenous contrast. Multiplanar CT  image reconstructions of the cervical spine were also generated. RADIATION DOSE REDUCTION: This exam was performed according to the departmental dose-optimization program which includes automated exposure control, adjustment of the mA and/or kV according to patient size and/or use of iterative reconstruction technique. COMPARISON:  01/30/2024 FINDINGS: CT HEAD FINDINGS Brain: There is no evidence for acute hemorrhage, hydrocephalus, mass lesion, or abnormal extra-axial fluid collection. No definite CT evidence for acute infarction. Vascular: No hyperdense vessel or unexpected calcification. Skull: No evidence for fracture. No worrisome lytic or sclerotic lesion. Sinuses/Orbits: Chronic mucosal disease noted maxillary, right sphenoid, frontal and ethmoid sinuses. Visualized portions of the globes and intraorbital fat are unremarkable. Other: None. CT CERVICAL SPINE FINDINGS Alignment: Normal. Skull base and vertebrae: No acute fracture. No primary bone lesion or focal pathologic process. Soft tissues and spinal canal: No prevertebral fluid or swelling. No visible canal hematoma. Disc levels:  Intervertebral disc spaces are preserved. Upper chest: No acute findings. Other: None. IMPRESSION: 1. No acute intracranial abnormality. 2. Chronic paranasal sinusitis. 3. No cervical spine fracture or subluxation. Electronically Signed   By: Camellia Candle M.D.   On: 02/02/2024 13:34   DG Chest Portable 1 View Result Date: 02/02/2024 CLINICAL DATA:  Altered mental status. EXAM: PORTABLE CHEST 1 VIEW COMPARISON:  12/17/2021 FINDINGS: Low volume film. The cardio pericardial silhouette is enlarged. The lungs are clear without focal pneumonia, edema, pneumothorax or pleural effusion. No acute bony abnormality. Telemetry leads overlie the chest. IMPRESSION: No active disease. Electronically Signed   By: Camellia Candle M.D.   On: 02/02/2024 12:31    Labs on Admission: I have personally reviewed following labs CBC: Recent Labs   Lab 02/02/24 1140  WBC 14.9*  NEUTROABS 12.6*  HGB 10.7*  HCT 34.0*  MCV 93.7  PLT 278   Basic Metabolic Panel: Recent Labs  Lab 02/02/24 1140  NA 140  K 3.0*  CL 100  CO2 26  GLUCOSE 159*  BUN 25*  CREATININE 2.91*  CALCIUM  9.1   GFR: Estimated Creatinine Clearance: 33.8 mL/min (A) (by C-G formula based on SCr of 2.91 mg/dL (H)).  Liver Function Tests: Recent Labs  Lab 02/02/24 1140  AST 55*  ALT 22  ALKPHOS 76  BILITOT 0.9  PROT 6.6  ALBUMIN 3.0*   Recent Labs  Lab 02/02/24 1140  AMMONIA 18   Cardiac Enzymes: Recent Labs  Lab 02/02/24 1140  CKTOTAL 1,830*   CBG: Recent Labs  Lab 01/30/24 0946 02/02/24 1558  GLUCAP 173* 138*   Urine analysis:    Component Value Date/Time   COLORURINE STRAW (A) 11/29/2023 1644   APPEARANCEUR CLEAR (A) 11/29/2023 1644   APPEARANCEUR Clear 07/10/2013 1629   LABSPEC 1.044 (H) 11/29/2023 1644   LABSPEC 1.013 07/10/2013 1629   PHURINE 6.0 11/29/2023 1644   GLUCOSEU NEGATIVE 11/29/2023 1644   GLUCOSEU Negative 07/10/2013 1629   HGBUR NEGATIVE 11/29/2023 1644  BILIRUBINUR NEGATIVE 11/29/2023 1644   BILIRUBINUR Negative 07/10/2013 1629   KETONESUR NEGATIVE 11/29/2023 1644   PROTEINUR NEGATIVE 11/29/2023 1644   NITRITE NEGATIVE 11/29/2023 1644   LEUKOCYTESUR NEGATIVE 11/29/2023 1644   LEUKOCYTESUR Negative 07/10/2013 1629   CRITICAL CARE Performed by: Dr. Sherre  Total critical care time: 32 minutes  Critical care time was exclusive of separately billable procedures and treating other patients.  Critical care was necessary to treat or prevent imminent or life-threatening deterioration.  Critical care was time spent personally by me on the following activities: development of treatment plan with legal guardian/Mother, Dawn Lambert, as well as nursing, discussions with consultants, evaluation of patient's response to treatment, examination of patient, obtaining history from patient or surrogate, ordering and  performing treatments and interventions, ordering and review of laboratory studies, ordering and review of radiographic studies, pulse oximetry and re-evaluation of patient's condition.  This document was prepared using Dragon Voice Recognition software and may include unintentional dictation errors.  Dr. Sherre [facility]  If 7PM-7AM, please contact overnight-coverage provider If 7AM-7PM, please contact day attending provider [email or web address]  02/02/2024, 4:37 PM

## 2024-02-02 NOTE — Assessment & Plan Note (Signed)
 PDMP reviewed Patient has clonazepam  1 mg, 87 tablets, 30-day supply filled on 01/31/2024; gabapentin  800 mg tablet, 93 tablets, 31-day supply filled on 01/21/24, tramadol 50 mg tablet, 15 tablets, 4-day supply filled on 01/09/2024

## 2024-02-02 NOTE — ED Notes (Signed)
 Purewick was placed on pt, pt understood why it was needed and complied

## 2024-02-02 NOTE — Assessment & Plan Note (Signed)
 UA ordered and pending Abx will be ordered

## 2024-02-02 NOTE — Assessment & Plan Note (Signed)
 Home lithium  not resumed on admission, a.m. team to resume when benefits outweigh the risk

## 2024-02-02 NOTE — ED Provider Notes (Signed)
 [facility] Provider Note    Event Date/Time   First MD Initiated Contact with Patient 02/02/24 1156     (approximate)   History   Altered Mental Status   HPI  Dawn Lambert is a 33 y.o. female with history of schizophrenia coming in from [facility] who comes in with altered mental status.  Patient was not eating or drinking did not take her morning medications.  Patient was recently started on Haldol .  They do report that her last known normal was on Monday or Tuesday of this past week.  They report that she has had increasing fatigue and less responsive than normal.  Patient was found on the ground and per facility they had lowered her to the ground but she does have a history of a head trauma where she was seen on 10/1 with CT imaging that was negative and had a laceration repair.   Physical Exam   Triage Vital Signs: ED Triage Vitals  Encounter Vitals Group     BP 02/02/24 1136 (!) 145/126     Girls Systolic BP Percentile --      Girls Diastolic BP Percentile --      Boys Systolic BP Percentile --      Boys Diastolic BP Percentile --      Pulse Rate 02/02/24 1136 71     Resp 02/02/24 1136 18     Temp 02/02/24 1136 99.1 F (37.3 C)     Temp Source 02/02/24 1136 Oral     SpO2 02/02/24 1136 100 %     Weight --      Height --      Head Circumference --      Peak Flow --      Pain Score 02/02/24 1134 (S) 8     Pain Loc --      Pain Education --      Exclude from Growth Chart --     Most recent vital signs: Vitals:   02/02/24 1136  BP: (!) 145/126  Pulse: 71  Resp: 18  Temp: 99.1 F (37.3 C)  SpO2: 100%     General: Awake, patient appears sleepy although able to wake up and respond and follow commands. CV:  Good peripheral perfusion.  Resp:  Normal effort.  Abd:  No distention.  Slight tenderness noted in abdomen Other:  Moving all extremities well.  No obvious cranial nerve deficits   ED Results / Procedures /  Treatments   Labs (all labs ordered are listed, but only abnormal results are displayed) Labs Reviewed  RESP PANEL BY RT-PCR (RSV, FLU A&B, COVID)  [id number]  CBC WITH DIFFERENTIAL/PLATELET  COMPREHENSIVE METABOLIC PANEL WITH GFR  BLOOD GAS, VENOUS  AMMONIA  LITHIUM  LEVEL  ETHANOL  SALICYLATE LEVEL  ACETAMINOPHEN  LEVEL  URINALYSIS, ROUTINE W REFLEX MICROSCOPIC  URINE DRUG SCREEN, QUALITATIVE ([facility] ONLY)  CK  TROPONIN I (HIGH SENSITIVITY)     EKG  My interpretation of EKG:  Normal sinus rate of 70 without any ST elevation, T wave version lead III, normal intervals.  Reviewed prior EKGs and similar T wave inversion in lead III  RADIOLOGY I have reviewed the ct  personally and interpreted no ich   PROCEDURES:  Critical Care performed: yes   .1-3 Lead EKG Interpretation  Performed by: Ernest Ronal BRAVO, MD Authorized by: Ernest Ronal BRAVO, MD     Interpretation: normal     ECG rate:  70   ECG rate  assessment: normal     Rhythm: sinus rhythm     Ectopy: none     Conduction: normal   .Critical Care  Performed by: Ernest Ronal BRAVO, MD Authorized by: Ernest Ronal BRAVO, MD   Critical care provider statement:    Critical care time (minutes):  30   Critical care was necessary to treat or prevent imminent or life-threatening deterioration of the following conditions:  Shock   Critical care was time spent personally by me on the following activities:  Development of treatment plan with patient or surrogate, discussions with consultants, evaluation of patient's response to treatment, examination of patient, ordering and review of laboratory studies, ordering and review of radiographic studies, ordering and performing treatments and interventions, pulse oximetry, re-evaluation of patient's condition and review of old charts    MEDICATIONS ORDERED IN ED: Medications  sodium chloride  0.9 % bolus 1,000 mL (1,000 mLs Intravenous New Bag/Given 02/02/24 1215)  ondansetron  (ZOFRAN ) injection 4 mg  (4 mg Intravenous Given 02/02/24 1227)     IMPRESSION / MDM / ASSESSMENT AND PLAN / ED COURSE  I reviewed the triage vital signs and the nursing notes.   Patient's presentation is most consistent with acute presentation with potential threat to life or bodily function.   Differential includes intracranial hemorrhage, cervical fracture.  Appears neurologically intact although does appear fatigued.  Wehen I went into room her BP was 50/30s and pt was altered. Patient is hypotensive patient started with a liter of fluids and Zofran .  She reports diffuse discomfort all over so we will get CT abdomen to ensure no acute pathology causing her low blood pressure.  Will check labs to evaluate for Electra abnormalities, AKI  hCG negative.  COVID and flu are negative.  VBG without evidence of hypercapnia.  Her white count was elevated still pending the urine but no obvious source of fever at this time.  She does not meet any sepsis criteria so holding on blood cultures, lactate.  Patient does have elevated creatinine of 2.9 consistent with dehydration.  Patient does have a hypovolemic shock given her hypotension but she is responding to fluids and her mentation is improving.  Her lithium  levels are elevated at 2.43.  Discussed with patient's caregiver and there is no concerns for her having taken too much lithium  as they are in control of her medications.  Discussed the case with Dr. Dennise and recommend admission to stepdown for monitoring for possible need for dialysis.    The patient is on the cardiac monitor to evaluate for evidence of arrhythmia and/or significant heart rate changes.      FINAL CLINICAL IMPRESSION(S) / ED DIAGNOSES   Final diagnoses:  Hypovolemic shock (HCC)  AKI (acute kidney injury)  Lithium  toxicity, accidental or unintentional, initial encounter     Rx / DC Orders   ED Discharge Orders     None        Note:  This document was prepared using Dragon voice  recognition software and may include unintentional dictation errors.   Ernest Ronal BRAVO, MD 02/02/24 [phone]

## 2024-02-02 NOTE — Assessment & Plan Note (Addendum)
 Check magnesium  level on admission, if low will replace as appropriate Potassium chloride 20 mEq p.o. one-time dose LR infusion at 125 mL/h, 1 day ordered

## 2024-02-02 NOTE — ED Notes (Signed)
 Pt states she is still unable to given urine sample at current time.

## 2024-02-02 NOTE — ED Triage Notes (Signed)
 [facility] reports pt coming from [facility]. Staff states pt not responding normally today. States pt not eating or drinking and did not take her morning meds which are all her psych meds. Upon EMS arrival pt was lying on the floor. EMS states pt was initially able to walk but then they had to get the stair chair because pt was not able to walk anymore to the stretcher. Staff enroute to hospital.

## 2024-02-02 NOTE — Assessment & Plan Note (Signed)
 Check weight on admission This complicates overall care and prognosis.

## 2024-02-02 NOTE — Assessment & Plan Note (Signed)
 Insulin  ssi with hs coverage Goal inpatient blood glucose is 140-180

## 2024-02-02 NOTE — Assessment & Plan Note (Addendum)
 With hypokalemia Nephrology has been consulted Status post sodium chloride  1 L bolus per EDP On admission I ordered LR infusion at 125 mg/h, 1 day ordered

## 2024-02-02 NOTE — Assessment & Plan Note (Signed)
 Home levothyroxine 175 mcg daily resumed

## 2024-02-02 NOTE — Progress Notes (Signed)
 2152 - Pt noted to be hypotensive w/ SBP in the 80's. Patient asymptomatic. Multiple cuffs and multiple extremities checked. Due to body habitus, may not be getting the most accurate BP's, but previously BP's have been more within the normal ranges. Message sent to C. Hall, DO to notify.   2219 - New orders received for Midodrine PO x1 and NS 250 IVF bolus.   2300 - Due to needing new IV's, IVF bolus administration delayed.   0515 - Pt continues to have low BP. BP does improve when patient is more aware. Pt is quite drowsy this morning, but does respond to verbal stimuli.

## 2024-02-02 NOTE — ED Notes (Signed)
 Applied hospital socks on pt. Fall bundle is in place.

## 2024-02-02 NOTE — Assessment & Plan Note (Addendum)
 Hydralazine 5 mg IV every 6 hours as needed for SBP greater 170, days ordered Home lisinopril  10 mg daily not resumed on admission in setting of acute kidney injury AM team to resume when benefits outweigh the risk

## 2024-02-02 NOTE — Hospital Course (Signed)
 [name] is a 33 year old female with history of schizophrenia, hypothyroid, morbid obesity, who presents EDfor chief concerns of altered mental status.  Vitals in the ED showed t of 98.1, rr 18, heart rate 71, blood pressure 145/126, SpO2 100% on room air.  Serum sodium is 140, potassium 3.0, chloride 100, bicarb 26, BUN of 25, serum creatinine 2.91, eGFR 21, nonfasting blood glucose 159, WBC 14.9, hemoglobin 10.7, platelet 278.  COVID/influenza A/influenza B/RSV PCR were negative.  CK was 1830.  High sensitive troponin is 12.  Increase lithium  level: At 2.43.  ED treatment: Ondansetron  4 mg IV one-time dose, sodium chloride  1 L bolus.  EDP consulted nephrology.

## 2024-02-02 NOTE — Assessment & Plan Note (Signed)
 Mild to moderate rhabdomyolysis Fluid hydration Recheck CK level in the a.m.

## 2024-02-03 DIAGNOSIS — N179 Acute kidney failure, unspecified: Secondary | ICD-10-CM | POA: Diagnosis not present

## 2024-02-03 LAB — LITHIUM LEVEL: Lithium Lvl: 1.88 mmol/L (ref 0.60–1.20)

## 2024-02-03 LAB — BASIC METABOLIC PANEL WITH GFR
Anion gap: 10 (ref 5–15)
BUN: 19 mg/dL (ref 6–20)
CO2: 26 mmol/L (ref 22–32)
Calcium: 8.9 mg/dL (ref 8.9–10.3)
Chloride: 105 mmol/L (ref 98–111)
Creatinine, Ser: 1.95 mg/dL — ABNORMAL HIGH (ref 0.44–1.00)
GFR, Estimated: 34 mL/min — ABNORMAL LOW (ref >60)
Glucose, Bld: 160 mg/dL — ABNORMAL HIGH (ref 70–99)
Potassium: 3 mmol/L — ABNORMAL LOW (ref 3.5–5.1)
Sodium: 141 mmol/L (ref 135–145)

## 2024-02-03 LAB — CBC
HCT: 30.7 % — ABNORMAL LOW (ref 36.0–46.0)
Hemoglobin: 9.6 g/dL — ABNORMAL LOW (ref 12.0–15.0)
MCH: 29.6 pg (ref 26.0–34.0)
MCHC: 31.3 g/dL (ref 30.0–36.0)
MCV: 94.8 fL (ref 80.0–100.0)
Platelets: 165 K/uL (ref 150–400)
RBC: 3.24 MIL/uL — ABNORMAL LOW (ref 3.87–5.11)
RDW: 12.7 % (ref 11.5–15.5)
WBC: 11.3 K/uL — ABNORMAL HIGH (ref 4.0–10.5)
nRBC: 0 % (ref 0.0–0.2)

## 2024-02-03 LAB — GLUCOSE, CAPILLARY
Glucose-Capillary: 157 mg/dL — ABNORMAL HIGH (ref 70–99)
Glucose-Capillary: 262 mg/dL — ABNORMAL HIGH (ref 70–99)
Glucose-Capillary: 186 mg/dL — ABNORMAL HIGH (ref 70–99)
Glucose-Capillary: 175 mg/dL — ABNORMAL HIGH (ref 70–99)

## 2024-02-03 LAB — HEMOGLOBIN A1C
Hgb A1c MFr Bld: 6.9 % — ABNORMAL HIGH (ref 4.8–5.6)
Mean Plasma Glucose: 151.33 mg/dL

## 2024-02-03 LAB — CK: Total CK: 1009 U/L — ABNORMAL HIGH (ref 38–234)

## 2024-02-03 MED ORDER — POTASSIUM CHLORIDE CRYS ER 20 MEQ PO TBCR
40.0000 meq | EXTENDED_RELEASE_TABLET | Freq: Once | ORAL | Status: AC
  Administered 2024-02-03: 40 meq via ORAL
  Filled 2024-02-03: qty 2

## 2024-02-03 MED ORDER — QUETIAPINE FUMARATE 25 MG PO TABS
200.0000 mg | ORAL_TABLET | Freq: Every day | ORAL | Status: DC
  Administered 2024-02-03 – 2024-02-05 (×3): 200 mg via ORAL
  Filled 2024-02-03 (×3): qty 8

## 2024-02-03 MED ORDER — LORATADINE 10 MG PO TABS
10.0000 mg | ORAL_TABLET | Freq: Every day | ORAL | Status: DC
  Administered 2024-02-03 – 2024-02-06 (×4): 10 mg via ORAL
  Filled 2024-02-03 (×4): qty 1

## 2024-02-03 MED ORDER — CLONAZEPAM 0.5 MG PO TABS
1.0000 mg | ORAL_TABLET | Freq: Two times a day (BID) | ORAL | Status: DC
  Administered 2024-02-03 – 2024-02-06 (×7): 1 mg via ORAL
  Filled 2024-02-03 (×8): qty 2

## 2024-02-03 MED ORDER — PALIPERIDONE ER 3 MG PO TB24
6.0000 mg | ORAL_TABLET | Freq: Every day | ORAL | Status: DC
  Administered 2024-02-03 – 2024-02-05 (×3): 6 mg via ORAL
  Filled 2024-02-03 (×3): qty 2

## 2024-02-03 MED ORDER — OXYCODONE HCL 5 MG PO TABS
5.0000 mg | ORAL_TABLET | Freq: Four times a day (QID) | ORAL | Status: DC | PRN
  Administered 2024-02-03: 5 mg via ORAL
  Filled 2024-02-03: qty 1

## 2024-02-03 MED ORDER — CARBAMAZEPINE ER 200 MG PO TB12
200.0000 mg | ORAL_TABLET | Freq: Two times a day (BID) | ORAL | Status: DC
  Administered 2024-02-03 – 2024-02-06 (×6): 200 mg via ORAL
  Filled 2024-02-03 (×6): qty 1

## 2024-02-03 MED ORDER — PROPRANOLOL HCL 40 MG PO TABS
40.0000 mg | ORAL_TABLET | Freq: Two times a day (BID) | ORAL | Status: DC
  Administered 2024-02-03: 40 mg via ORAL
  Filled 2024-02-03 (×2): qty 1

## 2024-02-03 MED ORDER — HALOPERIDOL 0.5 MG PO TABS
5.0000 mg | ORAL_TABLET | Freq: Every day | ORAL | Status: DC
  Administered 2024-02-03 – 2024-02-05 (×3): 5 mg via ORAL
  Filled 2024-02-03 (×3): qty 10

## 2024-02-03 MED ORDER — INFLUENZA VIRUS VACC SPLIT PF (FLUZONE) 0.5 ML IM SUSY
0.5000 mL | PREFILLED_SYRINGE | INTRAMUSCULAR | Status: AC
  Administered 2024-02-04: 0.5 mL via INTRAMUSCULAR
  Filled 2024-02-03: qty 0.5

## 2024-02-03 MED ORDER — GABAPENTIN 400 MG PO CAPS
800.0000 mg | ORAL_CAPSULE | Freq: Three times a day (TID) | ORAL | Status: DC
Start: 2024-02-03 — End: 2024-02-06
  Administered 2024-02-03 – 2024-02-06 (×9): 800 mg via ORAL
  Filled 2024-02-03 (×9): qty 2

## 2024-02-03 MED ORDER — LACTATED RINGERS IV SOLN: INTRAVENOUS | Status: DC

## 2024-02-03 NOTE — Progress Notes (Signed)
 Pt transferred to 1C room 118.

## 2024-02-03 NOTE — Plan of Care (Signed)
  Problem: Clinical Measurements: Goal: Ability to maintain clinical measurements within normal limits will improve Outcome: Not Progressing Goal: Will remain free from infection Outcome: Progressing Goal: Diagnostic test results will improve Outcome: Progressing Goal: Respiratory complications will improve Outcome: Progressing Goal: Cardiovascular complication will be avoided Outcome: Not Progressing   Problem: Nutrition: Goal: Adequate nutrition will be maintained Outcome: Progressing   Problem: Elimination: Goal: Will not experience complications related to bowel motility Outcome: Progressing Goal: Will not experience complications related to urinary retention Outcome: Progressing   Problem: Pain Managment: Goal: General experience of comfort will improve and/or be controlled Outcome: Progressing

## 2024-02-03 NOTE — Consult Note (Signed)
 [facility]  CONSULT NOTE    Date: 02/03/2024                  Patient Name:  Dawn Lambert  MRN: [id number]  DOB: February 08, 1991  Age / Sex: 33 y.o., female         PCP: [facility]                 Service Requesting Consult: TRH                 Reason for Consult: Acute kidney injury            History of Present Illness: [name] is a 33 y.o.  female with past medical history of hypothyroidism, obesity, and schizophrenia, who was admitted to [facility] on 02/02/2024 for Hypovolemic shock (HCC) [R57.1] AKI (acute kidney injury) [N17.9] Lithium  toxicity, accidental or unintentional, initial encounter [T56.891A]  Patient presents to the emergency department from her group home with weakness and decreased mentation.  Patient is seen sitting up in bed in ICU.  Currently eating breakfast.  Alert and oriented.  Confirms progressive weakness prior to admission.  Denies nausea, vomiting, or diarrhea.  Denies pain.  Currently on room air.  Labs on ED arrival concerning for potassium 3.0, BUN 25, creatinine 2.91 with GFR 21, CK 1830, white count 14.9, and hemoglobin 10.7.  Elevated lithium  level 2.43.  Respiratory panel negative for influenza, COVID-19, and RSV.  Chest x-ray negative.  CT cervical spine and head negative for acute findings.  UDS positive for benzodiazepines and tricyclic's.  UA appears hazy with some bacteria.  Lithium  level today 1.88.   Medications: Outpatient medications: Medications Prior to Admission  Medication Sig Dispense Refill Last Dose/Taking   acetaminophen  (TYLENOL ) 325 MG tablet Take 650 mg by mouth every 6 (six) hours as needed for mild pain or moderate pain.   Unknown   albuterol  (VENTOLIN  HFA) 108 (90 Base) MCG/ACT inhaler Inhale 2 puffs into the lungs every 6 (six) hours as needed for wheezing or shortness of breath. 8 g 0 Unknown   carbamazepine  (TEGRETOL -XR) 200 MG 12 hr tablet Take 1 tablet (200 mg  total) by mouth 2 (two) times daily. 60 tablet 1 02/01/2024 at  8:00 PM   clonazePAM  (KLONOPIN ) 1 MG tablet Take 1 tablet (1 mg total) by mouth 2 (two) times daily. 60 tablet 1 02/01/2024 at  8:00 PM   Dulaglutide  0.75 MG/0.5ML SOAJ Inject 0.75 mg into the skin once a week. Wednesday   01/30/2024   Fluocinolone  Acetonide Scalp 0.01 % OIL Apply to aa's scalp QD on Monday, Wednesday and Friday x 6 weeks. After 6 wks decrease to QW on Friday. May increase to QD on Monday, Wednesday, and Friday during times of flares. 118.28 mL 11 Unknown   fluticasone  (FLONASE ) 50 MCG/ACT nasal spray Place 1 spray into both nostrils daily as needed for allergies or rhinitis.   Unknown   gabapentin  (NEURONTIN ) 800 MG tablet Take 1 tablet (800 mg total) by mouth 3 (three) times daily. 90 tablet 1 02/01/2024 at  8:00 PM   Guaifenesin  1200 MG TB12 Take 1,200 mg by mouth daily as needed (Congestion).   Unknown   haloperidol  (HALDOL ) 5 MG tablet Take 1 tablet (5 mg total) by mouth at bedtime. 30 tablet 1 02/01/2024 at  8:00 PM   ibuprofen  (ADVIL ) 600 MG tablet Take 600 mg by mouth every 6 (six) hours as needed.  Unknown   levothyroxine  (SYNTHROID ) 175 MCG tablet Take 1 tablet (175 mcg total) by mouth daily before breakfast. 30 tablet 1 02/01/2024 at  7:00 AM   lidocaine  (XYLOCAINE ) 2 % solution Use as directed 15 mLs in the mouth or throat as needed for mouth pain.   Unknown   lisinopril  (ZESTRIL ) 10 MG tablet Take 1 tablet (10 mg total) by mouth daily. 60 tablet 0 02/01/2024 at  8:00 AM   lithium  carbonate 300 MG capsule Take 300-600 mg by mouth 2 (two) times daily. Take 300 mg by mouth every morning 600 mg At bedtime   02/01/2024 at  8:00 PM   loratadine  (CLARITIN ) 10 MG tablet Take 1 tablet (10 mg total) by mouth daily. 30 tablet 1 02/01/2024 at  8:00 AM   Multiple Vitamin (THEREMS PO) Take 1 tablet by mouth daily. Take with food   02/01/2024   nystatin  (MYCOSTATIN /NYSTOP ) powder Apply 1 Application topically 2 (two) times daily. 15  g 0 Unknown   nystatin  powder Apply 1 Application topically 2 (two) times daily as needed (fungal infection). Apply under breast   Unknown   ondansetron  (ZOFRAN -ODT) 8 MG disintegrating tablet Take 8 mg by mouth 2 (two) times daily.   02/01/2024 at  8:00 PM   paliperidone  (INVEGA ) 6 MG 24 hr tablet Take 6 mg by mouth at bedtime.   02/01/2024 at  8:00 AM   propranolol  (INDERAL ) 40 MG tablet Take 1 tablet (40 mg total) by mouth 2 (two) times daily. 60 tablet 1 02/01/2024 at  8:00 PM   QUEtiapine  (SEROQUEL ) 200 MG tablet Take 200 mg by mouth at bedtime.   02/01/2024 at  8:00 PM    Current medications: Current Facility-Administered Medications  Medication Dose Route Frequency Provider Last Rate Last Admin   acetaminophen  (TYLENOL ) tablet 650 mg  650 mg Oral Q6H PRN Cox, Amy N, DO       Or   acetaminophen  (TYLENOL ) suppository 650 mg  650 mg Rectal Q6H PRN Cox, Amy N, DO       Chlorhexidine  Gluconate Cloth 2 % PADS 6 each  6 each Topical Q0600 Cox, Amy N, DO   6 each at 02/03/24 0518   clonazePAM  (KLONOPIN ) tablet 1 mg  1 mg Oral BID Alexander, Natalie, DO       heparin injection 5,000 Units  5,000 Units Subcutaneous Q8H Cox, Amy N, DO   5,000 Units at 02/03/24 0518   hydrALAZINE (APRESOLINE) injection 5 mg  5 mg Intravenous Q6H PRN Cox, Amy N, DO       [START ON 02/04/2024] influenza vac split trivalent PF (FLUZONE) injection 0.5 mL  0.5 mL Intramuscular Tomorrow-1000 Alexander, Natalie, DO       insulin  aspart (novoLOG ) injection 0-5 Units  0-5 Units Subcutaneous QHS Cox, Amy N, DO       insulin  aspart (novoLOG ) injection 0-9 Units  0-9 Units Subcutaneous TID WC Cox, Amy N, DO   5 Units at 02/03/24 1246   lactated ringers  infusion   Intravenous Continuous Alexander, Natalie, DO       levothyroxine  (SYNTHROID ) tablet 175 mcg  175 mcg Oral Q0600 Cox, Amy N, DO   175 mcg at 02/03/24 9380   ondansetron  (ZOFRAN ) tablet 4 mg  4 mg Oral Q6H PRN Cox, Amy N, DO       Or   ondansetron  (ZOFRAN ) injection 4 mg   4 mg Intravenous Q6H PRN Cox, Amy N, DO       senna-docusate (Senokot-S) tablet  1 tablet  1 tablet Oral QHS PRN Cox, Amy N, DO          Allergies: Allergies  Allergen Reactions   Amoxicillin-Pot Clavulanate Other (See Comments)    Has patient had a PCN reaction causing immediate rash, facial/tongue/throat swelling, SOB or lightheadedness with hypotension: ______  Has patient had a PCN reaction causing severe rash involving mucus membranes or skin necrosis: _______  Has patient had a PCN reaction that required hospitalization: _______  Has patient had a PCN reaction occurring within the last 10 years:_______  If all of the above answers are NO, then may proceed with Cephalosporin use.  Other Reaction(s): Unknown  Other reaction(s): Other (See Comments)  Has patient had a PCN reaction causing immediate rash, facial/tongue/throat swelling, SOB or lightheadedness with hypotension: ______  Has patient had a PCN reaction causing severe rash involving mucus membranes or skin necrosis: _______  Has patient had a PCN reaction that required hospitalization: _______  Has patient had a PCN reaction occurring within the last 10 years:_______  If all of the above answers are NO, then may proceed with Cephalosporin use.  Other Reaction(s): Chest Pain      Past Medical History: Past Medical History:  Diagnosis Date   Anxiety    Arnold-Chiari malformation (HCC)    Atrial septal defect    Complication of anesthesia    when pt was 11, woke up during surgery (muscle biopsy)   Constipation    Contraception management    Dandruff    Dermatomyositis (HCC)    age 31   Diabetes mellitus without complication (HCC)    Elevated blood pressure    Hypertension    Hypothyroidism    Mental retardation    Mood disorder    Overweight(278.02)    Pelvic pain    Prediabetes      Past Surgical History: Past Surgical History:  Procedure Laterality Date   ASD REPAIR     at age 89   UMBILICAL  HERNIA REPAIR N/A 09/17/2017   Procedure: HERNIA REPAIR UMBILICAL ADULT;  Surgeon: Dessa Reyes ORN, MD;  Location: [facility] ORS;  Service: General;  Laterality: N/A;     Family History: History reviewed. No pertinent family history.   Social History: Social History   Socioeconomic History   Marital status: Single    Spouse name: Not on file   Number of children: Not on file   Years of education: Not on file   Highest education level: Not on file  Occupational History   Not on file  Tobacco Use   Smoking status: Never   Smokeless tobacco: Never  Vaping Use   Vaping status: Never Used  Substance and Sexual Activity   Alcohol use: No   Drug use: No   Sexual activity: Not on file  Other Topics Concern   Not on file  Social History Narrative   Not on file   Social Drivers of Health   Financial Resource Strain: Not on file  Food Insecurity: No Food Insecurity (02/02/2024)   Hunger Vital Sign    Worried About Running Out of Food in the Last Year: Never true    Ran Out of Food in the Last Year: Never true  Transportation Needs: No Transportation Needs (02/02/2024)   PRAPARE - Administrator, Civil Service (Medical): No    Lack of Transportation (Non-Medical): No  Physical Activity: Not on file  Stress: Not on file  Social Connections: Not on file  Intimate Partner  Violence: Not At Risk (02/02/2024)   Humiliation, Afraid, Rape, and Kick questionnaire    Fear of Current or Ex-Partner: No    Emotionally Abused: No    Physically Abused: No    Sexually Abused: No     Review of Systems: Review of Systems  Constitutional:  Positive for malaise/fatigue. Negative for chills and fever.  HENT:  Negative for congestion, sore throat and tinnitus.   Eyes:  Negative for blurred vision and redness.  Respiratory:  Negative for cough, shortness of breath and wheezing.   Cardiovascular:  Negative for chest pain, palpitations, claudication and leg swelling.   Gastrointestinal:  Negative for abdominal pain, blood in stool, diarrhea, nausea and vomiting.  Genitourinary:  Negative for flank pain, frequency and hematuria.  Musculoskeletal:  Negative for back pain, falls and myalgias.  Skin:  Negative for rash.  Neurological:  Positive for weakness. Negative for dizziness and headaches.  Endo/Heme/Allergies:  Does not bruise/bleed easily.  Psychiatric/Behavioral:  Negative for depression. The patient is not nervous/anxious and does not have insomnia.     Vital Signs: Blood pressure (!) 144/66, pulse 78, temperature 98.8 F (37.1 C), temperature source Oral, resp. rate 16, height 5' 3 (1.6 m), weight 121 kg, SpO2 93%.  Weight trends: Filed Weights   02/02/24 1557  Weight: 121 kg    Physical Exam: General: NAD,   Head: Normocephalic, atraumatic. Moist oral mucosal membranes  Eyes: Anicteric  Lungs:  Clear to auscultation  Heart: Regular rate and rhythm  Abdomen:  Soft, nontender,   Extremities:  No peripheral edema.  Neurologic: Awake and alert  Skin: No lesions        Lab results: Basic Metabolic Panel: Recent Labs  Lab 02/02/24 1140 02/02/24 1645 02/03/24 0512  NA 140  --  141  K 3.0*  --  3.0*  CL 100  --  105  CO2 26  --  26  GLUCOSE 159*  --  160*  BUN 25*  --  19  CREATININE 2.91*  --  1.95*  CALCIUM  9.1  --  8.9  MG  --  2.3  --     Liver Function Tests: Recent Labs  Lab 02/02/24 1140  AST 55*  ALT 22  ALKPHOS 76  BILITOT 0.9  PROT 6.6  ALBUMIN 3.0*   No results for input(s): LIPASE, AMYLASE in the last 168 hours. Recent Labs  Lab 02/02/24 1140  AMMONIA 18    CBC: Recent Labs  Lab 02/02/24 1140 02/03/24 0512  WBC 14.9* 11.3*  NEUTROABS 12.6*  --   HGB 10.7* 9.6*  HCT 34.0* 30.7*  MCV 93.7 94.8  PLT 278 165    Cardiac Enzymes: Recent Labs  Lab 02/02/24 1140 02/03/24 0512  CKTOTAL 1,830* 1,009*    BNP: Invalid input(s): POCBNP  CBG: Recent Labs  Lab 01/30/24 0946  02/02/24 1558 02/02/24 2133 02/03/24 0734 02/03/24 1205  GLUCAP 173* 138* 151* 157* 262*    Microbiology: Results for orders placed or performed during the hospital encounter of 02/02/24  Resp panel by RT-PCR (RSV, Flu A&B, Covid) Anterior Nasal Swab     Status: None   Collection Time: 02/02/24 12:23 PM   Specimen: Anterior Nasal Swab  Result Value Ref Range Status   SARS Coronavirus 2 by RT PCR NEGATIVE NEGATIVE Final    Comment: (NOTE) SARS-CoV-2 target nucleic acids are NOT DETECTED.  The SARS-CoV-2 RNA is generally detectable in upper respiratory specimens during the acute phase of infection. The lowest concentration of  SARS-CoV-2 viral copies this assay can detect is 138 copies/mL. A negative result does not preclude SARS-Cov-2 infection and should not be used as the sole basis for treatment or other patient management decisions. A negative result may occur with  improper specimen collection/handling, submission of specimen other than nasopharyngeal swab, presence of viral mutation(s) within the areas targeted by this assay, and inadequate number of viral copies(<138 copies/mL). A negative result must be combined with clinical observations, patient history, and epidemiological information. The expected result is Negative.  Fact Sheet for Patients:  BloggerCourse.com  Fact Sheet for Healthcare Providers:  SeriousBroker.it  This test is no t yet approved or cleared by the [place]  FDA and  has been authorized for detection and/or diagnosis of SARS-CoV-2 by FDA under an Emergency Use Authorization (EUA). This EUA will remain  in effect (meaning this test can be used) for the duration of the COVID-19 declaration under Section 564(b)(1) of the Act, 21 U.S.C.section 360bbb-3(b)(1), unless the authorization is terminated  or revoked sooner.       Influenza A by PCR NEGATIVE NEGATIVE Final   Influenza B by PCR NEGATIVE  NEGATIVE Final    Comment: (NOTE) The Xpert Xpress SARS-CoV-2/FLU/RSV plus assay is intended as an aid in the diagnosis of influenza from Nasopharyngeal swab specimens and should not be used as a sole basis for treatment. Nasal washings and aspirates are unacceptable for Xpert Xpress SARS-CoV-2/FLU/RSV testing.  Fact Sheet for Patients: BloggerCourse.com  Fact Sheet for Healthcare Providers: SeriousBroker.it  This test is not yet approved or cleared by the [place]  FDA and has been authorized for detection and/or diagnosis of SARS-CoV-2 by FDA under an Emergency Use Authorization (EUA). This EUA will remain in effect (meaning this test can be used) for the duration of the COVID-19 declaration under Section 564(b)(1) of the Act, 21 U.S.C. section 360bbb-3(b)(1), unless the authorization is terminated or revoked.     Resp Syncytial Virus by PCR NEGATIVE NEGATIVE Final    Comment: (NOTE) Fact Sheet for Patients: BloggerCourse.com  Fact Sheet for Healthcare Providers: SeriousBroker.it  This test is not yet approved or cleared by the [place]  FDA and has been authorized for detection and/or diagnosis of SARS-CoV-2 by FDA under an Emergency Use Authorization (EUA). This EUA will remain in effect (meaning this test can be used) for the duration of the COVID-19 declaration under Section 564(b)(1) of the Act, 21 U.S.C. section 360bbb-3(b)(1), unless the authorization is terminated or revoked.  Performed at [facility], [address] [place], [name] [address]   MRSA Next Gen by PCR, Nasal     Status: None   Collection Time: 02/02/24  4:07 PM   Specimen: Nasal Mucosa; Nasal Swab  Result Value Ref Range Status   MRSA by PCR Next Gen NOT DETECTED NOT DETECTED Final    Comment: (NOTE) The GeneXpert MRSA Assay (FDA approved for NASAL specimens only), is one  component of a comprehensive MRSA colonization surveillance program. It is not intended to diagnose MRSA infection nor to guide or monitor treatment for MRSA infections. Test performance is not FDA approved in patients less than 7 years old. Performed at [facility], [address] [place], [name] [address]     Coagulation Studies: No results for input(s): LABPROT, INR in the last 72 hours.  Urinalysis: Recent Labs    02/02/24 1703  COLORURINE YELLOW*  LABSPEC 1.014  PHURINE 5.0  GLUCOSEU NEGATIVE  HGBUR NEGATIVE  BILIRUBINUR NEGATIVE  KETONESUR NEGATIVE  PROTEINUR  30*  NITRITE NEGATIVE  LEUKOCYTESUR NEGATIVE      Imaging: CT ABDOMEN PELVIS WO CONTRAST Result Date: 02/02/2024 CLINICAL DATA:  Altered mental status. EXAM: CT ABDOMEN AND PELVIS WITHOUT CONTRAST TECHNIQUE: Multidetector CT imaging of the abdomen and pelvis was performed following the standard protocol without IV contrast. RADIATION DOSE REDUCTION: This exam was performed according to the departmental dose-optimization program which includes automated exposure control, adjustment of the mA and/or kV according to patient size and/or use of iterative reconstruction technique. COMPARISON:  November 29, 2023 FINDINGS: Lower chest: No acute abnormality. Hepatobiliary: No focal liver abnormality is seen. No gallstones, gallbladder wall thickening, or biliary dilatation. Pancreas: Unremarkable. No pancreatic ductal dilatation or surrounding inflammatory changes. Spleen: Normal in size without focal abnormality. Adrenals/Urinary Tract: Adrenal glands are unremarkable. Kidneys are normal, without renal calculi, focal lesion, or hydronephrosis. The urinary bladder is moderately distended and is otherwise unremarkable. Stomach/Bowel: Stomach is within normal limits. Appendix appears normal. No evidence of bowel wall thickening, distention, or inflammatory changes. Vascular/Lymphatic: No significant vascular findings are  present. No enlarged abdominal or pelvic lymph nodes. Reproductive: Uterus and bilateral adnexa are unremarkable. Other: No abdominal wall hernia or abnormality. No abdominopelvic ascites. Musculoskeletal: No acute or significant osseous findings. IMPRESSION: No acute or active process within the abdomen or pelvis. Electronically Signed   By: Suzen Dials M.D.   On: 02/02/2024 15:02   CT HEAD WO CONTRAST ( ) Result Date: 02/02/2024 CLINICAL DATA:  Mental status changes.  Head trauma.  Ataxia. EXAM: CT HEAD WITHOUT CONTRAST CT CERVICAL SPINE WITHOUT CONTRAST TECHNIQUE: Multidetector CT imaging of the head and cervical spine was performed following the standard protocol without intravenous contrast. Multiplanar CT image reconstructions of the cervical spine were also generated. RADIATION DOSE REDUCTION: This exam was performed according to the departmental dose-optimization program which includes automated exposure control, adjustment of the mA and/or kV according to patient size and/or use of iterative reconstruction technique. COMPARISON:  01/30/2024 FINDINGS: CT HEAD FINDINGS Brain: There is no evidence for acute hemorrhage, hydrocephalus, mass lesion, or abnormal extra-axial fluid collection. No definite CT evidence for acute infarction. Vascular: No hyperdense vessel or unexpected calcification. Skull: No evidence for fracture. No worrisome lytic or sclerotic lesion. Sinuses/Orbits: Chronic mucosal disease noted maxillary, right sphenoid, frontal and ethmoid sinuses. Visualized portions of the globes and intraorbital fat are unremarkable. Other: None. CT CERVICAL SPINE FINDINGS Alignment: Normal. Skull base and vertebrae: No acute fracture. No primary bone lesion or focal pathologic process. Soft tissues and spinal canal: No prevertebral fluid or swelling. No visible canal hematoma. Disc levels:  Intervertebral disc spaces are preserved. Upper chest: No acute findings. Other: None. IMPRESSION: 1. No  acute intracranial abnormality. 2. Chronic paranasal sinusitis. 3. No cervical spine fracture or subluxation. Electronically Signed   By: Camellia Candle M.D.   On: 02/02/2024 13:34   CT Cervical Spine Wo Contrast Result Date: 02/02/2024 CLINICAL DATA:  Mental status changes.  Head trauma.  Ataxia. EXAM: CT HEAD WITHOUT CONTRAST CT CERVICAL SPINE WITHOUT CONTRAST TECHNIQUE: Multidetector CT imaging of the head and cervical spine was performed following the standard protocol without intravenous contrast. Multiplanar CT image reconstructions of the cervical spine were also generated. RADIATION DOSE REDUCTION: This exam was performed according to the departmental dose-optimization program which includes automated exposure control, adjustment of the mA and/or kV according to patient size and/or use of iterative reconstruction technique. COMPARISON:  01/30/2024 FINDINGS: CT HEAD FINDINGS Brain: There is no evidence for acute hemorrhage, hydrocephalus, mass lesion,  or abnormal extra-axial fluid collection. No definite CT evidence for acute infarction. Vascular: No hyperdense vessel or unexpected calcification. Skull: No evidence for fracture. No worrisome lytic or sclerotic lesion. Sinuses/Orbits: Chronic mucosal disease noted maxillary, right sphenoid, frontal and ethmoid sinuses. Visualized portions of the globes and intraorbital fat are unremarkable. Other: None. CT CERVICAL SPINE FINDINGS Alignment: Normal. Skull base and vertebrae: No acute fracture. No primary bone lesion or focal pathologic process. Soft tissues and spinal canal: No prevertebral fluid or swelling. No visible canal hematoma. Disc levels:  Intervertebral disc spaces are preserved. Upper chest: No acute findings. Other: None. IMPRESSION: 1. No acute intracranial abnormality. 2. Chronic paranasal sinusitis. 3. No cervical spine fracture or subluxation. Electronically Signed   By: Camellia Candle M.D.   On: 02/02/2024 13:34   DG Chest Portable 1  View Result Date: 02/02/2024 CLINICAL DATA:  Altered mental status. EXAM: PORTABLE CHEST 1 VIEW COMPARISON:  12/17/2021 FINDINGS: Low volume film. The cardio pericardial silhouette is enlarged. The lungs are clear without focal pneumonia, edema, pneumothorax or pleural effusion. No acute bony abnormality. Telemetry leads overlie the chest. IMPRESSION: No active disease. Electronically Signed   By: Camellia Candle M.D.   On: 02/02/2024 12:31     Assessment & Plan: Dawn Lambert is a 33 y.o.  female with past medical history of hypothyroidism, obesity, and schizophrenia, who was admitted to [facility] on 02/02/2024 for Hypovolemic shock (HCC) [R57.1] AKI (acute kidney injury) [N17.9] Lithium  toxicity, accidental or unintentional, initial encounter [T56.891A]  Acute kidney injury likely secondary to lithium  toxicity and mild rhabdomyolysis..  Baseline creatinine appears to be 1.0 on 12/13/2023.  Creatinine 2.91 on ED arrival, has already improved to 1.95.  CK18 100 on arrival, decreased to 1000 now.  No acute indication for dialysis.  Continue supportive measures and correction of underlying cause.  2.  Acute lithium  toxicity lithium .  S lithium  level 2.43 on admission.  Currently 1.88.  Lithium  remains held at this time.    LOS: 1 Lachina Salsberry 10/5/20252:36 PM

## 2024-02-03 NOTE — Progress Notes (Signed)
 Date and time results received: 02/03/24 1050  Test: Lithium   Critical Value: 1.88  Name of Provider Notified: Dr. Marsa

## 2024-02-03 NOTE — Evaluation (Signed)
 Physical Therapy Evaluation Patient Details Name: Dawn Lambert MRN: [id number] DOB: January 24, 1991 Today's Date: 02/03/2024  History of Present Illness  [name] is a 33 year old female with history of schizophrenia, hypothyroid, morbid obesity, who presents EDfor chief concerns of altered mental status.  Clinical Impression  Patient seen for initial PT evaluation due to decline in functional status . Patient is A&O x 4 with cognitive deficits at baseline. Pt responds well to providing positive feedback. Baseline mobility reported as independent, currently requiring modA x 2 to stand at bedside from family in room. Pt requires continuous verbal cues when instructing mobility in addition to verbal encouragement. Pt limited by continuous reports of dizziness which was monitored throughout session was note worsened by OOB mobility. BP continuously increased during orthostatic testing. Pt BP 129/71 mmHG at rest and responded normal to standing although slightly elevated in standing 151/96 mmHg; HR normal. Pt lives in group home which is handicap accessible. Clinical impression: patient presents with moderate mobility limitations secondary to acute medical complications. Recommend skilled PT to address safety, mobility, and discharge planning. PT recommend SNF at this time. Pt status likely to improve as  dizziness symptoms resolve.      If plan is discharge home, recommend the following: Two people to help with walking and/or transfers;A lot of help with bathing/dressing/bathroom   Can travel by private vehicle   No    Equipment Recommendations Rolling walker (2 wheels)  Recommendations for Other Services       Functional Status Assessment Patient has had a recent decline in their functional status and/or demonstrates limited ability to make significant improvements in function in a reasonable and predictable amount of time     Precautions / Restrictions Restrictions Weight Bearing  Restrictions Per Provider Order: No      Mobility  Bed Mobility Overal bed mobility: Needs Assistance Bed Mobility: Rolling, Sidelying to Sit Rolling: Min assist Sidelying to sit: Min assist       General bed mobility comments: vc for movement sequence; physical assistance to LE and manage bed covers    Transfers Overall transfer level: Needs assistance Equipment used: 2 person hand held assist Transfers: Sit to/from Stand Sit to Stand: Mod assist, +2 physical assistance           General transfer comment: brother assisted with stand; education provided on safety and lifting technique    Ambulation/Gait               General Gait Details: defferred to due verbal reports of dizziness  Stairs            Wheelchair Mobility     Tilt Bed    Modified Rankin (Stroke Patients Only)       Balance Overall balance assessment: Needs assistance Sitting-balance support: Single extremity supported, Feet supported Sitting balance-Leahy Scale: Good     Standing balance support: During functional activity (reliant on x2 modA x2 to stand; improved to minA x 2 to stand for safety) Standing balance-Leahy Scale: Fair Standing balance comment: able to sit to stand x 3; able to stand long enough for BP; requires constant vc to keep standing                             Pertinent Vitals/Pain Pain Assessment Pain Assessment: No/denies pain    Home Living Family/patient expects to be discharged to:: Group home Living Arrangements: Group Home Available Help at Discharge: Family Type of  Home: Group Home Home Access: Stairs to enter Entrance Stairs-Rails: None Entrance Stairs-Number of Steps: 2            Prior Function Prior Level of Function : Independent/Modified Independent             Mobility Comments: family at bedside reports that she was independent with all mobility prior to admission; pt attended water aerobics weekly at the [facility]        Extremity/Trunk Assessment        Lower Extremity Assessment Lower Extremity Assessment: Generalized weakness    Cervical / Trunk Assessment Cervical / Trunk Assessment: Normal  Communication   Communication Communication: No apparent difficulties    Cognition Arousal: Alert Behavior During Therapy: WFL for tasks assessed/performed   PT - Cognitive impairments: History of cognitive impairments, Initiation                         Following commands: Intact       Cueing Cueing Techniques: Verbal cues, Tactile cues     General Comments      Exercises     Assessment/Plan    PT Assessment Patient needs continued PT services  PT Problem List Decreased strength;Decreased range of motion;Decreased activity tolerance;Decreased balance;Decreased knowledge of use of DME;Decreased coordination;Decreased mobility;Decreased safety awareness       PT Treatment Interventions Gait training;DME instruction;Stair training;Functional mobility training;Therapeutic activities;Patient/family education;Neuromuscular re-education;Balance training;Therapeutic exercise    PT Goals (Current goals can be found in the Care Plan section)  Acute Rehab PT Goals Patient Stated Goal: Pt gets stronger PT Goal Formulation: With patient/family Time For Goal Achievement: 02/17/24 Potential to Achieve Goals: Good    Frequency Min 3X/week     Co-evaluation               AM-PAC PT 6 Clicks Mobility  Outcome Measure Help needed turning from your back to your side while in a flat bed without using bedrails?: A Little Help needed moving from lying on your back to sitting on the side of a flat bed without using bedrails?: A Little Help needed moving to and from a bed to a chair (including a wheelchair)?: A Lot Help needed standing up from a chair using your arms (e.g., wheelchair or bedside chair)?: A Lot Help needed to walk in hospital room?: Total Help needed climbing 3-5  steps with a railing? : Total 6 Click Score: 12    End of Session Equipment Utilized During Treatment: Gait belt Activity Tolerance: Other (comment) (treatment limited by continous reports of dizziness)   Nurse Communication: Mobility status PT Visit Diagnosis: Unsteadiness on feet (R26.81);Difficulty in walking, not elsewhere classified (R26.2);Muscle weakness (generalized) (M62.81);Dizziness and giddiness (R42)    Time: 8589-8557 PT Time Calculation (min) (ACUTE ONLY): 32 min   Charges:   PT Evaluation $PT Eval Low Complexity: 1 Low   PT General Charges $$ ACUTE PT VISIT: 1 Visit         [name] DPT, PT    Darlisha Kelm A Markus Casten 02/03/2024, 3:09 PM

## 2024-02-03 NOTE — Progress Notes (Signed)
 PROGRESS NOTE    Dawn Lambert   FMW:[id number] DOB: 1990/07/16  DOA: 02/02/2024 Date of Service: 02/03/24 which is hospital day 1  PCP: [facility] course / significant events:   HPI: [name] is a 33 year old female with history of schizophrenia, intellectual disability who has been in a group home for 15 years, hypothyroid, morbid obesity, who presents ED from group home for chief concerns of altered mental status. Staff noted pt not eating or drinking and did not take her morning meds which are all her psych meds. Upon EMS arrival pt was lying on the floor. EMS reported pt was initially able to walk but then they had to get the stair chair because pt was not able to walk anymore to the stretcher. She reports that she had fallen several times. She reports she did hit her head but denies loss of consciousness. She reports that her thighs and arms are hurting her.  She reports that this started happening after falling multiple times yesterday. She endorses dysuria that started several days ago.  10/04: to ED. CK 1830. Hypokalemia 3.0. Increase lithium  level 2.43 (1.2 is upper limit normal). Cr 2.91 and GFR 21. Imaging no concerns on CT head/Cspine or CT abd/pelv, CXR. Admitted for AKI, Rhabdomyolysis.  10/05:Cr 1.95 and GFR 34. CK improved 1009. Continuing on IV fluids.      Consultants:  none  Procedures/Surgeries: none      ASSESSMENT & PLAN:   AKI (acute kidney injury) - improving  Baseline normal renal function / GFR >60 Cr 2.91 on admission 10/04 --> 1.95 on 10/05 Continue on lactated ringer , rate reduced today Monitor BMP   Rhabdomyolysis d/t fall  Mild to moderate rhabdomyolysis Fluid hydration Monitor CK and renal fxn   Dysuria - normal UA Dc abx     Uncontrolled type 2 diabetes mellitus with hyperglycemia, without long-term current use of insulin   A1C 6.9 Insulin  ssi with hs coverage Goal inpatient blood  glucose is 140-180   Hypokalemia Normal Mg Monitor BMP Replace K as needed    Generalized anxiety disorder Schizoaffective disorder  Depression Mood disorder  Elevated serum lithium  level Borderline Personality D/o  clonazepam  1 mg, 87 tablets, 30-day supply filled on 01/31/2024 --> continue at 1 mg bid gabapentin  800 mg tablet, 93 tablets 31-day supply filled on 01/21/24 --> continue at 800 mg tid  Some confusion re: Carbamazepine  200 mg bid (recent notes state 400 mg bid - pharmacy to please confirm?) Continue Haldol  5 mg at bedtime  Some confusion re: invega  6 mg at bedtime (notes state she is on injection - pharmacy to please confirm whether po or injection?) Some uncertainty re: Seroquel  200 mg at bedtime (recent notes state XR 400 mg qhs + IR 50 mg am - pharmacy to please confirm?)  Continue propranolol  40 mg bid  Home lithium  not resumed d/t elevated levels    Hypothyroidism Continue levothyroxine  175 mcg daily    Hypertension Hydralazine 5 mg IV every 6 hours as needed for SBP greater 170, days ordered Home lisinopril  10 mg daily not resumed on admission in setting of acute kidney injury Continue propranolol  40 mg bid       Class 3 obesity based on BMI: Body mass index is 48.79 kg/m.SABRA Significantly low or high BMI is associated with higher medical risk.  Underweight - under 18  overweight - 25 to 29 obese - 30 or more Class 1 obesity: BMI of 30.0  to 34 Class 2 obesity: BMI of 35.0 to 39 Class 3 obesity: BMI of 40.0 to 49 Super Morbid Obesity: BMI 50-59 Super-super Morbid Obesity: BMI 60+ Healthy nutrition and physical activity advised as adjunct to other disease management and risk reduction treatments    DVT prophylaxis: heparin d/t AKI IV fluids: LR continuous IV fluids  Nutrition: carb modified diet  Central lines / other devices: none  Code Status: FULL CODE ACP documentation reviewed:  none on file in [facility]  TOC needs: plan back to group home / per  legal guardian  Medical barriers to dispo: AKI, rhabdo. Expected medical readiness for discharge pending clinical improvement, expect few days..                Subjective / Brief ROS:  Patient reports some frustration w/ her group home they are letting me fall down - mother states she has a historyof aacting out and not too worried that there is any mistreatment, mother is pleased w/ current group home and thinks they are doing well managing behavioral issues overall  Denies CP/SOB.  Pain controlled.    Family Communication: discussion w/ mother, father at bedside later today, brother also present     Objective Findings:  Vitals:   02/03/24 1000 02/03/24 1100 02/03/24 1119 02/03/24 1153  BP: 136/66 137/81  (!) 144/66  Pulse: 81 71  78  Resp: (!) 28 16    Temp:    98.8 F (37.1 C)  TempSrc:    Oral  SpO2: 98% 98%  93%  Weight:      Height:   5' 3 (1.6 m)     Intake/Output Summary (Last 24 hours) at 02/03/2024 1633 Last data filed at 02/03/2024 0900 Gross per 24 hour  Intake 1733.69 ml  Output 302 ml  Net 1431.69 ml   Filed Weights   02/02/24 1557  Weight: 121 kg    Examination:  Physical Exam Constitutional:      General: She is not in acute distress. Cardiovascular:     Rate and Rhythm: Normal rate and regular rhythm.  Pulmonary:     Effort: Pulmonary effort is normal.     Breath sounds: Normal breath sounds.  Abdominal:     Palpations: Abdomen is soft.  Musculoskeletal:     Right lower leg: No edema.     Left lower leg: No edema.  Skin:    General: Skin is warm and dry.  Neurological:     General: No focal deficit present.     Mental Status: She is alert. Mental status is at baseline.  Psychiatric:        Mood and Affect: Mood normal.        Behavior: Behavior normal.          Scheduled Medications:   carbamazepine   200 mg Oral BID   Chlorhexidine  Gluconate Cloth  6 each Topical Q0600   clonazePAM   1 mg Oral BID   gabapentin    800 mg Oral TID   haloperidol   5 mg Oral QHS   heparin  5,000 Units Subcutaneous Q8H   [START ON 02/04/2024] influenza vac split trivalent PF  0.5 mL Intramuscular Tomorrow-1000   insulin  aspart  0-5 Units Subcutaneous QHS   insulin  aspart  0-9 Units Subcutaneous TID WC   levothyroxine   175 mcg Oral Q0600   loratadine   10 mg Oral Daily   paliperidone   6 mg Oral QHS   propranolol   40 mg Oral BID   QUEtiapine   200 mg Oral QHS    Continuous Infusions:  lactated ringers  75 mL/hr at 02/03/24 1531    PRN Medications:  acetaminophen  **OR** acetaminophen , hydrALAZINE, ondansetron  **OR** ondansetron  (ZOFRAN ) IV, senna-docusate  Antimicrobials from admission:  Anti-infectives (From admission, onward)    None           Data Reviewed:  I have personally reviewed the following...  CBC: Recent Labs  Lab 02/02/24 1140 02/03/24 0512  WBC 14.9* 11.3*  NEUTROABS 12.6*  --   HGB 10.7* 9.6*  HCT 34.0* 30.7*  MCV 93.7 94.8  PLT 278 165   Basic Metabolic Panel: Recent Labs  Lab 02/02/24 1140 02/02/24 1645 02/03/24 0512  NA 140  --  141  K 3.0*  --  3.0*  CL 100  --  105  CO2 26  --  26  GLUCOSE 159*  --  160*  BUN 25*  --  19  CREATININE 2.91*  --  1.95*  CALCIUM  9.1  --  8.9  MG  --  2.3  --    GFR: Estimated Creatinine Clearance: 51.2 mL/min (A) (by C-G formula based on SCr of 1.95 mg/dL (H)). Liver Function Tests: Recent Labs  Lab 02/02/24 1140  AST 55*  ALT 22  ALKPHOS 76  BILITOT 0.9  PROT 6.6  ALBUMIN 3.0*   No results for input(s): LIPASE, AMYLASE in the last 168 hours. Recent Labs  Lab 02/02/24 1140  AMMONIA 18   Coagulation Profile: No results for input(s): INR, PROTIME in the last 168 hours. Cardiac Enzymes: Recent Labs  Lab 02/02/24 1140 02/03/24 0512  CKTOTAL 1,830* 1,009*   BNP (last 3 results) No results for input(s): PROBNP in the last 8760 hours. HbA1C: Recent Labs    02/03/24 0512  HGBA1C 6.9*   CBG: Recent Labs   Lab 01/30/24 0946 02/02/24 1558 02/02/24 2133 02/03/24 0734 02/03/24 1205  GLUCAP 173* 138* 151* 157* 262*   Lipid Profile: No results for input(s): CHOL, HDL, LDLCALC, TRIG, CHOLHDL, LDLDIRECT in the last 72 hours. Thyroid  Function Tests: No results for input(s): TSH, T4TOTAL, FREET4, T3FREE, THYROIDAB in the last 72 hours. Anemia Panel: No results for input(s): VITAMINB12, FOLATE, FERRITIN, TIBC, IRON, RETICCTPCT in the last 72 hours. Most Recent Urinalysis On File:     Component Value Date/Time   COLORURINE YELLOW (A) 02/02/2024 1703   APPEARANCEUR HAZY (A) 02/02/2024 1703   APPEARANCEUR Clear 07/10/2013 1629   LABSPEC 1.014 02/02/2024 1703   LABSPEC 1.013 07/10/2013 1629   PHURINE 5.0 02/02/2024 1703   GLUCOSEU NEGATIVE 02/02/2024 1703   GLUCOSEU Negative 07/10/2013 1629   HGBUR NEGATIVE 02/02/2024 1703   BILIRUBINUR NEGATIVE 02/02/2024 1703   BILIRUBINUR Negative 07/10/2013 1629   KETONESUR NEGATIVE 02/02/2024 1703   PROTEINUR 30 (A) 02/02/2024 1703   NITRITE NEGATIVE 02/02/2024 1703   LEUKOCYTESUR NEGATIVE 02/02/2024 1703   LEUKOCYTESUR Negative 07/10/2013 1629   Sepsis Labs: @LABRCNTIP (procalcitonin:4,lacticidven:4) Microbiology: Recent Results (from the past 240 hours)  Resp panel by RT-PCR (RSV, Flu A&B, Covid) Anterior Nasal Swab     Status: None   Collection Time: 02/02/24 12:23 PM   Specimen: Anterior Nasal Swab  Result Value Ref Range Status   SARS Coronavirus 2 by RT PCR NEGATIVE NEGATIVE Final    Comment: (NOTE) SARS-CoV-2 target nucleic acids are NOT DETECTED.  The SARS-CoV-2 RNA is generally detectable in upper respiratory specimens during the acute phase of infection. The lowest concentration of SARS-CoV-2 viral copies this assay can detect is 138 copies/mL. A negative  result does not preclude SARS-Cov-2 infection and should not be used as the sole basis for treatment or other patient management decisions. A  negative result may occur with  improper specimen collection/handling, submission of specimen other than nasopharyngeal swab, presence of viral mutation(s) within the areas targeted by this assay, and inadequate number of viral copies(<138 copies/mL). A negative result must be combined with clinical observations, patient history, and epidemiological information. The expected result is Negative.  Fact Sheet for Patients:  BloggerCourse.com  Fact Sheet for Healthcare Providers:  SeriousBroker.it  This test is no t yet approved or cleared by the [place]  FDA and  has been authorized for detection and/or diagnosis of SARS-CoV-2 by FDA under an Emergency Use Authorization (EUA). This EUA will remain  in effect (meaning this test can be used) for the duration of the COVID-19 declaration under Section 564(b)(1) of the Act, 21 U.S.C.section 360bbb-3(b)(1), unless the authorization is terminated  or revoked sooner.       Influenza A by PCR NEGATIVE NEGATIVE Final   Influenza B by PCR NEGATIVE NEGATIVE Final    Comment: (NOTE) The Xpert Xpress SARS-CoV-2/FLU/RSV plus assay is intended as an aid in the diagnosis of influenza from Nasopharyngeal swab specimens and should not be used as a sole basis for treatment. Nasal washings and aspirates are unacceptable for Xpert Xpress SARS-CoV-2/FLU/RSV testing.  Fact Sheet for Patients: BloggerCourse.com  Fact Sheet for Healthcare Providers: SeriousBroker.it  This test is not yet approved or cleared by the [place]  FDA and has been authorized for detection and/or diagnosis of SARS-CoV-2 by FDA under an Emergency Use Authorization (EUA). This EUA will remain in effect (meaning this test can be used) for the duration of the COVID-19 declaration under Section 564(b)(1) of the Act, 21 U.S.C. section 360bbb-3(b)(1), unless the authorization  is terminated or revoked.     Resp Syncytial Virus by PCR NEGATIVE NEGATIVE Final    Comment: (NOTE) Fact Sheet for Patients: BloggerCourse.com  Fact Sheet for Healthcare Providers: SeriousBroker.it  This test is not yet approved or cleared by the [place]  FDA and has been authorized for detection and/or diagnosis of SARS-CoV-2 by FDA under an Emergency Use Authorization (EUA). This EUA will remain in effect (meaning this test can be used) for the duration of the COVID-19 declaration under Section 564(b)(1) of the Act, 21 U.S.C. section 360bbb-3(b)(1), unless the authorization is terminated or revoked.  Performed at [facility], [address] [place], [name] [address]   MRSA Next Gen by PCR, Nasal     Status: None   Collection Time: 02/02/24  4:07 PM   Specimen: Nasal Mucosa; Nasal Swab  Result Value Ref Range Status   MRSA by PCR Next Gen NOT DETECTED NOT DETECTED Final    Comment: (NOTE) The GeneXpert MRSA Assay (FDA approved for NASAL specimens only), is one component of a comprehensive MRSA colonization surveillance program. It is not intended to diagnose MRSA infection nor to guide or monitor treatment for MRSA infections. Test performance is not FDA approved in patients less than 73 years old. Performed at [facility], [address], [place], [name] [address]       Radiology Studies last 3 days: CT ABDOMEN PELVIS WO CONTRAST Result Date: 02/02/2024 CLINICAL DATA:  Altered mental status. EXAM: CT ABDOMEN AND PELVIS WITHOUT CONTRAST TECHNIQUE: Multidetector CT imaging of the abdomen and pelvis was performed following the standard protocol without IV contrast. RADIATION DOSE REDUCTION: This exam was performed according to the departmental  dose-optimization program which includes automated exposure control, adjustment of the mA and/or kV according to patient size and/or use of iterative  reconstruction technique. COMPARISON:  November 29, 2023 FINDINGS: Lower chest: No acute abnormality. Hepatobiliary: No focal liver abnormality is seen. No gallstones, gallbladder wall thickening, or biliary dilatation. Pancreas: Unremarkable. No pancreatic ductal dilatation or surrounding inflammatory changes. Spleen: Normal in size without focal abnormality. Adrenals/Urinary Tract: Adrenal glands are unremarkable. Kidneys are normal, without renal calculi, focal lesion, or hydronephrosis. The urinary bladder is moderately distended and is otherwise unremarkable. Stomach/Bowel: Stomach is within normal limits. Appendix appears normal. No evidence of bowel wall thickening, distention, or inflammatory changes. Vascular/Lymphatic: No significant vascular findings are present. No enlarged abdominal or pelvic lymph nodes. Reproductive: Uterus and bilateral adnexa are unremarkable. Other: No abdominal wall hernia or abnormality. No abdominopelvic ascites. Musculoskeletal: No acute or significant osseous findings. IMPRESSION: No acute or active process within the abdomen or pelvis. Electronically Signed   By: Suzen Dials M.D.   On: 02/02/2024 15:02   CT HEAD WO CONTRAST ( ) Result Date: 02/02/2024 CLINICAL DATA:  Mental status changes.  Head trauma.  Ataxia. EXAM: CT HEAD WITHOUT CONTRAST CT CERVICAL SPINE WITHOUT CONTRAST TECHNIQUE: Multidetector CT imaging of the head and cervical spine was performed following the standard protocol without intravenous contrast. Multiplanar CT image reconstructions of the cervical spine were also generated. RADIATION DOSE REDUCTION: This exam was performed according to the departmental dose-optimization program which includes automated exposure control, adjustment of the mA and/or kV according to patient size and/or use of iterative reconstruction technique. COMPARISON:  01/30/2024 FINDINGS: CT HEAD FINDINGS Brain: There is no evidence for acute hemorrhage, hydrocephalus, mass  lesion, or abnormal extra-axial fluid collection. No definite CT evidence for acute infarction. Vascular: No hyperdense vessel or unexpected calcification. Skull: No evidence for fracture. No worrisome lytic or sclerotic lesion. Sinuses/Orbits: Chronic mucosal disease noted maxillary, right sphenoid, frontal and ethmoid sinuses. Visualized portions of the globes and intraorbital fat are unremarkable. Other: None. CT CERVICAL SPINE FINDINGS Alignment: Normal. Skull base and vertebrae: No acute fracture. No primary bone lesion or focal pathologic process. Soft tissues and spinal canal: No prevertebral fluid or swelling. No visible canal hematoma. Disc levels:  Intervertebral disc spaces are preserved. Upper chest: No acute findings. Other: None. IMPRESSION: 1. No acute intracranial abnormality. 2. Chronic paranasal sinusitis. 3. No cervical spine fracture or subluxation. Electronically Signed   By: Camellia Candle M.D.   On: 02/02/2024 13:34   CT Cervical Spine Wo Contrast Result Date: 02/02/2024 CLINICAL DATA:  Mental status changes.  Head trauma.  Ataxia. EXAM: CT HEAD WITHOUT CONTRAST CT CERVICAL SPINE WITHOUT CONTRAST TECHNIQUE: Multidetector CT imaging of the head and cervical spine was performed following the standard protocol without intravenous contrast. Multiplanar CT image reconstructions of the cervical spine were also generated. RADIATION DOSE REDUCTION: This exam was performed according to the departmental dose-optimization program which includes automated exposure control, adjustment of the mA and/or kV according to patient size and/or use of iterative reconstruction technique. COMPARISON:  01/30/2024 FINDINGS: CT HEAD FINDINGS Brain: There is no evidence for acute hemorrhage, hydrocephalus, mass lesion, or abnormal extra-axial fluid collection. No definite CT evidence for acute infarction. Vascular: No hyperdense vessel or unexpected calcification. Skull: No evidence for fracture. No worrisome lytic or  sclerotic lesion. Sinuses/Orbits: Chronic mucosal disease noted maxillary, right sphenoid, frontal and ethmoid sinuses. Visualized portions of the globes and intraorbital fat are unremarkable. Other: None. CT CERVICAL SPINE FINDINGS Alignment: Normal. Skull base  and vertebrae: No acute fracture. No primary bone lesion or focal pathologic process. Soft tissues and spinal canal: No prevertebral fluid or swelling. No visible canal hematoma. Disc levels:  Intervertebral disc spaces are preserved. Upper chest: No acute findings. Other: None. IMPRESSION: 1. No acute intracranial abnormality. 2. Chronic paranasal sinusitis. 3. No cervical spine fracture or subluxation. Electronically Signed   By: Camellia Candle M.D.   On: 02/02/2024 13:34   DG Chest Portable 1 View Result Date: 02/02/2024 CLINICAL DATA:  Altered mental status. EXAM: PORTABLE CHEST 1 VIEW COMPARISON:  12/17/2021 FINDINGS: Low volume film. The cardio pericardial silhouette is enlarged. The lungs are clear without focal pneumonia, edema, pneumothorax or pleural effusion. No acute bony abnormality. Telemetry leads overlie the chest. IMPRESSION: No active disease. Electronically Signed   By: Camellia Candle M.D.   On: 02/02/2024 12:31       Time spent: 50 min     Gabriella Guile, DO [facility] 02/03/2024, 4:33 PM    Dictation software may have been used to generate the above note. Typos may occur and escape review in typed/dictated notes. Please contact Dr Marsa directly for clarity if needed.  Staff may message me via secure chat in Epic  but this may not receive an immediate response,  please page me for urgent matters!  If 7PM-7AM, please contact night coverage [email or web address]

## 2024-02-04 DIAGNOSIS — N179 Acute kidney failure, unspecified: Secondary | ICD-10-CM | POA: Diagnosis not present

## 2024-02-04 LAB — BASIC METABOLIC PANEL WITH GFR
Anion gap: 12 (ref 5–15)
BUN: 14 mg/dL (ref 6–20)
CO2: 27 mmol/L (ref 22–32)
Calcium: 9.7 mg/dL (ref 8.9–10.3)
Chloride: 104 mmol/L (ref 98–111)
Creatinine, Ser: 1.37 mg/dL — ABNORMAL HIGH (ref 0.44–1.00)
GFR, Estimated: 52 mL/min — ABNORMAL LOW (ref >60)
Glucose, Bld: 183 mg/dL — ABNORMAL HIGH (ref 70–99)
Potassium: 3.4 mmol/L — ABNORMAL LOW (ref 3.5–5.1)
Sodium: 143 mmol/L (ref 135–145)

## 2024-02-04 LAB — CK: Total CK: 384 U/L — ABNORMAL HIGH (ref 38–234)

## 2024-02-04 LAB — GLUCOSE, CAPILLARY
Glucose-Capillary: 162 mg/dL — ABNORMAL HIGH (ref 70–99)
Glucose-Capillary: 204 mg/dL — ABNORMAL HIGH (ref 70–99)
Glucose-Capillary: 178 mg/dL — ABNORMAL HIGH (ref 70–99)
Glucose-Capillary: 214 mg/dL — ABNORMAL HIGH (ref 70–99)

## 2024-02-04 MED ORDER — PROPRANOLOL HCL 10 MG PO TABS
10.0000 mg | ORAL_TABLET | Freq: Two times a day (BID) | ORAL | Status: DC
  Administered 2024-02-04 – 2024-02-06 (×4): 10 mg via ORAL
  Filled 2024-02-04 (×4): qty 1

## 2024-02-04 MED ORDER — ENOXAPARIN SODIUM 60 MG/0.6ML IJ SOSY
0.5000 mg/kg | PREFILLED_SYRINGE | INTRAMUSCULAR | Status: DC
  Administered 2024-02-04 – 2024-02-05 (×2): 60 mg via SUBCUTANEOUS
  Filled 2024-02-04 (×2): qty 0.6

## 2024-02-04 NOTE — Evaluation (Signed)
 Occupational Therapy Evaluation Patient Details Name: Dawn Lambert MRN: [id number] DOB: 06-12-1990 Today's Date: 02/04/2024   History of Present Illness   Dawn Lambert is a 33 year old female with history of schizophrenia, intellectual disability who has been in a group home for 15 years, hypothyroid, morbid obesity, who presents ED from group home for chief concerns of altered mental status, admitted for AKI.    Clinical Impressions Dawn Lambert was seen for OT evaluation this date. Prior to hospital admission, pt was IND. Pt lives at group home with 2 STE. Pt currently requires CGA + RW sit<>stand from bed and chair heights, cues to sequence hand placement. MIN A + RW chair>bed step t/f and ~5 steps at EOB. MIN A don/doff gown in sitting. CGA standing grooming tasks, tolerates ~2 min standing, limited by reported dizziness and fatigue. Pt would benefit from skilled OT to address noted impairments and functional limitations (see below for any additional details). Upon hospital discharge, recommend OT follow up <3 hours/day.     If plan is discharge home, recommend the following:   A lot of help with bathing/dressing/bathroom;A little help with walking and/or transfers     Functional Status Assessment   Patient has had a recent decline in their functional status and demonstrates the ability to make significant improvements in function in a reasonable and predictable amount of time.     Equipment Recommendations   BSC/3in1     Recommendations for Other Services         Precautions/Restrictions   Precautions Precautions: Fall Recall of Precautions/Restrictions: Impaired Restrictions Weight Bearing Restrictions Per Provider Order: No     Mobility Bed Mobility Overal bed mobility: Needs Assistance Bed Mobility: Sit to Supine       Sit to supine: Min assist        Transfers Overall transfer level: Needs assistance Equipment used: Rolling walker (2  wheels) Transfers: Sit to/from Stand Sit to Stand: Contact guard assist                  Balance Overall balance assessment: Needs assistance Sitting-balance support: Feet supported Sitting balance-Leahy Scale: Good     Standing balance support: Single extremity supported, During functional activity Standing balance-Leahy Scale: Poor                             ADL either performed or assessed with clinical judgement   ADL Overall ADL's : Needs assistance/impaired                                       General ADL Comments: MIN A + RW for simulated BSC t/f. MIN A don/doff gown in sitting. CGA standing grooming tasks, tolerates ~2 min standing      Pertinent Vitals/Pain Pain Assessment Pain Assessment: No/denies pain     Extremity/Trunk Assessment Upper Extremity Assessment Upper Extremity Assessment: Generalized weakness (BUE edema)   Lower Extremity Assessment Lower Extremity Assessment: Generalized weakness       Communication Communication Communication: No apparent difficulties   Cognition Arousal: Alert Behavior During Therapy: WFL for tasks assessed/performed Cognition: History of cognitive impairments             OT - Cognition Comments: pleasant, delayed processing baseline but responds quickly to verbal cues, repeated cues to sfaely sequence t/fs  Following commands: Intact       Cueing  General Comments          Exercises     Shoulder Instructions      Home Living Family/patient expects to be discharged to:: Group home                                        Prior Functioning/Environment Prior Level of Function : Independent/Modified Independent             Mobility Comments: per PT: family at bedside reports that she was independent with all mobility prior to admission; pt attended water aerobics weekly at the [facility]      OT Problem List: Decreased  strength;Decreased range of motion;Decreased activity tolerance;Impaired balance (sitting and/or standing);Increased edema   OT Treatment/Interventions: Self-care/ADL training;Therapeutic exercise;Energy conservation;DME and/or AE instruction;Therapeutic activities;Patient/family education;Balance training      OT Goals(Current goals can be found in the care plan section)   Acute Rehab OT Goals Patient Stated Goal: to go to rehab OT Goal Formulation: With patient Time For Goal Achievement: 02/18/24 Potential to Achieve Goals: Good ADL Goals Pt Will Perform Grooming: with supervision;standing Pt Will Perform Lower Body Dressing: sit to/from stand;with min assist Pt Will Transfer to Toilet: with contact guard assist;ambulating;regular height toilet   OT Frequency:  Min 2X/week    Co-evaluation              AM-PAC OT 6 Clicks Daily Activity     Outcome Measure Help from another person eating meals?: None Help from another person taking care of personal grooming?: A Little Help from another person toileting, which includes using toliet, bedpan, or urinal?: A Little Help from another person bathing (including washing, rinsing, drying)?: A Lot Help from another person to put on and taking off regular upper body clothing?: A Little Help from another person to put on and taking off regular lower body clothing?: A Lot 6 Click Score: 17   End of Session Equipment Utilized During Treatment: Rolling walker (2 wheels);Gait belt  Activity Tolerance: Patient tolerated treatment well Patient left: in bed;with call bell/phone within reach;with bed alarm set  OT Visit Diagnosis: Other abnormalities of gait and mobility (R26.89);Muscle weakness (generalized) (M62.81)                Time: 8573-8551 OT Time Calculation (min): 22 min Charges:  OT General Charges $OT Visit: 1 Visit OT Evaluation $OT Eval Moderate Complexity: 1 Mod OT Treatments $Self Care/Home Management : 8-22  mins  Elston Slot, M.S. OTR/L  02/04/24, 3:18 PM  ascom [phone]

## 2024-02-04 NOTE — Progress Notes (Addendum)
 [facility]  ROUNDING NOTE   Subjective:   Patient seen resting quietly Easily aroused Tolerating meals Room air  Creatinine 1.37  Objective:  Vital signs in last 24 hours:  Temp:  [98.1 F (36.7 C)-98.8 F (37.1 C)] 98.2 F (36.8 C) (10/06 0753) Pulse Rate:  [58-78] 58 (10/06 0753) Resp:  [18-19] 19 (10/06 0753) BP: (74-144)/(51-73) 99/51 (10/06 0753) SpO2:  [93 %-100 %] 98 % (10/06 0753)  Weight change:  Filed Weights   02/02/24 1557  Weight: 121 kg    Intake/Output: I/O last 3 completed shifts: In: 1584.2 [I.V.:1329.4; IV Piggyback:254.8] Out: 302 [Urine:301; Stool:1]   Intake/Output this shift:  No intake/output data recorded.  Physical Exam: General: NAD  Head: Normocephalic, atraumatic. Moist oral mucosal membranes  Eyes: Anicteric  Lungs:  Clear to auscultation, normal effort  Heart: Regular rate and rhythm  Abdomen:  Soft, nontender  Extremities:  No peripheral edema.  Neurologic: Awake, alert, conversant  Skin: Warm,dry, no rash       Basic Metabolic Panel: Recent Labs  Lab 02/02/24 1140 02/02/24 1645 02/03/24 0512 02/04/24 0824  NA 140  --  141 143  K 3.0*  --  3.0* 3.4*  CL 100  --  105 104  CO2 26  --  26 27  GLUCOSE 159*  --  160* 183*  BUN 25*  --  19 14  CREATININE 2.91*  --  1.95* 1.37*  CALCIUM  9.1  --  8.9 9.7  MG  --  2.3  --   --     Liver Function Tests: Recent Labs  Lab 02/02/24 1140  AST 55*  ALT 22  ALKPHOS 76  BILITOT 0.9  PROT 6.6  ALBUMIN 3.0*   No results for input(s): LIPASE, AMYLASE in the last 168 hours. Recent Labs  Lab 02/02/24 1140  AMMONIA 18    CBC: Recent Labs  Lab 02/02/24 1140 02/03/24 0512  WBC 14.9* 11.3*  NEUTROABS 12.6*  --   HGB 10.7* 9.6*  HCT 34.0* 30.7*  MCV 93.7 94.8  PLT 278 165    Cardiac Enzymes: Recent Labs  Lab 02/02/24 1140 02/03/24 0512 02/04/24 0824  CKTOTAL 1,830* 1,009* 384*    BNP: Invalid input(s): POCBNP  CBG: Recent Labs   Lab 02/03/24 0734 02/03/24 1205 02/03/24 1736 02/03/24 1949 02/04/24 0754  GLUCAP 157* 262* 186* 175* 162*    Microbiology: Results for orders placed or performed during the hospital encounter of 02/02/24  Resp panel by RT-PCR (RSV, Flu A&B, Covid) Anterior Nasal Swab     Status: None   Collection Time: 02/02/24 12:23 PM   Specimen: Anterior Nasal Swab  Result Value Ref Range Status   SARS Coronavirus 2 by RT PCR NEGATIVE NEGATIVE Final    Comment: (NOTE) SARS-CoV-2 target nucleic acids are NOT DETECTED.  The SARS-CoV-2 RNA is generally detectable in upper respiratory specimens during the acute phase of infection. The lowest concentration of SARS-CoV-2 viral copies this assay can detect is 138 copies/mL. A negative result does not preclude SARS-Cov-2 infection and should not be used as the sole basis for treatment or other patient management decisions. A negative result may occur with  improper specimen collection/handling, submission of specimen other than nasopharyngeal swab, presence of viral mutation(s) within the areas targeted by this assay, and inadequate number of viral copies(<138 copies/mL). A negative result must be combined with clinical observations, patient history, and epidemiological information. The expected result is Negative.  Fact Sheet for Patients:  BloggerCourse.com  Fact Sheet for Healthcare Providers:  SeriousBroker.it  This test is no t yet approved or cleared by the [place]  FDA and  has been authorized for detection and/or diagnosis of SARS-CoV-2 by FDA under an Emergency Use Authorization (EUA). This EUA will remain  in effect (meaning this test can be used) for the duration of the COVID-19 declaration under Section 564(b)(1) of the Act, 21 U.S.C.section 360bbb-3(b)(1), unless the authorization is terminated  or revoked sooner.       Influenza A by PCR NEGATIVE NEGATIVE Final    Influenza B by PCR NEGATIVE NEGATIVE Final    Comment: (NOTE) The Xpert Xpress SARS-CoV-2/FLU/RSV plus assay is intended as an aid in the diagnosis of influenza from Nasopharyngeal swab specimens and should not be used as a sole basis for treatment. Nasal washings and aspirates are unacceptable for Xpert Xpress SARS-CoV-2/FLU/RSV testing.  Fact Sheet for Patients: BloggerCourse.com  Fact Sheet for Healthcare Providers: SeriousBroker.it  This test is not yet approved or cleared by the [place]  FDA and has been authorized for detection and/or diagnosis of SARS-CoV-2 by FDA under an Emergency Use Authorization (EUA). This EUA will remain in effect (meaning this test can be used) for the duration of the COVID-19 declaration under Section 564(b)(1) of the Act, 21 U.S.C. section 360bbb-3(b)(1), unless the authorization is terminated or revoked.     Resp Syncytial Virus by PCR NEGATIVE NEGATIVE Final    Comment: (NOTE) Fact Sheet for Patients: BloggerCourse.com  Fact Sheet for Healthcare Providers: SeriousBroker.it  This test is not yet approved or cleared by the [place]  FDA and has been authorized for detection and/or diagnosis of SARS-CoV-2 by FDA under an Emergency Use Authorization (EUA). This EUA will remain in effect (meaning this test can be used) for the duration of the COVID-19 declaration under Section 564(b)(1) of the Act, 21 U.S.C. section 360bbb-3(b)(1), unless the authorization is terminated or revoked.  Performed at [facility], [address] [place], [name] [address]   MRSA Next Gen by PCR, Nasal     Status: None   Collection Time: 02/02/24  4:07 PM   Specimen: Nasal Mucosa; Nasal Swab  Result Value Ref Range Status   MRSA by PCR Next Gen NOT DETECTED NOT DETECTED Final    Comment: (NOTE) The GeneXpert MRSA Assay (FDA approved for NASAL  specimens only), is one component of a comprehensive MRSA colonization surveillance program. It is not intended to diagnose MRSA infection nor to guide or monitor treatment for MRSA infections. Test performance is not FDA approved in patients less than 91 years old. Performed at [facility], [address] [place], [name] [address]     Coagulation Studies: No results for input(s): LABPROT, INR in the last 72 hours.  Urinalysis: Recent Labs    02/02/24 1703  COLORURINE YELLOW*  LABSPEC 1.014  PHURINE 5.0  GLUCOSEU NEGATIVE  HGBUR NEGATIVE  BILIRUBINUR NEGATIVE  KETONESUR NEGATIVE  PROTEINUR 30*  NITRITE NEGATIVE  LEUKOCYTESUR NEGATIVE      Imaging: CT ABDOMEN PELVIS WO CONTRAST Result Date: 02/02/2024 CLINICAL DATA:  Altered mental status. EXAM: CT ABDOMEN AND PELVIS WITHOUT CONTRAST TECHNIQUE: Multidetector CT imaging of the abdomen and pelvis was performed following the standard protocol without IV contrast. RADIATION DOSE REDUCTION: This exam was performed according to the departmental dose-optimization program which includes automated exposure control, adjustment of the mA and/or kV according to patient size and/or use of iterative reconstruction technique. COMPARISON:  November 29, 2023 FINDINGS: Lower chest: No acute  abnormality. Hepatobiliary: No focal liver abnormality is seen. No gallstones, gallbladder wall thickening, or biliary dilatation. Pancreas: Unremarkable. No pancreatic ductal dilatation or surrounding inflammatory changes. Spleen: Normal in size without focal abnormality. Adrenals/Urinary Tract: Adrenal glands are unremarkable. Kidneys are normal, without renal calculi, focal lesion, or hydronephrosis. The urinary bladder is moderately distended and is otherwise unremarkable. Stomach/Bowel: Stomach is within normal limits. Appendix appears normal. No evidence of bowel wall thickening, distention, or inflammatory changes. Vascular/Lymphatic: No  significant vascular findings are present. No enlarged abdominal or pelvic lymph nodes. Reproductive: Uterus and bilateral adnexa are unremarkable. Other: No abdominal wall hernia or abnormality. No abdominopelvic ascites. Musculoskeletal: No acute or significant osseous findings. IMPRESSION: No acute or active process within the abdomen or pelvis. Electronically Signed   By: Suzen Dials M.D.   On: 02/02/2024 15:02   CT HEAD WO CONTRAST ( ) Result Date: 02/02/2024 CLINICAL DATA:  Mental status changes.  Head trauma.  Ataxia. EXAM: CT HEAD WITHOUT CONTRAST CT CERVICAL SPINE WITHOUT CONTRAST TECHNIQUE: Multidetector CT imaging of the head and cervical spine was performed following the standard protocol without intravenous contrast. Multiplanar CT image reconstructions of the cervical spine were also generated. RADIATION DOSE REDUCTION: This exam was performed according to the departmental dose-optimization program which includes automated exposure control, adjustment of the mA and/or kV according to patient size and/or use of iterative reconstruction technique. COMPARISON:  01/30/2024 FINDINGS: CT HEAD FINDINGS Brain: There is no evidence for acute hemorrhage, hydrocephalus, mass lesion, or abnormal extra-axial fluid collection. No definite CT evidence for acute infarction. Vascular: No hyperdense vessel or unexpected calcification. Skull: No evidence for fracture. No worrisome lytic or sclerotic lesion. Sinuses/Orbits: Chronic mucosal disease noted maxillary, right sphenoid, frontal and ethmoid sinuses. Visualized portions of the globes and intraorbital fat are unremarkable. Other: None. CT CERVICAL SPINE FINDINGS Alignment: Normal. Skull base and vertebrae: No acute fracture. No primary bone lesion or focal pathologic process. Soft tissues and spinal canal: No prevertebral fluid or swelling. No visible canal hematoma. Disc levels:  Intervertebral disc spaces are preserved. Upper chest: No acute findings.  Other: None. IMPRESSION: 1. No acute intracranial abnormality. 2. Chronic paranasal sinusitis. 3. No cervical spine fracture or subluxation. Electronically Signed   By: Camellia Candle M.D.   On: 02/02/2024 13:34   CT Cervical Spine Wo Contrast Result Date: 02/02/2024 CLINICAL DATA:  Mental status changes.  Head trauma.  Ataxia. EXAM: CT HEAD WITHOUT CONTRAST CT CERVICAL SPINE WITHOUT CONTRAST TECHNIQUE: Multidetector CT imaging of the head and cervical spine was performed following the standard protocol without intravenous contrast. Multiplanar CT image reconstructions of the cervical spine were also generated. RADIATION DOSE REDUCTION: This exam was performed according to the departmental dose-optimization program which includes automated exposure control, adjustment of the mA and/or kV according to patient size and/or use of iterative reconstruction technique. COMPARISON:  01/30/2024 FINDINGS: CT HEAD FINDINGS Brain: There is no evidence for acute hemorrhage, hydrocephalus, mass lesion, or abnormal extra-axial fluid collection. No definite CT evidence for acute infarction. Vascular: No hyperdense vessel or unexpected calcification. Skull: No evidence for fracture. No worrisome lytic or sclerotic lesion. Sinuses/Orbits: Chronic mucosal disease noted maxillary, right sphenoid, frontal and ethmoid sinuses. Visualized portions of the globes and intraorbital fat are unremarkable. Other: None. CT CERVICAL SPINE FINDINGS Alignment: Normal. Skull base and vertebrae: No acute fracture. No primary bone lesion or focal pathologic process. Soft tissues and spinal canal: No prevertebral fluid or swelling. No visible canal hematoma. Disc levels:  Intervertebral disc spaces are  preserved. Upper chest: No acute findings. Other: None. IMPRESSION: 1. No acute intracranial abnormality. 2. Chronic paranasal sinusitis. 3. No cervical spine fracture or subluxation. Electronically Signed   By: Camellia Candle M.D.   On: 02/02/2024 13:34    DG Chest Portable 1 View Result Date: 02/02/2024 CLINICAL DATA:  Altered mental status. EXAM: PORTABLE CHEST 1 VIEW COMPARISON:  12/17/2021 FINDINGS: Low volume film. The cardio pericardial silhouette is enlarged. The lungs are clear without focal pneumonia, edema, pneumothorax or pleural effusion. No acute bony abnormality. Telemetry leads overlie the chest. IMPRESSION: No active disease. Electronically Signed   By: Camellia Candle M.D.   On: 02/02/2024 12:31     Medications:    lactated ringers  75 mL/hr at 02/04/24 0530    carbamazepine   200 mg Oral BID   Chlorhexidine  Gluconate Cloth  6 each Topical Q0600   clonazePAM   1 mg Oral BID   enoxaparin  (LOVENOX ) injection  0.5 mg/kg Subcutaneous Q24H   gabapentin   800 mg Oral TID   haloperidol   5 mg Oral QHS   heparin  5,000 Units Subcutaneous Q8H   insulin  aspart  0-5 Units Subcutaneous QHS   insulin  aspart  0-9 Units Subcutaneous TID WC   levothyroxine   175 mcg Oral Q0600   loratadine   10 mg Oral Daily   paliperidone   6 mg Oral QHS   propranolol   40 mg Oral BID   QUEtiapine   200 mg Oral QHS   acetaminophen  **OR** acetaminophen , hydrALAZINE, ondansetron  **OR** ondansetron  (ZOFRAN ) IV, oxyCODONE , senna-docusate  Assessment/ Plan:  Dawn Lambert is a 33 y.o.  female with past medical history of hypothyroidism, obesity, and schizophrenia, who was admitted to [facility] on 02/02/2024 for Hypovolemic shock (HCC) [R57.1] AKI (acute kidney injury) [N17.9] Lithium  toxicity, accidental or unintentional, initial encounter [T56.891A]   Acute kidney injury likely secondary to lithium  toxicity and mild rhabdomyolysis..  Baseline creatinine appears to be 1.0 on 12/13/2023.  Creatinine 2.91 on ED arrival.  CK 1800 on arrival, now 384.  Creatinine has improved to 1.37. Continue supportive measures.   Lab Results  Component Value Date   CREATININE 1.37 (H) 02/04/2024   CREATININE 1.95 (H) 02/03/2024   CREATININE 2.91 (H) 02/02/2024   No intake  or output data in the 24 hours ending 02/04/24 1110  2.  Acute lithium  toxicity lithium .  S lithium  Lambert 2.43 on admission.  Last Lambert 1.88.  Lithium  held.  Due to renal recovery, we will sign off at this time.    LOS: 2 Tiki Tucciarone 10/6/202511:10 AM

## 2024-02-04 NOTE — Plan of Care (Signed)
  Problem: Clinical Measurements: Goal: Ability to maintain clinical measurements within normal limits will improve Outcome: Progressing Goal: Will remain free from infection Outcome: Progressing   Problem: Elimination: Goal: Will not experience complications related to urinary retention Outcome: Progressing   Problem: Safety: Goal: Ability to remain free from injury will improve Outcome: Progressing

## 2024-02-04 NOTE — Progress Notes (Signed)
 PROGRESS NOTE    [name]   FMW:[id number] DOB: 1990/07/01  DOA: 02/02/2024 Date of Service: 02/04/24 which is hospital day 2  PCP: [facility] [place] course / significant events:   HPI: [name] is a 33 year old female with history of schizophrenia, intellectual disability who has been in a group home for 15 years, hypothyroid, morbid obesity, who presents ED from group home for chief concerns of altered mental status. Staff noted pt not eating or drinking and did not take her morning meds which are all her psych meds. Upon EMS arrival pt was lying on the floor. EMS reported pt was initially able to walk but then they had to get the stair chair because pt was not able to walk anymore to the stretcher. She reports that she had fallen several times. She reports she did hit her head but denies loss of consciousness. She reports that her thighs and arms are hurting her.  She reports that this started happening after falling multiple times yesterday. She endorses dysuria that started several days ago.  10/04: to ED. CK 1830. Hypokalemia 3.0. Increase lithium  level 2.43 (1.2 is upper limit normal). Cr 2.91 and GFR 21. Imaging no concerns on CT head/Cspine or CT abd/pelv, CXR. Admitted for AKI, Rhabdomyolysis.  10/05:Cr 1.95 and GFR 34. CK improved 1009. Continuing on IV fluids.  10/06: Cr 1.65 and GFR 52. CK 384. Planning rehab placement. Lower BP, reduced propranolol . Continue to hold lithium . DC IV fluids, encourage po intake     Consultants:  none  Procedures/Surgeries: none      ASSESSMENT & PLAN:   AKI (acute kidney injury) - improving  Lithium  + NSAID likely contributing  Baseline normal renal function / GFR >60 Cr 2.91 on admission 10/04 --> 1.65 on 10/06 Dc IV fluids, encourage po intake Monitor BMP   Rhabdomyolysis d/t fall  Mild to moderate rhabdomyolysis Fluid hydration Monitor CK and renal fxn   Dysuria - normal  UA Dc abx     Uncontrolled type 2 diabetes mellitus with hyperglycemia, without long-term current use of insulin   A1C 6.9 Insulin  ssi with hs coverage Goal inpatient blood glucose is 140-180   Hypokalemia Normal Mg Monitor BMP Replace K as needed    Generalized anxiety disorder Schizoaffective disorder  Depression Mood disorder  Elevated serum lithium  level Borderline Personality D/o  Continue clonazepam  1 mg bid Continue Gabapentin  at 800 mg tid  Continue Carbamazepine  200 mg bid  Continue Haldol  5 mg at bedtime  Continue invega  6 mg at bedtime Continue Seroquel  200 mg at bedtime  Reduced propranolol  40 mg bid --> 10 mg bid d/t soft BP Home lithium  held d/t elevated levels    Hypothyroidism Continue levothyroxine  175 mcg daily    Hypertension Hydralazine 5 mg IV every 6 hours as needed for SBP greater 170, days ordered Home lisinopril  10 mg daily not resumed on admission in setting of acute kidney injury and soft BP Continue propranolol  but reduced to 10 mg bid       Class 3 obesity based on BMI: Body mass index is 48.79 kg/m.SABRA Significantly low or high BMI is associated with higher medical risk.  Underweight - under 18  overweight - 25 to 29 obese - 30 or more Class 1 obesity: BMI of 30.0 to 34 Class 2 obesity: BMI of 35.0 to 39 Class 3 obesity: BMI of 40.0 to 49 Super Morbid Obesity: BMI 50-59 Super-super Morbid Obesity: BMI 60+ Healthy  nutrition and physical activity advised as adjunct to other disease management and risk reduction treatments    DVT prophylaxis: lovenox  IV fluids: off  Nutrition: carb modified diet  Central lines / other devices: none  Code Status: FULL CODE ACP documentation reviewed:  none on file in [facility]  [facility] needs: SNF rehab  Medical barriers to dispo: AKI, rhabdo. Expected medical readiness for discharge tomorrow if doing well off IV fluids                Subjective / Brief ROS:  Patient reports feeling okay  today other than tops of her feet are hurting and legs are hurting, chronic.    Family Communication: call mom 02/04/24 12:56 PM all questions answered, she is ok for pt to go to SNF rehab but asks we confer w/ her group home folks to make sure no issues w/ her funding / bed at the home     Objective Findings:  Vitals:   02/03/24 1949 02/03/24 2026 02/04/24 0436 02/04/24 0753  BP: (!) 74/52 128/73 126/64 (!) 99/51  Pulse: 72 67 68 (!) 58  Resp: 18   19  Temp: 98.2 F (36.8 C)  98.7 F (37.1 C) 98.2 F (36.8 C)  TempSrc: Oral  Oral   SpO2: 100%  100% 98%  Weight:      Height:        Intake/Output Summary (Last 24 hours) at 02/04/2024 1256 Last data filed at 02/04/2024 1100 Gross per 24 hour  Intake --  Output 700 ml  Net -700 ml   Filed Weights   02/02/24 1557  Weight: 121 kg    Examination:  Physical Exam Constitutional:      General: She is not in acute distress. Cardiovascular:     Rate and Rhythm: Normal rate and regular rhythm.  Pulmonary:     Effort: Pulmonary effort is normal.     Breath sounds: Normal breath sounds.  Abdominal:     Palpations: Abdomen is soft.  Musculoskeletal:     Right lower leg: No edema.     Left lower leg: No edema.  Skin:    General: Skin is warm and dry.  Neurological:     General: No focal deficit present.     Mental Status: She is alert. Mental status is at baseline.  Psychiatric:        Mood and Affect: Mood normal.        Behavior: Behavior normal.          Scheduled Medications:   carbamazepine   200 mg Oral BID   Chlorhexidine  Gluconate Cloth  6 each Topical Q0600   clonazePAM   1 mg Oral BID   enoxaparin  (LOVENOX ) injection  0.5 mg/kg Subcutaneous Q24H   gabapentin   800 mg Oral TID   haloperidol   5 mg Oral QHS   heparin  5,000 Units Subcutaneous Q8H   insulin  aspart  0-5 Units Subcutaneous QHS   insulin  aspart  0-9 Units Subcutaneous TID WC   levothyroxine   175 mcg Oral Q0600   loratadine   10 mg Oral Daily    paliperidone   6 mg Oral QHS   propranolol   10 mg Oral BID   QUEtiapine   200 mg Oral QHS    Continuous Infusions:    PRN Medications:  acetaminophen  **OR** acetaminophen , hydrALAZINE, ondansetron  **OR** ondansetron  (ZOFRAN ) IV, oxyCODONE , senna-docusate  Antimicrobials from admission:  Anti-infectives (From admission, onward)    None           Data  Reviewed:  I have personally reviewed the following...  CBC: Recent Labs  Lab 02/02/24 1140 02/03/24 0512  WBC 14.9* 11.3*  NEUTROABS 12.6*  --   HGB 10.7* 9.6*  HCT 34.0* 30.7*  MCV 93.7 94.8  PLT 278 165   Basic Metabolic Panel: Recent Labs  Lab 02/02/24 1140 02/02/24 1645 02/03/24 0512 02/04/24 0824  NA 140  --  141 143  K 3.0*  --  3.0* 3.4*  CL 100  --  105 104  CO2 26  --  26 27  GLUCOSE 159*  --  160* 183*  BUN 25*  --  19 14  CREATININE 2.91*  --  1.95* 1.37*  CALCIUM  9.1  --  8.9 9.7  MG  --  2.3  --   --    GFR: Estimated Creatinine Clearance: 72.9 mL/min (A) (by C-G formula based on SCr of 1.37 mg/dL (H)). Liver Function Tests: Recent Labs  Lab 02/02/24 1140  AST 55*  ALT 22  ALKPHOS 76  BILITOT 0.9  PROT 6.6  ALBUMIN 3.0*   No results for input(s): LIPASE, AMYLASE in the last 168 hours. Recent Labs  Lab 02/02/24 1140  AMMONIA 18   Coagulation Profile: No results for input(s): INR, PROTIME in the last 168 hours. Cardiac Enzymes: Recent Labs  Lab 02/02/24 1140 02/03/24 0512 02/04/24 0824  CKTOTAL 1,830* 1,009* 384*   BNP (last 3 results) No results for input(s): PROBNP in the last 8760 hours. HbA1C: Recent Labs    02/03/24 0512  HGBA1C 6.9*   CBG: Recent Labs  Lab 02/03/24 1205 02/03/24 1736 02/03/24 1949 02/04/24 0754 02/04/24 1118  GLUCAP 262* 186* 175* 162* 204*   Lipid Profile: No results for input(s): CHOL, HDL, LDLCALC, TRIG, CHOLHDL, LDLDIRECT in the last 72 hours. Thyroid  Function Tests: No results for input(s): TSH,  T4TOTAL, FREET4, T3FREE, THYROIDAB in the last 72 hours. Anemia Panel: No results for input(s): VITAMINB12, FOLATE, FERRITIN, TIBC, IRON, RETICCTPCT in the last 72 hours. Most Recent Urinalysis On File:     Component Value Date/Time   COLORURINE YELLOW (A) 02/02/2024 1703   APPEARANCEUR HAZY (A) 02/02/2024 1703   APPEARANCEUR Clear 07/10/2013 1629   LABSPEC 1.014 02/02/2024 1703   LABSPEC 1.013 07/10/2013 1629   PHURINE 5.0 02/02/2024 1703   GLUCOSEU NEGATIVE 02/02/2024 1703   GLUCOSEU Negative 07/10/2013 1629   HGBUR NEGATIVE 02/02/2024 1703   BILIRUBINUR NEGATIVE 02/02/2024 1703   BILIRUBINUR Negative 07/10/2013 1629   KETONESUR NEGATIVE 02/02/2024 1703   PROTEINUR 30 (A) 02/02/2024 1703   NITRITE NEGATIVE 02/02/2024 1703   LEUKOCYTESUR NEGATIVE 02/02/2024 1703   LEUKOCYTESUR Negative 07/10/2013 1629   Sepsis Labs: @LABRCNTIP (procalcitonin:4,lacticidven:4) Microbiology: Recent Results (from the past 240 hours)  Resp panel by RT-PCR (RSV, Flu A&B, Covid) Anterior Nasal Swab     Status: None   Collection Time: 02/02/24 12:23 PM   Specimen: Anterior Nasal Swab  Result Value Ref Range Status   SARS Coronavirus 2 by RT PCR NEGATIVE NEGATIVE Final    Comment: (NOTE) SARS-CoV-2 target nucleic acids are NOT DETECTED.  The SARS-CoV-2 RNA is generally detectable in upper respiratory specimens during the acute phase of infection. The lowest concentration of SARS-CoV-2 viral copies this assay can detect is 138 copies/mL. A negative result does not preclude SARS-Cov-2 infection and should not be used as the sole basis for treatment or other patient management decisions. A negative result may occur with  improper specimen collection/handling, submission of specimen other than nasopharyngeal swab, presence of  viral mutation(s) within the areas targeted by this assay, and inadequate number of viral copies(<138 copies/mL). A negative result must be combined  with clinical observations, patient history, and epidemiological information. The expected result is Negative.  Fact Sheet for Patients:  BloggerCourse.com  Fact Sheet for Healthcare Providers:  SeriousBroker.it  This test is no t yet approved or cleared by the [place]  FDA and  has been authorized for detection and/or diagnosis of SARS-CoV-2 by FDA under an Emergency Use Authorization (EUA). This EUA will remain  in effect (meaning this test can be used) for the duration of the COVID-19 declaration under Section 564(b)(1) of the Act, 21 U.S.C.section 360bbb-3(b)(1), unless the authorization is terminated  or revoked sooner.       Influenza A by PCR NEGATIVE NEGATIVE Final   Influenza B by PCR NEGATIVE NEGATIVE Final    Comment: (NOTE) The Xpert Xpress SARS-CoV-2/FLU/RSV plus assay is intended as an aid in the diagnosis of influenza from Nasopharyngeal swab specimens and should not be used as a sole basis for treatment. Nasal washings and aspirates are unacceptable for Xpert Xpress SARS-CoV-2/FLU/RSV testing.  Fact Sheet for Patients: BloggerCourse.com  Fact Sheet for Healthcare Providers: SeriousBroker.it  This test is not yet approved or cleared by the [place]  FDA and has been authorized for detection and/or diagnosis of SARS-CoV-2 by FDA under an Emergency Use Authorization (EUA). This EUA will remain in effect (meaning this test can be used) for the duration of the COVID-19 declaration under Section 564(b)(1) of the Act, 21 U.S.C. section 360bbb-3(b)(1), unless the authorization is terminated or revoked.     Resp Syncytial Virus by PCR NEGATIVE NEGATIVE Final    Comment: (NOTE) Fact Sheet for Patients: BloggerCourse.com  Fact Sheet for Healthcare Providers: SeriousBroker.it  This test is not yet approved  or cleared by the [place]  FDA and has been authorized for detection and/or diagnosis of SARS-CoV-2 by FDA under an Emergency Use Authorization (EUA). This EUA will remain in effect (meaning this test can be used) for the duration of the COVID-19 declaration under Section 564(b)(1) of the Act, 21 U.S.C. section 360bbb-3(b)(1), unless the authorization is terminated or revoked.  Performed at [facility], [address] [place], [name] [address]   MRSA Next Gen by PCR, Nasal     Status: None   Collection Time: 02/02/24  4:07 PM   Specimen: Nasal Mucosa; Nasal Swab  Result Value Ref Range Status   MRSA by PCR Next Gen NOT DETECTED NOT DETECTED Final    Comment: (NOTE) The GeneXpert MRSA Assay (FDA approved for NASAL specimens only), is one component of a comprehensive MRSA colonization surveillance program. It is not intended to diagnose MRSA infection nor to guide or monitor treatment for MRSA infections. Test performance is not FDA approved in patients less than 58 years old. Performed at [facility], [address], [place], [name] [address]       Radiology Studies last 3 days: CT ABDOMEN PELVIS WO CONTRAST Result Date: 02/02/2024 CLINICAL DATA:  Altered mental status. EXAM: CT ABDOMEN AND PELVIS WITHOUT CONTRAST TECHNIQUE: Multidetector CT imaging of the abdomen and pelvis was performed following the standard protocol without IV contrast. RADIATION DOSE REDUCTION: This exam was performed according to the departmental dose-optimization program which includes automated exposure control, adjustment of the mA and/or kV according to patient size and/or use of iterative reconstruction technique. COMPARISON:  November 29, 2023 FINDINGS: Lower chest: No acute abnormality. Hepatobiliary: No focal liver abnormality is seen.  No gallstones, gallbladder wall thickening, or biliary dilatation. Pancreas: Unremarkable. No pancreatic ductal dilatation or surrounding  inflammatory changes. Spleen: Normal in size without focal abnormality. Adrenals/Urinary Tract: Adrenal glands are unremarkable. Kidneys are normal, without renal calculi, focal lesion, or hydronephrosis. The urinary bladder is moderately distended and is otherwise unremarkable. Stomach/Bowel: Stomach is within normal limits. Appendix appears normal. No evidence of bowel wall thickening, distention, or inflammatory changes. Vascular/Lymphatic: No significant vascular findings are present. No enlarged abdominal or pelvic lymph nodes. Reproductive: Uterus and bilateral adnexa are unremarkable. Other: No abdominal wall hernia or abnormality. No abdominopelvic ascites. Musculoskeletal: No acute or significant osseous findings. IMPRESSION: No acute or active process within the abdomen or pelvis. Electronically Signed   By: Suzen Dials M.D.   On: 02/02/2024 15:02   CT HEAD WO CONTRAST ( ) Result Date: 02/02/2024 CLINICAL DATA:  Mental status changes.  Head trauma.  Ataxia. EXAM: CT HEAD WITHOUT CONTRAST CT CERVICAL SPINE WITHOUT CONTRAST TECHNIQUE: Multidetector CT imaging of the head and cervical spine was performed following the standard protocol without intravenous contrast. Multiplanar CT image reconstructions of the cervical spine were also generated. RADIATION DOSE REDUCTION: This exam was performed according to the departmental dose-optimization program which includes automated exposure control, adjustment of the mA and/or kV according to patient size and/or use of iterative reconstruction technique. COMPARISON:  01/30/2024 FINDINGS: CT HEAD FINDINGS Brain: There is no evidence for acute hemorrhage, hydrocephalus, mass lesion, or abnormal extra-axial fluid collection. No definite CT evidence for acute infarction. Vascular: No hyperdense vessel or unexpected calcification. Skull: No evidence for fracture. No worrisome lytic or sclerotic lesion. Sinuses/Orbits: Chronic mucosal disease noted maxillary,  right sphenoid, frontal and ethmoid sinuses. Visualized portions of the globes and intraorbital fat are unremarkable. Other: None. CT CERVICAL SPINE FINDINGS Alignment: Normal. Skull base and vertebrae: No acute fracture. No primary bone lesion or focal pathologic process. Soft tissues and spinal canal: No prevertebral fluid or swelling. No visible canal hematoma. Disc levels:  Intervertebral disc spaces are preserved. Upper chest: No acute findings. Other: None. IMPRESSION: 1. No acute intracranial abnormality. 2. Chronic paranasal sinusitis. 3. No cervical spine fracture or subluxation. Electronically Signed   By: Camellia Candle M.D.   On: 02/02/2024 13:34   CT Cervical Spine Wo Contrast Result Date: 02/02/2024 CLINICAL DATA:  Mental status changes.  Head trauma.  Ataxia. EXAM: CT HEAD WITHOUT CONTRAST CT CERVICAL SPINE WITHOUT CONTRAST TECHNIQUE: Multidetector CT imaging of the head and cervical spine was performed following the standard protocol without intravenous contrast. Multiplanar CT image reconstructions of the cervical spine were also generated. RADIATION DOSE REDUCTION: This exam was performed according to the departmental dose-optimization program which includes automated exposure control, adjustment of the mA and/or kV according to patient size and/or use of iterative reconstruction technique. COMPARISON:  01/30/2024 FINDINGS: CT HEAD FINDINGS Brain: There is no evidence for acute hemorrhage, hydrocephalus, mass lesion, or abnormal extra-axial fluid collection. No definite CT evidence for acute infarction. Vascular: No hyperdense vessel or unexpected calcification. Skull: No evidence for fracture. No worrisome lytic or sclerotic lesion. Sinuses/Orbits: Chronic mucosal disease noted maxillary, right sphenoid, frontal and ethmoid sinuses. Visualized portions of the globes and intraorbital fat are unremarkable. Other: None. CT CERVICAL SPINE FINDINGS Alignment: Normal. Skull base and vertebrae: No  acute fracture. No primary bone lesion or focal pathologic process. Soft tissues and spinal canal: No prevertebral fluid or swelling. No visible canal hematoma. Disc levels:  Intervertebral disc spaces are preserved. Upper chest: No acute findings. Other:  None. IMPRESSION: 1. No acute intracranial abnormality. 2. Chronic paranasal sinusitis. 3. No cervical spine fracture or subluxation. Electronically Signed   By: Camellia Candle M.D.   On: 02/02/2024 13:34   DG Chest Portable 1 View Result Date: 02/02/2024 CLINICAL DATA:  Altered mental status. EXAM: PORTABLE CHEST 1 VIEW COMPARISON:  12/17/2021 FINDINGS: Low volume film. The cardio pericardial silhouette is enlarged. The lungs are clear without focal pneumonia, edema, pneumothorax or pleural effusion. No acute bony abnormality. Telemetry leads overlie the chest. IMPRESSION: No active disease. Electronically Signed   By: Camellia Candle M.D.   On: 02/02/2024 12:31       Time spent: 50 min     Lilith Solana, DO [facility] 02/04/2024, 12:56 PM    Dictation software may have been used to generate the above note. Typos may occur and escape review in typed/dictated notes. Please contact Dr Marsa directly for clarity if needed.  Staff may message me via secure chat in Epic  but this may not receive an immediate response,  please page me for urgent matters!  If 7PM-7AM, please contact night coverage [email or web address]

## 2024-02-04 NOTE — Progress Notes (Addendum)
 Physical Therapy Treatment Patient Details Name: Dawn Lambert MRN: [id number] DOB: Sep 15, 1990 Today's Date: 02/04/2024   History of Present Illness Dawn Lambert is a 33 year old female with history of schizophrenia, intellectual disability who has been in a group home for 15 years, hypothyroid, morbid obesity, who presents ED from group home for chief concerns of altered mental status, admitted for AKI.    PT Comments  Patient seen for PT session focused on transfers and OOB mobility at bedside. Patient required modA for OOB mobility to stand with RW. Pt able to transfer to chair with RW minA and was able to take 10 steps forward/backwards walking. Pt demonstrated significant unsteadiness with standing and forward/backwards stepping. Main limiting factors today were significant lower extremity weakness and unsteadiness. Interventions aimed at improving functional strength and tolerance for functional standing and walking. Continued skilled PT recommended to progress toward functional goals and support discharge readiness.     If plan is discharge home, recommend the following: Two people to help with walking and/or transfers;A lot of help with bathing/dressing/bathroom   Can travel by private vehicle     No  Equipment Recommendations  Rolling walker (2 wheels)    Recommendations for Other Services       Precautions / Restrictions Restrictions Weight Bearing Restrictions Per Provider Order: No     Mobility  Bed Mobility Overal bed mobility: Needs Assistance Bed Mobility: Rolling, Sidelying to Sit Rolling: Min assist Sidelying to sit: Min assist       General bed mobility comments: vc for movement sequence; physical assistance to LE and manage bed covers    Transfers Overall transfer level: Needs assistance Equipment used: 1 person hand held assist, Rolling walker (2 wheels) Transfers: Sit to/from Stand Sit to Stand: Min assist                 Ambulation/Gait Ambulation/Gait assistance: Min assist Gait Distance (Feet): 10 Feet Assistive device: Rolling walker (2 wheels) Gait Pattern/deviations: Step-to pattern, Decreased stride length           Stairs             Wheelchair Mobility     Tilt Bed    Modified Rankin (Stroke Patients Only)       Balance Overall balance assessment: Needs assistance Sitting-balance support: Single extremity supported, Feet supported Sitting balance-Leahy Scale: Normal     Standing balance support: During functional activity Standing balance-Leahy Scale: Fair Standing balance comment: able to sit to stand x 5 minA; requires constant vc to keep standing; very unsteady with standing at bedside                            Communication Communication Communication: No apparent difficulties  Cognition Arousal: Alert Behavior During Therapy: WFL for tasks assessed/performed   PT - Cognitive impairments: History of cognitive impairments, Initiation                         Following commands: Intact      Cueing Cueing Techniques: Verbal cues, Tactile cues  Exercises      General Comments        Pertinent Vitals/Pain Pain Assessment Pain Assessment: No/denies pain    Home Living Family/patient expects to be discharged to:: Group home                        Prior Function  PT Goals (current goals can now be found in the care plan section) Acute Rehab PT Goals Patient Stated Goal: Pt gets stronger PT Goal Formulation: With patient/family Time For Goal Achievement: 02/17/24 Potential to Achieve Goals: Good    Frequency    Min 3X/week      PT Plan      Co-evaluation              AM-PAC PT 6 Clicks Mobility   Outcome Measure  Help needed turning from your back to your side while in a flat bed without using bedrails?: A Little Help needed moving from lying on your back to sitting on the side of a  flat bed without using bedrails?: A Little Help needed moving to and from a bed to a chair (including a wheelchair)?: A Lot Help needed standing up from a chair using your arms (e.g., wheelchair or bedside chair)?: A Lot Help needed to walk in hospital room?: Total Help needed climbing 3-5 steps with a railing? : Total 6 Click Score: 12    End of Session Equipment Utilized During Treatment: Gait belt Activity Tolerance: Other (comment) (reports mild dizziness; states that symptoms are better than previous visit) Patient left: in chair;with chair alarm set Nurse Communication: Mobility status PT Visit Diagnosis: Unsteadiness on feet (R26.81);Difficulty in walking, not elsewhere classified (R26.2);Muscle weakness (generalized) (M62.81);Dizziness and giddiness (R42)     Time: 9881-9861 PT Time Calculation (min) (ACUTE ONLY): 20 min  Charges:    $Therapeutic Activity: 8-22 mins PT General Charges $$ ACUTE PT VISIT: 1 Visit                     [name] DPT, PT     [name] 02/04/2024, 3:24 PM

## 2024-02-05 DIAGNOSIS — N179 Acute kidney failure, unspecified: Secondary | ICD-10-CM | POA: Diagnosis not present

## 2024-02-05 LAB — BASIC METABOLIC PANEL WITH GFR
Anion gap: 7 (ref 5–15)
BUN: 15 mg/dL (ref 6–20)
CO2: 28 mmol/L (ref 22–32)
Calcium: 9.4 mg/dL (ref 8.9–10.3)
Chloride: 104 mmol/L (ref 98–111)
Creatinine, Ser: 1.28 mg/dL — ABNORMAL HIGH (ref 0.44–1.00)
GFR, Estimated: 56 mL/min — ABNORMAL LOW (ref >60)
Glucose, Bld: 215 mg/dL — ABNORMAL HIGH (ref 70–99)
Potassium: 3.3 mmol/L — ABNORMAL LOW (ref 3.5–5.1)
Sodium: 139 mmol/L (ref 135–145)

## 2024-02-05 LAB — GLUCOSE, CAPILLARY
Glucose-Capillary: 227 mg/dL — ABNORMAL HIGH (ref 70–99)
Glucose-Capillary: 228 mg/dL — ABNORMAL HIGH (ref 70–99)
Glucose-Capillary: 184 mg/dL — ABNORMAL HIGH (ref 70–99)
Glucose-Capillary: 220 mg/dL — ABNORMAL HIGH (ref 70–99)

## 2024-02-05 LAB — CK: Total CK: 202 U/L (ref 38–234)

## 2024-02-05 MED ORDER — INSULIN GLARGINE 100 UNIT/ML ~~LOC~~ SOLN
10.0000 [IU] | Freq: Every day | SUBCUTANEOUS | Status: DC
  Administered 2024-02-05: 10 [IU] via SUBCUTANEOUS
  Filled 2024-02-05 (×2): qty 0.1

## 2024-02-05 NOTE — Inpatient Diabetes Management (Signed)
 Inpatient Diabetes Program Recommendations  AACE/ADA: New Consensus Statement on Inpatient Glycemic Control (2015)  Target Ranges:  Prepandial:   less than 140 mg/dL      Peak postprandial:   less than 180 mg/dL (1-2 hours)      Critically ill patients:  140 - 180 mg/dL   Lab Results  Component Value Date   GLUCAP 228 (H) 02/05/2024   HGBA1C 6.9 (H) 02/03/2024    Review of Glycemic Control  Latest Reference Range & Units 02/04/24 07:54 02/04/24 11:18 02/04/24 15:45 02/04/24 20:23 02/05/24 08:20 02/05/24 12:27  Glucose-Capillary 70 - 99 mg/dL 837 (H) 795 (H) 821 (H) 214 (H) 227 (H) 228 (H)  (H): Data is abnormally high  Diabetes history: DM2 Outpatient Diabetes medications: TRulicity  0.75 mg weekly Current orders for Inpatient glycemic control: Novolog  0-9 units TID and 0-5 units QHS  Inpatient Diabetes Program Recommendations:    If patient remains inpatient, please consider,  Lantus 12 units every day (0.1 units x 121 kg)  Thank you, Wyvonna Pinal, MSN, CDCES Diabetes Coordinator Inpatient Diabetes Program [phone] (team pager from 8a-5p)

## 2024-02-05 NOTE — Plan of Care (Signed)

## 2024-02-05 NOTE — Progress Notes (Signed)
 PROGRESS NOTE    Dawn Lambert   FMW:[id number] DOB: 28-Mar-1991  DOA: 02/02/2024 Date of Service: 02/05/24 which is hospital day 3  PCP: [facility] course / significant events:   HPI: [name] is a 33 year old female with history of schizophrenia, intellectual disability who has been in a group home for 15 years, hypothyroid, morbid obesity, who presents ED from group home for chief concerns of altered mental status. Staff noted pt not eating or drinking and did not take her morning meds which are all her psych meds. Upon EMS arrival pt was lying on the floor. EMS reported pt was initially able to walk but then they had to get the stair chair because pt was not able to walk anymore to the stretcher. She reports that she had fallen several times. She reports she did hit her head but denies loss of consciousness. She reports that her thighs and arms are hurting her.  She reports that this started happening after falling multiple times yesterday. She endorses dysuria that started several days ago.  10/04: to ED. CK 1830. Hypokalemia 3.0. Increase lithium  level 2.43 (1.2 is upper limit normal). Cr 2.91 and GFR 21. Imaging no concerns on CT head/Cspine or CT abd/pelv, CXR. Admitted for AKI, Rhabdomyolysis.  10/05:Cr 1.95 and GFR 34. CK improved 1009. Continuing on IV fluids.  10/06: Cr 1.65 and GFR 52. CK 384. Planning rehab placement. Lower BP, reduced propranolol . Continue to hold lithium . DC IV fluids, encourage po intake  10/07: renal function about back to normal. Medically stable, awaiting SNF rehab.     Consultants:  none  Procedures/Surgeries: none      ASSESSMENT & PLAN:   AKI (acute kidney injury) - improving  Lithium  + NSAID likely contributing  Baseline normal renal function / GFR >60 Cr 2.91 on admission 10/04 --> 1.28 on 10/07 Monitor BMP   Rhabdomyolysis d/t fall  Mild to moderate rhabdomyolysis - resolved Fluid  hydration Can stop montioring CK since back to normal   Dysuria - normal UA Dc abx     Uncontrolled type 2 diabetes mellitus with hyperglycemia, without long-term current use of insulin   A1C 6.9 Insulin  ssi with hs coverage added lantus also 02/05/24    Hypokalemia Normal Mg Monitor BMP Replace K as needed    Generalized anxiety disorder Schizoaffective disorder  Depression Mood disorder  Elevated serum lithium  level Borderline Personality D/o  Continue clonazepam  1 mg bid Continue Gabapentin  at 800 mg tid  Continue Carbamazepine  200 mg bid  Continue Haldol  5 mg at bedtime  Continue invega  6 mg at bedtime Continue Seroquel  200 mg at bedtime  Reduced propranolol  40 mg bid --> 10 mg bid d/t soft BP Home lithium  held d/t elevated levels --> follow outpatient   Hypothyroidism Continue levothyroxine  175 mcg daily    Hypertension Hydralazine 5 mg IV every 6 hours as needed for SBP greater 170, days ordered Home lisinopril  10 mg daily not resumed on admission in setting of acute kidney injury and soft BP Continue propranolol  but reduced to 10 mg bid       Class 3 obesity based on BMI: Body mass index is 48.79 kg/m.SABRA Significantly low or high BMI is associated with higher medical risk.  Underweight - under 18  overweight - 25 to 29 obese - 30 or more Class 1 obesity: BMI of 30.0 to 34 Class 2 obesity: BMI of 35.0 to 39 Class 3 obesity: BMI of 40.0  to 49 Super Morbid Obesity: BMI 50-59 Super-super Morbid Obesity: BMI 60+ Healthy nutrition and physical activity advised as adjunct to other disease management and risk reduction treatments    DVT prophylaxis: lovenox  IV fluids: off  Nutrition: carb modified diet  Central lines / other devices: none  Code Status: FULL CODE ACP documentation reviewed:  none on file in [facility]  TOC needs: SNF rehab  Medical barriers to dispo: none               Subjective / Brief ROS:  Patient has no complaints today   Tolerating diet Still unsteady on her feet on her way to/from the bathroom and needing help    Family Communication: will call mom later today    Objective Findings:  Vitals:   02/04/24 2040 02/05/24 0059 02/05/24 0444 02/05/24 0816  BP: 121/74 117/65 (!) 117/56 118/60  Pulse: 83 73 72 65  Resp: 20 19 19 18   Temp: 99.3 F (37.4 C) 97.8 F (36.6 C) 98.5 F (36.9 C) 98.4 F (36.9 C)  TempSrc:      SpO2: 100% 97% 99% 99%  Weight:      Height:        Intake/Output Summary (Last 24 hours) at 02/05/2024 1319 Last data filed at 02/05/2024 0900 Gross per 24 hour  Intake 2318.84 ml  Output 800 ml  Net 1518.84 ml   Filed Weights   02/02/24 1557  Weight: 121 kg    Examination:  Physical Exam Constitutional:      General: She is not in acute distress. Cardiovascular:     Rate and Rhythm: Normal rate and regular rhythm.  Pulmonary:     Effort: Pulmonary effort is normal.     Breath sounds: Normal breath sounds.  Abdominal:     Palpations: Abdomen is soft.  Musculoskeletal:     Right lower leg: No edema.     Left lower leg: No edema.  Skin:    General: Skin is warm and dry.  Neurological:     General: No focal deficit present.     Mental Status: She is alert. Mental status is at baseline.  Psychiatric:        Mood and Affect: Mood normal.        Behavior: Behavior normal.          Scheduled Medications:   carbamazepine   200 mg Oral BID   Chlorhexidine  Gluconate Cloth  6 each Topical Q0600   clonazePAM   1 mg Oral BID   enoxaparin  (LOVENOX ) injection  0.5 mg/kg Subcutaneous Q24H   gabapentin   800 mg Oral TID   haloperidol   5 mg Oral QHS   insulin  aspart  0-5 Units Subcutaneous QHS   insulin  aspart  0-9 Units Subcutaneous TID WC   insulin  glargine  10 Units Subcutaneous QHS   levothyroxine   175 mcg Oral Q0600   loratadine   10 mg Oral Daily   paliperidone   6 mg Oral QHS   propranolol   10 mg Oral BID   QUEtiapine   200 mg Oral QHS    Continuous  Infusions:    PRN Medications:  acetaminophen  **OR** acetaminophen , hydrALAZINE, ondansetron  **OR** ondansetron  (ZOFRAN ) IV, oxyCODONE , senna-docusate  Antimicrobials from admission:  Anti-infectives (From admission, onward)    None           Data Reviewed:  I have personally reviewed the following...  CBC: Recent Labs  Lab 02/02/24 1140 02/03/24 0512  WBC 14.9* 11.3*  NEUTROABS 12.6*  --   HGB  10.7* 9.6*  HCT 34.0* 30.7*  MCV 93.7 94.8  PLT 278 165   Basic Metabolic Panel: Recent Labs  Lab 02/02/24 1140 02/02/24 1645 02/03/24 0512 02/04/24 0824 02/05/24 0448  NA 140  --  141 143 139  K 3.0*  --  3.0* 3.4* 3.3*  CL 100  --  105 104 104  CO2 26  --  26 27 28   GLUCOSE 159*  --  160* 183* 215*  BUN 25*  --  19 14 15   CREATININE 2.91*  --  1.95* 1.37* 1.28*  CALCIUM  9.1  --  8.9 9.7 9.4  MG  --  2.3  --   --   --    GFR: Estimated Creatinine Clearance: 78 mL/min (A) (by C-G formula based on SCr of 1.28 mg/dL (H)). Liver Function Tests: Recent Labs  Lab 02/02/24 1140  AST 55*  ALT 22  ALKPHOS 76  BILITOT 0.9  PROT 6.6  ALBUMIN 3.0*   No results for input(s): LIPASE, AMYLASE in the last 168 hours. Recent Labs  Lab 02/02/24 1140  AMMONIA 18   Coagulation Profile: No results for input(s): INR, PROTIME in the last 168 hours. Cardiac Enzymes: Recent Labs  Lab 02/02/24 1140 02/03/24 0512 02/04/24 0824 02/05/24 0448  CKTOTAL 1,830* 1,009* 384* 202   BNP (last 3 results) No results for input(s): PROBNP in the last 8760 hours. HbA1C: Recent Labs    02/03/24 0512  HGBA1C 6.9*   CBG: Recent Labs  Lab 02/04/24 1118 02/04/24 1545 02/04/24 2023 02/05/24 0820 02/05/24 1227  GLUCAP 204* 178* 214* 227* 228*   Lipid Profile: No results for input(s): CHOL, HDL, LDLCALC, TRIG, CHOLHDL, LDLDIRECT in the last 72 hours. Thyroid  Function Tests: No results for input(s): TSH, T4TOTAL, FREET4, T3FREE, THYROIDAB in  the last 72 hours. Anemia Panel: No results for input(s): VITAMINB12, FOLATE, FERRITIN, TIBC, IRON, RETICCTPCT in the last 72 hours. Most Recent Urinalysis On File:     Component Value Date/Time   COLORURINE YELLOW (A) 02/02/2024 1703   APPEARANCEUR HAZY (A) 02/02/2024 1703   APPEARANCEUR Clear 07/10/2013 1629   LABSPEC 1.014 02/02/2024 1703   LABSPEC 1.013 07/10/2013 1629   PHURINE 5.0 02/02/2024 1703   GLUCOSEU NEGATIVE 02/02/2024 1703   GLUCOSEU Negative 07/10/2013 1629   HGBUR NEGATIVE 02/02/2024 1703   BILIRUBINUR NEGATIVE 02/02/2024 1703   BILIRUBINUR Negative 07/10/2013 1629   KETONESUR NEGATIVE 02/02/2024 1703   PROTEINUR 30 (A) 02/02/2024 1703   NITRITE NEGATIVE 02/02/2024 1703   LEUKOCYTESUR NEGATIVE 02/02/2024 1703   LEUKOCYTESUR Negative 07/10/2013 1629   Sepsis Labs: @LABRCNTIP (procalcitonin:4,lacticidven:4) Microbiology: Recent Results (from the past 240 hours)  Resp panel by RT-PCR (RSV, Flu A&B, Covid) Anterior Nasal Swab     Status: None   Collection Time: 02/02/24 12:23 PM   Specimen: Anterior Nasal Swab  Result Value Ref Range Status   SARS Coronavirus 2 by RT PCR NEGATIVE NEGATIVE Final    Comment: (NOTE) SARS-CoV-2 target nucleic acids are NOT DETECTED.  The SARS-CoV-2 RNA is generally detectable in upper respiratory specimens during the acute phase of infection. The lowest concentration of SARS-CoV-2 viral copies this assay can detect is 138 copies/mL. A negative result does not preclude SARS-Cov-2 infection and should not be used as the sole basis for treatment or other patient management decisions. A negative result may occur with  improper specimen collection/handling, submission of specimen other than nasopharyngeal swab, presence of viral mutation(s) within the areas targeted by this assay, and inadequate number of viral  copies(<138 copies/mL). A negative result must be combined with clinical observations, patient history, and  epidemiological information. The expected result is Negative.  Fact Sheet for Patients:  BloggerCourse.com  Fact Sheet for Healthcare Providers:  SeriousBroker.it  This test is no t yet approved or cleared by the [place]  FDA and  has been authorized for detection and/or diagnosis of SARS-CoV-2 by FDA under an Emergency Use Authorization (EUA). This EUA will remain  in effect (meaning this test can be used) for the duration of the COVID-19 declaration under Section 564(b)(1) of the Act, 21 U.S.C.section 360bbb-3(b)(1), unless the authorization is terminated  or revoked sooner.       Influenza A by PCR NEGATIVE NEGATIVE Final   Influenza B by PCR NEGATIVE NEGATIVE Final    Comment: (NOTE) The Xpert Xpress SARS-CoV-2/FLU/RSV plus assay is intended as an aid in the diagnosis of influenza from Nasopharyngeal swab specimens and should not be used as a sole basis for treatment. Nasal washings and aspirates are unacceptable for Xpert Xpress SARS-CoV-2/FLU/RSV testing.  Fact Sheet for Patients: BloggerCourse.com  Fact Sheet for Healthcare Providers: SeriousBroker.it  This test is not yet approved or cleared by the [place]  FDA and has been authorized for detection and/or diagnosis of SARS-CoV-2 by FDA under an Emergency Use Authorization (EUA). This EUA will remain in effect (meaning this test can be used) for the duration of the COVID-19 declaration under Section 564(b)(1) of the Act, 21 U.S.C. section 360bbb-3(b)(1), unless the authorization is terminated or revoked.     Resp Syncytial Virus by PCR NEGATIVE NEGATIVE Final    Comment: (NOTE) Fact Sheet for Patients: BloggerCourse.com  Fact Sheet for Healthcare Providers: SeriousBroker.it  This test is not yet approved or cleared by the [place]  FDA and has been  authorized for detection and/or diagnosis of SARS-CoV-2 by FDA under an Emergency Use Authorization (EUA). This EUA will remain in effect (meaning this test can be used) for the duration of the COVID-19 declaration under Section 564(b)(1) of the Act, 21 U.S.C. section 360bbb-3(b)(1), unless the authorization is terminated or revoked.  Performed at [facility], [address] [place], [name] [address]   MRSA Next Gen by PCR, Nasal     Status: None   Collection Time: 02/02/24  4:07 PM   Specimen: Nasal Mucosa; Nasal Swab  Result Value Ref Range Status   MRSA by PCR Next Gen NOT DETECTED NOT DETECTED Final    Comment: (NOTE) The GeneXpert MRSA Assay (FDA approved for NASAL specimens only), is one component of a comprehensive MRSA colonization surveillance program. It is not intended to diagnose MRSA infection nor to guide or monitor treatment for MRSA infections. Test performance is not FDA approved in patients less than 18 years old. Performed at [facility], [address], [place], [name] [address]       Radiology Studies last 3 days: CT ABDOMEN PELVIS WO CONTRAST Result Date: 02/02/2024 CLINICAL DATA:  Altered mental status. EXAM: CT ABDOMEN AND PELVIS WITHOUT CONTRAST TECHNIQUE: Multidetector CT imaging of the abdomen and pelvis was performed following the standard protocol without IV contrast. RADIATION DOSE REDUCTION: This exam was performed according to the departmental dose-optimization program which includes automated exposure control, adjustment of the mA and/or kV according to patient size and/or use of iterative reconstruction technique. COMPARISON:  November 29, 2023 FINDINGS: Lower chest: No acute abnormality. Hepatobiliary: No focal liver abnormality is seen. No gallstones, gallbladder wall thickening, or biliary dilatation. Pancreas: Unremarkable. No pancreatic ductal dilatation  or surrounding inflammatory changes. Spleen: Normal in size without focal  abnormality. Adrenals/Urinary Tract: Adrenal glands are unremarkable. Kidneys are normal, without renal calculi, focal lesion, or hydronephrosis. The urinary bladder is moderately distended and is otherwise unremarkable. Stomach/Bowel: Stomach is within normal limits. Appendix appears normal. No evidence of bowel wall thickening, distention, or inflammatory changes. Vascular/Lymphatic: No significant vascular findings are present. No enlarged abdominal or pelvic lymph nodes. Reproductive: Uterus and bilateral adnexa are unremarkable. Other: No abdominal wall hernia or abnormality. No abdominopelvic ascites. Musculoskeletal: No acute or significant osseous findings. IMPRESSION: No acute or active process within the abdomen or pelvis. Electronically Signed   By: Suzen Dials M.D.   On: 02/02/2024 15:02   CT HEAD WO CONTRAST ( ) Result Date: 02/02/2024 CLINICAL DATA:  Mental status changes.  Head trauma.  Ataxia. EXAM: CT HEAD WITHOUT CONTRAST CT CERVICAL SPINE WITHOUT CONTRAST TECHNIQUE: Multidetector CT imaging of the head and cervical spine was performed following the standard protocol without intravenous contrast. Multiplanar CT image reconstructions of the cervical spine were also generated. RADIATION DOSE REDUCTION: This exam was performed according to the departmental dose-optimization program which includes automated exposure control, adjustment of the mA and/or kV according to patient size and/or use of iterative reconstruction technique. COMPARISON:  01/30/2024 FINDINGS: CT HEAD FINDINGS Brain: There is no evidence for acute hemorrhage, hydrocephalus, mass lesion, or abnormal extra-axial fluid collection. No definite CT evidence for acute infarction. Vascular: No hyperdense vessel or unexpected calcification. Skull: No evidence for fracture. No worrisome lytic or sclerotic lesion. Sinuses/Orbits: Chronic mucosal disease noted maxillary, right sphenoid, frontal and ethmoid sinuses. Visualized  portions of the globes and intraorbital fat are unremarkable. Other: None. CT CERVICAL SPINE FINDINGS Alignment: Normal. Skull base and vertebrae: No acute fracture. No primary bone lesion or focal pathologic process. Soft tissues and spinal canal: No prevertebral fluid or swelling. No visible canal hematoma. Disc levels:  Intervertebral disc spaces are preserved. Upper chest: No acute findings. Other: None. IMPRESSION: 1. No acute intracranial abnormality. 2. Chronic paranasal sinusitis. 3. No cervical spine fracture or subluxation. Electronically Signed   By: Camellia Candle M.D.   On: 02/02/2024 13:34   CT Cervical Spine Wo Contrast Result Date: 02/02/2024 CLINICAL DATA:  Mental status changes.  Head trauma.  Ataxia. EXAM: CT HEAD WITHOUT CONTRAST CT CERVICAL SPINE WITHOUT CONTRAST TECHNIQUE: Multidetector CT imaging of the head and cervical spine was performed following the standard protocol without intravenous contrast. Multiplanar CT image reconstructions of the cervical spine were also generated. RADIATION DOSE REDUCTION: This exam was performed according to the departmental dose-optimization program which includes automated exposure control, adjustment of the mA and/or kV according to patient size and/or use of iterative reconstruction technique. COMPARISON:  01/30/2024 FINDINGS: CT HEAD FINDINGS Brain: There is no evidence for acute hemorrhage, hydrocephalus, mass lesion, or abnormal extra-axial fluid collection. No definite CT evidence for acute infarction. Vascular: No hyperdense vessel or unexpected calcification. Skull: No evidence for fracture. No worrisome lytic or sclerotic lesion. Sinuses/Orbits: Chronic mucosal disease noted maxillary, right sphenoid, frontal and ethmoid sinuses. Visualized portions of the globes and intraorbital fat are unremarkable. Other: None. CT CERVICAL SPINE FINDINGS Alignment: Normal. Skull base and vertebrae: No acute fracture. No primary bone lesion or focal pathologic  process. Soft tissues and spinal canal: No prevertebral fluid or swelling. No visible canal hematoma. Disc levels:  Intervertebral disc spaces are preserved. Upper chest: No acute findings. Other: None. IMPRESSION: 1. No acute intracranial abnormality. 2. Chronic paranasal sinusitis. 3. No cervical  spine fracture or subluxation. Electronically Signed   By: Camellia Candle M.D.   On: 02/02/2024 13:34   DG Chest Portable 1 View Result Date: 02/02/2024 CLINICAL DATA:  Altered mental status. EXAM: PORTABLE CHEST 1 VIEW COMPARISON:  12/17/2021 FINDINGS: Low volume film. The cardio pericardial silhouette is enlarged. The lungs are clear without focal pneumonia, edema, pneumothorax or pleural effusion. No acute bony abnormality. Telemetry leads overlie the chest. IMPRESSION: No active disease. Electronically Signed   By: Camellia Candle M.D.   On: 02/02/2024 12:31        Dawn Giarratano, DO [facility] 02/05/2024, 1:19 PM    Dictation software may have been used to generate the above note. Typos may occur and escape review in typed/dictated notes. Please contact Dr Marsa directly for clarity if needed.  Staff may message me via secure chat in Epic  but this may not receive an immediate response,  please page me for urgent matters!  If 7PM-7AM, please contact night coverage [email or web address]

## 2024-02-05 NOTE — TOC Initial Note (Addendum)
 Transition of Care [facility]) - Initial/Assessment Note    Patient Details  Name: Dawn Lambert MRN: [id number] Date of Birth: 1990/12/19  [facility]) CM/SW Contact:    Dalia GORMAN Fuse, RN Phone Number: 02/05/2024, 1:49 PM  Clinical Narrative:                  Patient is from [facility]. TOC spoke with her caregiver and they can accept Basya back with [facility] PT/OT. TOC also spoke with the patient's mother and she is concerned that if Iza goes to a SNF she will just lie in bed and not socialize. Shatisha is very familiar with the group home and they have a routine. TOC sent message to Hospitalist to request she place a call to Tulani's mother at her convenience.  TOC has a conference call with Tammy and Grayce from [facility] and the patient's mom. There preference is for the patient to return to the group home. The only concern is clarifying which tasks require 2 person assist. They can not provide 24/7 2 person assist. TOC outreached to PT for clarification, they will see patient again today.   Expected Discharge Plan: Group Home Barriers to Discharge: Continued Medical Work up   Patient Goals and CMS Choice            Expected Discharge Plan and Services       Living arrangements for the past 2 months: Group Home                                      Prior Living Arrangements/Services Living arrangements for the past 2 months: Group Home Lives with:: Facility Resident                   Activities of Daily Living   ADL Screening (condition at time of admission) Independently performs ADLs?: Yes (appropriate for developmental age) Is the patient deaf or have difficulty hearing?: No Does the patient have difficulty seeing, even when wearing glasses/contacts?: No Does the patient have difficulty concentrating, remembering, or making decisions?: No  Permission Sought/Granted                  Emotional  Assessment              Admission diagnosis:  Hypovolemic shock (HCC) [R57.1] AKI (acute kidney injury) [N17.9] Lithium  toxicity, accidental or unintentional, initial encounter [T56.891A] Patient Active Problem List   Diagnosis Date Noted   Hypokalemia 02/02/2024   Rhabdomyolysis 02/02/2024   Dysuria 02/02/2024   Borderline personality disorder (HCC) 10/28/2023   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   Oppositional defiant disorder 09/27/2023   Homicidal ideations 09/27/2023   Generalized anxiety disorder 01/08/2023   Episodic mood disorder 01/08/2023   Snoring 12/04/2022   Witnessed episode of apnea 12/04/2022   Sepsis without acute organ dysfunction (HCC) 10/10/2022   Acute gastroenteritis 10/09/2022   Obesity, Class III, BMI 40-49.9 (morbid obesity) (HCC) 10/09/2022   Urinary tract infection 10/09/2022   AKI (acute kidney injury) 10/09/2022   Hypertension 10/09/2022   Uncontrolled type 2 diabetes mellitus with hyperglycemia, without long-term current use of insulin  (HCC) 10/09/2022   Hypothyroidism 10/09/2022   Joint pain 10/09/2022   Behavior concern    Schizoaffective disorder (HCC)    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   Suicidal ideation 09/19/2020   Intellectual disability 10/12/2017  Umbilical hernia without obstruction and without gangrene 08/15/2017   Chest pain 11/05/2012   Atrial septal defect 09/19/2011   PCP:  [facility]:   [facility] - [place], [name] - [address] [name] [address] Phone: [phone] Fax: [phone]  [facility] - [place], [name] - [address] [name] [address] Phone: [phone] Fax: [phone]     Social Drivers of Health (SDOH) Social History: SDOH Screenings   Food Insecurity: No Food Insecurity (02/02/2024)  Housing: High Risk  (02/02/2024)  Transportation Needs: No Transportation Needs (02/02/2024)  Utilities: Not At Risk (02/02/2024)  Depression (PHQ2-9): Low Risk  (07/23/2023)  Tobacco Use: Low Risk  (02/02/2024)   SDOH Interventions:     Readmission Risk Interventions     No data to display

## 2024-02-05 NOTE — Care Management Important Message (Signed)
 Important Message  Patient Details  Name: Dawn Lambert MRN: [id number] Date of Birth: 1990/09/12   Important Message Given:  Yes - Medicare IM     Ayse Mccartin W, CMA 02/05/2024, 11:32 AM

## 2024-02-05 NOTE — Progress Notes (Signed)
 Consult placed for new PIV placement. At this time pt has no medication or other orders requiring PIV access. Discussed with primary RN through secure chat and at bedside. Primary RN to consult IV team if pt needs change and PIV access is needed.

## 2024-02-06 DIAGNOSIS — W19XXXA Unspecified fall, initial encounter: Secondary | ICD-10-CM | POA: Diagnosis not present

## 2024-02-06 DIAGNOSIS — N179 Acute kidney failure, unspecified: Secondary | ICD-10-CM | POA: Diagnosis not present

## 2024-02-06 DIAGNOSIS — Z23 Encounter for immunization: Secondary | ICD-10-CM | POA: Diagnosis present

## 2024-02-06 LAB — GLUCOSE, CAPILLARY
Glucose-Capillary: 218 mg/dL — ABNORMAL HIGH (ref 70–99)
Glucose-Capillary: 258 mg/dL — ABNORMAL HIGH (ref 70–99)

## 2024-02-06 LAB — BASIC METABOLIC PANEL WITH GFR
Anion gap: 9 (ref 5–15)
BUN: 16 mg/dL (ref 6–20)
CO2: 28 mmol/L (ref 22–32)
Calcium: 9.4 mg/dL (ref 8.9–10.3)
Chloride: 104 mmol/L (ref 98–111)
Creatinine, Ser: 1.17 mg/dL — ABNORMAL HIGH (ref 0.44–1.00)
GFR, Estimated: >60 mL/min (ref >60)
Glucose, Bld: 221 mg/dL — ABNORMAL HIGH (ref 70–99)
Potassium: 3.5 mmol/L (ref 3.5–5.1)
Sodium: 141 mmol/L (ref 135–145)

## 2024-02-06 MED ORDER — LANCET DEVICE MISC: 1.0000 | Freq: Three times a day (TID) | 0 refills | Status: AC

## 2024-02-06 MED ORDER — BLOOD GLUCOSE TEST VI STRP: 1.0000 | ORAL_STRIP | Freq: Three times a day (TID) | 0 refills | Status: AC

## 2024-02-06 MED ORDER — PROPRANOLOL HCL 10 MG PO TABS: 10.0000 mg | ORAL_TABLET | Freq: Two times a day (BID) | ORAL | 2 refills | Status: AC

## 2024-02-06 MED ORDER — LANCETS MISC: 1.0000 | Freq: Three times a day (TID) | 0 refills | Status: AC

## 2024-02-06 MED ORDER — BLOOD GLUCOSE MONITORING SUPPL DEVI: 1.0000 | Freq: Three times a day (TID) | 0 refills | Status: AC

## 2024-02-06 NOTE — Progress Notes (Signed)
 TOC received call from Shaun with [facility]. They are unable to accept the patient, they are OON with [facility] [place] dual complete plans that begin with a 1. TOC sent HH referral in the Hub. [facility] declined, they cannot accept Psych patients and [facility] declined as well. TOC will continue to search for [facility] agencies.

## 2024-02-06 NOTE — Discharge Summary (Signed)
 Physician Discharge Summary   Dawn Lambert  female DOB: Apr 09, 1991  FMW:[id number]  PCP: [facility]  Admit date: 02/02/2024 Discharge date: 02/06/2024  Admitted From: group home Disposition:  group home Home Health: Yes CODE STATUS: Full code   Hospital Course:  For full details, please see H&P, progress notes, consult notes and ancillary notes.  Briefly,  Dawn Lambert is a 33 year old female with history of schizophrenia, intellectual disability who has been in a group home for 15 years, hypothyroid, morbid obesity, who presented ED from group home for chief concerns of altered mental status.   Staff noted pt not eating or drinking and did not take her morning meds which are all her psych meds.  Upon EMS arrival pt was lying on the floor.  She reported that she had fallen several times. She reports she did hit her head but denies loss of consciousness. She reports that her thighs and arms are hurting her.  She reports that this started happening after falling multiple times yesterday.   AKI - improving  Lithium  + NSAID likely contributing. --Cr 2.91 on presentation, improved with IVF, was 1.17 prior to discharge.   Rhabdomyolysis d/t fall  --CK 1830 on presentation, trended down with IVF.  Elevated lithium  level --2.43 on presentation.   --hold Lithium  pending outpatient f/u   Dysuria  - normal UA.  Abx d/c'ed.    type 2 diabetes mellitus with hyperglycemia, without long-term current use of insulin   A1C 6.9, well controlled --since A1c within goal, pt was discharged back on home diabetic regimen as below.  Hypertension --D/c'ed home lisinopril  --Reduce propranolol  from 40 to 10 BID   Hypokalemia --supplemented PRN   Generalized anxiety disorder Schizoaffective disorder  Depression Mood disorder  Elevated serum lithium  level Borderline Personality D/o  Continue clonazepam  1 mg bid Continue Gabapentin  at 800 mg tid  Continue  Carbamazepine  200 mg bid  Continue Haldol  5 mg at bedtime  Continue invega  6 mg at bedtime Continue Seroquel  200 mg at bedtime  Home lithium  held d/t elevated levels --> follow outpatient    Hypothyroidism Continue levothyroxine  175 mcg daily    Class 3 obesity based on BMI: Body mass index is 48.79 kg/m.   Discharge Diagnoses:  Principal Problem:   AKI (acute kidney injury) Active Problems:   Rhabdomyolysis   Dysuria   Uncontrolled type 2 diabetes mellitus with hyperglycemia, without long-term current use of insulin  (HCC)   Intellectual disability   Schizoaffective disorder (HCC)   Obesity, Class III, BMI 40-49.9 (morbid obesity) (HCC)   Hypertension   Hypothyroidism   Generalized anxiety disorder   Borderline personality disorder (HCC)   Hypokalemia   30 Day Unplanned Readmission Risk Score    Flowsheet Row ED to Hosp-Admission (Current) from 02/02/2024 in [facility] 1C MEDICAL TELEMETRY  30 Day Unplanned Readmission Risk Score (%) 23.8 Filed at 02/06/2024 0801    This score is the patient's risk of an unplanned readmission within 30 days of being discharged (0 -100%). The score is based on dignosis, age, lab data, medications, orders, and past utilization.   Low:  0-14.9   Medium: 15-21.9   High: 22-29.9   Extreme: 30 and above         Discharge Instructions:  Allergies as of 02/06/2024       Reactions   Amoxicillin-pot Clavulanate Other (See Comments)   Has patient had a PCN reaction causing immediate rash, facial/tongue/throat swelling, SOB or lightheadedness with hypotension:  ______ Has patient had a PCN reaction causing severe rash involving mucus membranes or skin necrosis: _______ Has patient had a PCN reaction that required hospitalization: _______ Has patient had a PCN reaction occurring within the last 10 years:_______ If all of the above answers are NO, then may proceed with Cephalosporin use. Other Reaction(s): Unknown Other  reaction(s): Other (See Comments)  Has patient had a PCN reaction causing immediate rash, facial/tongue/throat swelling, SOB or lightheadedness with hypotension: ______  Has patient had a PCN reaction causing severe rash involving mucus membranes or skin necrosis: _______  Has patient had a PCN reaction that required hospitalization: _______  Has patient had a PCN reaction occurring within the last 10 years:_______  If all of the above answers are NO, then may proceed with Cephalosporin use. Other Reaction(s): Chest Pain        Medication List     PAUSE taking these medications    lithium  carbonate 300 MG capsule Wait to take this until your doctor or other care provider tells you to start again. Hold until followup with outpatient provider due to elevated lithium  level. Take 300-600 mg by mouth 2 (two) times daily. Take 300 mg by mouth every morning 600 mg At bedtime       STOP taking these medications    ibuprofen  600 MG tablet Commonly known as: ADVIL    lisinopril  10 MG tablet Commonly known as: Zestril        TAKE these medications    acetaminophen  325 MG tablet Commonly known as: TYLENOL  Take 650 mg by mouth every 6 (six) hours as needed for mild pain or moderate pain.   albuterol  108 (90 Base) MCG/ACT inhaler Commonly known as: VENTOLIN  HFA Inhale 2 puffs into the lungs every 6 (six) hours as needed for wheezing or shortness of breath.   Blood Glucose Monitoring Suppl Devi 1 each by Does not apply route 3 (three) times daily. May dispense any manufacturer covered by patient's insurance.   BLOOD GLUCOSE TEST STRIPS Strp 1 each by Does not apply route 3 (three) times daily. Use as directed to check blood sugar. May dispense any manufacturer covered by patient's insurance and fits patient's device.   carbamazepine  200 MG 12 hr tablet Commonly known as: TEGretol -XR Take 1 tablet (200 mg total) by mouth 2 (two) times daily.   clonazePAM  1 MG tablet Commonly  known as: KlonoPIN  Take 1 tablet (1 mg total) by mouth 2 (two) times daily.   Dulaglutide  0.75 MG/0.5ML Soaj Inject 0.75 mg into the skin once a week. Wednesday   Fluocinolone  Acetonide Scalp 0.01 % Oil Apply to aa's scalp QD on Monday, Wednesday and Friday x 6 weeks. After 6 wks decrease to QW on Friday. May increase to QD on Monday, Wednesday, and Friday during times of flares.   fluticasone  50 MCG/ACT nasal spray Commonly known as: FLONASE  Place 1 spray into both nostrils daily as needed for allergies or rhinitis.   gabapentin  800 MG tablet Commonly known as: Neurontin  Take 1 tablet (800 mg total) by mouth 3 (three) times daily.   Guaifenesin  1200 MG Tb12 Take 1,200 mg by mouth daily as needed (Congestion).   haloperidol  5 MG tablet Commonly known as: HALDOL  Take 1 tablet (5 mg total) by mouth at bedtime.   Lancet Device Misc 1 each by Does not apply route 3 (three) times daily. May dispense any manufacturer covered by patient's insurance.   Lancets Misc 1 each by Does not apply route 3 (three) times daily. Use  as directed to check blood sugar. May dispense any manufacturer covered by patient's insurance and fits patient's device.   levothyroxine  175 MCG tablet Commonly known as: SYNTHROID  Take 1 tablet (175 mcg total) by mouth daily before breakfast.   lidocaine  2 % solution Commonly known as: XYLOCAINE  Use as directed 15 mLs in the mouth or throat as needed for mouth pain.   loratadine  10 MG tablet Commonly known as: Claritin  Take 1 tablet (10 mg total) by mouth daily.   nystatin  powder Apply 1 Application topically 2 (two) times daily as needed (fungal infection). Apply under breast   nystatin  powder Commonly known as: MYCOSTATIN /NYSTOP  Apply 1 Application topically 2 (two) times daily.   ondansetron  8 MG disintegrating tablet Commonly known as: ZOFRAN -ODT Take 8 mg by mouth 2 (two) times daily.   paliperidone  6 MG 24 hr tablet Commonly known as:  INVEGA  Take 6 mg by mouth at bedtime.   propranolol  10 MG tablet Commonly known as: INDERAL  Take 1 tablet (10 mg total) by mouth 2 (two) times daily. Reduced from 40 mg. What changed:  medication strength how much to take additional instructions   QUEtiapine  200 MG tablet Commonly known as: SEROQUEL  Take 200 mg by mouth at bedtime.   THEREMS PO Take 1 tablet by mouth daily. Take with food               Durable Medical Equipment  (From admission, onward)           Start     Ordered   02/06/24 1152  DME Glucometer  Once        02/06/24 1152   02/06/24 1019  For home use only DME Walker rolling  Once       Question Answer Comment  Walker: With 5 Inch Wheels   Patient needs a walker to treat with the following condition Weakness      02/06/24 1018   02/06/24 1019  For home use only DME Bedside commode  Once       Question:  Patient needs a bedside commode to treat with the following condition  Answer:  Weakness   02/06/24 1018             Follow-up Information     [facility] Follow up.   Specialty: General Practice Why: hospital follow up Contact information: [address] [name] [address] [phone]         [facility]  Follow up.   Why: Agency will call to schedule 1st PT/OT appointments. Contact information: [address] [name] [address] [phone]                 Allergies  Allergen Reactions   Amoxicillin-Pot Clavulanate Other (See Comments)    Has patient had a PCN reaction causing immediate rash, facial/tongue/throat swelling, SOB or lightheadedness with hypotension: ______  Has patient had a PCN reaction causing severe rash involving mucus membranes or skin necrosis: _______  Has patient had a PCN reaction that required hospitalization: _______  Has patient had a PCN reaction occurring within the last 10 years:_______  If all of the  above answers are NO, then may proceed with Cephalosporin use.  Other Reaction(s): Unknown  Other reaction(s): Other (See Comments)  Has patient had a PCN reaction causing immediate rash, facial/tongue/throat swelling, SOB or lightheadedness with hypotension: ______  Has patient had a PCN reaction causing severe rash involving mucus membranes or skin necrosis:  _______  Has patient had a PCN reaction that required hospitalization: _______  Has patient had a PCN reaction occurring within the last 10 years:_______  If all of the above answers are NO, then may proceed with Cephalosporin use.  Other Reaction(s): Chest Pain     The results of significant diagnostics from this hospitalization (including imaging, microbiology, ancillary and laboratory) are listed below for reference.   Consultations:   Procedures/Studies: CT ABDOMEN PELVIS WO CONTRAST Result Date: 02/02/2024 CLINICAL DATA:  Altered mental status. EXAM: CT ABDOMEN AND PELVIS WITHOUT CONTRAST TECHNIQUE: Multidetector CT imaging of the abdomen and pelvis was performed following the standard protocol without IV contrast. RADIATION DOSE REDUCTION: This exam was performed according to the departmental dose-optimization program which includes automated exposure control, adjustment of the mA and/or kV according to patient size and/or use of iterative reconstruction technique. COMPARISON:  November 29, 2023 FINDINGS: Lower chest: No acute abnormality. Hepatobiliary: No focal liver abnormality is seen. No gallstones, gallbladder wall thickening, or biliary dilatation. Pancreas: Unremarkable. No pancreatic ductal dilatation or surrounding inflammatory changes. Spleen: Normal in size without focal abnormality. Adrenals/Urinary Tract: Adrenal glands are unremarkable. Kidneys are normal, without renal calculi, focal lesion, or hydronephrosis. The urinary bladder is moderately distended and is otherwise unremarkable. Stomach/Bowel: Stomach is within  normal limits. Appendix appears normal. No evidence of bowel wall thickening, distention, or inflammatory changes. Vascular/Lymphatic: No significant vascular findings are present. No enlarged abdominal or pelvic lymph nodes. Reproductive: Uterus and bilateral adnexa are unremarkable. Other: No abdominal wall hernia or abnormality. No abdominopelvic ascites. Musculoskeletal: No acute or significant osseous findings. IMPRESSION: No acute or active process within the abdomen or pelvis. Electronically Signed   By: Suzen Dials M.D.   On: 02/02/2024 15:02   CT HEAD WO CONTRAST ( ) Result Date: 02/02/2024 CLINICAL DATA:  Mental status changes.  Head trauma.  Ataxia. EXAM: CT HEAD WITHOUT CONTRAST CT CERVICAL SPINE WITHOUT CONTRAST TECHNIQUE: Multidetector CT imaging of the head and cervical spine was performed following the standard protocol without intravenous contrast. Multiplanar CT image reconstructions of the cervical spine were also generated. RADIATION DOSE REDUCTION: This exam was performed according to the departmental dose-optimization program which includes automated exposure control, adjustment of the mA and/or kV according to patient size and/or use of iterative reconstruction technique. COMPARISON:  01/30/2024 FINDINGS: CT HEAD FINDINGS Brain: There is no evidence for acute hemorrhage, hydrocephalus, mass lesion, or abnormal extra-axial fluid collection. No definite CT evidence for acute infarction. Vascular: No hyperdense vessel or unexpected calcification. Skull: No evidence for fracture. No worrisome lytic or sclerotic lesion. Sinuses/Orbits: Chronic mucosal disease noted maxillary, right sphenoid, frontal and ethmoid sinuses. Visualized portions of the globes and intraorbital fat are unremarkable. Other: None. CT CERVICAL SPINE FINDINGS Alignment: Normal. Skull base and vertebrae: No acute fracture. No primary bone lesion or focal pathologic process. Soft tissues and spinal canal: No  prevertebral fluid or swelling. No visible canal hematoma. Disc levels:  Intervertebral disc spaces are preserved. Upper chest: No acute findings. Other: None. IMPRESSION: 1. No acute intracranial abnormality. 2. Chronic paranasal sinusitis. 3. No cervical spine fracture or subluxation. Electronically Signed   By: Camellia Candle M.D.   On: 02/02/2024 13:34   CT Cervical Spine Wo Contrast Result Date: 02/02/2024 CLINICAL DATA:  Mental status changes.  Head trauma.  Ataxia. EXAM: CT HEAD WITHOUT CONTRAST CT CERVICAL SPINE WITHOUT CONTRAST TECHNIQUE: Multidetector CT imaging of the head and cervical spine was performed following the standard protocol without intravenous contrast. Multiplanar CT  image reconstructions of the cervical spine were also generated. RADIATION DOSE REDUCTION: This exam was performed according to the departmental dose-optimization program which includes automated exposure control, adjustment of the mA and/or kV according to patient size and/or use of iterative reconstruction technique. COMPARISON:  01/30/2024 FINDINGS: CT HEAD FINDINGS Brain: There is no evidence for acute hemorrhage, hydrocephalus, mass lesion, or abnormal extra-axial fluid collection. No definite CT evidence for acute infarction. Vascular: No hyperdense vessel or unexpected calcification. Skull: No evidence for fracture. No worrisome lytic or sclerotic lesion. Sinuses/Orbits: Chronic mucosal disease noted maxillary, right sphenoid, frontal and ethmoid sinuses. Visualized portions of the globes and intraorbital fat are unremarkable. Other: None. CT CERVICAL SPINE FINDINGS Alignment: Normal. Skull base and vertebrae: No acute fracture. No primary bone lesion or focal pathologic process. Soft tissues and spinal canal: No prevertebral fluid or swelling. No visible canal hematoma. Disc levels:  Intervertebral disc spaces are preserved. Upper chest: No acute findings. Other: None. IMPRESSION: 1. No acute intracranial abnormality.  2. Chronic paranasal sinusitis. 3. No cervical spine fracture or subluxation. Electronically Signed   By: Camellia Candle M.D.   On: 02/02/2024 13:34   DG Chest Portable 1 View Result Date: 02/02/2024 CLINICAL DATA:  Altered mental status. EXAM: PORTABLE CHEST 1 VIEW COMPARISON:  12/17/2021 FINDINGS: Low volume film. The cardio pericardial silhouette is enlarged. The lungs are clear without focal pneumonia, edema, pneumothorax or pleural effusion. No acute bony abnormality. Telemetry leads overlie the chest. IMPRESSION: No active disease. Electronically Signed   By: Camellia Candle M.D.   On: 02/02/2024 12:31   CT Head Wo Contrast Result Date: 01/30/2024 CLINICAL DATA:  Provided history: Repeated head banging on solid surface. Polytrauma, blunt. EXAM: CT HEAD WITHOUT CONTRAST CT CERVICAL SPINE WITHOUT CONTRAST TECHNIQUE: Multidetector CT imaging of the head and cervical spine was performed following the standard protocol without intravenous contrast. Multiplanar CT image reconstructions of the cervical spine were also generated. RADIATION DOSE REDUCTION: This exam was performed according to the departmental dose-optimization program which includes automated exposure control, adjustment of the mA and/or kV according to patient size and/or use of iterative reconstruction technique. COMPARISON:  Head CT 03/12/2022. Cervical spine CT 03/12/2022. FINDINGS: CT HEAD FINDINGS Brain: Cerebral volume is normal. There is no acute intracranial hemorrhage. No demarcated cortical infarct. No extra-axial fluid collection. No evidence of an intracranial mass. No midline shift. Vascular: No hyperdense vessel. Skull: No calvarial fracture or aggressive osseous lesion. Sinuses/Orbits: No mass or acute finding within the imaged orbits. Moderate-to-severe mucosal thickening within the bilateral maxillary sinuses. Trace mucosal thickening within the sphenoid sinuses. Moderate bilateral ethmoid sinusitis. Moderate frontal sinusitis,  bilaterally. Other: Right posterior scalp hematoma and laceration. Poor dentition with periodontal disease. CT CERVICAL SPINE FINDINGS Alignment: No significant spondylolisthesis. Skull base and vertebrae: The basion-dental and atlanto-dental intervals are maintained.No evidence of acute fracture to the cervical spine. Soft tissues and spinal canal: No prevertebral fluid or swelling. No visible canal hematoma. Disc levels: Cervical spondylosis. No more than mild disc space narrowing. Multilevel disc bulges/central disc protrusions. Mild endplate spurring at C5-C6. No appreciable high-grade spinal canal stenosis. No significant bony neural foraminal narrowing. Upper chest: No consolidation within the imaged lung apices. No visible pneumothorax. Prior median sternotomy. IMPRESSION: CT head: 1.  No evidence of an acute intracranial abnormality. 2. Right posterior scalp hematoma and laceration. 3. Paranasal sinus disease as described. CT cervical spine: 1. No evidence of an acute cervical spine fracture. 2. Cervical spondylosis as described. Electronically Signed  By: Rockey Childs D.O.   On: 01/30/2024 10:21   CT Cervical Spine Wo Contrast Result Date: 01/30/2024 CLINICAL DATA:  Provided history: Repeated head banging on solid surface. Polytrauma, blunt. EXAM: CT HEAD WITHOUT CONTRAST CT CERVICAL SPINE WITHOUT CONTRAST TECHNIQUE: Multidetector CT imaging of the head and cervical spine was performed following the standard protocol without intravenous contrast. Multiplanar CT image reconstructions of the cervical spine were also generated. RADIATION DOSE REDUCTION: This exam was performed according to the departmental dose-optimization program which includes automated exposure control, adjustment of the mA and/or kV according to patient size and/or use of iterative reconstruction technique. COMPARISON:  Head CT 03/12/2022. Cervical spine CT 03/12/2022. FINDINGS: CT HEAD FINDINGS Brain: Cerebral volume is normal. There  is no acute intracranial hemorrhage. No demarcated cortical infarct. No extra-axial fluid collection. No evidence of an intracranial mass. No midline shift. Vascular: No hyperdense vessel. Skull: No calvarial fracture or aggressive osseous lesion. Sinuses/Orbits: No mass or acute finding within the imaged orbits. Moderate-to-severe mucosal thickening within the bilateral maxillary sinuses. Trace mucosal thickening within the sphenoid sinuses. Moderate bilateral ethmoid sinusitis. Moderate frontal sinusitis, bilaterally. Other: Right posterior scalp hematoma and laceration. Poor dentition with periodontal disease. CT CERVICAL SPINE FINDINGS Alignment: No significant spondylolisthesis. Skull base and vertebrae: The basion-dental and atlanto-dental intervals are maintained.No evidence of acute fracture to the cervical spine. Soft tissues and spinal canal: No prevertebral fluid or swelling. No visible canal hematoma. Disc levels: Cervical spondylosis. No more than mild disc space narrowing. Multilevel disc bulges/central disc protrusions. Mild endplate spurring at C5-C6. No appreciable high-grade spinal canal stenosis. No significant bony neural foraminal narrowing. Upper chest: No consolidation within the imaged lung apices. No visible pneumothorax. Prior median sternotomy. IMPRESSION: CT head: 1.  No evidence of an acute intracranial abnormality. 2. Right posterior scalp hematoma and laceration. 3. Paranasal sinus disease as described. CT cervical spine: 1. No evidence of an acute cervical spine fracture. 2. Cervical spondylosis as described. Electronically Signed   By: Rockey Childs D.O.   On: 01/30/2024 10:21      Labs: BNP (last 3 results) No results for input(s): BNP in the last 8760 hours. Basic Metabolic Panel: Recent Labs  Lab 02/02/24 1140 02/02/24 1645 02/03/24 0512 02/04/24 0824 02/05/24 0448 02/06/24 0420  NA 140  --  141 143 139 141  K 3.0*  --  3.0* 3.4* 3.3* 3.5  CL 100  --  105 104  104 104  CO2 26  --  26 27 28 28   GLUCOSE 159*  --  160* 183* 215* 221*  BUN 25*  --  19 14 15 16   CREATININE 2.91*  --  1.95* 1.37* 1.28* 1.17*  CALCIUM  9.1  --  8.9 9.7 9.4 9.4  MG  --  2.3  --   --   --   --    Liver Function Tests: Recent Labs  Lab 02/02/24 1140  AST 55*  ALT 22  ALKPHOS 76  BILITOT 0.9  PROT 6.6  ALBUMIN 3.0*   No results for input(s): LIPASE, AMYLASE in the last 168 hours. Recent Labs  Lab 02/02/24 1140  AMMONIA 18   CBC: Recent Labs  Lab 02/02/24 1140 02/03/24 0512  WBC 14.9* 11.3*  NEUTROABS 12.6*  --   HGB 10.7* 9.6*  HCT 34.0* 30.7*  MCV 93.7 94.8  PLT 278 165   Cardiac Enzymes: Recent Labs  Lab 02/02/24 1140 02/03/24 0512 02/04/24 0824 02/05/24 0448  CKTOTAL 1,830* 1,009* 384* 202  BNP: Invalid input(s): POCBNP CBG: Recent Labs  Lab 02/05/24 1227 02/05/24 1738 02/05/24 2238 02/06/24 0841 02/06/24 1209  GLUCAP 228* 184* 220* 218* 258*   D-Dimer No results for input(s): DDIMER in the last 72 hours. Hgb A1c No results for input(s): HGBA1C in the last 72 hours. Lipid Profile No results for input(s): CHOL, HDL, LDLCALC, TRIG, CHOLHDL, LDLDIRECT in the last 72 hours. Thyroid  function studies No results for input(s): TSH, T4TOTAL, T3FREE, THYROIDAB in the last 72 hours.  Invalid input(s): FREET3 Anemia work up No results for input(s): VITAMINB12, FOLATE, FERRITIN, TIBC, IRON, RETICCTPCT in the last 72 hours. Urinalysis    Component Value Date/Time   COLORURINE YELLOW (A) 02/02/2024 1703   APPEARANCEUR HAZY (A) 02/02/2024 1703   APPEARANCEUR Clear 07/10/2013 1629   LABSPEC 1.014 02/02/2024 1703   LABSPEC 1.013 07/10/2013 1629   PHURINE 5.0 02/02/2024 1703   GLUCOSEU NEGATIVE 02/02/2024 1703   GLUCOSEU Negative 07/10/2013 1629   HGBUR NEGATIVE 02/02/2024 1703   BILIRUBINUR NEGATIVE 02/02/2024 1703   BILIRUBINUR Negative 07/10/2013 1629   KETONESUR NEGATIVE 02/02/2024  1703   PROTEINUR 30 (A) 02/02/2024 1703   NITRITE NEGATIVE 02/02/2024 1703   LEUKOCYTESUR NEGATIVE 02/02/2024 1703   LEUKOCYTESUR Negative 07/10/2013 1629   Sepsis Labs Recent Labs  Lab 02/02/24 1140 02/03/24 0512  WBC 14.9* 11.3*   Microbiology Recent Results (from the past 240 hours)  Resp panel by RT-PCR (RSV, Flu A&B, Covid) Anterior Nasal Swab     Status: None   Collection Time: 02/02/24 12:23 PM   Specimen: Anterior Nasal Swab  Result Value Ref Range Status   SARS Coronavirus 2 by RT PCR NEGATIVE NEGATIVE Final    Comment: (NOTE) SARS-CoV-2 target nucleic acids are NOT DETECTED.  The SARS-CoV-2 RNA is generally detectable in upper respiratory specimens during the acute phase of infection. The lowest concentration of SARS-CoV-2 viral copies this assay can detect is 138 copies/mL. A negative result does not preclude SARS-Cov-2 infection and should not be used as the sole basis for treatment or other patient management decisions. A negative result may occur with  improper specimen collection/handling, submission of specimen other than nasopharyngeal swab, presence of viral mutation(s) within the areas targeted by this assay, and inadequate number of viral copies(<138 copies/mL). A negative result must be combined with clinical observations, patient history, and epidemiological information. The expected result is Negative.  Fact Sheet for Patients:  BloggerCourse.com  Fact Sheet for Healthcare Providers:  SeriousBroker.it  This test is no t yet approved or cleared by the [place]  FDA and  has been authorized for detection and/or diagnosis of SARS-CoV-2 by FDA under an Emergency Use Authorization (EUA). This EUA will remain  in effect (meaning this test can be used) for the duration of the COVID-19 declaration under Section 564(b)(1) of the Act, 21 U.S.C.section 360bbb-3(b)(1), unless the authorization is terminated   or revoked sooner.       Influenza A by PCR NEGATIVE NEGATIVE Final   Influenza B by PCR NEGATIVE NEGATIVE Final    Comment: (NOTE) The Xpert Xpress SARS-CoV-2/FLU/RSV plus assay is intended as an aid in the diagnosis of influenza from Nasopharyngeal swab specimens and should not be used as a sole basis for treatment. Nasal washings and aspirates are unacceptable for Xpert Xpress SARS-CoV-2/FLU/RSV testing.  Fact Sheet for Patients: BloggerCourse.com  Fact Sheet for Healthcare Providers: SeriousBroker.it  This test is not yet approved or cleared by the [place]  FDA and has been authorized for detection and/or diagnosis  of SARS-CoV-2 by FDA under an Emergency Use Authorization (EUA). This EUA will remain in effect (meaning this test can be used) for the duration of the COVID-19 declaration under Section 564(b)(1) of the Act, 21 U.S.C. section 360bbb-3(b)(1), unless the authorization is terminated or revoked.     Resp Syncytial Virus by PCR NEGATIVE NEGATIVE Final    Comment: (NOTE) Fact Sheet for Patients: BloggerCourse.com  Fact Sheet for Healthcare Providers: SeriousBroker.it  This test is not yet approved or cleared by the [place]  FDA and has been authorized for detection and/or diagnosis of SARS-CoV-2 by FDA under an Emergency Use Authorization (EUA). This EUA will remain in effect (meaning this test can be used) for the duration of the COVID-19 declaration under Section 564(b)(1) of the Act, 21 U.S.C. section 360bbb-3(b)(1), unless the authorization is terminated or revoked.  Performed at [facility], [address] [place], [name] [address]   MRSA Next Gen by PCR, Nasal     Status: None   Collection Time: 02/02/24  4:07 PM   Specimen: Nasal Mucosa; Nasal Swab  Result Value Ref Range Status   MRSA by PCR Next Gen NOT DETECTED NOT DETECTED  Final    Comment: (NOTE) The GeneXpert MRSA Assay (FDA approved for NASAL specimens only), is one component of a comprehensive MRSA colonization surveillance program. It is not intended to diagnose MRSA infection nor to guide or monitor treatment for MRSA infections. Test performance is not FDA approved in patients less than 6 years old. Performed at [facility], [address] [place], [name] [address]      Total time spend on discharging this patient, including the last patient exam, discussing the hospital stay, instructions for ongoing care as it relates to all pertinent caregivers, as well as preparing the medical discharge records, prescriptions, and/or referrals as applicable, is 40 minutes.    Ellouise Haber, MD  [facility] 02/06/2024, 12:53 PM

## 2024-02-06 NOTE — Plan of Care (Signed)
  Problem: Clinical Measurements: Goal: Ability to maintain clinical measurements within normal limits will improve Outcome: Progressing Goal: Will remain free from infection Outcome: Progressing   Problem: Health Behavior/Discharge Planning: Goal: Ability to manage health-related needs will improve Outcome: Progressing

## 2024-02-06 NOTE — TOC Transition Note (Signed)
 Transition of Care [facility]) - Discharge Note   Patient Details  Name: Dawn Lambert MRN: [id number] Date of Birth: [birth year]  [facility]) CM/SW Contact:  Dalia GORMAN Fuse, RN Phone Number: 02/06/2024, 11:08 AM   Clinical Narrative:    Patient is medically clear to discharge to [facility]. TOC spoke with Tammy at the facility to advise the patient is ok to dc back to the facility with 24/7 1:1 support. TOC confirmed the patient uses [facility] in [place]. Therapy recs HH PT/OT. Referral sent to [facility], Shaun accepted. Referral for RW and [facility] sent to [facility], Thomasina accepted. The facility will pick the patient up around 1:00 PM today.  Nurse to call report to Tammy [phone]   Final next level of care: Group Home Barriers to Discharge: Barriers Resolved   Patient Goals and CMS Choice            Discharge Placement                       Discharge Plan and Services Additional resources added to the After Visit Summary for                                       Social Drivers of Health (SDOH) Interventions SDOH Screenings   Food Insecurity: No Food Insecurity (02/02/2024)  Housing: High Risk (02/02/2024)  Transportation Needs: No Transportation Needs (02/02/2024)  Utilities: Not At Risk (02/02/2024)  Depression (PHQ2-9): Low Risk  (07/23/2023)  Tobacco Use: Low Risk  (02/02/2024)     Readmission Risk Interventions     No data to display

## 2024-02-06 NOTE — Progress Notes (Signed)
 Pt completed several sit<>stand transfers from various levels with Supervision and definite use of Upper body to power  up. Gait training with Regular RW, Bariatric RW, and Rollator to assess best supportive device for pt and home environment. Pt received BSC and Regular height RW and adjusted to proper height. Pt has made excellent functional progress and appears safe to return to her Group home. Continue PT per POC.    02/06/24 1205  PT Visit Information  Assistance Needed +1  History of Present Illness Nellene Wimmer is a 33 year old female with history of schizophrenia, intellectual disability who has been in a group home for 15 years, hypothyroid, morbid obesity, who presents ED from group home for chief concerns of altered mental status, admitted for AKI.  Precautions  Precautions Fall  Recall of Precautions/Restrictions Impaired  Restrictions  Weight Bearing Restrictions Per Provider Order No  Pain Assessment  Pain Assessment No/denies pain  Cognition  Arousal Alert  Behavior During Therapy [facility] for tasks assessed/performed  Following Commands  Following commands Intact  Cueing  Cueing Techniques Verbal cues;Tactile cues  Communication  Communication No apparent difficulties  Bed Mobility  General bed mobility comments  (NT)  Transfers  Overall transfer level Needs assistance  Equipment used Rolling walker (2 wheels)  Transfers Sit to/from Stand  Sit to Stand Supervision  General transfer comment  (Pt able to stand from low toilet with side rail and recliner chair several times with Supervision)  Ambulation/Gait  Ambulation/Gait assistance Contact guard assist  Gait Distance (Feet)  (45)  Assistive device Rolling walker (2 wheels)  Gait Pattern/deviations Step-to pattern  General Gait Details  (No dizziness in standing. Assessed for use with Bariatric and regular size RW, will d/c home with regurlar RW in order to navigate through home easier)  Gait velocity  (decr)   Balance  Overall balance assessment Needs assistance  Sitting-balance support Feet supported  Sitting balance-Leahy Scale Good  Standing balance support Single extremity supported;During functional activity  Standing balance-Leahy Scale Fair  General Comments  General comments (skin integrity, edema, etc.) Pt educated at length regarding DME recs, discussed home set up and maintaining safe environment upon d/c.  Exercises  Exercises Other exercises  Other Exercises  Other Exercises Adjusted new RW to proper height, issued gait belt for home use.  PT - End of Session  Equipment Utilized During Treatment Gait belt  Activity Tolerance Other (comment)  Patient left in chair;with chair alarm set  Nurse Communication Mobility status   PT - Assessment/Plan  PT Visit Diagnosis Unsteadiness on feet (R26.81);Difficulty in walking, not elsewhere classified (R26.2);Muscle weakness (generalized) (M62.81);Dizziness and giddiness (R42)  PT Frequency (ACUTE ONLY) Min 3X/week  Follow Up Recommendations Skilled nursing-short term rehab (<3 hours/day) (Home with HHPT if progressing)  Can patient physically be transported by private vehicle Yes  Patient can return home with the following A little help with walking and/or transfers;A little help with bathing/dressing/bathroom;Assistance with cooking/housework;Direct supervision/assist for medications management;Direct supervision/assist for financial management;Assist for transportation;Help with stairs or ramp for entrance;Supervision due to cognitive status  PT equipment Rolling walker (2 wheels)  AM-PAC PT 6 Clicks Mobility Outcome Measure (Version 2)  Help needed turning from your back to your side while in a flat bed without using bedrails? 3  Help needed moving from lying on your back to sitting on the side of a flat bed without using bedrails? 3  Help needed moving to and from a bed to a chair (including a wheelchair)? 3  Help  needed standing up  from a chair using your arms (e.g., wheelchair or bedside chair)? 3  Help needed to walk in hospital room? 3  Help needed climbing 3-5 steps with a railing?  3  6 Click Score 18  Consider Recommendation of Discharge To: Home with [facility]  Progressive Mobility  What is the highest level of mobility based on the mobility assessment? Level 4 (Ambulates with assistance) - Balance while stepping forward/back - Complete  Activity Ambulated with assistance  PT Goal Progression  Progress towards PT goals Progressing toward goals  PT Time Calculation  PT Start Time (ACUTE ONLY) 1204  PT Stop Time (ACUTE ONLY) 1227  PT Time Calculation (min) (ACUTE ONLY) 23 min  PT General Charges  $$ ACUTE PT VISIT 1 Visit  PT Treatments  $Gait Training 8-22 mins  $Therapeutic Activity 8-22 mins   Darice Bohr, PTA 02/06/2024

## 2024-02-06 NOTE — Progress Notes (Signed)
 Occupational Therapy Treatment Patient Details Name: Dawn Lambert MRN: [id number] DOB: 06-08-1990 Today's Date: 02/06/2024   History of present illness Dawn Lambert is a 33 year old female with history of schizophrenia, intellectual disability who has been in a group home for 15 years, hypothyroid, morbid obesity, who presents ED from group home for chief concerns of altered mental status, admitted for AKI.   OT comments  [name] was seen for OT treatment on this date. Upon arrival to room pt in bed, agreeable to tx. Pt requires CGA + RW for toilet t/f, clothign mgmt, and standing grooming tasks - MIN cues t/o to sequence ADLs. 1 minor LOB standing at toilet, pt abruptly sat to correct. Pt making good progress toward goals, will continue to follow POC. Discharge recommendation updated to [facility] to reflect pt progress.       If plan is discharge home, recommend the following:  A little help with walking and/or transfers;A little help with bathing/dressing/bathroom;Supervision due to cognitive status   Equipment Recommendations  BSC/3in1;Other (comment) (RW)    Recommendations for Other Services      Precautions / Restrictions Precautions Precautions: Fall Recall of Precautions/Restrictions: Impaired Restrictions Weight Bearing Restrictions Per Provider Order: No       Mobility Bed Mobility Overal bed mobility: Needs Assistance Bed Mobility: Sit to Supine Rolling: Supervision, Used rails              Transfers Overall transfer level: Needs assistance Equipment used: Rolling walker (2 wheels) Transfers: Sit to/from Stand Sit to Stand: Contact guard assist                 Balance Overall balance assessment: Needs assistance Sitting-balance support: Feet supported Sitting balance-Leahy Scale: Good     Standing balance support: Single extremity supported, During functional activity Standing balance-Leahy Scale: Fair                              ADL either performed or assessed with clinical judgement   ADL Overall ADL's : Needs assistance/impaired                                       General ADL Comments: CGA + RW for toilet t/f, clothign mgmt, and standing grooming tasks - MIN cues t/o to sequence ADLs.     Communication Communication Communication: No apparent difficulties   Cognition Arousal: Alert Behavior During Therapy: WFL for tasks assessed/performed Cognition: History of cognitive impairments             OT - Cognition Comments: pleasant, delayed processing baseline but responds quickly to verbal cues                 Following commands: Intact        Cueing   Cueing Techniques: Verbal cues, Tactile cues  Exercises      Shoulder Instructions       General Comments      Pertinent Vitals/ Pain       Pain Assessment Pain Assessment: No/denies pain   Frequency  Min 2X/week        Progress Toward Goals  OT Goals(current goals can now be found in the care plan section)  Progress towards OT goals: Progressing toward goals  Acute Rehab OT Goals OT Goal Formulation: With patient Time For Goal Achievement: 02/18/24 Potential to Achieve Goals: Good  ADL Goals Pt Will Perform Grooming: with supervision;standing Pt Will Perform Lower Body Dressing: sit to/from stand;with min assist Pt Will Transfer to Toilet: with contact guard assist;ambulating;regular height toilet  Plan      Co-evaluation                 AM-PAC OT 6 Clicks Daily Activity     Outcome Measure   Help from another person eating meals?: None Help from another person taking care of personal grooming?: A Little Help from another person toileting, which includes using toliet, bedpan, or urinal?: A Little Help from another person bathing (including washing, rinsing, drying)?: A Lot Help from another person to put on and taking off regular upper body clothing?: None Help from another person  to put on and taking off regular lower body clothing?: A Lot 6 Click Score: 18    End of Session Equipment Utilized During Treatment: Rolling walker (2 wheels)  OT Visit Diagnosis: Other abnormalities of gait and mobility (R26.89);Muscle weakness (generalized) (M62.81)   Activity Tolerance Patient tolerated treatment well   Patient Left in chair;with call bell/phone within reach;with chair alarm set   Nurse Communication          Time: 0950-1006 OT Time Calculation (min): 16 min  Charges: OT General Charges $OT Visit: 1 Visit OT Treatments $Self Care/Home Management : 8-22 mins  Elston Slot, M.S. OTR/L  02/06/24, 10:28 AM  ascom [phone]

## 2024-02-06 NOTE — Progress Notes (Signed)
 Narrative for 3 in 1 [facility]  The patient requires the use of a 3 in 1 BSC due to significant mobility and safety limitations. The patient is unable to ambulate safely to the bathroom because of advanced age, weakness, and fall risk. Transferring on and off a standard toilet presents a hazard, as the patient demonstrated decreased balance, endurance and strength.

## 2024-02-11 ENCOUNTER — Emergency Department
Admission: EM | Admit: 2024-02-11 | Discharge: 2024-02-12 | Disposition: A | Discharge status: Home / Self Care | Attending: Emergency Medicine | Admitting: Emergency Medicine

## 2024-02-11 ENCOUNTER — Emergency Department

## 2024-02-11 ENCOUNTER — Other Ambulatory Visit: Payer: Self-pay

## 2024-02-11 DIAGNOSIS — F79 Unspecified intellectual disabilities: Secondary | ICD-10-CM

## 2024-02-11 DIAGNOSIS — F259 Schizoaffective disorder, unspecified: Secondary | ICD-10-CM

## 2024-02-11 DIAGNOSIS — Z79899 Other long term (current) drug therapy: Secondary | ICD-10-CM | POA: Diagnosis not present

## 2024-02-11 DIAGNOSIS — R0789 Other chest pain: Secondary | ICD-10-CM | POA: Insufficient documentation

## 2024-02-11 DIAGNOSIS — I10 Essential (primary) hypertension: Secondary | ICD-10-CM | POA: Diagnosis not present

## 2024-02-11 DIAGNOSIS — E119 Type 2 diabetes mellitus without complications: Secondary | ICD-10-CM | POA: Insufficient documentation

## 2024-02-11 DIAGNOSIS — F3481 Disruptive mood dysregulation disorder: Secondary | ICD-10-CM

## 2024-02-11 DIAGNOSIS — R4689 Other symptoms and signs involving appearance and behavior: Secondary | ICD-10-CM

## 2024-02-11 LAB — URINALYSIS, ROUTINE W REFLEX MICROSCOPIC
Bacteria, UA: NONE SEEN
Bilirubin Urine: NEGATIVE
Glucose, UA: NEGATIVE mg/dL
Ketones, ur: NEGATIVE mg/dL
Nitrite: NEGATIVE
Protein, ur: NEGATIVE mg/dL
Specific Gravity, Urine: 1.009 (ref 1.005–1.030)
pH: 6 (ref 5.0–8.0)

## 2024-02-11 LAB — URINE DRUG SCREEN, QUALITATIVE (ARMC ONLY)
Amphetamines, Ur Screen: NOT DETECTED
Barbiturates, Ur Screen: NOT DETECTED
Benzodiazepine, Ur Scrn: NOT DETECTED
Cannabinoid 50 Ng, Ur ~~LOC~~: NOT DETECTED
Cocaine Metabolite,Ur ~~LOC~~: NOT DETECTED
MDMA (Ecstasy)Ur Screen: NOT DETECTED
Methadone Scn, Ur: NOT DETECTED
Opiate, Ur Screen: NOT DETECTED
Phencyclidine (PCP) Ur S: NOT DETECTED
Tricyclic, Ur Screen: POSITIVE — AB

## 2024-02-11 LAB — CBC
HCT: 32.2 % — ABNORMAL LOW (ref 36.0–46.0)
Hemoglobin: 10.3 g/dL — ABNORMAL LOW (ref 12.0–15.0)
MCH: 29.6 pg (ref 26.0–34.0)
MCHC: 32 g/dL (ref 30.0–36.0)
MCV: 92.5 fL (ref 80.0–100.0)
Platelets: 327 K/uL (ref 150–400)
RBC: 3.48 MIL/uL — ABNORMAL LOW (ref 3.87–5.11)
RDW: 13.3 % (ref 11.5–15.5)
WBC: 10.2 K/uL (ref 4.0–10.5)
nRBC: 0 % (ref 0.0–0.2)

## 2024-02-11 LAB — BASIC METABOLIC PANEL WITH GFR
Anion gap: 11 (ref 5–15)
BUN: 6 mg/dL (ref 6–20)
CO2: 26 mmol/L (ref 22–32)
Calcium: 9.4 mg/dL (ref 8.9–10.3)
Chloride: 100 mmol/L (ref 98–111)
Creatinine, Ser: 1.13 mg/dL — ABNORMAL HIGH (ref 0.44–1.00)
GFR, Estimated: >60 mL/min (ref >60)
Glucose, Bld: 160 mg/dL — ABNORMAL HIGH (ref 70–99)
Potassium: 3.4 mmol/L — ABNORMAL LOW (ref 3.5–5.1)
Sodium: 137 mmol/L (ref 135–145)

## 2024-02-11 LAB — TROPONIN I (HIGH SENSITIVITY): Troponin I (High Sensitivity): 6 ng/L (ref <18)

## 2024-02-11 LAB — ETHANOL: Alcohol, Ethyl (B): <15 mg/dL (ref <15)

## 2024-02-11 LAB — PREGNANCY, URINE: Preg Test, Ur: NEGATIVE

## 2024-02-11 LAB — CK: Total CK: 77 U/L (ref 38–234)

## 2024-02-11 MED ORDER — IBUPROFEN 600 MG PO TABS
600.0000 mg | ORAL_TABLET | Freq: Once | ORAL | Status: AC
Start: 2024-02-11 — End: 2024-02-11
  Administered 2024-02-11: 600 mg via ORAL
  Filled 2024-02-11: qty 1

## 2024-02-11 NOTE — ED Notes (Signed)
 Pt states she has generalized body aches and her nose hurts from throwing herself down on the floor earlier today before her arrival to this facility. EDP notified.

## 2024-02-11 NOTE — Consult Note (Signed)
 [facility] Consult Note  Patient Name: Dawn Lambert MRN: [id number] DOB: 21-Jul-1990 DATE OF Consult: 02/11/2024  PRIMARY PSYCHIATRIC DIAGNOSES  1.  Schizoaffective disorder 2.  DMDD 3.  Intellectual disability  RECOMMENDATIONS  Recommendations: Medication recommendations: Patient should continue with her home medications, I do not have access to a med list from the group home. I do not know what medication she is on. Non-Medication/therapeutic recommendations: Patient needs to follow up with her outpatient providers- medication management and therapy Is inpatient psychiatric hospitalization recommended for this patient? No (Explain why): patient is only stating she is suicidal due to not liking her group home, see HPI From a psychiatric perspective, is this patient appropriate for discharge to an outpatient setting/resource or other less restrictive environment for continued care?  Yes (Explain why): patient does not meet criteria for inpatient level of care Communication: Treatment team members (and family members if applicable) who were involved in treatment/care discussions and planning, and with whom we spoke or engaged with via secure text/chat, include the following: Treatment team via Epic Chat  Thank you for involving us  in the care of this patient. If you have any additional questions or concerns, please call [phone] and ask for me or the provider on-call.  TELEPSYCHIATRY ATTESTATION & CONSENT  As the provider for this telehealth consult, I attest that I verified the patient's identity using two separate identifiers, introduced myself to the patient, provided my credentials, disclosed my location, and performed this encounter via a HIPAA-compliant, real-time, face-to-face, two-way, interactive audio and video platform and with the full consent and agreement of the patient (or guardian as applicable.)  Patient physical location: [facility] ED. Telehealth provider physical  location: home office in state of [place] .  Video start time: 2145 [facility] Time) Video end time: 2205 (Central Time) Attempted to call guardian, Mother, [phone] . Your call has been forwarded to Voicemail  No identifying message regarding who's voicemail it was. Not HIPAA compliant. Did not leave a message.   IDENTIFYING DATA  Dawn Lambert is a 33 y.o. year-old female for whom a psychiatric consultation has been ordered by the primary provider. The patient was identified using two separate identifiers.  CHIEF COMPLAINT/REASON FOR CONSULT  Throwing herself on the floor or falling on the floor  HISTORY OF PRESENT ILLNESS (HPI)  The patient with known diagnoses of intellectual disability, schizoaffective disorder, behavioral problems presented to the ED, per the ED provider note, The patient was sent in because she currently threw herself on the floor and was threatening to harm herself.  However, the patient denies this.  She states that she fell accidentally and was not trying to throw herself on the floor.  She denies SI or HI currently.  She reports primary medical complaints.    On exam the patient immediately started in with a story about her mother wanting her to go to inpatient psychiatry to have her medications looked out because she' son 6 different medications. I advised her that he outpatient provider is the one who needs to address the amount of medication that she is on. That is not a reason to be hospitalized. Then she changed to the story to being suicidal. She was somehow going to get the glass from the window and cut her artery. She showed me where her artery was, the flexor ulnaris- thick part of the muscle of the upper forearm. She was going to slice in a horizontal manner against the grain of the muscle.  While I was  trying to figure out if she had broke the window or how she was going to get the glass out she changed to story to using her coffee cup.  When I asked her  what was going on and why she changed the story she clarified that she was going to  use a piece of the coffee cup and a piece of glass to cut her artery. She doesn't appear to be aware that she will need to break the glass and cup first.  I changed the subject and asked her why she told the ED doctor that she was not suicidal. She stated because she did not want a psychiatric assessment. But now she decided that she wants help so now she is telling the truth.  I explained that this is considered playing games when you lie to the doctors at the ED. This is not a good thing.  She stated that she has been suicidal for 2 days. Was finally able to determine that is has only been two days since that woman kept saying Tinnie Tinnie, and she did not like it.  That woman works at the group home. I asked if she was new since she's only been mad at her for two days and she stated no she's been there but she's new to the patient.  It was determined that the patient has only been at this group home since 12/19/23. She hates it there and would rather go to the hospital. I explained that was not a reason to go to the hospital. She then wanted the ED to send her back to her old group home because that one was better. I explained that we can't do that and if she has problems with her group home she needs to talk to her case manager.  I asked why she was asked to leave her old group home and stated because her behaviors were getting really bad. I pointed out to her that they were not likely to take her back if she had bad behaviors. She begged for 1-2 days away from the group home. I explained that we can't admit someone to the psych hospital for needing a break from the group home.   PAST PSYCHIATRIC HISTORY  Yes has been hospitalized Unknown if she's had an actual suicide attempt or gestures, throwing herself on the floor Has been on many different medications Otherwise as per HPI above.  PAST MEDICAL HISTORY   Past Medical History:  Diagnosis Date   Anxiety    Arnold-Chiari malformation (HCC)    Atrial septal defect    Complication of anesthesia    when pt was 11, woke up during surgery (muscle biopsy)   Constipation    Contraception management    Dandruff    Dermatomyositis (HCC)    age 57   Diabetes mellitus without complication (HCC)    Elevated blood pressure    Hypertension    Hypothyroidism    Mental retardation    Mood disorder    Overweight(278.02)    Pelvic pain    Prediabetes      HOME MEDICATIONS  Facility Ordered Medications  Medication   [COMPLETED] ibuprofen  (ADVIL ) tablet 600 mg   PTA Medications  Medication Sig   [Paused] lithium  carbonate 300 MG capsule Take 300-600 mg by mouth 2 (two) times daily. Take 300 mg by mouth every morning 600 mg At bedtime   Multiple Vitamin (THEREMS PO) Take 1 tablet by mouth daily. Take with food  acetaminophen  (TYLENOL ) 325 MG tablet Take 650 mg by mouth every 6 (six) hours as needed for mild pain or moderate pain.   Fluocinolone  Acetonide Scalp 0.01 % OIL Apply to aa's scalp QD on Monday, Wednesday and Friday x 6 weeks. After 6 wks decrease to QW on Friday. May increase to QD on Monday, Wednesday, and Friday during times of flares.   fluticasone  (FLONASE ) 50 MCG/ACT nasal spray Place 1 spray into both nostrils daily as needed for allergies or rhinitis.   Guaifenesin  1200 MG TB12 Take 1,200 mg by mouth daily as needed (Congestion).   nystatin  powder Apply 1 Application topically 2 (two) times daily as needed (fungal infection). Apply under breast   ondansetron  (ZOFRAN -ODT) 8 MG disintegrating tablet Take 8 mg by mouth 2 (two) times daily.   lidocaine  (XYLOCAINE ) 2 % solution Use as directed 15 mLs in the mouth or throat as needed for mouth pain.   Dulaglutide  0.75 MG/0.5ML SOAJ Inject 0.75 mg into the skin once a week. Wednesday   carbamazepine  (TEGRETOL -XR) 200 MG 12 hr tablet Take 1 tablet (200 mg total) by mouth 2 (two) times  daily.   albuterol  (VENTOLIN  HFA) 108 (90 Base) MCG/ACT inhaler Inhale 2 puffs into the lungs every 6 (six) hours as needed for wheezing or shortness of breath.   clonazePAM  (KLONOPIN ) 1 MG tablet Take 1 tablet (1 mg total) by mouth 2 (two) times daily.   gabapentin  (NEURONTIN ) 800 MG tablet Take 1 tablet (800 mg total) by mouth 3 (three) times daily.   haloperidol  (HALDOL ) 5 MG tablet Take 1 tablet (5 mg total) by mouth at bedtime.   levothyroxine  (SYNTHROID ) 175 MCG tablet Take 1 tablet (175 mcg total) by mouth daily before breakfast.   loratadine  (CLARITIN ) 10 MG tablet Take 1 tablet (10 mg total) by mouth daily.   nystatin  (MYCOSTATIN /NYSTOP ) powder Apply 1 Application topically 2 (two) times daily.   paliperidone  (INVEGA ) 6 MG 24 hr tablet Take 6 mg by mouth at bedtime.   QUEtiapine  (SEROQUEL ) 200 MG tablet Take 200 mg by mouth at bedtime.   propranolol  (INDERAL ) 10 MG tablet Take 1 tablet (10 mg total) by mouth 2 (two) times daily. Reduced from 40 mg.   Blood Glucose Monitoring Suppl DEVI 1 each by Does not apply route 3 (three) times daily. May dispense any manufacturer covered by patient's insurance.   Glucose Blood (BLOOD GLUCOSE TEST STRIPS) STRP 1 each by Does not apply route 3 (three) times daily. Use as directed to check blood sugar. May dispense any manufacturer covered by patient's insurance and fits patient's device.   Lancet Device MISC 1 each by Does not apply route 3 (three) times daily. May dispense any manufacturer covered by patient's insurance.   Lancets MISC 1 each by Does not apply route 3 (three) times daily. Use as directed to check blood sugar. May dispense any manufacturer covered by patient's insurance and fits patient's device.     ALLERGIES  Allergies  Allergen Reactions   Amoxicillin-Pot Clavulanate Other (See Comments)    Has patient had a PCN reaction causing immediate rash, facial/tongue/throat swelling, SOB or lightheadedness with hypotension: ______  Has  patient had a PCN reaction causing severe rash involving mucus membranes or skin necrosis: _______  Has patient had a PCN reaction that required hospitalization: _______  Has patient had a PCN reaction occurring within the last 10 years:_______  If all of the above answers are NO, then may proceed with Cephalosporin use.  Other Reaction(s): Unknown  Other reaction(s): Other (See Comments)  Has patient had a PCN reaction causing immediate rash, facial/tongue/throat swelling, SOB or lightheadedness with hypotension: ______  Has patient had a PCN reaction causing severe rash involving mucus membranes or skin necrosis: _______  Has patient had a PCN reaction that required hospitalization: _______  Has patient had a PCN reaction occurring within the last 10 years:_______  If all of the above answers are NO, then may proceed with Cephalosporin use.  Other Reaction(s): Chest Pain    SOCIAL & SUBSTANCE USE HISTORY  Social History   Socioeconomic History   Marital status: Single    Spouse name: Not on file   Number of children: Not on file   Years of education: Not on file   Highest education level: Not on file  Occupational History   Not on file  Tobacco Use   Smoking status: Never   Smokeless tobacco: Never  Vaping Use   Vaping status: Never Used  Substance and Sexual Activity   Alcohol use: No   Drug use: No   Sexual activity: Not on file  Other Topics Concern   Not on file  Social History Narrative   Not on file   Social Drivers of Health   Financial Resource Strain: Not on file  Food Insecurity: No Food Insecurity (02/02/2024)   Hunger Vital Sign    Worried About Running Out of Food in the Last Year: Never true    Ran Out of Food in the Last Year: Never true  Transportation Needs: No Transportation Needs (02/02/2024)   PRAPARE - Administrator, Civil Service (Medical): No    Lack of Transportation (Non-Medical): No  Physical Activity: Not on file   Stress: Not on file  Social Connections: Not on file   Social History   Tobacco Use  Smoking Status Never  Smokeless Tobacco Never   Social History   Substance and Sexual Activity  Alcohol Use No   Social History   Substance and Sexual Activity  Drug Use No    Additional pertinent information .  FAMILY HISTORY  History reviewed. No pertinent family history. Family Psychiatric History (if known):  unknown  MENTAL STATUS EXAM (MSE)  Mental Status Exam: General Appearance: Disheveled and abrasions to her nose and forehead  Orientation:  Other:  oriented to self and place  Memory:  patient displays poor memory with lots of fabrications  Concentration:  Concentration: Poor  Recall:  NA  Attention  Poor  Eye Contact:  Poor  Speech:  Slow  Language:  Fair  Volume:  Normal  Mood: angry because she does not like her new group home  Affect:  Flat  Thought Process:  Disorganized  Thought Content:  Illogical  Suicidal Thoughts:  make suicidal statements in the context of not wanting to return to the group home  Homicidal Thoughts:  No  Judgement:  Poor  Insight:  Lacking  Psychomotor Activity:  Flacid and appears to very poor muscle tone as she was talking she would slowly lay herself forward across her own lap as she was looking at the floor talking  Akathisia:  No  Fund of Knowledge:  Poor    Assets:  Housing  Cognition:  Impaired,  Moderate  ADL's:  Impaired  AIMS (if indicated):       VITALS  Blood pressure 134/88, pulse 72, temperature 98.7 F (37.1 C), temperature source Oral, resp. rate 15, SpO2 97%.  LABS  Admission on 02/11/2024  Component  Date Value Ref Range Status   Sodium 02/11/2024 137  135 - 145 mmol/L Final   Potassium 02/11/2024 3.4 (L)  3.5 - 5.1 mmol/L Final   Chloride 02/11/2024 100  98 - 111 mmol/L Final   CO2 02/11/2024 26  22 - 32 mmol/L Final   Glucose, Bld 02/11/2024 160 (H)  70 - 99 mg/dL Final   Glucose reference range applies only to  samples taken after fasting for at least 8 hours.   BUN 02/11/2024 6  6 - 20 mg/dL Final   Creatinine, Ser 02/11/2024 1.13 (H)  0.44 - 1.00 mg/dL Final   Calcium  02/11/2024 9.4  8.9 - 10.3 mg/dL Final   GFR, Estimated 02/11/2024 >60  >60 mL/min Final   Comment: (NOTE) Calculated using the CKD-EPI Creatinine Equation (2021)    Anion gap 02/11/2024 11  5 - 15 Final   Performed at [facility], [address] [place], [name] [address]   WBC 02/11/2024 10.2  4.0 - 10.5 K/uL Final   RBC 02/11/2024 3.48 (L)  3.87 - 5.11 MIL/uL Final   Hemoglobin 02/11/2024 10.3 (L)  12.0 - 15.0 g/dL Final   HCT 89/86/7974 32.2 (L)  36.0 - 46.0 % Final   MCV 02/11/2024 92.5  80.0 - 100.0 fL Final   MCH 02/11/2024 29.6  26.0 - 34.0 pg Final   MCHC 02/11/2024 32.0  30.0 - 36.0 g/dL Final   RDW 89/86/7974 13.3  11.5 - 15.5 % Final   Platelets 02/11/2024 327  150 - 400 K/uL Final   nRBC 02/11/2024 0.0  0.0 - 0.2 % Final   Performed at [facility], [address] [place], [name] [address]   Troponin I (High Sensitivity) 02/11/2024 6  <18 ng/L Final   Comment: (NOTE) Elevated high sensitivity troponin I (hsTnI) values and significant  changes across serial measurements may suggest ACS but many other  chronic and acute conditions are known to elevate hsTnI results.  Refer to the Links section for chest pain algorithms and additional  guidance. Performed at [facility], [address] [place], [name] [address]    Alcohol, Ethyl (B) 02/11/2024 <15  <15 mg/dL Final   Comment: (NOTE) For medical purposes only. Performed at [facility], [address] [place], [name] [address]    Tricyclic, Ur Screen 02/11/2024 POSITIVE (A)  NONE DETECTED Final   Amphetamines, Ur Screen 02/11/2024 NONE DETECTED  NONE DETECTED Final   MDMA (Ecstasy)Ur Screen 02/11/2024 NONE DETECTED  NONE DETECTED Final   Cocaine Metabolite,Ur [place] 02/11/2024 NONE DETECTED  NONE DETECTED Final    Opiate, Ur Screen 02/11/2024 NONE DETECTED  NONE DETECTED Final   Phencyclidine (PCP) Ur S 02/11/2024 NONE DETECTED  NONE DETECTED Final   Cannabinoid 50 Ng, Ur [place] 02/11/2024 NONE DETECTED  NONE DETECTED Final   Barbiturates, Ur Screen 02/11/2024 NONE DETECTED  NONE DETECTED Final   Benzodiazepine, Ur Scrn 02/11/2024 NONE DETECTED  NONE DETECTED Final   Methadone Scn, Ur 02/11/2024 NONE DETECTED  NONE DETECTED Final   Comment: (NOTE) Tricyclics + metabolites, urine    Cutoff 1000 ng/mL Amphetamines + metabolites, urine  Cutoff 1000 ng/mL MDMA (Ecstasy), urine              Cutoff 500 ng/mL Cocaine Metabolite, urine          Cutoff 300 ng/mL Opiate + metabolites, urine        Cutoff 300 ng/mL Phencyclidine (PCP), urine  Cutoff 25 ng/mL Cannabinoid, urine                 Cutoff 50 ng/mL Barbiturates + metabolites, urine  Cutoff 200 ng/mL Benzodiazepine, urine              Cutoff 200 ng/mL Methadone, urine                   Cutoff 300 ng/mL  The urine drug screen provides only a preliminary, unconfirmed analytical test result and should not be used for non-medical purposes. Clinical consideration and professional judgment should be applied to any positive drug screen result due to possible interfering substances. A more specific alternate chemical method must be used in order to obtain a confirmed analytical result. Gas chromatography / mass spectrometry (GC/MS) is the preferred confirm                          atory method. Performed at [facility], [address] [place], [name] [address]    Color, Urine 02/11/2024 YELLOW (A)  YELLOW Final   APPearance 02/11/2024 CLEAR (A)  CLEAR Final   Specific Gravity, Urine 02/11/2024 1.009  1.005 - 1.030 Final   pH 02/11/2024 6.0  5.0 - 8.0 Final   Glucose, UA 02/11/2024 NEGATIVE  NEGATIVE mg/dL Final   Hgb urine dipstick 02/11/2024 SMALL (A)  NEGATIVE Final   Bilirubin Urine 02/11/2024 NEGATIVE  NEGATIVE Final   Ketones,  ur 02/11/2024 NEGATIVE  NEGATIVE mg/dL Final   Protein, ur 89/86/7974 NEGATIVE  NEGATIVE mg/dL Final   Nitrite 89/86/7974 NEGATIVE  NEGATIVE Final   Leukocytes,Ua 02/11/2024 TRACE (A)  NEGATIVE Final   RBC / HPF 02/11/2024 0-5  0 - 5 RBC/hpf Final   WBC, UA 02/11/2024 6-10  0 - 5 WBC/hpf Final   Bacteria, UA 02/11/2024 NONE SEEN  NONE SEEN Final   Squamous Epithelial / HPF 02/11/2024 0-5  0 - 5 /HPF Final   Performed at [facility], [address] [place], [name] [address]   Total CK 02/11/2024 77  38 - 234 U/L Final   Performed at [facility], [address] [place], [name] [address]   Preg Test, Ur 02/11/2024 NEGATIVE  NEGATIVE Final   Comment:        THE SENSITIVITY OF THIS METHODOLOGY IS >20 mIU/mL. Performed at [facility], [address], [place], [name] [address]     PSYCHIATRIC REVIEW OF SYSTEMS (ROS)  ROS: Notable for the following relevant positive findings: ROS  Additional findings:      Musculoskeletal: Impaired      Gait & Station: Laying/Sitting      Pain Screening: Denies      Nutrition & Dental Concerns: no concerns  RISK FORMULATION/ASSESSMENT  Is the patient experiencing any suicidal or homicidal ideations: Yes       Explain if yes: patient is endorsing suicidal ideation with the intent to not go back to her group home.  She does not like a certain person there that she's been exposed to the last two days and that is what has made her feel suicidal for two days. Protective factors considered for safety management: patient lives in a controlled environment  Risk factors/concerns considered for safety management:  Impulsivity  Is there a safety management plan with the patient and treatment team to minimize risk factors and promote protective factors: Yes           Explain: patient  was monitored and ED Is crisis care placement or psychiatric hospitalization recommended: No     Based on my current evaluation and risk  assessment, patient is determined at this time to be at:  Low risk  *RISK ASSESSMENT Risk assessment is a dynamic process; it is possible that this patient's condition, and risk level, may change. This should be re-evaluated and managed over time as appropriate. Please re-consult psychiatric consult services if additional assistance is needed in terms of risk assessment and management. If your team decides to discharge this patient, please advise the patient how to best access emergency psychiatric services, or to call 911, if their condition worsens or they feel unsafe in any way.   Stefhanie Kachmar A Ariyan Sinnett, NP Telepsychiatry Consult Services

## 2024-02-11 NOTE — ED Notes (Signed)
 Dinner tray provided to pt

## 2024-02-11 NOTE — ED Notes (Signed)
 Pt on video visit with tele psych at this time.

## 2024-02-11 NOTE — ED Provider Notes (Signed)
 [facility] Provider Note    Event Date/Time   First MD Initiated Contact with Patient 02/11/24 1537     (approximate)   History   Chest Pain and Psychiatric Evaluation (/)   HPI  Dawn Lambert is a 33 y.o. female with a history of diabetes, hypertension, schizophrenia and intellectual disability who presents with multiple.  The patient was sent in because she currently threw herself on the floor and was threatening to harm herself.  However, the patient denies this.  She states that she fell accidentally and was not trying to throw herself on the floor.  She denies SI or HI currently.  She reports primary medical complaints.  She states that she is concerned that her urine has bacteria in it because she was told this at the outpatient clinic.  She also reports sharp chest pain and shortness of breath over the last 2 days.  She has a mild cough.  She denies any fever.  She has no vomiting or diarrhea.  I reviewed the past medical records.  The patient was just admitted to the hospitalist service earlier this month and discharged on 10/8 after being treated for elevated lithium , rhabdomyolysis, and AKI.   Physical Exam   Triage Vital Signs: ED Triage Vitals  Encounter Vitals Group     BP 02/11/24 1459 134/88     Girls Systolic BP Percentile --      Girls Diastolic BP Percentile --      Boys Systolic BP Percentile --      Boys Diastolic BP Percentile --      Pulse Rate 02/11/24 1459 72     Resp 02/11/24 1459 15     Temp 02/11/24 1459 98.7 F (37.1 C)     Temp Source 02/11/24 1459 Oral     SpO2 02/11/24 1459 97 %     Weight --      Height --      Head Circumference --      Peak Flow --      Pain Score 02/11/24 1457 8     Pain Loc --      Pain Education --      Exclude from Growth Chart --     Most recent vital signs: Vitals:   02/11/24 1459 02/11/24 2326  BP: 134/88 (!) 125/95  Pulse: 72 75  Resp: 15 17  Temp: 98.7 F (37.1 C) 97.8 F  (36.6 C)  SpO2: 97% 99%     General: Alert, comfortable appearing, no distress.  CV:  Good peripheral perfusion.  Heart sounds. Resp:  Normal effort.  Lungs CTAB. Abd:  No distention.  Other:  Flat affect.  Calm and cooperative.   ED Results / Procedures / Treatments   Labs (all labs ordered are listed, but only abnormal results are displayed) Labs Reviewed  BASIC METABOLIC PANEL WITH GFR - Abnormal; Notable for the following components:      Result Value   Potassium 3.4 (*)    Glucose, Bld 160 (*)    Creatinine, Ser 1.13 (*)    All other components within normal limits  CBC - Abnormal; Notable for the following components:   RBC 3.48 (*)    Hemoglobin 10.3 (*)    HCT 32.2 (*)    All other components within normal limits  URINE DRUG SCREEN, QUALITATIVE ([facility] ONLY) - Abnormal; Notable for the following components:   Tricyclic, Ur Screen POSITIVE (*)    All other components  within normal limits  URINALYSIS, ROUTINE W REFLEX MICROSCOPIC - Abnormal; Notable for the following components:   Color, Urine YELLOW (*)    APPearance CLEAR (*)    Hgb urine dipstick SMALL (*)    Leukocytes,Ua TRACE (*)    All other components within normal limits  ETHANOL  CK  PREGNANCY, URINE  TROPONIN I (HIGH SENSITIVITY)     EKG  ED ECG REPORT I, Waylon Cassis, the attending physician, personally viewed and interpreted this ECG.  Date: 02/11/2024 EKG Time: 1511 Rate: 77 Rhythm: normal sinus rhythm QRS Axis: normal Intervals: normal ST/T Wave abnormalities: nonspecific ST abnormalities Narrative Interpretation: no evidence of acute ischemia; no significant change when compared to EKG of 02/02/2024    RADIOLOGY  Chest x-ray: I independently viewed and interpreted the images; there is no focal consolidation or edema   PROCEDURES:  Critical Care performed: No  Procedures   MEDICATIONS ORDERED IN ED: Medications  ibuprofen  (ADVIL ) tablet 600 mg (600 mg Oral Given  02/11/24 1938)     IMPRESSION / MDM / ASSESSMENT AND PLAN / ED COURSE  I reviewed the triage vital signs and the nursing notes.  33 year old female with PMH as noted above presents with concern for SI and attempting to harm herself although she denies these symptoms and primarily reports atypical chest discomfort and concern for UTI.  She was recently admitted for AKI and rhabdomyolysis.  Physical exam is unremarkable.  Differential diagnosis includes, but is not limited to, major depressive disorder, schizoaffective disorder, other mood disorder.  Differential for the physical symptoms includes musculoskeletal chest pain, GERD, acute bronchitis, less likely cardiac etiology.  There is no clinical evidence for PE.  EKG is nonischemic.  We will obtain lab workup, chest x-ray, psychiatry and TTS consults, and reassess.  Patient's presentation is most consistent with acute complicated illness / injury requiring diagnostic workup.  The patient is on the cardiac monitor to evaluate for evidence of arrhythmia and/or significant heart rate changes.   ----------------------------------------- 11:36 PM on 02/11/2024 -----------------------------------------  The medical workup is negative for acute findings.  Urinalysis shows 6-10 WBCs but no bacteria or nitrites.  There is no evidence of UTI.  Chest x-ray is clear.  Troponin is negative.  CK is normal.  The patient's creatinine has returned to baseline.  The patient is medically cleared.  Psychiatry and TTS consults are pending.  I have signed the patient out to the oncoming ED physician Dr. Gordan.   FINAL CLINICAL IMPRESSION(S) / ED DIAGNOSES   Final diagnoses:  Atypical chest pain  Behavior concern     Rx / DC Orders   ED Discharge Orders     None        Note:  This document was prepared using Dragon voice recognition software and may include unintentional dictation errors.    Cassis Waylon, MD 02/11/24 2337

## 2024-02-11 NOTE — ED Notes (Signed)
Vol pending consult 

## 2024-02-11 NOTE — ED Notes (Addendum)
 Pt dressed out by this RN and Edt:  1 gray bra 1 pair purple underwear 1 cheetah dress 1 pair brown boots 1 pair gray socks

## 2024-02-11 NOTE — BH Assessment (Signed)
 Patient was deferred to [facility] for a telepsych assessment. The assigned care coordinator will provide updates regarding the scheduling of the assessment. [facility] coordinator can be reached at [phone] for further information on the timing of the telepsych evaluation.

## 2024-02-11 NOTE — ED Notes (Signed)
PT given water.

## 2024-02-11 NOTE — ED Notes (Signed)
 Lab at bedside

## 2024-02-11 NOTE — ED Triage Notes (Addendum)
 Pt to ED via [facility] from [facility] after threatening to harm herself and was throwing herself in the floor. Pt resides at [facility].   Pt reports has ongoing infection in her urine and was seen and admitted on 10/4 for same and feels symptoms have gotten worse. Pt also reports CP and SOB. Pt has abrasion to nose and reports fall a week ago and fell again today. Pt reports has been med compliant. Denies SI/HI. Pt reports she fell and did not throw herself in the floor.   This RN spoke with Charlies (mother) who is legal guardian.

## 2024-02-12 NOTE — ED Notes (Signed)
 Pt called this RN to her room to ask if she can be admitted to the hospital or if we can call her old group home and send her back there. This RN stated that we could do neither and that she had to go back to her current group home even though she was mad at a group home employee.   Pt told this RN that she was going to be ugly to the group home staff when they come and pick her up.

## 2024-02-12 NOTE — ED Provider Notes (Signed)
 Emergency Medicine Observation Re-evaluation Note   BP (!) 125/95 (BP Location: Right Arm)   Pulse 75   Temp 97.8 F (36.6 C) (Oral)   Resp 17   SpO2 99%    ED Course / MDM   No reported events during my shift at the time of this note.   Pt is awaiting dispo from consultants   Ginnie Shams MD    Shams Ginnie, MD 02/12/24 [id number]

## 2024-02-12 NOTE — ED Notes (Signed)
 Assisted the pt to the restroom with the use of a walker and then back to bed.

## 2024-02-12 NOTE — ED Notes (Signed)
 VOL/Pt will be returning back to Group Home, as pt does not meet inpt requirements. Patient will be picked up between 0800-0900 on 02/12/24

## 2024-02-12 NOTE — ED Notes (Signed)
 Pt stated she urinated on herself. Pt still sitting on the floor.

## 2024-02-12 NOTE — ED Notes (Signed)
 Pt changed and peri care provided by this RN, Charge Rn, and ED Tech. Pt placed in new underwear and scrubs. Pt attempted several times to hit, scratch, and bite staff during this process. Pt stated she will urinate and poop on herself again. Sitter remains at bedside.

## 2024-02-12 NOTE — ED Notes (Signed)
 Pt states she is nauseas. Pt given ginger ale to see if that brings relief.

## 2024-02-12 NOTE — ED Notes (Signed)
 Pt discharged at this time. RN reviewed discharge instructions with pt. Pt verbalized understanding. Vital signs taken. RR even and unlabored. RN wheeled pt out to group home representative. Pt states I do not want to go to group home. Pt walks to van and get in [place].

## 2024-02-12 NOTE — ED Notes (Signed)
 Breakfast tray given to pt

## 2024-02-12 NOTE — ED Notes (Signed)
 Pt ambulated to restroom and back to her room by EDT.

## 2024-02-12 NOTE — ED Notes (Signed)
 Pt yelling from her room, I hate my group home. I'm going to be ugly to them when they come to get me. This RN to pt's room and instructed pt that we will not be yelling and tolerating this behavior. That it was still several hours before her group home was coming to pick her up. I also instructed pt that those choices were not good choices and she should reconsider her plan of action.

## 2024-02-12 NOTE — ED Notes (Signed)
 This RN called the group home and spoke with Ms. [name] about pt being ready for discharge as pt does not meet inpatient requirements. Ms. [name] said pt can return but no one can pick her up until today between 0800-0900. EDP updated.

## 2024-02-12 NOTE — ED Notes (Signed)
 Pt had a bowel movement on herself on purpose while sitting on the floor. Charge RN to bedside because pt is refusing to let us  help her up. Pt was attempted to be helped up by [facility] and this RN. Pt refused to assist so pt could not be picked up. Pt still sitting on the floor. Quad tech sitting outside pt's door to monitor pt's behavior.

## 2024-02-12 NOTE — ED Notes (Signed)
 Pt stating now that she urinated on herself and had another BM. Pt still refusing to allow staff to help her up off the floor and get her cleaned up.

## 2024-02-12 NOTE — ED Notes (Signed)
 Pt still refusing to let staff help provide peri care and new clothes after having a bowel movement on herself. Pt still sitting in the floor refusing to get up. Sitter still present for safety.

## 2024-04-01 ENCOUNTER — Inpatient Hospital Stay

## 2024-04-01 ENCOUNTER — Inpatient Hospital Stay: Admitting: Oncology

## 2024-05-01 NOTE — Progress Notes (Deleted)
 NO SHOW

## 2024-05-02 ENCOUNTER — Ambulatory Visit: Attending: Cardiovascular Disease | Admitting: Cardiovascular Disease

## 2024-05-02 DIAGNOSIS — E1165 Type 2 diabetes mellitus with hyperglycemia: Secondary | ICD-10-CM

## 2024-05-02 DIAGNOSIS — F603 Borderline personality disorder: Secondary | ICD-10-CM

## 2024-05-12 NOTE — Progress Notes (Unsigned)
" °  Cardiology Office Note   Date:  05/13/2024  ID:  Dawn Lambert, DOB August 12, 1990, MRN [id number] [facility]CP: [facility]   HeartCare Providers Cardiologist:  Caron Poser, MD     History of Present Illness Dawn Lambert is a 34 y.o. 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  Previously followed by Dr. Darron in 2013.  Hospitalized October 2025 for hypovolemic shock.  No recent LDL on file.  Patient reports occasional dyspnea on exertion, but she attributes this to being overweight.  She denies any chest discomfort.  Denies any syncope.  Denies any orthopnea or lower extremity edema.  Relevant CVD History - TTE 10/2011 normal biventricular function, mild MR, mild thickening of the IAS without evidence of shunting by Doppler - Reported ASD repair ~1996 at age of 4   ROS: Pt denies any chest discomfort, jaw pain, arm pain, palpitations, syncope, presyncope, orthopnea, PND, or LE edema.  Studies Reviewed I have independently reviewed the patient's ECG, previous cardiac testing, previous medical records, previous blood work.  Physical Exam VS:  BP 122/82 (BP Location: Left Arm, Patient Position: Sitting, Cuff Size: Large)   Ht 5' 3 (1.6 m)   Wt 266 lb (120.7 kg)   SpO2 96%   BMI 47.12 kg/m        Wt Readings from Last 3 Encounters:  05/13/24 266 lb (120.7 kg)  02/02/24 266 lb 12.1 oz (121 kg)  11/10/23 282 lb 3 oz (128 kg)    GEN: No acute distress. NECK: No JVD; No carotid bruits. CARDIAC: RRR, no murmurs, rubs, gallops. RESPIRATORY:  Clear to auscultation. EXTREMITIES:  Warm and well-perfused. No edema.  ASSESSMENT AND PLAN History of ASD status post repair ~1996 Patient presents to establish care.  She has not had an echocardiogram since 2013.  She has an occasional dyspnea which she attributes to being overweight, though nothing that has worsened  recently.  No other cardiovascular symptoms to report.  Plan: - Will update an echocardiogram with bubble study; further plans pending results  Cardiac risk counseling Encouraged continued weight loss efforts to optimize longitudinal ASCVD risk        Dispo: RTC 1 year or sooner as needed  Signed, Caron Poser, MD  "

## 2024-05-13 ENCOUNTER — Ambulatory Visit

## 2024-05-13 VITALS — BP 122/82 | Ht 63.0 in | Wt 266.0 lb

## 2024-05-13 DIAGNOSIS — Q211 Atrial septal defect, unspecified: Secondary | ICD-10-CM | POA: Diagnosis not present

## 2024-05-13 DIAGNOSIS — Z7189 Other specified counseling: Secondary | ICD-10-CM

## 2024-05-13 NOTE — Patient Instructions (Signed)
 Medication Instructions:  Your physician recommends that you continue on your current medications as directed. Please refer to the Current Medication list given to you today.  *If you need a refill on your cardiac medications before your next appointment, please call your pharmacy*  Lab Work: No labs ordered today  If you have labs (blood work) drawn today and your tests are completely normal, you will receive your results only by: MyChart Message (if you have MyChart) OR A paper copy in the mail If you have any lab test that is abnormal or we need to change your treatment, we will call you to review the results.  Testing/Procedures: Your physician has requested that you have an echocardiogram. Echocardiography is a painless test that uses sound waves to create images of your heart. It provides your doctor with information about the size and shape of your heart and how well your hearts chambers and valves are working.   You may receive an ultrasound enhancing agent through an IV if needed to better visualize your heart during the echo. This procedure takes approximately one hour.  There are no restrictions for this procedure.  This will take place at [address] [facility], [place] [address]  Please note: We ask at that you not bring children with you during ultrasound (echo/ vascular) testing. Due to room size and safety concerns, children are not allowed in the ultrasound rooms during exams. Our front office staff cannot provide observation of children in our lobby area while testing is being conducted. An adult accompanying a patient to their appointment will only be allowed in the ultrasound room at the discretion of the ultrasound technician under special circumstances. We apologize for any inconvenience.   Follow-Up: At [facility], you and your health needs are our priority.  As part of our continuing mission to provide you with exceptional heart  care, our providers are all part of one team.  This team includes your primary Cardiologist (physician) and Advanced Practice Providers or APPs (Physician Assistants and Nurse Practitioners) who all work together to provide you with the care you need, when you need it.  Your next appointment:   12 month(s)  Provider:   You will see one of the following Advanced Practice Providers on your designated Care Team:   Lonni Meager, NP Lesley Maffucci, PA-C Bernardino Bring, PA-C Cadence Merriam, PA-C Tylene Lunch, NP Barnie Hila, NP  We recommend signing up for the patient portal called MyChart.  Sign up information is provided on this After Visit Summary.  MyChart is used to connect with patients for Virtual Visits (Telemedicine).  Patients are able to view lab/test results, encounter notes, upcoming appointments, etc.  Non-urgent messages can be sent to your provider as well.   To learn more about what you can do with MyChart, go to [email or web address].

## 2024-07-08 ENCOUNTER — Ambulatory Visit
